# Patient Record
Sex: Male | Born: 1935
Health system: Southern US, Community
[De-identification: ages and names within clinical notes are randomized; demographics above are authoritative.]

## PROBLEM LIST (undated history)

## (undated) DIAGNOSIS — L570 Actinic keratosis: Secondary | ICD-10-CM

## (undated) DIAGNOSIS — I10 Essential (primary) hypertension: Secondary | ICD-10-CM

## (undated) DIAGNOSIS — R001 Bradycardia, unspecified: Secondary | ICD-10-CM

## (undated) DIAGNOSIS — E039 Hypothyroidism, unspecified: Secondary | ICD-10-CM

## (undated) DIAGNOSIS — D649 Anemia, unspecified: Secondary | ICD-10-CM

## (undated) DIAGNOSIS — N183 Chronic kidney disease, stage 3 unspecified: Secondary | ICD-10-CM

## (undated) DIAGNOSIS — E871 Hypo-osmolality and hyponatremia: Secondary | ICD-10-CM

## (undated) DIAGNOSIS — C642 Malignant neoplasm of left kidney, except renal pelvis: Secondary | ICD-10-CM

## (undated) DIAGNOSIS — F32A Depression, unspecified: Secondary | ICD-10-CM

## (undated) DIAGNOSIS — E785 Hyperlipidemia, unspecified: Secondary | ICD-10-CM

## (undated) DIAGNOSIS — I509 Heart failure, unspecified: Secondary | ICD-10-CM

## (undated) DIAGNOSIS — I714 Abdominal aortic aneurysm, without rupture, unspecified: Secondary | ICD-10-CM

## (undated) DIAGNOSIS — C61 Malignant neoplasm of prostate: Secondary | ICD-10-CM

## (undated) DIAGNOSIS — I4891 Unspecified atrial fibrillation: Secondary | ICD-10-CM

## (undated) DIAGNOSIS — I495 Sick sinus syndrome: Secondary | ICD-10-CM

## (undated) DIAGNOSIS — I700 Atherosclerosis of aorta: Secondary | ICD-10-CM

## (undated) DIAGNOSIS — E538 Deficiency of other specified B group vitamins: Secondary | ICD-10-CM

## (undated) DIAGNOSIS — M1990 Unspecified osteoarthritis, unspecified site: Secondary | ICD-10-CM

## (undated) HISTORY — DX: Anemia, unspecified: D64.9

## (undated) HISTORY — DX: Depression, unspecified: F32.A

## (undated) HISTORY — DX: Atherosclerosis of aorta: I70.0

## (undated) HISTORY — PX: EXCISIONAL HEMORRHOIDECTOMY: SHX1541

## (undated) HISTORY — DX: Abdominal aortic aneurysm, without rupture: I71.4

## (undated) HISTORY — DX: Chronic kidney disease, stage 3 unspecified: N18.30

## (undated) HISTORY — PX: COLONOSCOPY: SHX174

## (undated) HISTORY — PX: INSERTION PROSTATE RADIATION SEED: SUR718

## (undated) HISTORY — DX: Abdominal aortic aneurysm, without rupture, unspecified: I71.40

## (undated) HISTORY — DX: Sick sinus syndrome: I49.5

## (undated) HISTORY — DX: Unspecified atrial fibrillation: I48.91

## (undated) HISTORY — DX: Hypo-osmolality and hyponatremia: E87.1

## (undated) HISTORY — DX: Actinic keratosis: L57.0

## (undated) HISTORY — DX: Heart failure, unspecified: I50.9

## (undated) HISTORY — PX: CATARACT EXTRACTION, BILATERAL: SHX1313

## (undated) HISTORY — DX: Hypothyroidism, unspecified: E03.9

## (undated) HISTORY — DX: Chronic kidney disease, stage 3 (moderate): N18.3

---

## 1999-05-08 DIAGNOSIS — C61 Malignant neoplasm of prostate: Secondary | ICD-10-CM

## 1999-05-08 HISTORY — DX: Malignant neoplasm of prostate: C61

## 2004-06-27 ENCOUNTER — Ambulatory Visit: Payer: Self-pay

## 2008-06-15 ENCOUNTER — Ambulatory Visit: Payer: Self-pay | Admitting: Ophthalmology

## 2008-06-28 ENCOUNTER — Ambulatory Visit: Payer: Self-pay | Admitting: Ophthalmology

## 2010-07-10 ENCOUNTER — Ambulatory Visit: Payer: Self-pay | Admitting: Gastroenterology

## 2010-10-25 ENCOUNTER — Ambulatory Visit: Payer: Self-pay | Admitting: Ophthalmology

## 2010-11-06 ENCOUNTER — Ambulatory Visit: Payer: Self-pay | Admitting: Ophthalmology

## 2015-06-28 ENCOUNTER — Ambulatory Visit
Admission: RE | Admit: 2015-06-28 | Discharge: 2015-06-28 | Disposition: A | Discharge status: Home / Self Care | Payer: Medicare Other | Source: Ambulatory Visit | Attending: Infectious Diseases | Admitting: Infectious Diseases

## 2015-06-28 ENCOUNTER — Other Ambulatory Visit: Payer: Self-pay | Admitting: Infectious Diseases

## 2015-06-28 DIAGNOSIS — G252 Other specified forms of tremor: Secondary | ICD-10-CM

## 2015-06-28 DIAGNOSIS — R270 Ataxia, unspecified: Secondary | ICD-10-CM | POA: Insufficient documentation

## 2015-06-28 DIAGNOSIS — J329 Chronic sinusitis, unspecified: Secondary | ICD-10-CM | POA: Insufficient documentation

## 2015-06-29 ENCOUNTER — Encounter: Payer: Self-pay | Admitting: Internal Medicine

## 2015-06-29 ENCOUNTER — Inpatient Hospital Stay
Admission: AD | Admit: 2015-06-29 | Discharge: 2015-07-02 | DRG: 371 | Disposition: A | Discharge status: Skilled Nursing Facility | Payer: Medicare Other | Source: Ambulatory Visit | Attending: Specialist | Admitting: Specialist

## 2015-06-29 DIAGNOSIS — N170 Acute kidney failure with tubular necrosis: Secondary | ICD-10-CM | POA: Diagnosis present

## 2015-06-29 DIAGNOSIS — E785 Hyperlipidemia, unspecified: Secondary | ICD-10-CM | POA: Diagnosis present

## 2015-06-29 DIAGNOSIS — M1990 Unspecified osteoarthritis, unspecified site: Secondary | ICD-10-CM | POA: Diagnosis present

## 2015-06-29 DIAGNOSIS — Z8546 Personal history of malignant neoplasm of prostate: Secondary | ICD-10-CM | POA: Diagnosis not present

## 2015-06-29 DIAGNOSIS — E860 Dehydration: Secondary | ICD-10-CM | POA: Diagnosis present

## 2015-06-29 DIAGNOSIS — I10 Essential (primary) hypertension: Secondary | ICD-10-CM | POA: Diagnosis present

## 2015-06-29 DIAGNOSIS — R740 Nonspecific elevation of levels of transaminase and lactic acid dehydrogenase [LDH]: Secondary | ICD-10-CM | POA: Diagnosis present

## 2015-06-29 DIAGNOSIS — A0472 Enterocolitis due to Clostridium difficile, not specified as recurrent: Secondary | ICD-10-CM

## 2015-06-29 DIAGNOSIS — D72829 Elevated white blood cell count, unspecified: Secondary | ICD-10-CM

## 2015-06-29 DIAGNOSIS — N179 Acute kidney failure, unspecified: Secondary | ICD-10-CM

## 2015-06-29 DIAGNOSIS — A047 Enterocolitis due to Clostridium difficile: Secondary | ICD-10-CM | POA: Diagnosis present

## 2015-06-29 DIAGNOSIS — R7401 Elevation of levels of liver transaminase levels: Secondary | ICD-10-CM

## 2015-06-29 DIAGNOSIS — E876 Hypokalemia: Secondary | ICD-10-CM | POA: Diagnosis present

## 2015-06-29 DIAGNOSIS — E538 Deficiency of other specified B group vitamins: Secondary | ICD-10-CM | POA: Diagnosis present

## 2015-06-29 DIAGNOSIS — I4891 Unspecified atrial fibrillation: Secondary | ICD-10-CM | POA: Diagnosis not present

## 2015-06-29 DIAGNOSIS — E871 Hypo-osmolality and hyponatremia: Secondary | ICD-10-CM | POA: Diagnosis present

## 2015-06-29 DIAGNOSIS — J0100 Acute maxillary sinusitis, unspecified: Secondary | ICD-10-CM | POA: Diagnosis present

## 2015-06-29 DIAGNOSIS — E039 Hypothyroidism, unspecified: Secondary | ICD-10-CM | POA: Diagnosis present

## 2015-06-29 DIAGNOSIS — D649 Anemia, unspecified: Secondary | ICD-10-CM | POA: Diagnosis present

## 2015-06-29 DIAGNOSIS — R531 Weakness: Secondary | ICD-10-CM

## 2015-06-29 HISTORY — DX: Deficiency of other specified B group vitamins: E53.8

## 2015-06-29 HISTORY — DX: Unspecified osteoarthritis, unspecified site: M19.90

## 2015-06-29 HISTORY — DX: Essential (primary) hypertension: I10

## 2015-06-29 HISTORY — DX: Hyperlipidemia, unspecified: E78.5

## 2015-06-29 HISTORY — DX: Malignant neoplasm of prostate: C61

## 2015-06-29 LAB — GASTROINTESTINAL PANEL BY PCR, STOOL (REPLACES STOOL CULTURE)
Adenovirus F40/41: NOT DETECTED
Astrovirus: NOT DETECTED
CAMPYLOBACTER SPECIES: NOT DETECTED
CRYPTOSPORIDIUM: NOT DETECTED
Cyclospora cayetanensis: NOT DETECTED
E. coli O157: NOT DETECTED
ENTEROPATHOGENIC E COLI (EPEC): NOT DETECTED
Entamoeba histolytica: NOT DETECTED
Enteroaggregative E coli (EAEC): NOT DETECTED
Enterotoxigenic E coli (ETEC): NOT DETECTED
Giardia lamblia: NOT DETECTED
Norovirus GI/GII: NOT DETECTED
PLESIMONAS SHIGELLOIDES: NOT DETECTED
ROTAVIRUS A: NOT DETECTED
SALMONELLA SPECIES: NOT DETECTED
SHIGA LIKE TOXIN PRODUCING E COLI (STEC): NOT DETECTED
SHIGELLA/ENTEROINVASIVE E COLI (EIEC): NOT DETECTED
Sapovirus (I, II, IV, and V): NOT DETECTED
Vibrio cholerae: NOT DETECTED
Vibrio species: NOT DETECTED
YERSINIA ENTEROCOLITICA: NOT DETECTED

## 2015-06-29 LAB — BASIC METABOLIC PANEL
Anion gap: 11 (ref 5–15)
BUN: 35 mg/dL — ABNORMAL HIGH (ref 6–20)
CHLORIDE: 92 mmol/L — AB (ref 101–111)
CO2: 22 mmol/L (ref 22–32)
Calcium: 8.2 mg/dL — ABNORMAL LOW (ref 8.9–10.3)
Creatinine, Ser: 1.46 mg/dL — ABNORMAL HIGH (ref 0.61–1.24)
GFR calc non Af Amer: 41 mL/min — ABNORMAL LOW (ref >60)
GFR, EST AFRICAN AMERICAN: 48 mL/min — AB (ref >60)
Glucose, Bld: 110 mg/dL — ABNORMAL HIGH (ref 65–99)
POTASSIUM: 3.7 mmol/L (ref 3.5–5.1)
SODIUM: 125 mmol/L — AB (ref 135–145)

## 2015-06-29 LAB — CBC
HCT: 34.5 % — ABNORMAL LOW (ref 40.0–52.0)
HEMOGLOBIN: 12 g/dL — AB (ref 13.0–18.0)
MCH: 31.6 pg (ref 26.0–34.0)
MCHC: 34.8 g/dL (ref 32.0–36.0)
MCV: 90.8 fL (ref 80.0–100.0)
Platelets: 229 10*3/uL (ref 150–440)
RBC: 3.8 MIL/uL — AB (ref 4.40–5.90)
RDW: 12.7 % (ref 11.5–14.5)
WBC: 12.1 10*3/uL — ABNORMAL HIGH (ref 3.8–10.6)

## 2015-06-29 LAB — C DIFFICILE QUICK SCREEN W PCR REFLEX
C DIFFICILE (CDIFF) TOXIN: NEGATIVE
C Diff antigen: POSITIVE — AB

## 2015-06-29 LAB — CLOSTRIDIUM DIFFICILE BY PCR: Toxigenic C. Difficile by PCR: POSITIVE — AB

## 2015-06-29 MED ORDER — SODIUM CHLORIDE 0.9% FLUSH
3.0000 mL | Freq: Two times a day (BID) | INTRAVENOUS | Status: DC
  Administered 2015-06-29 – 2015-07-02 (×3): 3 mL via INTRAVENOUS

## 2015-06-29 MED ORDER — POLYETHYLENE GLYCOL 3350 17 G PO PACK: 17.0000 g | PACK | Freq: Every day | ORAL | Status: DC | PRN

## 2015-06-29 MED ORDER — ACETAMINOPHEN 650 MG RE SUPP: 650.0000 mg | Freq: Four times a day (QID) | RECTAL | Status: DC | PRN

## 2015-06-29 MED ORDER — ENOXAPARIN SODIUM 40 MG/0.4ML ~~LOC~~ SOLN
40.0000 mg | SUBCUTANEOUS | Status: DC
  Administered 2015-06-29 – 2015-07-01 (×3): 40 mg via SUBCUTANEOUS
  Filled 2015-06-29 (×3): qty 0.4

## 2015-06-29 MED ORDER — AZITHROMYCIN 250 MG PO TABS: 500.0000 mg | ORAL_TABLET | Freq: Every day | ORAL | Status: DC

## 2015-06-29 MED ORDER — SODIUM CHLORIDE 0.9 % IV SOLN
INTRAVENOUS | Status: DC
  Administered 2015-06-29 (×2): via INTRAVENOUS

## 2015-06-29 MED ORDER — ONDANSETRON HCL 4 MG/2ML IJ SOLN: 4.0000 mg | Freq: Four times a day (QID) | INTRAMUSCULAR | Status: DC | PRN

## 2015-06-29 MED ORDER — AZITHROMYCIN 250 MG PO TABS: 250.0000 mg | ORAL_TABLET | Freq: Every day | ORAL | Status: DC

## 2015-06-29 MED ORDER — ONDANSETRON HCL 4 MG PO TABS: 4.0000 mg | ORAL_TABLET | Freq: Four times a day (QID) | ORAL | Status: DC | PRN

## 2015-06-29 MED ORDER — ACETAMINOPHEN 325 MG PO TABS: 650.0000 mg | ORAL_TABLET | Freq: Four times a day (QID) | ORAL | Status: DC | PRN

## 2015-06-29 NOTE — H&P (Addendum)
[place] [facility] [place] NAME: Frank Bartlett    MR#:  [id number]  DATE OF BIRTH:  08/04/1925  DATE OF ADMISSION:  06/29/2015  PRIMARY CARE PHYSICIAN: Adrian Prows, MD   REQUESTING/REFERRING PHYSICIAN: Dr. Adrian Prows  CHIEF COMPLAINT:  No chief complaint on file.   HISTORY OF PRESENT ILLNESS:  Frank Bartlett  is a 82 y.o. male with a known history of HTN, Hyperlipidemia, arthritis sent in from PCP office secondary to significant weakness and dehydration. Sodium of 124 on labs yesterday. Symptoms started about last week, patient started feeling sick denies any fevers and chills. Nauseated and vomiting for couple of days and occasional loose stools. Generalized body aches. He has been visiting his wife who is in the Alzheimer's unit of [facility] [place]. No other sick contacts. Has been extremely weak, unable to even get up from the bed, dizzy and so went to see PCP yesterday. Labs ordered yesterday showing sodium of 124, elevated white count of 13.9 and also CT head with maxillary sinusitis. Patient was encouraged to drink more water by mouth, however he was unable to achieve that and was getting much worse. So he was asked to come in for IV fluids.  PAST MEDICAL HISTORY:   Past Medical History  Diagnosis Date  . Hypertension   . Hyperlipidemia   . Osteoarthritis   . Prostate cancer ([place])   . B12 deficiency     PAST SURGICAL HISTORY:   Past Surgical History  Procedure Laterality Date  . Cataract extraction, bilateral    . Excisional hemorrhoidectomy    . Insertion prostate radiation seed      SOCIAL HISTORY:   Social History  Substance Use Topics  . Smoking status: Never Smoker   . Smokeless tobacco: Not on file  . Alcohol Use: No    FAMILY HISTORY:   Family History  Problem Relation Age of Onset  . Hypertension Mother   . Breast cancer Mother     DRUG ALLERGIES:  No Known Allergies  REVIEW OF SYSTEMS:    Review of Systems  Constitutional: Positive for malaise/fatigue. Negative for fever, chills and weight loss.  HENT: Negative for ear discharge, ear pain, hearing loss and nosebleeds.   Eyes: Negative for blurred vision, double vision and photophobia.  Respiratory: Negative for cough, hemoptysis, shortness of breath and wheezing.   Cardiovascular: Negative for chest pain, palpitations, orthopnea and leg swelling.  Gastrointestinal: Negative for heartburn, nausea, vomiting, abdominal pain, diarrhea, constipation and melena.  Genitourinary: Negative for dysuria, urgency, frequency and hematuria.  Musculoskeletal: Positive for myalgias. Negative for back pain and neck pain.  Skin: Negative for rash.  Neurological: Positive for tremors. Negative for dizziness, tingling, sensory change, speech change, focal weakness and headaches.  Endo/Heme/Allergies: Does not bruise/bleed easily.  Psychiatric/Behavioral: Negative for depression.    MEDICATIONS AT HOME:   Prior to Admission medications   Not on File      VITAL SIGNS:  Blood pressure 90/61, pulse 85, temperature 97.6 F (36.4 C), temperature source Oral, resp. rate 20, SpO2 98 %.  PHYSICAL EXAMINATION:   Physical Exam  GENERAL:  82 y.o.-year-old patient lying in the bed with no acute distress.  EYES: Pupils equal, round, reactive to light and accommodation. No scleral icterus. Extraocular muscles intact.  HEENT: Head atraumatic, normocephalic. Oropharynx and nasopharynx clear.  NECK:  Supple, no jugular venous distention. No thyroid enlargement, no tenderness.  LUNGS: Normal breath sounds bilaterally, no wheezing, rales,rhonchi or crepitation.  No use of accessory muscles of respiration.  CARDIOVASCULAR: S1, S2 normal. No rubs, or gallops. 3/6 systolic murmur ABDOMEN: Soft, nontender, nondistended. Bowel sounds present. No organomegaly or mass.  EXTREMITIES: No pedal edema, cyanosis, or clubbing.  NEUROLOGIC: Cranial nerves II  through XII are intact. Muscle strength 5/5 in all extremities. Sensation intact. Gait not checked.  PSYCHIATRIC: The patient is alert and oriented x 3.  SKIN: No obvious rash, lesion, or ulcer.   LABORATORY PANEL:   CBC No results for input(s): WBC, HGB, HCT, PLT in the last 168 hours. ------------------------------------------------------------------------------------------------------------------  Chemistries  No results for input(s): NA, K, CL, CO2, GLUCOSE, BUN, CREATININE, CALCIUM, MG, AST, ALT, ALKPHOS, BILITOT in the last 168 hours.  Invalid input(s): GFRCGP ------------------------------------------------------------------------------------------------------------------  Cardiac Enzymes No results for input(s): TROPONINI in the last 168 hours. ------------------------------------------------------------------------------------------------------------------  RADIOLOGY:  Ct Head Wo Contrast  06/28/2015  CLINICAL DATA:  Weakness, shaking, dizziness.  Unsteady gait. EXAM: CT HEAD WITHOUT CONTRAST TECHNIQUE: Contiguous axial images were obtained from the base of the skull through the vertex without intravenous contrast. COMPARISON:  None. FINDINGS: There is atrophy and chronic small vessel disease changes. Short air-fluid level in the right maxillary sinus. Mucosal thickening in the ethmoid air cells. Mastoid air cells are clear. IMPRESSION: No acute intracranial abnormality. Atrophy, chronic microvascular disease. Mild ethmoid chronic sinusitis. Short air-fluid level in right maxillary sinus may reflect acute sinusitis. Electronically Signed   By: Rolm Baptise M.D.   On: 06/28/2015 11:09    EKG:  No orders found for this or any previous visit.  IMPRESSION AND PLAN:   Frank Bartlett  is a 82 y.o. male with a known history of HTN, Hyperlipidemia, arthritis sent in from PCP office secondary to significant weakness and dehydration. Sodium of 124 on labs yesterday.  #1 Hyponatremia-  IV fluids, hypovolemic hyponatremia Recheck Hold HCTZ  #2 ARF- pre renal, IV fluids, monitor  #2 Weakness- likely from hyponatremia, physical therapy consulted  #3 Acute right maxillary sinusitis- continue azithromycin until 07/02/15  #4 HTN- hold HCTZ as low sodium, continue losartan  #5 Hypothyroidism- on synthroid  #6 DVT prophylaxis- lovenox  Physical Therapy consulted.     All the records are reviewed and case discussed with ED provider. Management plans discussed with the patient, family and they are in agreement.  CODE STATUS: Full Code  TOTAL TIME TAKING CARE OF THIS PATIENT: 50 minutes.    Gladstone Lighter M.D on 06/29/2015 at 12:58 PM  Between 7am to 6pm - Pager - [phone]  After 6pm go to [email or web address] - password [email or web address] [facility]  [facility]  Office  [phone]  CC: Primary care physician; Adrian Prows, MD

## 2015-06-30 LAB — URINALYSIS COMPLETE WITH MICROSCOPIC (ARMC ONLY)
Bilirubin Urine: NEGATIVE
Glucose, UA: NEGATIVE mg/dL
Ketones, ur: NEGATIVE mg/dL
LEUKOCYTES UA: NEGATIVE
Nitrite: NEGATIVE
PROTEIN: 30 mg/dL — AB
SPECIFIC GRAVITY, URINE: 1.015 (ref 1.005–1.030)
pH: 6 (ref 5.0–8.0)

## 2015-06-30 LAB — COMPREHENSIVE METABOLIC PANEL
ALBUMIN: 2.6 g/dL — AB (ref 3.5–5.0)
ALT: 25 U/L (ref 17–63)
ANION GAP: 10 (ref 5–15)
AST: 72 U/L — ABNORMAL HIGH (ref 15–41)
Alkaline Phosphatase: 58 U/L (ref 38–126)
BUN: 35 mg/dL — ABNORMAL HIGH (ref 6–20)
CHLORIDE: 100 mmol/L — AB (ref 101–111)
CO2: 22 mmol/L (ref 22–32)
Calcium: 7.9 mg/dL — ABNORMAL LOW (ref 8.9–10.3)
Creatinine, Ser: 1.23 mg/dL (ref 0.61–1.24)
GFR calc non Af Amer: 51 mL/min — ABNORMAL LOW (ref >60)
GFR, EST AFRICAN AMERICAN: 59 mL/min — AB (ref >60)
GLUCOSE: 110 mg/dL — AB (ref 65–99)
Potassium: 3.3 mmol/L — ABNORMAL LOW (ref 3.5–5.1)
SODIUM: 132 mmol/L — AB (ref 135–145)
Total Bilirubin: 0.6 mg/dL (ref 0.3–1.2)
Total Protein: 6.3 g/dL — ABNORMAL LOW (ref 6.5–8.1)

## 2015-06-30 LAB — BASIC METABOLIC PANEL
Anion gap: 11 (ref 5–15)
BUN: 36 mg/dL — ABNORMAL HIGH (ref 6–20)
CHLORIDE: 97 mmol/L — AB (ref 101–111)
CO2: 21 mmol/L — ABNORMAL LOW (ref 22–32)
CREATININE: 1.28 mg/dL — AB (ref 0.61–1.24)
Calcium: 7.7 mg/dL — ABNORMAL LOW (ref 8.9–10.3)
GFR calc non Af Amer: 49 mL/min — ABNORMAL LOW (ref >60)
GFR, EST AFRICAN AMERICAN: 56 mL/min — AB (ref >60)
Glucose, Bld: 96 mg/dL (ref 65–99)
POTASSIUM: 3.1 mmol/L — AB (ref 3.5–5.1)
SODIUM: 129 mmol/L — AB (ref 135–145)

## 2015-06-30 LAB — CBC
HEMATOCRIT: 32.7 % — AB (ref 40.0–52.0)
Hemoglobin: 11.5 g/dL — ABNORMAL LOW (ref 13.0–18.0)
MCH: 32 pg (ref 26.0–34.0)
MCHC: 35.1 g/dL (ref 32.0–36.0)
MCV: 91 fL (ref 80.0–100.0)
PLATELETS: 235 10*3/uL (ref 150–440)
RBC: 3.6 MIL/uL — AB (ref 4.40–5.90)
RDW: 12.8 % (ref 11.5–14.5)
WBC: 9.5 10*3/uL (ref 3.8–10.6)

## 2015-06-30 MED ORDER — POTASSIUM CHLORIDE IN NACL 20-0.9 MEQ/L-% IV SOLN
INTRAVENOUS | Status: DC
  Administered 2015-06-30 – 2015-07-01 (×4): via INTRAVENOUS
  Filled 2015-06-30 (×5): qty 1000

## 2015-06-30 MED ORDER — LEVOTHYROXINE SODIUM 88 MCG PO TABS
88.0000 ug | ORAL_TABLET | Freq: Every day | ORAL | Status: DC
  Administered 2015-07-01 – 2015-07-02 (×2): 88 ug via ORAL
  Filled 2015-06-30 (×2): qty 1

## 2015-06-30 MED ORDER — VANCOMYCIN 50 MG/ML ORAL SOLUTION
125.0000 mg | Freq: Four times a day (QID) | ORAL | Status: DC
  Administered 2015-06-30 – 2015-07-02 (×8): 125 mg via ORAL
  Filled 2015-06-30 (×10): qty 2.5

## 2015-06-30 MED ORDER — METRONIDAZOLE 500 MG PO TABS
500.0000 mg | ORAL_TABLET | Freq: Three times a day (TID) | ORAL | Status: DC
  Administered 2015-06-30 (×2): 500 mg via ORAL
  Filled 2015-06-30 (×2): qty 1

## 2015-06-30 MED ORDER — ENSURE ENLIVE PO LIQD
237.0000 mL | Freq: Two times a day (BID) | ORAL | Status: DC
  Administered 2015-06-30 – 2015-07-02 (×4): 237 mL via ORAL

## 2015-06-30 NOTE — Progress Notes (Signed)
Initial Nutrition Assessment   INTERVENTION:   Meals and Snacks: Cater to patient preferences Medical Food Supplement Therapy: will recommend Ensure Enlive po BID, each supplement provides 350 kcal and 20 grams of protein   NUTRITION DIAGNOSIS:   Inadequate oral intake related to acute illness as evidenced by per patient/family report.  GOAL:   Patient will meet greater than or equal to 90% of their needs  MONITOR:    (Energy Intake, Electrolyte and Renal Profile, Anthropoemetrics, Digestive System)  REASON FOR ASSESSMENT:   Malnutrition Screening Tool    ASSESSMENT:   Pt admitted with hyponatremia, acute renal failure and c. diff positive.  Past Medical History  Diagnosis Date  . Hypertension   . Hyperlipidemia   . Osteoarthritis   . Prostate cancer ([place])   . B12 deficiency      Diet Order:  Diet regular Room service appropriate?: Yes; Fluid consistency:: Thin    Current Nutrition: Pt ate 40% of breakfast tray this am including all of yogurt and banana, bites of eggs, no toast, juice or milk. Pt reports not wanting to eat as it makes him have diarrhea.   Food/Nutrition-Related History: Pt reports appetite has been poor since the weekend. Pt reports previously appetite was doing ok. Pt reports drinking some of wife's left over Ensure occasionally but not consistently.    Scheduled Medications:  . enoxaparin (LOVENOX) injection  40 mg Subcutaneous Q24H  . feeding supplement (ENSURE ENLIVE)  237 mL Oral BID WC  . metroNIDAZOLE  500 mg Oral 3 times per day  . sodium chloride flush  3 mL Intravenous Q12H    Continuous Medications:  . 0.9 % NaCl with KCl 20 mEq / L 100 mL/hr at 06/30/15 0824     Electrolyte/Renal Profile and Glucose Profile:   Recent Labs Lab 06/29/15 1225 06/30/15 0502  NA 125* 129*  K 3.7 3.1*  CL 92* 97*  CO2 22 21*  BUN 35* 36*  CREATININE 1.46* 1.28*  CALCIUM 8.2* 7.7*  GLUCOSE 110* 96   Protein Profile: No results for  input(s): ALBUMIN in the last 168 hours.  Gastrointestinal Profile: Last BM: 06/30/2015  Loose diarrhea per documentation   Nutrition-Focused Physical Exam Findings:  Unable to complete Nutrition-Focused physical exam at this time.    Weight Change: Pt reports stable weight around 155-156lbs.   Skin:  Reviewed, no issues   Height:   Ht Readings from Last 1 Encounters:  06/29/15 5\' 9"  (1.753 m)    Weight:   Wt Readings from Last 1 Encounters:  06/29/15 154 lb (69.854 kg)    BMI:  Body mass index is 22.73 kg/(m^2).  Estimated Nutritional Needs:   Kcal:  BEE: 1360kcals, TEE: (IF 1.1-1.3)(AF 1.2) 1794-2120kcals  Protein:  70-84g protein (1.0-1.2g/kg)  Fluid:  1748-2033mL of fluid (25-72mL/kg)  EDUCATION NEEDS:   No education needs identified at this time   [place], RD, LDN Pager [phone] Weekend/On-Call Pager [phone]

## 2015-06-30 NOTE — Evaluation (Signed)
Physical Therapy Evaluation Patient Details Name: Frank Bartlett MRN: [id number] DOB: 05-Aug-1925 Today's Date: 06/30/2015   History of Present Illness  Pt is admitted for complaints of weakness and dehydration. Pt with history of HTN and arthritis. Pt now with positive Cdiff. Pt currently lives at [place] at [facility].   Clinical Impression  Pt is a pleasant 82 year old male who was admitted for weakness and dehydration. Pt performs bed mobility with min assist, transfers with mod assist, and ambulation with min assist and rw. Pt demonstrates poor balance with position changes and needs assist for upright posture. Pt demonstrates deficits with balance/endurance/strength. Would benefit from skilled PT to address above deficits and promote optimal return to PLOF; recommend transition to STR upon discharge from acute hospitalization.       Follow Up Recommendations SNF    Equipment Recommendations  Rolling walker with 5" wheels    Recommendations for Other Services       Precautions / Restrictions Precautions Precautions: Fall Restrictions Weight Bearing Restrictions: No      Mobility  Bed Mobility Overal bed mobility: Needs Assistance Bed Mobility: Supine to Sit     Supine to sit: Min assist     General bed mobility comments: assist for trunk mobility and scooting towards EOB. Once seated at EOB, pt able to sit with supervision  Transfers Overall transfer level: Needs assistance Equipment used: Rolling walker (2 wheeled) Transfers: Sit to/from Stand Sit to Stand: Mod assist         General transfer comment: assist for anterior translation as pt has difficulty from lower surface. Once standing, cues for upright posture.  Ambulation/Gait Ambulation/Gait assistance: Min assist Ambulation Distance (Feet): 20 Feet Assistive device: Rolling walker (2 wheeled) Gait Pattern/deviations: Step-to pattern     General Gait Details: ambulated with step to gait pattern and  slow speed. Pt able to navigate rw and obstacles, however needs assist for balance  Stairs            Wheelchair Mobility    Modified Rankin (Stroke Patients Only)       Balance                                             Pertinent Vitals/Pain Pain Assessment: No/denies pain    Home Living Family/patient expects to be discharged to:: Private residence Living Arrangements: Alone Available Help at Discharge:  (has son who is staying through the weekend) Type of Home: Independent living facility Home Access: Level entry     Home Layout: One level Home Equipment: None Additional Comments: WIfe lives at memory care unit, however is hopsitalized at this time as well    Prior Function Level of Independence: Independent               Hand Dominance        Extremity/Trunk Assessment   Upper Extremity Assessment: Generalized weakness (grossly 4/5)           Lower Extremity Assessment: Generalized weakness (grossly 4/5)         Communication   Communication: No difficulties  Cognition Arousal/Alertness: Awake/alert Behavior During Therapy: WFL for tasks assessed/performed Overall Cognitive Status: Within Functional Limits for tasks assessed                      General Comments      Exercises  Other Exercises Other Exercises: Supine ther-ex performed including B LE SLRs, hip abd/add, and seated alt. marching. All ther-ex performed x 10 reps with min assist for correct technique. Exercises written on board and reviewed with pt Other Exercises: Pt ambulated to [facility] with positive BM. Pt needs mod assist for hygiene secondary to poor balance.      Assessment/Plan    PT Assessment Patient needs continued PT services  PT Diagnosis Difficulty walking;Generalized weakness   PT Problem List Decreased strength;Decreased mobility;Decreased knowledge of use of DME;Decreased balance  PT Treatment Interventions Gait  training;Therapeutic exercise;DME instruction;Balance training   PT Goals (Current goals can be found in the Care Plan section) Acute Rehab PT Goals Patient Stated Goal: to go home PT Goal Formulation: With patient Time For Goal Achievement: 07/14/15 Potential to Achieve Goals: Good    Frequency Min 2X/week   Barriers to discharge Decreased caregiver support      Co-evaluation               End of Session Equipment Utilized During Treatment: Gait belt Activity Tolerance: Patient limited by fatigue Patient left: in chair;with chair alarm set Nurse Communication: Mobility status         Time: [id number] PT Time Calculation (min) (ACUTE ONLY): 36 min   Charges:   PT Evaluation $PT Eval Moderate Complexity: 1 Procedure PT Treatments $Therapeutic Exercise: 8-22 mins $Therapeutic Activity: 8-22 mins   PT G Codes:        Frank Bartlett 07/07/2015, 3:24 PM Greggory Stallion, PT, DPT [phone]

## 2015-06-30 NOTE — Plan of Care (Signed)
Problem: Bowel/Gastric: Goal: Will not experience complications related to bowel motility Outcome: Progressing Pt OOB to chair today, son visits, pt with no noted complaints of pain, no distress or discomfort noted

## 2015-06-30 NOTE — Clinical Social Work Note (Signed)
Clinical Social Work Assessment  Patient Details  Name: Frank Bartlett MRN: [id number] Date of Birth: 07-Dec-1925  Date of referral:  06/30/15               Reason for consult:  Facility Placement                Permission sought to share information with:  Family Supports Permission granted to share information::  Yes, Verbal Permission Granted  Name::     son, [name]   Housing/Transportation Living arrangements for the past 2 months:  [facility]) Source of Information:  Patient, Adult Children Patient Interpreter Needed:  None Criminal Activity/Legal Involvement Pertinent to Current Situation/Hospitalization:  No - Comment as needed Significant Relationships:  Adult Children, Spouse Lives with:  Self Do you feel safe going back to the place where you live?  Yes Need for family participation in patient care:  No (Coment)  Care giving concerns:  Pt lives alone and adult children live out of state.   Social Worker assessment / plan:  CSW met with pt to address consult. CSW introduced herself and explained role of social work. PT eval is pending. Pt lives at [facility] in the [place]. Pt has [facility] at [facility] for almost [facility] years. Pt's wife now lives at the [place] at [facility]. CSW explained that [facility] at [facility] if it is needed. CSW will continue to follow.   Employment status:  Retired Nurse, adult PT Recommendations:  Not assessed at this time Information / Referral to community resources:  Other (Comment Required) [facility])  Patient/Family's Response to care:  Pt was appreciative of CSW support.   Patient/Family's Understanding of and Emotional Response to Diagnosis, Current Treatment, and Prognosis:   Pt understands that he should go to [facility] should he need it.   Emotional Assessment Appearance:  Appears stated age Attitude/Demeanor/Rapport:  Other  (Appropriate) Affect (typically observed):  Accepting, Adaptable, Pleasant Orientation:  Oriented to Self, Oriented to Place, Oriented to  Time, Oriented to Situation Alcohol / Substance use:  Never Used Psych involvement (Current and /or in the community):  No (Comment)  Discharge Needs  Concerns to be addressed:  Adjustment to Illness Readmission within the last 30 days:  No Current discharge risk:  None Barriers to Discharge:  Continued Medical Work up   [employer], LCSW 06/30/2015, 3:48 PM

## 2015-06-30 NOTE — Progress Notes (Signed)
[place] at [place] NAME: Frank Bartlett    MR#:  [id number]  DATE OF BIRTH:  1925-06-18  SUBJECTIVE:  CHIEF COMPLAINT:  No chief complaint on file.  patient is a 82 year old male with known history of essential hypertension, hyperlipidemia, arthritis, who was sent from primary care physician's office due to weakness and dehydration, his sodium level was found to be low at 124. He admitted of intermittent diarrhea. Patient was initiated on IV fluids and his sodium level improved to 129, he feels a little bit more comfortable today, his kidney function also has improved. His stool was checked for C. difficile and it was positive, patient was initiated on Flagyl and enteric precautions.  Patient admits of lower abdominal pain now with defecation.  Review of Systems  Gastrointestinal: Positive for abdominal pain and diarrhea.    VITAL SIGNS: Blood pressure 117/69, pulse 67, temperature 98.6 F (37 C), temperature source Oral, resp. rate 18, height 5\' 9"  (1.753 m), weight 69.854 kg (154 lb), SpO2 96 %.  PHYSICAL EXAMINATION:   GENERAL:  82 y.o.-year-old patient lying in the bed with no acute distress.  EYES: Pupils equal, round, reactive to light and accommodation. No scleral icterus. Extraocular muscles intact.  HEENT: Head atraumatic, normocephalic. Oropharynx and nasopharynx clear.  NECK:  Supple, no jugular venous distention. No thyroid enlargement, no tenderness.  LUNGS: Normal breath sounds bilaterally, no wheezing, rales,rhonchi or crepitation. No use of accessory muscles of respiration.  CARDIOVASCULAR: S1, S2 normal. No murmurs, rubs, or gallops.  ABDOMEN: Soft, positive discomfort on palpation in lower abdomen but no rebound or guarding was noted, nondistended. Bowel sounds present. No organomegaly or mass.  EXTREMITIES: No pedal edema, cyanosis, or clubbing.  NEUROLOGIC: Cranial nerves II through XII are intact. Muscle strength  5/5 in all extremities. Sensation intact. Gait not checked.  PSYCHIATRIC: The patient is alert and oriented x 3.  SKIN: No obvious rash, lesion, or ulcer.   ORDERS/RESULTS REVIEWED:   CBC  Recent Labs Lab 06/29/15 1225 06/30/15 0502  WBC 12.1* 9.5  HGB 12.0* 11.5*  HCT 34.5* 32.7*  PLT 229 235  MCV 90.8 91.0  MCH 31.6 32.0  MCHC 34.8 35.1  RDW 12.7 12.8   ------------------------------------------------------------------------------------------------------------------  Chemistries   Recent Labs Lab 06/29/15 1225 06/30/15 0502  NA 125* 129*  K 3.7 3.1*  CL 92* 97*  CO2 22 21*  GLUCOSE 110* 96  BUN 35* 36*  CREATININE 1.46* 1.28*  CALCIUM 8.2* 7.7*   ------------------------------------------------------------------------------------------------------------------ estimated creatinine clearance is 40.2 mL/min (by C-G formula based on Cr of 1.28). ------------------------------------------------------------------------------------------------------------------ No results for input(s): TSH, T4TOTAL, T3FREE, THYROIDAB in the last 72 hours.  Invalid input(s): FREET3  Cardiac Enzymes No results for input(s): CKMB, TROPONINI, MYOGLOBIN in the last 168 hours.  Invalid input(s): CK ------------------------------------------------------------------------------------------------------------------ Invalid input(s): POCBNP ---------------------------------------------------------------------------------------------------------------  RADIOLOGY: No results found.  EKG:  Orders placed or performed during the hospital encounter of 06/29/15  . EKG 12-Lead  . EKG 12-Lead    ASSESSMENT AND PLAN:  Active Problems:   Hyponatremia  #1. Hyponatremia, likely due to dehydration, improved with IV fluid administration, continue to follow. #2. Renal insufficiency, acute, continue patient on IV fluids, follow kidney function closely, improving. Get urinalysis #3. C. difficile  enterocolitis, initiate patient on Flagyl, follow patient clinically.   #4 anemia, get Hemoccult, followed with rehydration, no bleeding noted. #5. Generalized weakness. Get physical therapist involved for further recommendations  Management plans discussed with the patient,  family and they are in agreement.   DRUG ALLERGIES: No Known Allergies  CODE STATUS:     Code Status Orders        Start     Ordered   06/29/15 1207  Full code   Continuous     06/29/15 1206    Code Status History    Date Active Date Inactive Code Status Order ID Comments User Context   This patient has a current code status but no historical code status.    Advance Directive Documentation        Most Recent Value   Type of Advance Directive  Healthcare Power of Attorney, Living will   Pre-existing out of facility DNR order (yellow form or pink MOST form)     "MOST" Form in Place?        TOTAL TIME TAKING CARE OF THIS PATIENT: 40 minutes.    Theodoro Grist M.D on 06/30/2015 at 12:59 PM  Between 7am to 6pm - Pager - [phone]  After 6pm go to [email or web address] - password [email or web address] [facility]  [facility]  Office  [phone]  CC: Primary care physician; Adrian Prows, MD

## 2015-06-30 NOTE — Clinical Social Work Note (Addendum)
Clinical Socail Worker consulted for new SNF. PT eval is pending. Pt is a resident of [facility]. CSW spoke with facility and a room will be available at discharge if it is needed. Full assessment to follow if appropriate. CSW will continue to follow.   [place] Dates, MSW, LCSW Clinical Social Worker  [phone]  ADDENDUM: CSW updated pt's son on disposition. Pt's son shared that he will be following up with [facility] regarding home care options that facility could provide while at the independent living.  [place] Dates, MSW, LCSW

## 2015-06-30 NOTE — Plan of Care (Signed)
Problem: Bowel/Gastric: Goal: Will not experience complications related to bowel motility Outcome: Not Progressing C. Dif +

## 2015-06-30 NOTE — Care Management (Signed)
Admitted to this facility with the diagnosis of hyponatremia. Seen Dr. Ola Spurr yesterday and was admitted to oncology unit. Lives alone at the [place] at [facility]. Wife is in [place] [facility] at [facility]. Son is [name] [phone]). Takes care of basic and instrumental activities of daily living himself.  Sodium level 129 today. Physical therapy evaluation pending. Shelbie Ammons RN MSN CCM Care Management [phone]

## 2015-07-01 DIAGNOSIS — R7401 Elevation of levels of liver transaminase levels: Secondary | ICD-10-CM

## 2015-07-01 DIAGNOSIS — R531 Weakness: Secondary | ICD-10-CM

## 2015-07-01 DIAGNOSIS — D72829 Elevated white blood cell count, unspecified: Secondary | ICD-10-CM

## 2015-07-01 DIAGNOSIS — R740 Nonspecific elevation of levels of transaminase and lactic acid dehydrogenase [LDH]: Secondary | ICD-10-CM

## 2015-07-01 DIAGNOSIS — A0472 Enterocolitis due to Clostridium difficile, not specified as recurrent: Secondary | ICD-10-CM

## 2015-07-01 DIAGNOSIS — N179 Acute kidney failure, unspecified: Secondary | ICD-10-CM

## 2015-07-01 DIAGNOSIS — D649 Anemia, unspecified: Secondary | ICD-10-CM

## 2015-07-01 DIAGNOSIS — E876 Hypokalemia: Secondary | ICD-10-CM

## 2015-07-01 LAB — SODIUM: Sodium: 129 mmol/L — ABNORMAL LOW (ref 135–145)

## 2015-07-01 LAB — HEMOGLOBIN: Hemoglobin: 11.5 g/dL — ABNORMAL LOW (ref 13.0–18.0)

## 2015-07-01 LAB — OSMOLALITY, URINE: Osmolality, Ur: 630 mOsm/kg (ref 300–900)

## 2015-07-01 LAB — MAGNESIUM: MAGNESIUM: 2 mg/dL (ref 1.7–2.4)

## 2015-07-01 MED ORDER — ONDANSETRON HCL 4 MG PO TABS: 4.0000 mg | ORAL_TABLET | Freq: Four times a day (QID) | ORAL | Status: DC | PRN

## 2015-07-01 MED ORDER — ENSURE ENLIVE PO LIQD: 237.0000 mL | Freq: Two times a day (BID) | ORAL | Status: DC

## 2015-07-01 MED ORDER — VANCOMYCIN 50 MG/ML ORAL SOLUTION: 125.0000 mg | Freq: Four times a day (QID) | ORAL | Status: DC

## 2015-07-01 MED ORDER — POTASSIUM CHLORIDE CRYS ER 20 MEQ PO TBCR
40.0000 meq | EXTENDED_RELEASE_TABLET | Freq: Once | ORAL | Status: AC
  Administered 2015-07-01: 40 meq via ORAL
  Filled 2015-07-01: qty 2

## 2015-07-01 NOTE — NC FL2 (Signed)
[place] LEVEL OF CARE SCREENING TOOL     IDENTIFICATION  Patient Name: Frank Bartlett Birthdate: 10-07-1925 Sex: male Admission Date (Current Location): 06/29/2015  [place] and [place] Number:  Engineering geologist and Address:  [facility], [address], [place], [place] [address]      Provider Number: [id number]  Attending Physician Name and Address:  Theodoro Grist, MD  Relative Name and Phone Number:       Current Level of Care: SNF Recommended Level of Care: [place] Prior Approval Number:    Date Approved/Denied:   PASRR Number: [id number]  Discharge Plan: SNF    Current Diagnoses: Patient Active Problem List   Diagnosis Date Noted  . Acute renal failure ([place]) 07/01/2015  . C. difficile colitis 07/01/2015  . Generalized weakness 07/01/2015  . Hypokalemia 07/01/2015  . Elevated transaminase level 07/01/2015  . Leukocytosis 07/01/2015  . Anemia 07/01/2015  . Hyponatremia 06/29/2015    Orientation RESPIRATION BLADDER Height & Weight     Self, Time, Situation, Place  Normal Continent Weight: 154 lb (69.854 kg) Height:  5\' 9"  (175.3 cm)  BEHAVIORAL SYMPTOMS/MOOD NEUROLOGICAL BOWEL NUTRITION STATUS      Continent Diet (Regular Diet, Thin Liquids)  AMBULATORY STATUS COMMUNICATION OF NEEDS Skin   Limited Assist Verbally Normal                       Personal Care Assistance Level of Assistance  Bathing, Feeding, Dressing Bathing Assistance: Limited assistance Feeding assistance: Independent Dressing Assistance: Limited assistance     Functional Limitations Info  Sight, Hearing, Speech Sight Info: Adequate Hearing Info: Adequate Speech Info: Adequate    SPECIAL CARE FACTORS FREQUENCY  PT (By licensed PT)     PT Frequency: 5              Contractures      Additional Factors Info  Code Status, Allergies Code Status Info: Full Code Allergies Info: Allergies            Current Medications (07/01/2015):  This is the current hospital active medication list Current Facility-Administered Medications  Medication Dose Route Frequency Provider Last Rate Last Dose  . acetaminophen (TYLENOL) tablet 650 mg  650 mg Oral Q6H PRN Gladstone Lighter, MD       Or  . acetaminophen (TYLENOL) suppository 650 mg  650 mg Rectal Q6H PRN Gladstone Lighter, MD      . enoxaparin (LOVENOX) injection 40 mg  40 mg Subcutaneous Q24H Gladstone Lighter, MD   40 mg at 06/30/15 2109  . feeding supplement (ENSURE ENLIVE) (ENSURE ENLIVE) liquid 237 mL  237 mL Oral BID WC Theodoro Grist, MD   237 mL at 07/01/15 0826  . levothyroxine (SYNTHROID, LEVOTHROID) tablet 88 mcg  88 mcg Oral QAC breakfast Theodoro Grist, MD   88 mcg at 07/01/15 0829  . ondansetron (ZOFRAN) tablet 4 mg  4 mg Oral Q6H PRN Gladstone Lighter, MD       Or  . ondansetron (ZOFRAN) injection 4 mg  4 mg Intravenous Q6H PRN Gladstone Lighter, MD      . sodium chloride flush (NS) 0.9 % injection 3 mL  3 mL Intravenous Q12H Gladstone Lighter, MD   3 mL at 06/29/15 2204  . vancomycin (VANCOCIN) 50 mg/mL oral solution 125 mg  125 mg Oral QID Theodoro Grist, MD   125 mg at 07/01/15 1358     Discharge Medications: Please see discharge summary  for a list of discharge medications.  Relevant Imaging Results:  Relevant Lab Results:   Additional Information SSN:  [id number]  Darden Dates, LCSW

## 2015-07-01 NOTE — Care Management Important Message (Signed)
Important Message  Patient Details  Name: Frank Bartlett MRN: [id number] Date of Birth: [birth year]   Medicare Important Message Given:  Yes    Shelbie Ammons, RN 07/01/2015, 11:32 AM

## 2015-07-01 NOTE — Progress Notes (Signed)
Dr Ether Griffins made aware of Na 129, new order to cancel discharge

## 2015-07-01 NOTE — Discharge Summary (Addendum)
[place] at [place] NAME: Frank Bartlett    MR#:  [id number]  DATE OF BIRTH:  [birth year]  DATE OF ADMISSION:  06/29/2015 ADMITTING PHYSICIAN: Gladstone Lighter, MD  DATE OF DISCHARGE: 07/02/2015  PRIMARY CARE PHYSICIAN: Adrian Prows, MD     ADMISSION DIAGNOSIS:  Hyponatremia ARF  DISCHARGE DIAGNOSIS:  Principal Problem:   Hyponatremia Active Problems:   Acute renal failure (HCC)   C. difficile colitis   Generalized weakness   Hypokalemia   Elevated transaminase level   Leukocytosis   Anemia   SECONDARY DIAGNOSIS:   Past Medical History  Diagnosis Date  . Hypertension   . Hyperlipidemia   . Osteoarthritis   . Prostate cancer ([place])   . B12 deficiency    HOSPITAL COURSE:   The patient is a 82 year old male with known history of essential hypertension, hyperlipidemia, arthritis, who was sent from primary care physician's office due to weakness and dehydration and noted to be pretty treatment can also noted to have C. difficile colitis.   #1. Hyponatremia-this was due to dehydration from C. Diff colitis and diarrhea. It has improved with IV fluid administration.   - sodium today is 131 and pt. Is clinically asymptomatic and will be discharged to SNF.   #2. Renal insufficiency, acute - ATN due to dehydration and C. Diff colitis.  - resolved w/ IV fluids.  #3. C. difficile enterocolitis - much improved and pt. Diarrhea is less frequent now.  - He is tolerating a regular diet well. He will continue vancomycin for 14 days orally to finish treatment  #4 anemia - Hemoccult is negative. Hemoglobin currently stable. Can be further followed as an outpatient.  #5. Generalized weakness-due to the deconditioning and underlying C. difficile colitis. Patient was seen by physical therapy and recommended short-term rehabilitation which is very he's being discharged presently.  #6. Leukocytosis, resolved with antibiotic  therapy.  #7. Hypokalemia-due to diarrhea and it was supplemented and resolved now.  #8 Hypothyroidism - pt. Will resume his synthroid.   DISCHARGE CONDITIONS:   Stable  CONSULTS OBTAINED:  Treatment Team:  Henreitta Leber, MD  DRUG ALLERGIES:  No Known Allergies  DISCHARGE MEDICATIONS:   Current Discharge Medication List    START taking these medications   Details  feeding supplement, ENSURE ENLIVE, (ENSURE ENLIVE) LIQD Take 237 mLs by mouth 2 (two) times daily with a meal. Qty: 237 mL, Refills: 12    ondansetron (ZOFRAN) 4 MG tablet Take 1 tablet (4 mg total) by mouth every 6 (six) hours as needed for nausea. Qty: 20 tablet, Refills: 0    vancomycin (VANCOCIN) 50 mg/mL oral solution Take 2.5 mLs (125 mg total) by mouth 4 (four) times daily. Qty: 140 mL, Refills: 0      CONTINUE these medications which have NOT CHANGED   Details  aspirin 81 MG tablet Take 81 mg by mouth daily.    levothyroxine (SYNTHROID, LEVOTHROID) 88 MCG tablet Take 88 mcg by mouth daily before breakfast.    vitamin B-12 (CYANOCOBALAMIN) 1000 MCG tablet Take 1,000 mcg by mouth daily.      STOP taking these medications     azithromycin (ZITHROMAX) 250 MG tablet      lisinopril-hydrochlorothiazide (PRINZIDE,ZESTORETIC) 20-12.5 MG tablet          DISCHARGE INSTRUCTIONS:    Patient is to follow-up with primary care physician, Dr. Ola Spurr within 1 week after discharge  If you experience worsening of your admission symptoms, develop  shortness of breath, life threatening emergency, suicidal or homicidal thoughts you must seek medical attention immediately by calling 911 or calling your MD immediately  if symptoms less severe.  You Must read complete instructions/literature along with all the possible adverse reactions/side effects for all the Medicines you take and that have been prescribed to you. Take any new Medicines after you have completely understood and accept all the possible  adverse reactions/side effects.   Please note  You were cared for by a hospitalist during your hospital stay. If you have any questions about your discharge medications or the care you received while you were in the hospital after you are discharged, you can call the unit and asked to speak with the hospitalist on call if the hospitalist that took care of you is not available. Once you are discharged, your primary care physician will handle any further medical issues. Please note that NO REFILLS for any discharge medications will be authorized once you are discharged, as it is imperative that you return to your primary care physician (or establish a relationship with a primary care physician if you do not have one) for your aftercare needs so that they can reassess your need for medications and monitor your lab values.    Today   Diarrhea has improved.  No N/V or fever. Tolerating regular diet well.   VITAL SIGNS:  Blood pressure 136/80, pulse 69, temperature 98.8 F (37.1 C), temperature source Oral, resp. rate 16, height 5\' 9"  (1.753 m), weight 69.854 kg (154 lb), SpO2 94 %.  I/O:    Intake/Output Summary (Last 24 hours) at 07/02/15 1231 Last data filed at 07/01/15 1817  Gross per 24 hour  Intake      0 ml  Output    400 ml  Net   -400 ml    PHYSICAL EXAMINATION:  GENERAL:  82 y.o.-year-old patient lying in the bed with no acute distress.  EYES: Pupils equal, round, reactive to light and accommodation. No scleral icterus. Extraocular muscles intact.  HEENT: Head atraumatic, normocephalic. Oropharynx and nasopharynx clear.  NECK:  Supple, no jugular venous distention. No thyroid enlargement, no tenderness.  LUNGS: Normal breath sounds bilaterally, no wheezing, rales,rhonchi or crepitation. No use of accessory muscles of respiration.  CARDIOVASCULAR: S1, S2 normal. No murmurs, rubs, or gallops.  ABDOMEN: Soft, non-tender, non-distended. Bowel sounds present. No organomegaly or mass.   EXTREMITIES: No pedal edema, cyanosis, or clubbing.  NEUROLOGIC: Cranial nerves II through XII are intact. No focal motor or sensory deficits appreciated bilaterally. Globally weak.  PSYCHIATRIC: The patient is alert and oriented x 3.  SKIN: No obvious rash, lesion, or ulcer.   DATA REVIEW:   CBC  Recent Labs Lab 06/30/15 0502 07/01/15 0508  WBC 9.5  --   HGB 11.5* 11.5*  HCT 32.7*  --   PLT 235  --     Chemistries   Recent Labs Lab 06/30/15 1809 07/01/15 0508  07/02/15 0443  NA 132*  --   < > 131*  K 3.3*  --   --  3.6  CL 100*  --   --  104  CO2 22  --   --  20*  GLUCOSE 110*  --   --  103*  BUN 35*  --   --  20  CREATININE 1.23  --   --  0.94  CALCIUM 7.9*  --   --  7.6*  MG  --  2.0  --   --  AST 72*  --   --   --   ALT 25  --   --   --   ALKPHOS 58  --   --   --   BILITOT 0.6  --   --   --   < > = values in this interval not displayed.  Cardiac Enzymes No results for input(s): TROPONINI in the last 168 hours.  Microbiology Results  Results for orders placed or performed during the hospital encounter of 06/29/15  Gastrointestinal Panel by PCR , Stool     Status: None   Collection Time: 06/29/15  3:47 PM  Result Value Ref Range Status   Campylobacter species NOT DETECTED NOT DETECTED Final   Plesimonas shigelloides NOT DETECTED NOT DETECTED Final   Salmonella species NOT DETECTED NOT DETECTED Final   Yersinia enterocolitica NOT DETECTED NOT DETECTED Final   Vibrio species NOT DETECTED NOT DETECTED Final   Vibrio cholerae NOT DETECTED NOT DETECTED Final   Enteroaggregative E coli (EAEC) NOT DETECTED NOT DETECTED Final   Enteropathogenic E coli (EPEC) NOT DETECTED NOT DETECTED Final   Enterotoxigenic E coli (ETEC) NOT DETECTED NOT DETECTED Final   Shiga like toxin producing E coli (STEC) NOT DETECTED NOT DETECTED Final   E. coli O157 NOT DETECTED NOT DETECTED Final   Shigella/Enteroinvasive E coli (EIEC) NOT DETECTED NOT DETECTED Final    Cryptosporidium NOT DETECTED NOT DETECTED Final   Cyclospora cayetanensis NOT DETECTED NOT DETECTED Final   Entamoeba histolytica NOT DETECTED NOT DETECTED Final   Giardia lamblia NOT DETECTED NOT DETECTED Final   Adenovirus F40/41 NOT DETECTED NOT DETECTED Final   Astrovirus NOT DETECTED NOT DETECTED Final   Norovirus GI/GII NOT DETECTED NOT DETECTED Final   Rotavirus A NOT DETECTED NOT DETECTED Final   Sapovirus (I, II, IV, and V) NOT DETECTED NOT DETECTED Final  C difficile quick scan w PCR reflex     Status: Abnormal   Collection Time: 06/29/15  3:47 PM  Result Value Ref Range Status   C Diff antigen POSITIVE (A) NEGATIVE Final   C Diff toxin NEGATIVE NEGATIVE Final   C Diff interpretation   Final    Positive for toxigenic C. difficile, active toxin production not detected. Patient has toxigenic C. difficile organisms present in the bowel, but toxin was not detected. The patient may be a carrier or the level of toxin in the sample was below the limit  of detection. This information should be used in conjunction with the patient's clinical history when deciding on possible therapy.   Clostridium Difficile by PCR     Status: Abnormal   Collection Time: 06/29/15  3:47 PM  Result Value Ref Range Status   Toxigenic C Difficile by pcr POSITIVE (A) NEGATIVE Final    Comment: CRITICAL RESULT CALLED TO, READ BACK BY AND VERIFIED WITH: CAROLINE SONBAY ON 06/29/15 AT 2142 BY TLB     RADIOLOGY:  No results found.  EKG:   Orders placed or performed during the hospital encounter of 06/29/15  . EKG 12-Lead  . EKG 12-Lead      Management plans discussed with the patient, family and they are in agreement.  CODE STATUS:     Code Status Orders        Start     Ordered   06/29/15 1207  Full code   Continuous     06/29/15 1206    Code Status History    Date Active Date Inactive Code Status Order  ID Comments User Context   This patient has a current code status but no historical  code status.    Advance Directive Documentation        Most Recent Value   Type of Advance Directive  Healthcare Power of Attorney, Living will   Pre-existing out of facility DNR order (yellow form or pink MOST form)     "MOST" Form in Place?        TOTAL TIME TAKING CARE OF THIS PATIENT: 40 minutes.    Henreitta Leber M.D on 07/02/2015 at 12:31 PM  Between 7am to 6pm - Pager - [phone]  After 6pm go to [email or web address] - password [email or web address] [email or web address]  [facility]  Office  [phone]  CC: Primary care physician; Adrian Prows, MD

## 2015-07-01 NOTE — Clinical Social Work Note (Signed)
Pt will discharge to [facility] when medically stable. CSW updated admissions coordinator. Pt may discharge over the weekend. Facility has discharge summary in anticipation for a weekend discharge. Pt and son are aware and agreeable to discharge plan. CSW will continue to follow.   Darden Dates, MSW, LCSW Clinical Social Worker  [phone]

## 2015-07-01 NOTE — Plan of Care (Signed)
Problem: Bowel/Gastric: Goal: Will not experience complications related to bowel motility Outcome: Progressing Pt Na 129 continue to monitor labs, possible discharge tomorrow pending labs, pt appetite improving, states less frequent diarrhea, states that he is feeling better

## 2015-07-02 LAB — BASIC METABOLIC PANEL
Anion gap: 7 (ref 5–15)
BUN: 20 mg/dL (ref 6–20)
CHLORIDE: 104 mmol/L (ref 101–111)
CO2: 20 mmol/L — AB (ref 22–32)
CREATININE: 0.94 mg/dL (ref 0.61–1.24)
Calcium: 7.6 mg/dL — ABNORMAL LOW (ref 8.9–10.3)
GFR calc Af Amer: >60 mL/min (ref >60)
GFR calc non Af Amer: >60 mL/min (ref >60)
GLUCOSE: 103 mg/dL — AB (ref 65–99)
POTASSIUM: 3.6 mmol/L (ref 3.5–5.1)
SODIUM: 131 mmol/L — AB (ref 135–145)

## 2015-07-02 NOTE — Progress Notes (Signed)
Patient has orders for d/c to SNF. Patient will be discharge to [facility] Dr Verdell Carmine completed and signed d/c summary and order. All d/c information faxed to facility.  CSW was given number for report and room number. Patient leaving by EMS. SW completed EMS packet and placed hard scripts in packet. RN Estill Bamberg was notified that packet was ready and placed next to patients chart. Nurse was provided Rm # [phone] and contact # for report [phone] . Patient is alert and oriented, CSW notified patient and son [name] at bedside of discharge. No further identified needs.   Anitra Lauth, BSW, MSW, [place] Work Dept [phone]

## 2015-07-02 NOTE — Progress Notes (Signed)
Pt being discharged [facility] at this time via EMS, no noted complaints at discharge, son at bedside

## 2015-07-02 NOTE — Clinical Social Work Placement (Signed)
   CLINICAL SOCIAL WORK PLACEMENT  NOTE  Date:  07/02/2015  Patient Details  Name: Frank Bartlett MRN: [id number] Date of Birth: [birth year]  Clinical Social Work is seeking post-discharge placement for this patient at the [place] level of care (*CSW will initial, date and re-position this form in  chart as items are completed):  Yes   Patient/family provided with [facility] Work Department's list of facilities offering this level of care within the geographic area requested by the patient (or if unable, by the patient's family).  Yes   Patient/family informed of their freedom to choose among providers that offer the needed level of care, that participate in Medicare, Medicaid or managed care program needed by the patient, have an available bed and are willing to accept the patient.  Yes   Patient/family informed of [facility]'s ownership interest in [facility] and [facility], as well as of the fact that they are under no obligation to receive care at these facilities.  PASRR submitted to EDS on 07/01/15     PASRR number received on 07/01/15     Existing PASRR number confirmed on       FL2 transmitted to all facilities in geographic area requested by pt/family on 07/01/15     FL2 transmitted to all facilities within larger geographic area on       Patient informed that his/her managed care company has contracts with or will negotiate with certain facilities, including the following:        Yes   Patient/family informed of bed offers received.  Patient chooses bed at [facility]     Physician recommends and patient chooses bed at  [facility])    Patient to be transferred to  [facility]) on 07/02/15.  Patient to be transferred to facility by  (EMS)     Patient family notified on 07/02/15 of transfer.  Name of family member notified:   [name] (Son) [phone])     PHYSICIAN       Additional Comment:     _______________________________________________ Micah Flesher, LCSW 07/02/2015, 2:13 PM

## 2015-07-02 NOTE — Progress Notes (Signed)
Pt being discharged to [facility], report called to [place], EMS called for transport, pt with no noted complaints

## 2015-07-05 DIAGNOSIS — I10 Essential (primary) hypertension: Secondary | ICD-10-CM

## 2015-07-05 DIAGNOSIS — E039 Hypothyroidism, unspecified: Secondary | ICD-10-CM

## 2015-07-05 DIAGNOSIS — E871 Hypo-osmolality and hyponatremia: Secondary | ICD-10-CM

## 2015-07-05 DIAGNOSIS — A047 Enterocolitis due to Clostridium difficile: Secondary | ICD-10-CM

## 2015-07-12 NOTE — Progress Notes (Signed)
[place] at [place] NAME: Frank Bartlett    MR#:  [id number]  DATE OF BIRTH:  05-Mar-1926  SUBJECTIVE:  CHIEF COMPLAINT:  No chief complaint on file.  patient is a 82 year old male with known history of essential hypertension, hyperlipidemia, arthritis, who was sent from primary care physician's office due to weakness and dehydration, his sodium level was found to be low at 124. He admitted of intermittent diarrhea. Patient was initiated on IV fluids and his sodium level improved to 132, but with continuation of IVF his Na decreased again to 129, signifying likely SIADH, the patient feels a better  today, diarrhea subsided on vancomycin , kidney function improved.   Patient still c/o of lower abdominal pain with defecation, especially with palpation.   Review of Systems  Constitutional: Negative for fever, chills and weight loss.  HENT: Negative for congestion.   Eyes: Negative for blurred vision and double vision.  Respiratory: Negative for cough, sputum production, shortness of breath and wheezing.   Cardiovascular: Negative for chest pain, palpitations, orthopnea, leg swelling and PND.  Gastrointestinal: Positive for abdominal pain and diarrhea. Negative for nausea, vomiting, constipation and blood in stool.  Genitourinary: Negative for dysuria, urgency, frequency and hematuria.  Musculoskeletal: Negative for falls.  Neurological: Negative for dizziness, tremors, focal weakness and headaches.  Endo/Heme/Allergies: Does not bruise/bleed easily.  Psychiatric/Behavioral: Negative for depression. The patient does not have insomnia.     VITAL SIGNS: Blood pressure 143/84, pulse 86, temperature 99 F (37.2 C), temperature source Oral, resp. rate 22, height 5\' 9"  (1.753 m), weight 69.854 kg (154 lb), SpO2 97 %.  PHYSICAL EXAMINATION:   GENERAL:  82 y.o.-year-old patient lying in the bed with no acute distress.  EYES: Pupils equal,  round, reactive to light and accommodation. No scleral icterus. Extraocular muscles intact.  HEENT: Head atraumatic, normocephalic. Oropharynx and nasopharynx clear.  NECK:  Supple, no jugular venous distention. No thyroid enlargement, no tenderness.  LUNGS: Normal breath sounds bilaterally, no wheezing, rales,rhonchi or crepitation. No use of accessory muscles of respiration.  CARDIOVASCULAR: S1, S2 normal. No murmurs, rubs, or gallops.  ABDOMEN: Soft, LLQ discomfort on palpation  but no rebound or guarding was noted, nondistended. Bowel sounds present. No organomegaly or mass.  EXTREMITIES: No pedal edema, cyanosis, or clubbing.  NEUROLOGIC: Cranial nerves II through XII are intact. Muscle strength 5/5 in all extremities. Sensation intact. Gait not checked.  PSYCHIATRIC: The patient is alert and oriented x 3.  SKIN: No obvious rash, lesion, or ulcer.   ORDERS/RESULTS REVIEWED:   CBC No results for input(s): WBC, HGB, HCT, PLT, MCV, MCH, MCHC, RDW, LYMPHSABS, MONOABS, EOSABS, BASOSABS, BANDABS in the last 168 hours.  Invalid input(s): NEUTRABS, BANDSABD ------------------------------------------------------------------------------------------------------------------  Chemistries  No results for input(s): NA, K, CL, CO2, GLUCOSE, BUN, CREATININE, CALCIUM, MG, AST, ALT, ALKPHOS, BILITOT in the last 168 hours.  Invalid input(s): GFRCGP ------------------------------------------------------------------------------------------------------------------ estimated creatinine clearance is 54.7 mL/min (by C-G formula based on Cr of 0.94). ------------------------------------------------------------------------------------------------------------------ No results for input(s): TSH, T4TOTAL, T3FREE, THYROIDAB in the last 72 hours.  Invalid input(s): FREET3  Cardiac Enzymes No results for input(s): CKMB, TROPONINI, MYOGLOBIN in the last 168 hours.  Invalid input(s):  CK ------------------------------------------------------------------------------------------------------------------ Invalid input(s): POCBNP ---------------------------------------------------------------------------------------------------------------  RADIOLOGY: No results found.  EKG:  Orders placed or performed during the hospital encounter of 06/29/15  . EKG 12-Lead  . EKG 12-Lead    ASSESSMENT AND PLAN:  Principal Problem:   Hyponatremia Active Problems:  C. difficile colitis   Acute renal failure (HCC)   Generalized weakness   Hypokalemia   Leukocytosis   Elevated transaminase level   Anemia  #1. Hyponatremia, likely due to dehydration and SIADH, initially  improved with IV fluid administration, then worsened, getting urine osmolality to confirm, stopping IVF, initiating fluid restrictions, continue to follow Na level. #2. Renal insufficiency, acute, improved on IV fluids, now will be on fluid restrictions, follow kidney function closely. Urinalysis was unremarkable, unlikely infection #3. C. difficile enterocolitis, continue Vancomycin, diarrhea subsided,  Improving  clinically.   #4 anemia, Hemoccult ordered, not received so far, stable with rehydration, no bleeding noted. #5. Generalized weakness. Physical therapist recommended SNF placement, getting SW involved in bed search, possibly dc tomorrow.   Management plans discussed with the patient, family and they are in agreement.   DRUG ALLERGIES: No Known Allergies  CODE STATUS:     Code Status Orders        Start     Ordered   06/29/15 1207  Full code   Continuous     06/29/15 1206    Code Status History    Date Active Date Inactive Code Status Order ID Comments User Context   This patient has a current code status but no historical code status.    Advance Directive Documentation        Most Recent Value   Type of Advance Directive  Healthcare Power of Attorney, Living will   Pre-existing out of  facility DNR order (yellow form or pink MOST form)     "MOST" Form in Place?        TOTAL TIME TAKING CARE OF THIS PATIENT: 35  minutes.    Theodoro Grist M.D on 07/12/2015 at 12:35 PM  Between 7am to 6pm - Pager - [phone]  After 6pm go to [email or web address] - password [email or web address] [facility]  [facility]  Office  [phone]  CC: Primary care physician; Adrian Prows, MD

## 2015-07-28 DIAGNOSIS — F329 Major depressive disorder, single episode, unspecified: Secondary | ICD-10-CM | POA: Insufficient documentation

## 2015-07-28 DIAGNOSIS — F32A Depression, unspecified: Secondary | ICD-10-CM | POA: Insufficient documentation

## 2016-02-22 ENCOUNTER — Emergency Department: Payer: Medicare Other

## 2016-02-22 ENCOUNTER — Emergency Department
Admission: EM | Admit: 2016-02-22 | Discharge: 2016-02-22 | Disposition: A | Discharge status: Home / Self Care | Payer: Medicare Other | Attending: Emergency Medicine | Admitting: Emergency Medicine

## 2016-02-22 ENCOUNTER — Encounter: Payer: Self-pay | Admitting: Emergency Medicine

## 2016-02-22 DIAGNOSIS — M25511 Pain in right shoulder: Secondary | ICD-10-CM | POA: Diagnosis not present

## 2016-02-22 DIAGNOSIS — R319 Hematuria, unspecified: Secondary | ICD-10-CM | POA: Insufficient documentation

## 2016-02-22 DIAGNOSIS — E039 Hypothyroidism, unspecified: Secondary | ICD-10-CM | POA: Diagnosis not present

## 2016-02-22 DIAGNOSIS — Z8546 Personal history of malignant neoplasm of prostate: Secondary | ICD-10-CM | POA: Insufficient documentation

## 2016-02-22 DIAGNOSIS — M542 Cervicalgia: Secondary | ICD-10-CM | POA: Insufficient documentation

## 2016-02-22 DIAGNOSIS — Z79899 Other long term (current) drug therapy: Secondary | ICD-10-CM | POA: Insufficient documentation

## 2016-02-22 DIAGNOSIS — I10 Essential (primary) hypertension: Secondary | ICD-10-CM | POA: Insufficient documentation

## 2016-02-22 DIAGNOSIS — W19XXXA Unspecified fall, initial encounter: Secondary | ICD-10-CM

## 2016-02-22 DIAGNOSIS — N2889 Other specified disorders of kidney and ureter: Secondary | ICD-10-CM

## 2016-02-22 DIAGNOSIS — Z791 Long term (current) use of non-steroidal anti-inflammatories (NSAID): Secondary | ICD-10-CM | POA: Insufficient documentation

## 2016-02-22 DIAGNOSIS — G9389 Other specified disorders of brain: Secondary | ICD-10-CM | POA: Insufficient documentation

## 2016-02-22 DIAGNOSIS — R8290 Unspecified abnormal findings in urine: Secondary | ICD-10-CM | POA: Diagnosis present

## 2016-02-22 HISTORY — DX: Bradycardia, unspecified: R00.1

## 2016-02-22 LAB — BASIC METABOLIC PANEL
Anion gap: 13 (ref 5–15)
BUN: 27 mg/dL — AB (ref 6–20)
CHLORIDE: 98 mmol/L — AB (ref 101–111)
CO2: 23 mmol/L (ref 22–32)
CREATININE: 1.4 mg/dL — AB (ref 0.61–1.24)
Calcium: 8.9 mg/dL (ref 8.9–10.3)
GFR calc non Af Amer: 44 mL/min — ABNORMAL LOW (ref >60)
GFR, EST AFRICAN AMERICAN: 51 mL/min — AB (ref >60)
Glucose, Bld: 108 mg/dL — ABNORMAL HIGH (ref 65–99)
Potassium: 3.8 mmol/L (ref 3.5–5.1)
SODIUM: 134 mmol/L — AB (ref 135–145)

## 2016-02-22 LAB — CBC WITH DIFFERENTIAL/PLATELET
Basophils Absolute: 0 10*3/uL (ref 0–0.1)
Basophils Relative: 0 %
Eosinophils Absolute: 0 10*3/uL (ref 0–0.7)
Eosinophils Relative: 0 %
HCT: 36 % — ABNORMAL LOW (ref 40.0–52.0)
Hemoglobin: 12.6 g/dL — ABNORMAL LOW (ref 13.0–18.0)
Lymphocytes Relative: 3 %
Lymphs Abs: 0.4 10*3/uL — ABNORMAL LOW (ref 1.0–3.6)
MCH: 32.1 pg (ref 26.0–34.0)
MCHC: 34.8 g/dL (ref 32.0–36.0)
MCV: 92.2 fL (ref 80.0–100.0)
Monocytes Absolute: 1.3 10*3/uL — ABNORMAL HIGH (ref 0.2–1.0)
Monocytes Relative: 10 %
Neutro Abs: 11.5 10*3/uL — ABNORMAL HIGH (ref 1.4–6.5)
Neutrophils Relative %: 87 %
Platelets: 190 10*3/uL (ref 150–440)
RBC: 3.91 MIL/uL — ABNORMAL LOW (ref 4.40–5.90)
RDW: 14 % (ref 11.5–14.5)
WBC: 13.3 10*3/uL — ABNORMAL HIGH (ref 3.8–10.6)

## 2016-02-22 LAB — URINALYSIS COMPLETE WITH MICROSCOPIC (ARMC ONLY)
BILIRUBIN URINE: NEGATIVE
Bacteria, UA: NONE SEEN
GLUCOSE, UA: NEGATIVE mg/dL
Leukocytes, UA: NEGATIVE
NITRITE: NEGATIVE
Protein, ur: 100 mg/dL — AB
SPECIFIC GRAVITY, URINE: 1.019 (ref 1.005–1.030)
Squamous Epithelial / LPF: NONE SEEN
pH: 5 (ref 5.0–8.0)

## 2016-02-22 NOTE — Discharge Instructions (Signed)
CT scan of abdomen shows: Mass arising from the left kidney consistent with renal cell carcinoma. This mass invades the left renal collecting system but does not appear to invade the left renal vein on this noncontrast enhanced study. This mass measures 11.4 x 8.8 x 8.6 cm

## 2016-02-22 NOTE — ED Triage Notes (Signed)
Patient to ER from [facility] for c/o fall last night at approx 2100. Patient does not remember details surrounding fall. Patient normally walks around independently with cane without difficulty. Patient states he has had increasing weakness today. Reports dark odorous urine x4-5 days.

## 2016-02-22 NOTE — ED Provider Notes (Signed)
[facility] Emergency Department Provider Note  ____________________________________________  Time seen: Approximately 4:10 PM  I have reviewed the triage vital signs and the nursing notes.   HISTORY  Chief Complaint Fall    HPI Frank Bartlett is a 82 y.o. male comes the ED for evaluation of a fall last night. He was at [employer] [place], does not think he passed out but thinks that he had a trip and fall. Complains of some pain in the right shoulder after falling down. He was able to crawl to the bathroom and pulled his call bell salicylate would help him. His only complaint is dark urine for the past for 5 days and some generalized weakness. No focal findings. No chest pain or shortness of breath. No fevers or chills. He is eating and drinking normally. Denies headache but does report some neck pain.   Past Medical History:  Diagnosis Date  . B12 deficiency   . Bradycardia   . Hyperlipidemia   . Hypertension   . Osteoarthritis   . Prostate cancer [facility])      Patient Active Problem List   Diagnosis Date Noted  . Acute renal failure ([place]) 07/01/2015  . C. difficile colitis 07/01/2015  . Generalized weakness 07/01/2015  . Hypokalemia 07/01/2015  . Elevated transaminase level 07/01/2015  . Leukocytosis 07/01/2015  . Anemia 07/01/2015  . Hyponatremia 06/29/2015     Past Surgical History:  Procedure Laterality Date  . CATARACT EXTRACTION, BILATERAL    . EXCISIONAL HEMORRHOIDECTOMY    . INSERTION PROSTATE RADIATION SEED       Prior to Admission medications   Medication Sig Start Date End Date Taking? Authorizing Provider  aspirin 81 MG tablet Take 81 mg by mouth daily.   Yes Historical Provider, MD  levothyroxine (SYNTHROID, LEVOTHROID) 112 MCG tablet Take 112 mcg by mouth daily before breakfast.   Yes Historical Provider, MD  lisinopril-hydrochlorothiazide (PRINZIDE,ZESTORETIC) 20-12.5 MG tablet take 1 tablet by mouth once daily 12/30/15  Yes  Historical Provider, MD  meloxicam (MOBIC) 15 MG tablet Take 7.5 mg by mouth.  02/15/16 02/14/17 Yes Historical Provider, MD  vitamin B-12 (CYANOCOBALAMIN) 1000 MCG tablet Take 1,000 mcg by mouth daily.   Yes Historical Provider, MD  feeding supplement, ENSURE ENLIVE, (ENSURE ENLIVE) LIQD Take 237 mLs by mouth 2 (two) times daily with a meal. 07/01/15   Theodoro Grist, MD     Allergies Review of patient's allergies indicates no known allergies.   Family History  Problem Relation Age of Onset  . Hypertension Mother   . Breast cancer Mother     Social History Social History  Substance Use Topics  . Smoking status: Never Smoker  . Smokeless tobacco: Never Used  . Alcohol use No    Review of Systems  Constitutional:   No fever or chills.  ENT:   No sore throat. No rhinorrhea. Cardiovascular:   No chest pain. Respiratory:   No dyspnea or cough. Gastrointestinal:   Negative for abdominal pain, vomiting and diarrhea.  Genitourinary:   Positive dark urine. Musculoskeletal:   Positive neck pain and right shoulder pain. Neurological:   Negative for headaches 10-point ROS otherwise negative.  ____________________________________________   PHYSICAL EXAM:  VITAL SIGNS: ED Triage Vitals  Enc Vitals Group     BP 02/22/16 1132 (!) 144/67     Pulse Rate 02/22/16 1132 (!) 51     Resp 02/22/16 1132 15     Temp 02/22/16 1132 98.3 F (36.8 C)  Temp src --      SpO2 02/22/16 1132 98 %     Weight 02/22/16 1133 160 lb (72.6 kg)     Height 02/22/16 1133 5\' 9"  (1.753 m)     Head Circumference --      Peak Flow --      Pain Score 02/22/16 1133 5     Pain Loc --      Pain Edu? --      Excl. in [place]? --     Vital signs reviewed, nursing assessments reviewed.   Constitutional:   Alert and oriented. Well appearing and in no distress. Eyes:   No scleral icterus. No conjunctival pallor. PERRL. EOMI.  No nystagmus. ENT   Head:   Normocephalic and atraumatic.   Nose:   No  congestion/rhinnorhea. No septal hematoma   Mouth/Throat:   MMM, no pharyngeal erythema. No peritonsillar mass.    Neck:   No stridor. No SubQ emphysema. No meningismus.No midline spinal tenderness Hematological/Lymphatic/Immunilogical:   No cervical lymphadenopathy. Cardiovascular:   RRR. Symmetric bilateral radial and DP pulses.  No murmurs.  Respiratory:   Normal respiratory effort without tachypnea nor retractions. Breath sounds are clear and equal bilaterally. No wheezes/rales/rhonchi. Gastrointestinal:   Soft and nontender. Non distended. There is no CVA tenderness.  No rebound, rigidity, or guarding. Genitourinary:   deferred Musculoskeletal:   Nontender with normal range of motion in all extremities. No joint effusions.  No lower extremity tenderness.  No edema. Specifically no tenderness about the right shoulder and normal range of motion. Neurologic:   Normal speech and language.  CN 2-10 normal. Motor grossly intact. No gross focal neurologic deficits are appreciated.  Skin:    Skin is warm, dry and intact. No rash noted.  No petechiae, purpura, or bullae.  ____________________________________________    LABS (pertinent positives/negatives) (all labs ordered are listed, but only abnormal results are displayed) Labs Reviewed  BASIC METABOLIC PANEL - Abnormal; Notable for the following:       Result Value   Sodium 134 (*)    Chloride 98 (*)    Glucose, Bld 108 (*)    BUN 27 (*)    Creatinine, Ser 1.40 (*)    GFR calc non Af Amer 44 (*)    GFR calc Af Amer 51 (*)    All other components within normal limits  CBC WITH DIFFERENTIAL/PLATELET - Abnormal; Notable for the following:    WBC 13.3 (*)    RBC 3.91 (*)    Hemoglobin 12.6 (*)    HCT 36.0 (*)    Neutro Abs 11.5 (*)    Lymphs Abs 0.4 (*)    Monocytes Absolute 1.3 (*)    All other components within normal limits  URINALYSIS COMPLETEWITH MICROSCOPIC ([facility] ONLY) - Abnormal; Notable for the following:    Color,  Urine AMBER (*)    APPearance HAZY (*)    Ketones, ur 1+ (*)    Hgb urine dipstick 3+ (*)    Protein, ur 100 (*)    All other components within normal limits  URINE CULTURE   ____________________________________________   EKG  Interpreted by me Sinus bradycardia rate of 50, normal axis intervals QRS ST segments and T waves.  ____________________________________________    RADIOLOGY  CT head unremarkable CT cervical spine unremarkable CT abdomen and pelvis showsMass arising from the left kidney consistent with renal cell carcinoma. This mass invades the left renal collecting system but does not appear to invade the left renal  vein on this noncontrast enhanced study. This mass measures 11.4 x 8.8 x 8.6 cm  ____________________________________________   PROCEDURES Procedures  ____________________________________________   INITIAL IMPRESSION / ASSESSMENT AND PLAN / ED COURSE  Pertinent labs & imaging results that were available during my care of the patient were reviewed by me and considered in my medical decision making (see chart for details).  Initial workup was significant for hematuria. No evidence of head or C-spine trauma. Other labs are unremarkable. Obtained and CT scan of the abdomen and pelvis to evaluate the patient for a kidney stone and found exactly has a left renal mass concerning for renal cell carcinoma. I discussed this with the patient and advise close follow-up with urology and primary care. He acknowledges and will call to arrange this. Medically stable and does not require any further workup or treatment in the hospital. [facility] discharge him home.   Clinical Course   ____________________________________________   FINAL CLINICAL IMPRESSION(S) / ED DIAGNOSES  Final diagnoses:  Fall, initial encounter  Hematuria, unspecified type  Left renal mass       Portions of this note were generated with dragon dictation software. Dictation errors may  occur despite best attempts at proofreading.    Carrie Mew, MD 02/22/16 [id number]

## 2016-02-23 LAB — URINE CULTURE

## 2016-02-24 ENCOUNTER — Ambulatory Visit (INDEPENDENT_AMBULATORY_CARE_PROVIDER_SITE_OTHER): Payer: Medicare Other | Admitting: Urology

## 2016-02-24 ENCOUNTER — Encounter: Payer: Self-pay | Admitting: Urology

## 2016-02-24 VITALS — BP 115/67 | HR 82 | Ht 69.0 in | Wt 163.9 lb

## 2016-02-24 DIAGNOSIS — N2889 Other specified disorders of kidney and ureter: Secondary | ICD-10-CM

## 2016-02-24 NOTE — Progress Notes (Signed)
02/24/2016 10:53 AM   Frank Bartlett 1925/12/18 [id number]  Referring provider: Leonel Ramsay, MD [place], [place] [address]  Chief Complaint  Patient presents with  . New Patient (Initial Visit)    Renal mass     HPI: The patient is an 82 year old gentleman with a past medical history of prostate cancer treated with brachii therapy who presents for evaluation of a left renal mass which was incidentally found after being seen in the ER for a fall.  The mass is 11.5 cm. It does not appear to invade the renal vein. The patient notes that he is unaware of this renal mass prior to this diagnosis in the emergency department. At baseline, he has no symptoms from it though he does think he may have some blood in his urine. He voids well without complaints at this time.  He does have a history of prostate cancer treated with brachytherapy in 2001. His PSA was undetectable in February 2016.   PMH: Past Medical History:  Diagnosis Date  . B12 deficiency   . Bradycardia   . Hyperlipidemia   . Hypertension   . Hypothyroidism   . Osteoarthritis   . Prostate cancer [facility])     Surgical History: Past Surgical History:  Procedure Laterality Date  . CATARACT EXTRACTION, BILATERAL    . EXCISIONAL HEMORRHOIDECTOMY    . INSERTION PROSTATE RADIATION SEED      Home Medications:    Medication List       Accurate as of 02/24/16 10:53 AM. Always use your most recent med list.          aspirin EC 81 MG tablet Take by mouth.   feeding supplement (ENSURE ENLIVE) Liqd Take 237 mLs by mouth 2 (two) times daily with a meal.   levothyroxine 112 MCG tablet Commonly known as:  SYNTHROID, LEVOTHROID Take 112 mcg by mouth daily before breakfast.   lisinopril-hydrochlorothiazide 20-12.5 MG tablet Commonly known as:  PRINZIDE,ZESTORETIC take 1 tablet by mouth once daily   meloxicam 15 MG tablet Commonly known as:   MOBIC Take 7.5 mg by mouth.   MULTI-VITAMINS Tabs Take by mouth.   RA VITAMIN B-12 TR 1000 MCG Tbcr Generic drug:  Cyanocobalamin Take by mouth.       Allergies: No Known Allergies  Family History: Family History  Problem Relation Age of Onset  . Hypertension Mother   . Breast cancer Mother   . Prostate cancer Neg Hx     Social History:  reports that he has never smoked. He has never used smokeless tobacco. He reports that he does not drink alcohol or use drugs.  ROS: UROLOGY Frequent Urination?: No Hard to postpone urination?: Yes Burning/pain with urination?: No Get up at night to urinate?: Yes Leakage of urine?: Yes Urine stream starts and stops?: No Trouble starting stream?: No Do you have to strain to urinate?: No Blood in urine?: Yes Urinary tract infection?: No Sexually transmitted disease?: No Injury to kidneys or bladder?: No Painful intercourse?: No Weak stream?: No Erection problems?: No Penile pain?: No                                      Physical Exam: BP 115/67   Pulse 82   Ht 5\' 9"  (1.753 m)   Wt 163 lb 14.4 oz (74.3 kg)   BMI 24.20  kg/m   Constitutional:  Alert and oriented, No acute distress. HEENT: [place] AT, moist mucus membranes.  Trachea midline, no masses. Cardiovascular: No clubbing, cyanosis, or edema. Respiratory: Normal respiratory effort, no increased work of breathing. GI: Abdomen is soft, nontender, nondistended, no abdominal masses GU: No CVA tenderness.  Skin: No rashes, bruises or suspicious lesions. Lymph: No cervical or inguinal adenopathy. Neurologic: Grossly intact, no focal deficits, moving all 4 extremities. Psychiatric: Normal mood and affect.  Laboratory Data: Lab Results  Component Value Date   WBC 13.3 (H) 02/22/2016   HGB 12.6 (L) 02/22/2016   HCT 36.0 (L) 02/22/2016   MCV 92.2 02/22/2016   PLT 190 02/22/2016    Lab Results  Component Value Date   CREATININE 1.40 (H) 02/22/2016     No results found for: PSA  No results found for: TESTOSTERONE  No results found for: HGBA1C  Urinalysis    Component Value Date/Time   COLORURINE AMBER (A) 02/22/2016 1147   APPEARANCEUR HAZY (A) 02/22/2016 1147   LABSPEC 1.019 02/22/2016 1147   PHURINE 5.0 02/22/2016 1147   GLUCOSEU NEGATIVE 02/22/2016 1147   HGBUR 3+ (A) 02/22/2016 1147   [place] 02/22/2016 1147   KETONESUR 1+ (A) 02/22/2016 1147   PROTEINUR 100 (A) 02/22/2016 1147   NITRITE NEGATIVE 02/22/2016 1147   LEUKOCYTESUR NEGATIVE 02/22/2016 1147   Imaging: CLINICAL DATA:  Pain following fall  EXAM: CT ABDOMEN AND PELVIS WITHOUT CONTRAST  TECHNIQUE: Multidetector CT imaging of the abdomen and pelvis was performed following the standard protocol without oral or intravenous contrast material administration.  COMPARISON:  None.  FINDINGS: Lower chest: There is mild bibasilar lung scarring. No edema or consolidation is noted in the lung bases. There are foci of coronary artery calcification.  Hepatobiliary: There are calcified granulomas in the spleen. There is no perihepatic fluid. There is no obvious liver laceration or rupture on this noncontrast enhanced study. Gallbladder appears mildly distended without wall thickening. There is no appreciable biliary duct dilatation.  Pancreas: No pancreatic mass or inflammatory focus. No peripancreatic fluid.  Spleen: There are splenic granulomas, calcified. No perisplenic fluid. Spleen appears intact on this noncontrast enhanced study.  Adrenals/Urinary Tract: Adrenals appear normal bilaterally. There is a complex solid mass arising from it occupying much of the left kidney measuring 11.4 x 8.8 x 8.6 cm. This mass invades the collecting system but does not clearly invade the left renal vein on this noncontrast enhanced study. There is thickening in the perinephric fascia, but there does not appear to be mass extending beyond the  perinephric fascia. There is also a cyst arising from the upper pole of the left kidney measuring 5.0 x 4.4 cm. There is no hydronephrosis on either side. There is no perinephric fluid on either side. There are small calcifications in the mass in the left kidney. In the right kidney, there are several 1-2 mm calculi. There is no ureteral calculus on either side. Urinary bladder is midline with wall thickness within normal limits.  Stomach/Bowel: There are multiple sigmoid diverticula without diverticulitis. There are scattered diverticular elsewhere in the colon, primarily in the right colon. There is no bowel wall or mesenteric thickening. No bowel obstruction. No free air or portal venous air.  Vascular/Lymphatic: Aorta is ectatic with atherosclerotic calcification throughout the aorta. There is dilatation of the distal aorta just proximal to the bifurcation with a maximum transverse diameter of 3.7 x 3.1 cm. Calcification is also noted in both common iliac, external iliac, and hypogastric  arteries. The major mesenteric vessels appear patent on this noncontrast enhanced study. No adenopathy is evident in the abdomen or pelvis.  Reproductive: There are seed implants throughout the prostate consistent with prostate carcinoma brachytherapy. There is no pelvic mass or pelvic fluid collection.  Other: No intraperitoneal or retroperitoneal fluid collections are identified. Appendix appears normal. No abscess or ascites evident in the abdomen or pelvis. There is a small right inguinal hernia which contains a loop of small bowel but no bowel compromise.  Musculoskeletal: There is extensive degenerative type change in the thoracic spine. There is no evident acute fracture. No blastic or lytic bone lesions are apparent. No intramuscular or abdominal wall lesions evident.  IMPRESSION: **An incidental finding of potential clinical significance has been found. Mass arising from the  left kidney consistent with renal cell carcinoma. This mass invades the left renal collecting system but does not appear to invade the left renal vein on this noncontrast enhanced study. This mass measures 11.4 x 8.8 x 8.6 cm.**  There is extensive colonic diverticulosis without diverticulitis. No bowel obstruction. No abscess.  **An incidental finding of potential clinical significance has been found. Distal abdominal aortic aneurysm with transverse diameter of 3.7 x 3.1 cm. Recommend followup by ultrasound in 2 years. This recommendation follows ACR consensus guidelines: White Paper of the ACR Incidental Findings Committee II on Vascular Findings. J Am Coll Radiol 2013; 10:789-794.**  There is a small right inguinal hernia which contains small bowel but no bowel obstruction.  Seed implants in prostate.  Evidence of prior granulomatous disease with granulomas in the liver and spleen.  Multiple foci of coronary artery calcification noted.  No traumatic appearing lesion is evident on this noncontrast enhanced study.  Assessment & Plan:    I had a long discussion with the patient regarding his new diagnosis of a left renal mass. Due to its imaging characteristics and size, it is a renal cell carcinoma until proven otherwise. He understands that this surgical disease that would require removal of his left kidney. He understands that we'll attempt a left hand-assisted laparoscopic nephrectomy. He does understand the size of its mass and individual characteristics may require [place] to make a larger incision to perform this as an open surgery. We discussed the risks, benefits, and indications of this procedure. He understands the risks include but are not limited to bleeding, infection, need for blood transfusion, iatrogenic injury to surrounding structures, incomplete tumor removal, DVT, prolonged hospitalization, pneumonia, anesthetic complications, and even death. He also understands  there is a risk for dialysis after removing this kidney though I do not feel it adds much to his renal function. His creatinine currently runs between 0.9 1.4 over the last year. He understands it'll be in the hospital for at least 1 day potentially longer. He will have a catheter postoperatively. He will not be able to perform strenuous activity for at least 6 weeks. He is instructed not to lift anything heavier than a gallon of milk.  Due to the size of this mass, he also understands he is at risk for occult lung metastasis. We will obtain a CT of his chest at this time. He understands that even if he does have metastasis to his chest that the recommendation would still be undergo a left nephrectomy with adjuvant therapy after surgery.  1. Left renal mass -CT chest w/ contrast -left hand assist partial nephrectomy  2. History of prostate cancer No evidence of disease   Return for CT scan prior  to surgery. No appt needed currently.  Nickie Retort, MD  [facility] [address] [place], [place] [address] [phone]

## 2016-02-27 ENCOUNTER — Telehealth: Payer: Self-pay | Admitting: Radiology

## 2016-02-27 NOTE — Telephone Encounter (Signed)
Notified pt's son, Richardson Landry, of surgery scheduled with Dr Pilar Jarvis on 03/08/16, pre-admit testing appt on 02/29/16 @10 :45 & to call day prior to surgery for arrival time to [facility]. Advised that pt should hold ASA 81mg  7 days prior to surgery. Richardson Landry voices understanding.

## 2016-02-28 ENCOUNTER — Other Ambulatory Visit: Payer: Self-pay | Admitting: Radiology

## 2016-02-28 DIAGNOSIS — N2889 Other specified disorders of kidney and ureter: Secondary | ICD-10-CM

## 2016-02-29 ENCOUNTER — Encounter
Admission: RE | Admit: 2016-02-29 | Discharge: 2016-02-29 | Disposition: A | Discharge status: Home / Self Care | Payer: Medicare Other | Source: Ambulatory Visit | Attending: Urology | Admitting: Urology

## 2016-02-29 DIAGNOSIS — C61 Malignant neoplasm of prostate: Secondary | ICD-10-CM | POA: Insufficient documentation

## 2016-02-29 DIAGNOSIS — E871 Hypo-osmolality and hyponatremia: Secondary | ICD-10-CM | POA: Insufficient documentation

## 2016-02-29 DIAGNOSIS — A0472 Enterocolitis due to Clostridium difficile, not specified as recurrent: Secondary | ICD-10-CM | POA: Insufficient documentation

## 2016-02-29 DIAGNOSIS — D72829 Elevated white blood cell count, unspecified: Secondary | ICD-10-CM | POA: Insufficient documentation

## 2016-02-29 DIAGNOSIS — Z01812 Encounter for preprocedural laboratory examination: Secondary | ICD-10-CM | POA: Insufficient documentation

## 2016-02-29 DIAGNOSIS — N179 Acute kidney failure, unspecified: Secondary | ICD-10-CM | POA: Insufficient documentation

## 2016-02-29 DIAGNOSIS — R531 Weakness: Secondary | ICD-10-CM | POA: Insufficient documentation

## 2016-02-29 DIAGNOSIS — E785 Hyperlipidemia, unspecified: Secondary | ICD-10-CM | POA: Diagnosis not present

## 2016-02-29 DIAGNOSIS — M1990 Unspecified osteoarthritis, unspecified site: Secondary | ICD-10-CM | POA: Insufficient documentation

## 2016-02-29 DIAGNOSIS — D649 Anemia, unspecified: Secondary | ICD-10-CM | POA: Diagnosis not present

## 2016-02-29 DIAGNOSIS — I10 Essential (primary) hypertension: Secondary | ICD-10-CM | POA: Diagnosis not present

## 2016-02-29 DIAGNOSIS — E538 Deficiency of other specified B group vitamins: Secondary | ICD-10-CM | POA: Insufficient documentation

## 2016-02-29 LAB — COMPREHENSIVE METABOLIC PANEL
ALBUMIN: 3.5 g/dL (ref 3.5–5.0)
ALT: 20 U/L (ref 17–63)
AST: 23 U/L (ref 15–41)
Alkaline Phosphatase: 61 U/L (ref 38–126)
Anion gap: 11 (ref 5–15)
BUN: 18 mg/dL (ref 6–20)
CHLORIDE: 96 mmol/L — AB (ref 101–111)
CO2: 27 mmol/L (ref 22–32)
CREATININE: 1 mg/dL (ref 0.61–1.24)
Calcium: 8.9 mg/dL (ref 8.9–10.3)
GFR calc Af Amer: >60 mL/min (ref >60)
GFR calc non Af Amer: >60 mL/min (ref >60)
GLUCOSE: 88 mg/dL (ref 65–99)
POTASSIUM: 3.7 mmol/L (ref 3.5–5.1)
Sodium: 134 mmol/L — ABNORMAL LOW (ref 135–145)
Total Bilirubin: 0.5 mg/dL (ref 0.3–1.2)
Total Protein: 7.8 g/dL (ref 6.5–8.1)

## 2016-02-29 LAB — CBC
HCT: 34 % — ABNORMAL LOW (ref 40.0–52.0)
Hemoglobin: 11.9 g/dL — ABNORMAL LOW (ref 13.0–18.0)
MCH: 32.5 pg (ref 26.0–34.0)
MCHC: 35 g/dL (ref 32.0–36.0)
MCV: 92.8 fL (ref 80.0–100.0)
PLATELETS: 437 10*3/uL (ref 150–440)
RBC: 3.66 MIL/uL — AB (ref 4.40–5.90)
RDW: 13.4 % (ref 11.5–14.5)
WBC: 6.7 10*3/uL (ref 3.8–10.6)

## 2016-02-29 LAB — PROTIME-INR
INR: 0.95
Prothrombin Time: 12.7 seconds (ref 11.4–15.2)

## 2016-02-29 LAB — APTT: APTT: 35 s (ref 24–36)

## 2016-02-29 NOTE — Patient Instructions (Signed)
Your procedure is scheduled on: Thursday 03/08/16 Report to Day Surgery. 2ND FLOOR MEDICAL MALL ENTRANCE To find out your arrival time please call [phone] between 1PM - 3PM on Wednesday 03/07/16.  Remember: Instructions that are not followed completely may result in serious medical risk, up to and including death, or upon the discretion of your surgeon and anesthesiologist your surgery may need to be rescheduled.    __X__ 1. Do not eat food or drink liquids after midnight. No gum chewing or hard candies.     __X__ 2. No Alcohol/NO SMOKING for 24 hours before or after surgery.   ____ 3. Bring all medications with you on the day of surgery if instructed.    __X__ 4. Notify your doctor if there is any change in your medical condition     (cold, fever, infections).     Do not wear jewelry, make-up, hairpins, clips or nail polish.  Do not wear lotions, powders, or perfumes.   Do not shave 48 hours prior to surgery. Men may shave face and neck.  Do not bring valuables to the hospital.    [facility] is not responsible for any belongings or valuables.               Contacts, dentures or bridgework may not be worn into surgery.  Leave your suitcase in the car. After surgery it may be brought to your room.  For patients admitted to the hospital, discharge time is determined by your                treatment team.   Patients discharged the day of surgery will not be allowed to drive home.   Please read over the following fact sheets that you were given:   Pain Booklet   __X__ Take these medicines the morning of surgery with A SIP OF WATER:    1. LEVOTHYROXINE  2.   3.   4.  5.  6.  ____ Fleet Enema (as directed)   __X__ Use CHG Soap as directed  ____ Use inhalers on the day of surgery  ____ Stop metformin 2 days prior to surgery    ____ Take 1/2 of usual insulin dose the night before surgery and none on the morning of surgery.   __X__ Stop Coumadin/Plavix/aspirin on ALREADY  STOPPED  __X__ Stop Anti-inflammatories on (MELOXICAM) TODAY   ____ Stop supplements until after surgery.    ____ Bring C-Pap to the hospital.

## 2016-03-01 LAB — URINE CULTURE: Culture: NO GROWTH

## 2016-03-02 ENCOUNTER — Ambulatory Visit
Admission: RE | Admit: 2016-03-02 | Discharge: 2016-03-02 | Disposition: A | Discharge status: Home / Self Care | Payer: Medicare Other | Source: Ambulatory Visit | Attending: Urology | Admitting: Urology

## 2016-03-02 DIAGNOSIS — I700 Atherosclerosis of aorta: Secondary | ICD-10-CM | POA: Diagnosis not present

## 2016-03-02 DIAGNOSIS — N2889 Other specified disorders of kidney and ureter: Secondary | ICD-10-CM | POA: Insufficient documentation

## 2016-03-02 DIAGNOSIS — K449 Diaphragmatic hernia without obstruction or gangrene: Secondary | ICD-10-CM | POA: Insufficient documentation

## 2016-03-02 MED ORDER — IOPAMIDOL (ISOVUE-300) INJECTION 61%
75.0000 mL | Freq: Once | INTRAVENOUS | Status: AC | PRN
  Administered 2016-03-02: 75 mL via INTRAVENOUS

## 2016-03-07 DIAGNOSIS — I4891 Unspecified atrial fibrillation: Secondary | ICD-10-CM

## 2016-03-07 DIAGNOSIS — C642 Malignant neoplasm of left kidney, except renal pelvis: Secondary | ICD-10-CM

## 2016-03-07 HISTORY — DX: Malignant neoplasm of left kidney, except renal pelvis: C64.2

## 2016-03-07 HISTORY — DX: Unspecified atrial fibrillation: I48.91

## 2016-03-07 MED ORDER — CEFAZOLIN SODIUM-DEXTROSE 2-4 GM/100ML-% IV SOLN
2.0000 g | INTRAVENOUS | Status: AC
  Administered 2016-03-08: 2 g via INTRAVENOUS

## 2016-03-08 ENCOUNTER — Inpatient Hospital Stay
Admission: RE | Admit: 2016-03-08 | Discharge: 2016-03-10 | DRG: 661 | Disposition: A | Discharge status: Skilled Nursing Facility | Payer: Medicare Other | Source: Ambulatory Visit | Attending: Urology | Admitting: Urology

## 2016-03-08 ENCOUNTER — Encounter: Payer: Self-pay | Admitting: *Deleted

## 2016-03-08 ENCOUNTER — Inpatient Hospital Stay: Payer: Medicare Other | Admitting: Anesthesiology

## 2016-03-08 ENCOUNTER — Encounter
Admission: RE | Disposition: A | Discharge status: Skilled Nursing Facility | Payer: Self-pay | Source: Ambulatory Visit | Attending: Urology

## 2016-03-08 DIAGNOSIS — I2510 Atherosclerotic heart disease of native coronary artery without angina pectoris: Secondary | ICD-10-CM | POA: Diagnosis present

## 2016-03-08 DIAGNOSIS — I10 Essential (primary) hypertension: Secondary | ICD-10-CM | POA: Diagnosis present

## 2016-03-08 DIAGNOSIS — M1990 Unspecified osteoarthritis, unspecified site: Secondary | ICD-10-CM | POA: Diagnosis present

## 2016-03-08 DIAGNOSIS — E785 Hyperlipidemia, unspecified: Secondary | ICD-10-CM | POA: Diagnosis present

## 2016-03-08 DIAGNOSIS — K5730 Diverticulosis of large intestine without perforation or abscess without bleeding: Secondary | ICD-10-CM | POA: Diagnosis present

## 2016-03-08 DIAGNOSIS — Z8249 Family history of ischemic heart disease and other diseases of the circulatory system: Secondary | ICD-10-CM

## 2016-03-08 DIAGNOSIS — I714 Abdominal aortic aneurysm, without rupture: Secondary | ICD-10-CM | POA: Diagnosis present

## 2016-03-08 DIAGNOSIS — E538 Deficiency of other specified B group vitamins: Secondary | ICD-10-CM | POA: Diagnosis present

## 2016-03-08 DIAGNOSIS — C642 Malignant neoplasm of left kidney, except renal pelvis: Secondary | ICD-10-CM | POA: Diagnosis not present

## 2016-03-08 DIAGNOSIS — E039 Hypothyroidism, unspecified: Secondary | ICD-10-CM | POA: Diagnosis present

## 2016-03-08 DIAGNOSIS — N2889 Other specified disorders of kidney and ureter: Secondary | ICD-10-CM | POA: Diagnosis present

## 2016-03-08 DIAGNOSIS — Z803 Family history of malignant neoplasm of breast: Secondary | ICD-10-CM | POA: Diagnosis not present

## 2016-03-08 DIAGNOSIS — Z8546 Personal history of malignant neoplasm of prostate: Secondary | ICD-10-CM

## 2016-03-08 DIAGNOSIS — M6281 Muscle weakness (generalized): Secondary | ICD-10-CM

## 2016-03-08 HISTORY — PX: LAPAROSCOPIC NEPHRECTOMY, HAND ASSISTED: SHX1929

## 2016-03-08 LAB — CBC
HEMATOCRIT: 36.4 % — AB (ref 40.0–52.0)
HEMOGLOBIN: 12.6 g/dL — AB (ref 13.0–18.0)
MCH: 32 pg (ref 26.0–34.0)
MCHC: 34.7 g/dL (ref 32.0–36.0)
MCV: 92.1 fL (ref 80.0–100.0)
Platelets: 498 10*3/uL — ABNORMAL HIGH (ref 150–440)
RBC: 3.95 MIL/uL — AB (ref 4.40–5.90)
RDW: 13.2 % (ref 11.5–14.5)
WBC: 17.7 10*3/uL — AB (ref 3.8–10.6)

## 2016-03-08 LAB — ABO/RH: ABO/RH(D): A POS

## 2016-03-08 SURGERY — NEPHRECTOMY, HAND-ASSISTED, LAPAROSCOPIC
Anesthesia: General | Site: Abdomen | Laterality: Left | Wound class: Clean Contaminated

## 2016-03-08 MED ORDER — DOCUSATE SODIUM 100 MG PO CAPS
100.0000 mg | ORAL_CAPSULE | Freq: Two times a day (BID) | ORAL | Status: DC
  Administered 2016-03-09 (×2): 100 mg via ORAL
  Filled 2016-03-08 (×2): qty 1

## 2016-03-08 MED ORDER — CEFAZOLIN SODIUM-DEXTROSE 2-4 GM/100ML-% IV SOLN
2.0000 g | Freq: Three times a day (TID) | INTRAVENOUS | Status: AC
  Administered 2016-03-08 – 2016-03-09 (×3): 2 g via INTRAVENOUS
  Filled 2016-03-08 (×4): qty 100

## 2016-03-08 MED ORDER — FAMOTIDINE 20 MG PO TABS
ORAL_TABLET | ORAL | Status: AC
  Filled 2016-03-08: qty 1

## 2016-03-08 MED ORDER — FENTANYL CITRATE (PF) 100 MCG/2ML IJ SOLN
INTRAMUSCULAR | Status: AC
  Filled 2016-03-08: qty 2

## 2016-03-08 MED ORDER — EPHEDRINE SULFATE 50 MG/ML IJ SOLN
INTRAMUSCULAR | Status: DC | PRN
  Administered 2016-03-08 (×2): 10 mg via INTRAVENOUS

## 2016-03-08 MED ORDER — LISINOPRIL 20 MG PO TABS
20.0000 mg | ORAL_TABLET | Freq: Every day | ORAL | Status: DC
  Administered 2016-03-09 – 2016-03-10 (×2): 20 mg via ORAL
  Filled 2016-03-08 (×2): qty 1

## 2016-03-08 MED ORDER — THROMBIN 5000 UNITS EX SOLR
CUTANEOUS | Status: DC | PRN
  Administered 2016-03-08: 5000 [IU] via TOPICAL

## 2016-03-08 MED ORDER — HYDROCODONE-ACETAMINOPHEN 5-325 MG PO TABS
1.0000 | ORAL_TABLET | ORAL | Status: DC | PRN
  Administered 2016-03-09 – 2016-03-10 (×3): 2 via ORAL
  Filled 2016-03-08 (×3): qty 2

## 2016-03-08 MED ORDER — THROMBIN 5000 UNITS EX SOLR
CUTANEOUS | Status: AC
  Filled 2016-03-08: qty 5000

## 2016-03-08 MED ORDER — LABETALOL HCL 5 MG/ML IV SOLN
10.0000 mg | Freq: Once | INTRAVENOUS | Status: AC
  Administered 2016-03-08: 10 mg via INTRAVENOUS

## 2016-03-08 MED ORDER — OXYCODONE HCL 5 MG/5ML PO SOLN: 5.0000 mg | Freq: Once | ORAL | Status: DC | PRN

## 2016-03-08 MED ORDER — LEVOTHYROXINE SODIUM 112 MCG PO TABS
112.0000 ug | ORAL_TABLET | Freq: Every day | ORAL | Status: DC
  Administered 2016-03-09 – 2016-03-10 (×2): 112 ug via ORAL
  Filled 2016-03-08 (×2): qty 1

## 2016-03-08 MED ORDER — MORPHINE SULFATE (PF) 2 MG/ML IV SOLN
2.0000 mg | INTRAVENOUS | Status: DC | PRN
  Administered 2016-03-08 – 2016-03-09 (×4): 2 mg via INTRAVENOUS
  Filled 2016-03-08 (×4): qty 1

## 2016-03-08 MED ORDER — BUPIVACAINE HCL 0.5 % IJ SOLN
INTRAMUSCULAR | Status: DC | PRN
  Administered 2016-03-08: 20 mL

## 2016-03-08 MED ORDER — HEPARIN SODIUM (PORCINE) 5000 UNIT/ML IJ SOLN
5000.0000 [IU] | Freq: Three times a day (TID) | INTRAMUSCULAR | Status: DC
  Administered 2016-03-08 – 2016-03-10 (×6): 5000 [IU] via SUBCUTANEOUS
  Filled 2016-03-08 (×6): qty 1

## 2016-03-08 MED ORDER — ARTIFICIAL TEARS OP OINT
TOPICAL_OINTMENT | OPHTHALMIC | Status: DC | PRN
  Administered 2016-03-08: 1 via OPHTHALMIC

## 2016-03-08 MED ORDER — HYDROCHLOROTHIAZIDE 12.5 MG PO CAPS
12.5000 mg | ORAL_CAPSULE | Freq: Every day | ORAL | Status: DC
  Administered 2016-03-09 – 2016-03-10 (×2): 12.5 mg via ORAL
  Filled 2016-03-08 (×2): qty 1

## 2016-03-08 MED ORDER — HYDRALAZINE HCL 20 MG/ML IJ SOLN
10.0000 mg | Freq: Four times a day (QID) | INTRAMUSCULAR | Status: DC | PRN
  Administered 2016-03-08: 10 mg via INTRAVENOUS
  Filled 2016-03-08: qty 1

## 2016-03-08 MED ORDER — PROMETHAZINE HCL 25 MG/ML IJ SOLN: 6.2500 mg | INTRAMUSCULAR | Status: DC | PRN

## 2016-03-08 MED ORDER — LABETALOL HCL 5 MG/ML IV SOLN
INTRAVENOUS | Status: DC | PRN
  Administered 2016-03-08: 5 mg via INTRAVENOUS

## 2016-03-08 MED ORDER — LACTATED RINGERS IV SOLN
INTRAVENOUS | Status: DC
Start: 2016-03-08 — End: 2016-03-08
  Administered 2016-03-08 (×2): via INTRAVENOUS

## 2016-03-08 MED ORDER — ONDANSETRON HCL 4 MG/2ML IJ SOLN
INTRAMUSCULAR | Status: DC | PRN
  Administered 2016-03-08: 4 mg via INTRAVENOUS

## 2016-03-08 MED ORDER — ONDANSETRON HCL 4 MG/2ML IJ SOLN
4.0000 mg | INTRAMUSCULAR | Status: DC | PRN
  Administered 2016-03-08: 4 mg via INTRAVENOUS
  Filled 2016-03-08: qty 2

## 2016-03-08 MED ORDER — OXYCODONE HCL 5 MG PO TABS: 5.0000 mg | ORAL_TABLET | Freq: Once | ORAL | Status: DC | PRN

## 2016-03-08 MED ORDER — BUPIVACAINE HCL (PF) 0.5 % IJ SOLN
INTRAMUSCULAR | Status: AC
  Filled 2016-03-08: qty 30

## 2016-03-08 MED ORDER — LABETALOL HCL 5 MG/ML IV SOLN
INTRAVENOUS | Status: AC
  Filled 2016-03-08: qty 4

## 2016-03-08 MED ORDER — ESMOLOL HCL 100 MG/10ML IV SOLN
INTRAVENOUS | Status: DC | PRN
  Administered 2016-03-08: 30 mg via INTRAVENOUS
  Administered 2016-03-08: 20 mg via INTRAVENOUS
  Administered 2016-03-08: 30 mg via INTRAVENOUS

## 2016-03-08 MED ORDER — BUPIVACAINE LIPOSOME 1.3 % IJ SUSP
INTRAMUSCULAR | Status: DC | PRN
  Administered 2016-03-08: 20 mL

## 2016-03-08 MED ORDER — PROPOFOL 10 MG/ML IV BOLUS
INTRAVENOUS | Status: DC | PRN
  Administered 2016-03-08: 120 mg via INTRAVENOUS

## 2016-03-08 MED ORDER — FENTANYL CITRATE (PF) 100 MCG/2ML IJ SOLN
25.0000 ug | INTRAMUSCULAR | Status: DC | PRN
  Administered 2016-03-08 (×3): 25 ug via INTRAVENOUS
  Administered 2016-03-08: 50 ug via INTRAVENOUS
  Administered 2016-03-08: 25 ug via INTRAVENOUS

## 2016-03-08 MED ORDER — FAMOTIDINE 20 MG PO TABS
20.0000 mg | ORAL_TABLET | Freq: Once | ORAL | Status: AC
  Administered 2016-03-08: 20 mg via ORAL

## 2016-03-08 MED ORDER — CEFAZOLIN SODIUM-DEXTROSE 2-4 GM/100ML-% IV SOLN
INTRAVENOUS | Status: AC
  Filled 2016-03-08: qty 100

## 2016-03-08 MED ORDER — SUGAMMADEX SODIUM 200 MG/2ML IV SOLN
INTRAVENOUS | Status: DC | PRN
  Administered 2016-03-08: 145.2 mg via INTRAVENOUS

## 2016-03-08 MED ORDER — BUPIVACAINE LIPOSOME 1.3 % IJ SUSP
INTRAMUSCULAR | Status: AC
  Filled 2016-03-08: qty 20

## 2016-03-08 MED ORDER — LISINOPRIL-HYDROCHLOROTHIAZIDE 20-12.5 MG PO TABS: 1.0000 | ORAL_TABLET | Freq: Every day | ORAL | Status: DC

## 2016-03-08 MED ORDER — MEPERIDINE HCL 25 MG/ML IJ SOLN: 6.2500 mg | INTRAMUSCULAR | Status: DC | PRN

## 2016-03-08 MED ORDER — PHENYLEPHRINE HCL 10 MG/ML IJ SOLN
INTRAMUSCULAR | Status: DC | PRN
  Administered 2016-03-08 (×2): 50 ug via INTRAVENOUS

## 2016-03-08 MED ORDER — LIDOCAINE HCL (CARDIAC) 20 MG/ML IV SOLN
INTRAVENOUS | Status: DC | PRN
  Administered 2016-03-08: 30 mg via INTRAVENOUS

## 2016-03-08 MED ORDER — SODIUM CHLORIDE 0.9 % IV SOLN
INTRAVENOUS | Status: DC
  Administered 2016-03-08 – 2016-03-10 (×4): via INTRAVENOUS

## 2016-03-08 MED ORDER — ROCURONIUM BROMIDE 100 MG/10ML IV SOLN
INTRAVENOUS | Status: DC | PRN
  Administered 2016-03-08: 50 mg via INTRAVENOUS
  Administered 2016-03-08 (×2): 20 mg via INTRAVENOUS

## 2016-03-08 MED ORDER — FENTANYL CITRATE (PF) 100 MCG/2ML IJ SOLN
INTRAMUSCULAR | Status: DC | PRN
  Administered 2016-03-08: 100 ug via INTRAVENOUS
  Administered 2016-03-08 (×3): 50 ug via INTRAVENOUS

## 2016-03-08 MED ORDER — DEXAMETHASONE SODIUM PHOSPHATE 10 MG/ML IJ SOLN
INTRAMUSCULAR | Status: DC | PRN
  Administered 2016-03-08: 10 mg via INTRAVENOUS

## 2016-03-08 SURGICAL SUPPLY — 83 items
ANCHOR TIS RET SYS 1550ML (BAG) ×3 IMPLANT
APPLICATOR SURGIFLO (MISCELLANEOUS) ×3 IMPLANT
APPLIER CLIP ROT 13.4 12 LRG (CLIP) ×3
BNDG COHESIVE 4X5 TAN STRL (GAUZE/BANDAGES/DRESSINGS) IMPLANT
CANISTER SUCT 1200ML W/VALVE (MISCELLANEOUS) ×3 IMPLANT
CATH TRAY 16F METER LATEX (MISCELLANEOUS) ×3 IMPLANT
CLEANER CAUTERY TIP 5X5 PAD (MISCELLANEOUS) ×1 IMPLANT
CLIP APPLIE ROT 13.4 12 LRG (CLIP) ×1 IMPLANT
CLIP LIGATING HEM O LOK PURPLE (MISCELLANEOUS) ×3 IMPLANT
CLOSURE WOUND 1/2 X4 (GAUZE/BANDAGES/DRESSINGS) ×1
CORD BIP STRL DISP 12FT (MISCELLANEOUS) ×3 IMPLANT
DEFOGGER SCOPE WARMER CLEARIFY (MISCELLANEOUS) ×3 IMPLANT
DERMABOND ADVANCED (GAUZE/BANDAGES/DRESSINGS) ×2
DERMABOND ADVANCED .7 DNX12 (GAUZE/BANDAGES/DRESSINGS) ×1 IMPLANT
DISSECTOR KITTNER STICK (MISCELLANEOUS) ×6 IMPLANT
DISSECTORS/KITTNER STICK (MISCELLANEOUS) ×18
DRAPE INCISE IOBAN 66X45 STRL (DRAPES) ×3 IMPLANT
DRAPE SURG 17X11 SM STRL (DRAPES) ×12 IMPLANT
DRSG TEGADERM 2-3/8X2-3/4 SM (GAUZE/BANDAGES/DRESSINGS) ×9 IMPLANT
DRSG TEGADERM 4X4.75 (GAUZE/BANDAGES/DRESSINGS) ×3 IMPLANT
DRSG TELFA 3X8 NADH (GAUZE/BANDAGES/DRESSINGS) ×3 IMPLANT
ELECT REM PT RETURN 9FT ADLT (ELECTROSURGICAL) ×3
ELECTRODE REM PT RTRN 9FT ADLT (ELECTROSURGICAL) ×1 IMPLANT
ENDOLOOP SUT PDS II  0 18 (SUTURE) ×2
ENDOLOOP SUT PDS II 0 18 (SUTURE) ×1 IMPLANT
GELPORT LAPAROSCOPIC (MISCELLANEOUS) ×3 IMPLANT
GLOVE BIO SURGEON STRL SZ 6.5 (GLOVE) ×2 IMPLANT
GLOVE BIO SURGEONS STRL SZ 6.5 (GLOVE) ×1
GLOVE INDICATOR 6.5 STRL GRN (GLOVE) ×3 IMPLANT
GOWN STRL REUS W/ TWL LRG LVL3 (GOWN DISPOSABLE) ×3 IMPLANT
GOWN STRL REUS W/TWL LRG LVL3 (GOWN DISPOSABLE) ×6
GRASPER SUT TROCAR 14GX15 (MISCELLANEOUS) ×3 IMPLANT
HANDLE YANKAUER SUCT BULB TIP (MISCELLANEOUS) ×3 IMPLANT
IRRIGATION STRYKERFLOW (MISCELLANEOUS) ×1 IMPLANT
IRRIGATOR STRYKERFLOW (MISCELLANEOUS) ×3
IV NS 1000ML (IV SOLUTION) ×2
IV NS 1000ML BAXH (IV SOLUTION) ×1 IMPLANT
KIT PINK PAD W/HEAD ARE REST (MISCELLANEOUS) ×3
KIT PINK PAD W/HEAD ARM REST (MISCELLANEOUS) ×1 IMPLANT
KIT RM TURNOVER STRD PROC AR (KITS) ×3 IMPLANT
L-HOOK LAP DISP 36CM (ELECTROSURGICAL) ×3
LABEL OR SOLS (LABEL) ×3 IMPLANT
LHOOK LAP DISP 36CM (ELECTROSURGICAL) ×1 IMPLANT
LIGASURE MARYLAND LAP STAND (ELECTROSURGICAL) ×3 IMPLANT
LIQUID BAND (GAUZE/BANDAGES/DRESSINGS) ×3 IMPLANT
LOOP RED MAXI  1X406MM (MISCELLANEOUS) ×2
LOOP VESSEL MAXI 1X406 RED (MISCELLANEOUS) ×1 IMPLANT
NEEDLE HYPO 25X1 1.5 SAFETY (NEEDLE) ×3 IMPLANT
NEEDLE INSUFFLATION 14GA 120MM (NEEDLE) ×3 IMPLANT
PACK LAP CHOLECYSTECTOMY (MISCELLANEOUS) ×3 IMPLANT
PAD CLEANER CAUTERY TIP 5X5 (MISCELLANEOUS) ×2
PENCIL ELECTRO HAND CTR (MISCELLANEOUS) ×3 IMPLANT
RELOAD STAPLE SKIN SM 35W (MISCELLANEOUS) ×3 IMPLANT
SCISSORS METZENBAUM CVD 33 (INSTRUMENTS) ×3 IMPLANT
SHEARS HARMONIC ACE PLUS 36CM (ENDOMECHANICALS) IMPLANT
SPOGE SURGIFLO 8M (HEMOSTASIS) ×2
SPONGE LAP 18X18 5 PK (GAUZE/BANDAGES/DRESSINGS) ×3 IMPLANT
SPONGE SURGIFLO 8M (HEMOSTASIS) ×1 IMPLANT
STAPLE RELOAD 2.5MM WHITE (STAPLE) ×27 IMPLANT
STAPLER VASCULAR ECHELON 35 (CUTTER) ×3 IMPLANT
STRIP CLOSURE SKIN 1/2X4 (GAUZE/BANDAGES/DRESSINGS) ×2 IMPLANT
SURGILUBE 2OZ TUBE FLIPTOP (MISCELLANEOUS) IMPLANT
SUT CHROMIC 0 CT 1 (SUTURE) ×3 IMPLANT
SUT MNCRL 4-0 (SUTURE) ×4
SUT MNCRL 4-0 27XMFL (SUTURE) ×2
SUT MNCRL AB 4-0 PS2 18 (SUTURE) ×6 IMPLANT
SUT PDS AB 0 CT1 27 (SUTURE) ×6 IMPLANT
SUT VIC AB 0 CT1 18XCR BRD 8 (SUTURE) ×1 IMPLANT
SUT VIC AB 0 CT1 36 (SUTURE) ×6 IMPLANT
SUT VIC AB 0 CT1 8-18 (SUTURE) ×2
SUT VIC AB 1 CT1 36 (SUTURE) ×9 IMPLANT
SUT VIC AB 2-0 SH 27 (SUTURE) ×4
SUT VIC AB 2-0 SH 27XBRD (SUTURE) ×2 IMPLANT
SUT VIC AB 2-0 UR6 27 (SUTURE) ×6 IMPLANT
SUT VIC AB 3-0 SH 27 (SUTURE) ×2
SUT VIC AB 3-0 SH 27X BRD (SUTURE) ×1 IMPLANT
SUT VIC AB 4-0 FS2 27 (SUTURE) ×6 IMPLANT
SUTURE MNCRL 4-0 27XMF (SUTURE) ×2 IMPLANT
TROCAR ENDOPATH XCEL 12X100 BL (ENDOMECHANICALS) ×9 IMPLANT
TROCAR XCEL 12X100 BLDLESS (ENDOMECHANICALS) ×3 IMPLANT
TROCAR XCEL NON-BLD 5MMX100MML (ENDOMECHANICALS) ×6 IMPLANT
TUBING INSUFFLATOR HI FLOW (MISCELLANEOUS) ×3 IMPLANT
WATER STERILE IRR 1000ML POUR (IV SOLUTION) ×3 IMPLANT

## 2016-03-08 NOTE — Anesthesia Preprocedure Evaluation (Signed)
Anesthesia Evaluation  Patient identified by MRN, date of birth, ID band Patient awake    Reviewed: Allergy & Precautions, NPO status , Patient's Chart, lab work & pertinent test results  History of Anesthesia Complications Negative for: history of anesthetic complications  Airway Mallampati: II  TM Distance: >3 FB Neck ROM: Full    Dental no notable dental hx.    Pulmonary neg pulmonary ROS, neg sleep apnea, neg COPD,    breath sounds clear to auscultation- rhonchi (-) wheezing      Cardiovascular Exercise Tolerance: Good hypertension, Pt. on medications (-) CAD and (-) Past MI  Rhythm:Regular Rate:Normal - Systolic murmurs and - Diastolic murmurs    Neuro/Psych negative neurological ROS  negative psych ROS   GI/Hepatic negative GI ROS, Neg liver ROS,   Endo/Other  neg diabetesHypothyroidism   Renal/GU Renal disease (renal mass)     Musculoskeletal  (+) Arthritis ,   Abdominal (+) - obese,   Peds  Hematology  (+) anemia ,   Anesthesia Other Findings Past Medical History: No date: B12 deficiency No date: Bradycardia No date: Hyperlipidemia No date: Hypertension No date: Hypothyroidism No date: Osteoarthritis 2001: Prostate cancer ([place])     Comment: Rad tx's + seed implants   Reproductive/Obstetrics                             Anesthesia Physical Anesthesia Plan  ASA: II  Anesthesia Plan: General   Post-op Pain Management:    Induction: Intravenous  Airway Management Planned: Oral ETT  Additional Equipment:   Intra-op Plan:   Post-operative Plan: Extubation in OR  Informed Consent: I have reviewed the patients History and Physical, chart, labs and discussed the procedure including the risks, benefits and alternatives for the proposed anesthesia with the patient or authorized representative who has indicated his/her understanding and acceptance.   Dental advisory  given  Plan Discussed with: Anesthesiologist and CRNA  Anesthesia Plan Comments:         Anesthesia Quick Evaluation

## 2016-03-08 NOTE — OR Nursing (Signed)
Lab tech in for abo/rh draw 

## 2016-03-08 NOTE — H&P (View-Only) (Signed)
02/24/2016 10:53 AM   Frank Bartlett 27-Jun-1925 [id number]  Referring provider: Leonel Ramsay, MD [place], [place] [address]  Chief Complaint  Patient presents with  . New Patient (Initial Visit)    Renal mass     HPI: The patient is an 82 year old gentleman with a past medical history of prostate cancer treated with brachii therapy who presents for evaluation of a left renal mass which was incidentally found after being seen in the ER for a fall.  The mass is 11.5 cm. It does not appear to invade the renal vein. The patient notes that he is unaware of this renal mass prior to this diagnosis in the emergency department. At baseline, he has no symptoms from it though he does think he may have some blood in his urine. He voids well without complaints at this time.  He does have a history of prostate cancer treated with brachytherapy in 2001. His PSA was undetectable in February 2016.   PMH: Past Medical History:  Diagnosis Date  . B12 deficiency   . Bradycardia   . Hyperlipidemia   . Hypertension   . Hypothyroidism   . Osteoarthritis   . Prostate cancer [facility])     Surgical History: Past Surgical History:  Procedure Laterality Date  . CATARACT EXTRACTION, BILATERAL    . EXCISIONAL HEMORRHOIDECTOMY    . INSERTION PROSTATE RADIATION SEED      Home Medications:    Medication List       Accurate as of 02/24/16 10:53 AM. Always use your most recent med list.          aspirin EC 81 MG tablet Take by mouth.   feeding supplement (ENSURE ENLIVE) Liqd Take 237 mLs by mouth 2 (two) times daily with a meal.   levothyroxine 112 MCG tablet Commonly known as:  SYNTHROID, LEVOTHROID Take 112 mcg by mouth daily before breakfast.   lisinopril-hydrochlorothiazide 20-12.5 MG tablet Commonly known as:  PRINZIDE,ZESTORETIC take 1 tablet by mouth once daily   meloxicam 15 MG tablet Commonly known as:   MOBIC Take 7.5 mg by mouth.   MULTI-VITAMINS Tabs Take by mouth.   RA VITAMIN B-12 TR 1000 MCG Tbcr Generic drug:  Cyanocobalamin Take by mouth.       Allergies: No Known Allergies  Family History: Family History  Problem Relation Age of Onset  . Hypertension Mother   . Breast cancer Mother   . Prostate cancer Neg Hx     Social History:  reports that he has never smoked. He has never used smokeless tobacco. He reports that he does not drink alcohol or use drugs.  ROS: UROLOGY Frequent Urination?: No Hard to postpone urination?: Yes Burning/pain with urination?: No Get up at night to urinate?: Yes Leakage of urine?: Yes Urine stream starts and stops?: No Trouble starting stream?: No Do you have to strain to urinate?: No Blood in urine?: Yes Urinary tract infection?: No Sexually transmitted disease?: No Injury to kidneys or bladder?: No Painful intercourse?: No Weak stream?: No Erection problems?: No Penile pain?: No                                      Physical Exam: BP 115/67   Pulse 82   Ht 5\' 9"  (1.753 m)   Wt 163 lb 14.4 oz (74.3 kg)   BMI 24.20  kg/m   Constitutional:  Alert and oriented, No acute distress. HEENT: [place] AT, moist mucus membranes.  Trachea midline, no masses. Cardiovascular: No clubbing, cyanosis, or edema. Respiratory: Normal respiratory effort, no increased work of breathing. GI: Abdomen is soft, nontender, nondistended, no abdominal masses GU: No CVA tenderness.  Skin: No rashes, bruises or suspicious lesions. Lymph: No cervical or inguinal adenopathy. Neurologic: Grossly intact, no focal deficits, moving all 4 extremities. Psychiatric: Normal mood and affect.  Laboratory Data: Lab Results  Component Value Date   WBC 13.3 (H) 02/22/2016   HGB 12.6 (L) 02/22/2016   HCT 36.0 (L) 02/22/2016   MCV 92.2 02/22/2016   PLT 190 02/22/2016    Lab Results  Component Value Date   CREATININE 1.40 (H) 02/22/2016     No results found for: PSA  No results found for: TESTOSTERONE  No results found for: HGBA1C  Urinalysis    Component Value Date/Time   COLORURINE AMBER (A) 02/22/2016 1147   APPEARANCEUR HAZY (A) 02/22/2016 1147   LABSPEC 1.019 02/22/2016 1147   PHURINE 5.0 02/22/2016 1147   GLUCOSEU NEGATIVE 02/22/2016 1147   HGBUR 3+ (A) 02/22/2016 1147   [place] 02/22/2016 1147   KETONESUR 1+ (A) 02/22/2016 1147   PROTEINUR 100 (A) 02/22/2016 1147   NITRITE NEGATIVE 02/22/2016 1147   LEUKOCYTESUR NEGATIVE 02/22/2016 1147   Imaging: CLINICAL DATA:  Pain following fall  EXAM: CT ABDOMEN AND PELVIS WITHOUT CONTRAST  TECHNIQUE: Multidetector CT imaging of the abdomen and pelvis was performed following the standard protocol without oral or intravenous contrast material administration.  COMPARISON:  None.  FINDINGS: Lower chest: There is mild bibasilar lung scarring. No edema or consolidation is noted in the lung bases. There are foci of coronary artery calcification.  Hepatobiliary: There are calcified granulomas in the spleen. There is no perihepatic fluid. There is no obvious liver laceration or rupture on this noncontrast enhanced study. Gallbladder appears mildly distended without wall thickening. There is no appreciable biliary duct dilatation.  Pancreas: No pancreatic mass or inflammatory focus. No peripancreatic fluid.  Spleen: There are splenic granulomas, calcified. No perisplenic fluid. Spleen appears intact on this noncontrast enhanced study.  Adrenals/Urinary Tract: Adrenals appear normal bilaterally. There is a complex solid mass arising from it occupying much of the left kidney measuring 11.4 x 8.8 x 8.6 cm. This mass invades the collecting system but does not clearly invade the left renal vein on this noncontrast enhanced study. There is thickening in the perinephric fascia, but there does not appear to be mass extending beyond the  perinephric fascia. There is also a cyst arising from the upper pole of the left kidney measuring 5.0 x 4.4 cm. There is no hydronephrosis on either side. There is no perinephric fluid on either side. There are small calcifications in the mass in the left kidney. In the right kidney, there are several 1-2 mm calculi. There is no ureteral calculus on either side. Urinary bladder is midline with wall thickness within normal limits.  Stomach/Bowel: There are multiple sigmoid diverticula without diverticulitis. There are scattered diverticular elsewhere in the colon, primarily in the right colon. There is no bowel wall or mesenteric thickening. No bowel obstruction. No free air or portal venous air.  Vascular/Lymphatic: Aorta is ectatic with atherosclerotic calcification throughout the aorta. There is dilatation of the distal aorta just proximal to the bifurcation with a maximum transverse diameter of 3.7 x 3.1 cm. Calcification is also noted in both common iliac, external iliac, and hypogastric  arteries. The major mesenteric vessels appear patent on this noncontrast enhanced study. No adenopathy is evident in the abdomen or pelvis.  Reproductive: There are seed implants throughout the prostate consistent with prostate carcinoma brachytherapy. There is no pelvic mass or pelvic fluid collection.  Other: No intraperitoneal or retroperitoneal fluid collections are identified. Appendix appears normal. No abscess or ascites evident in the abdomen or pelvis. There is a small right inguinal hernia which contains a loop of small bowel but no bowel compromise.  Musculoskeletal: There is extensive degenerative type change in the thoracic spine. There is no evident acute fracture. No blastic or lytic bone lesions are apparent. No intramuscular or abdominal wall lesions evident.  IMPRESSION: **An incidental finding of potential clinical significance has been found. Mass arising from the  left kidney consistent with renal cell carcinoma. This mass invades the left renal collecting system but does not appear to invade the left renal vein on this noncontrast enhanced study. This mass measures 11.4 x 8.8 x 8.6 cm.**  There is extensive colonic diverticulosis without diverticulitis. No bowel obstruction. No abscess.  **An incidental finding of potential clinical significance has been found. Distal abdominal aortic aneurysm with transverse diameter of 3.7 x 3.1 cm. Recommend followup by ultrasound in 2 years. This recommendation follows ACR consensus guidelines: White Paper of the ACR Incidental Findings Committee II on Vascular Findings. J Am Coll Radiol 2013; 10:789-794.**  There is a small right inguinal hernia which contains small bowel but no bowel obstruction.  Seed implants in prostate.  Evidence of prior granulomatous disease with granulomas in the liver and spleen.  Multiple foci of coronary artery calcification noted.  No traumatic appearing lesion is evident on this noncontrast enhanced study.  Assessment & Plan:    I had a long discussion with the patient regarding his new diagnosis of a left renal mass. Due to its imaging characteristics and size, it is a renal cell carcinoma until proven otherwise. He understands that this surgical disease that would require removal of his left kidney. He understands that we'll attempt a left hand-assisted laparoscopic nephrectomy. He does understand the size of its mass and individual characteristics may require [place] to make a larger incision to perform this as an open surgery. We discussed the risks, benefits, and indications of this procedure. He understands the risks include but are not limited to bleeding, infection, need for blood transfusion, iatrogenic injury to surrounding structures, incomplete tumor removal, DVT, prolonged hospitalization, pneumonia, anesthetic complications, and even death. He also understands  there is a risk for dialysis after removing this kidney though I do not feel it adds much to his renal function. His creatinine currently runs between 0.9 1.4 over the last year. He understands it'll be in the hospital for at least 1 day potentially longer. He will have a catheter postoperatively. He will not be able to perform strenuous activity for at least 6 weeks. He is instructed not to lift anything heavier than a gallon of milk.  Due to the size of this mass, he also understands he is at risk for occult lung metastasis. We will obtain a CT of his chest at this time. He understands that even if he does have metastasis to his chest that the recommendation would still be undergo a left nephrectomy with adjuvant therapy after surgery.  1. Left renal mass -CT chest w/ contrast -left hand assist partial nephrectomy  2. History of prostate cancer No evidence of disease   Return for CT scan prior  to surgery. No appt needed currently.  Nickie Retort, MD  [facility] [address] [place], [place] [address] [phone]

## 2016-03-08 NOTE — OR Nursing (Signed)
Will start abx in OR per Floyce Stakes, RN

## 2016-03-08 NOTE — Anesthesia Postprocedure Evaluation (Signed)
Anesthesia Post Note  Patient: Frank Bartlett  Procedure(s) Performed: Procedure(s) (LRB): HAND ASSISTED LAPAROSCOPIC NEPHRECTOMY (Left)  Patient location during evaluation: PACU Anesthesia Type: General Level of consciousness: awake and alert Pain management: pain level controlled Vital Signs Assessment: post-procedure vital signs reviewed and stable Respiratory status: spontaneous breathing, nonlabored ventilation and respiratory function stable Cardiovascular status: blood pressure returned to baseline and stable Postop Assessment: no signs of nausea or vomiting Anesthetic complications: no    Last Vitals:  Vitals:   03/08/16 1200 03/08/16 1230  BP: 123/87 (!) 169/69  Pulse: 78 85  Resp: (!) 23 17  Temp:      Last Pain:  Vitals:   03/08/16 1230  TempSrc:   PainSc: Asleep                 Leasia Swann

## 2016-03-08 NOTE — Anesthesia Procedure Notes (Signed)
Procedure Name: Intubation Date/Time: 03/08/2016 7:55 AM Performed by: Jonna Clark Pre-anesthesia Checklist: Patient identified, Patient being monitored, Timeout performed, Emergency Drugs available and Suction available Patient Re-evaluated:Patient Re-evaluated prior to inductionOxygen Delivery Method: Circle system utilized Preoxygenation: Pre-oxygenation with 100% oxygen Intubation Type: IV induction Ventilation: Mask ventilation without difficulty Laryngoscope Size: Mac and 3 Grade View: Grade I Tube type: Oral Tube size: 7.5 mm Number of attempts: 1 Placement Confirmation: ETT inserted through vocal cords under direct vision,  positive ETCO2 and breath sounds checked- equal and bilateral Secured at: 21 cm Tube secured with: Tape Dental Injury: Teeth and Oropharynx as per pre-operative assessment

## 2016-03-08 NOTE — Interval H&P Note (Signed)
History and Physical Interval Note:  03/08/2016 7:29 AM  Frank Bartlett  has presented today for surgery, with the diagnosis of LEFT RENAL MASS  The various methods of treatment have been discussed with the patient and family. After consideration of risks, benefits and other options for treatment, the patient has consented to  Procedure(s): HAND ASSISTED LAPAROSCOPIC NEPHRECTOMY (Left) as a surgical intervention .  The patient's history has been reviewed, patient examined, no change in status, stable for surgery.  I have reviewed the patient's chart and labs.  Questions were answered to the patient's satisfaction.    RRR Lungs clear  Nickie Retort

## 2016-03-08 NOTE — Therapy (Signed)
Attempted to instruct patient on IS, patient too sedated at this time to participate.

## 2016-03-08 NOTE — Op Note (Signed)
Date of procedure: 03/08/16  Preoperative diagnosis:  1. Left renal mass   Postoperative diagnosis:  1. Left renal mass   Procedure: 1. Left hand-assisted laparoscopic radical nephrectomy  Surgeon: Baruch Gouty, MD  Assistant: Hollice Espy, MD  Anesthesia: General  Complications: None  Intraoperative findings: The patient had a large 11.5 cm renal tumor. There is noted to be multiple parasitic vessels. The patient underwent a uneventful left radical nephrectomy.  EBL: 150 cc  Specimens: Left kidney  Drains: 16 French Foley catheter  Disposition: Stable to the postanesthesia care unit  Indication for procedure: The patient is a 82 y.o. male with incidental finding of a large 11.5 centimeter left renal mass concerning for RCC presents today for elective left nephrectomy.  After reviewing the management options for treatment, the patient elected to proceed with the above surgical procedure(s). We have discussed the potential benefits and risks of the procedure, side effects of the proposed treatment, the likelihood of the patient achieving the goals of the procedure, and any potential problems that might occur during the procedure or recuperation. Informed consent has been obtained.  Description of procedure: The patient was met in the preoperative area. All risks, benefits, and indications of the procedure were described in great detail. The patient consented to the procedure. Preoperative antibiotics were given. The patient was taken to the operative theater. General anesthesia was induced per the anesthesia service. A Foley catheter and OG tube were placed. The patient was then placed in the right lateral decubitus/left side up position. All pressure points were protected and padded appropriately. The patient was then prepped and draped in the usual standard fashion. A preoperative timeout was called.   A 7.5 mm incision was made midline superior to the umbilicus for the  hand-assisted port. Electrocautery was used to dissect down through fascia. Peritoneum was opened with Metzenbaum scissors. The hand port was placed. After placement, he was confirmed that no bowel was trapped between the hand port in the anterior abdominal wall. The abdomen was then insufflated. Under direct visualization through a port in the hand port to 12 mm Ports are placed in the left lower quadrant. A 5 mm port was also placed superiorly lateral to these ports for retraction of the kidney. Attention was then turned to starting the laparoscopic procedure. With hand assistance, adhesions between the descending colon and the anterior abdominal wall were incised with sharp scissors. Once the adhesions were lysed, the colon on the left was mobilized on the white line of [place] until it was out of the surgical field. The left gonadal vein and left ureter were then identified and carefully traced back towards the left renal hilum using electrocautery cautiously but as needed. Eventually after careful dissection, the left renal vein was encountered and mobilized. Posterior to this was found to be the left renal artery. This was isolated with careful dissection. Due to calcification seen on the CT scan, the decision was to ligate the left renal artery and veins separately. Using a white staple load, the left renal artery was ligated. The left renal vein was continually mobilized and once freed it was also ligated with a white staple load. At this point the kidney was noted to be dusky consistent with appropriate control of the hilum. The remainder of the kidney was dissected free with the LigaSure. There is noted to be multiple parasitic vessels to the kidney both posteriorly and laterally. Once the kidney was completely mobilized and freed, was placed into a large laparoscopic  bag and removed through the hand-assisted port. After removing the kidney, hemostasis was found to be excellent. There is no active bleeding  at this time. Surgicel was then placed in the hilum and in the resection bed. Laparoscopic ports removed under direct visualization and the abdomen was desufflated. However prior to desufflation, the 12 mm ports were closed with 0 Vicryl with the aid of a [employer]. The midline incision was then closed with fascial sutures of figure-of-eight #1 Vicryls. Exparel was then placed in the fascia and subcutaneous tissues of all the incisions to aid with postoperative anesthesia. Skin was reapproximated with 4-0 running subcuticular suture and a large midline incision and interrupted 4-0 Monocryl interrupted in the port sites. Dermabond was then placed on the incisions. The patient was awoke from anesthesia and transferred in stable condition to the post anesthesia care unit.  Dr. Erlene Quan provided necessary assistance to perform this procedure. She assisted with running the laparoscopic camera as well as providing extra laparoscopic assistance through additional ports. Completing this procedure would have been impossible without her assistance.  Plan: The patient will be admitted to the floor overnight for postoperative recovery.  Baruch Gouty, M.D.

## 2016-03-08 NOTE — Transfer of Care (Signed)
Immediate Anesthesia Transfer of Care Note  Patient: Frank Bartlett  Procedure(s) Performed: Procedure(s): HAND ASSISTED LAPAROSCOPIC NEPHRECTOMY (Left)  Patient Location: PACU  Anesthesia Type:General  Level of Consciousness: sedated and responds to stimulation  Airway & Oxygen Therapy: Patient Spontanous Breathing and Patient connected to face mask oxygen  Post-op Assessment: Report given to RN and Post -op Vital signs reviewed and stable  Post vital signs: Reviewed and stable  Last Vitals:  Vitals:   03/08/16 0558 03/08/16 1146  BP: (!) 178/97 92/63  Pulse: (!) 52 79  Resp: 16 (!) 21  Temp: 36.9 C (!) 35.9 C    Last Pain:  Vitals:   03/08/16 0558  TempSrc: Tympanic         Complications: No apparent anesthesia complications

## 2016-03-09 LAB — CBC
HEMATOCRIT: 33.7 % — AB (ref 40.0–52.0)
HEMOGLOBIN: 11.6 g/dL — AB (ref 13.0–18.0)
MCH: 31.6 pg (ref 26.0–34.0)
MCHC: 34.4 g/dL (ref 32.0–36.0)
MCV: 91.8 fL (ref 80.0–100.0)
Platelets: 474 10*3/uL — ABNORMAL HIGH (ref 150–440)
RBC: 3.67 MIL/uL — AB (ref 4.40–5.90)
RDW: 13.7 % (ref 11.5–14.5)
WBC: 10.4 10*3/uL (ref 3.8–10.6)

## 2016-03-09 LAB — TYPE AND SCREEN
ABO/RH(D): A POS
ANTIBODY SCREEN: NEGATIVE
UNIT DIVISION: 0
UNIT DIVISION: 0
Unit division: 0
Unit division: 0

## 2016-03-09 LAB — BASIC METABOLIC PANEL
ANION GAP: 7 (ref 5–15)
BUN: 19 mg/dL (ref 6–20)
CO2: 25 mmol/L (ref 22–32)
Calcium: 8.2 mg/dL — ABNORMAL LOW (ref 8.9–10.3)
Chloride: 104 mmol/L (ref 101–111)
Creatinine, Ser: 1.44 mg/dL — ABNORMAL HIGH (ref 0.61–1.24)
GFR calc Af Amer: 49 mL/min — ABNORMAL LOW (ref >60)
GFR calc non Af Amer: 42 mL/min — ABNORMAL LOW (ref >60)
GLUCOSE: 143 mg/dL — AB (ref 65–99)
POTASSIUM: 4.3 mmol/L (ref 3.5–5.1)
Sodium: 136 mmol/L (ref 135–145)

## 2016-03-09 MED ORDER — BOOST / RESOURCE BREEZE PO LIQD
1.0000 | Freq: Three times a day (TID) | ORAL | Status: DC
  Administered 2016-03-09 – 2016-03-10 (×5): 1 via ORAL

## 2016-03-09 MED ORDER — HYDROCODONE-ACETAMINOPHEN 5-325 MG PO TABS: 1.0000 | ORAL_TABLET | ORAL | 0 refills | Status: DC | PRN

## 2016-03-09 NOTE — Clinical Social Work Note (Signed)
Clinical Social Work Assessment  Patient Details  Name: Frank Bartlett MRN: [id number] Date of Birth: 1926-04-14  Date of referral:  03/09/16               Reason for consult:  Facility Placement                Permission sought to share information with:  Facility Sport and exercise psychologist, Family Supports Permission granted to share information::  Yes, Release of Information Signed  Name::        Agency::     Relationship::     Contact Information:     Housing/Transportation Living arrangements for the past 2 months:  [place] of Information:  Patient Patient Interpreter Needed:  None Criminal Activity/Legal Involvement Pertinent to Current Situation/Hospitalization:  No - Comment as needed Significant Relationships:  None Lives with:  Self Do you feel safe going back to the place where you live?  Yes Need for family participation in patient care:  No (Coment)  Care giving concerns:  Patient resides at [facility].   Social Worker assessment / plan:  CSW informed by Seth Bake at [facility] late yesterday that patient wanted to come over to healthcare building at discharge. CSW met with patient and his two sons this morning and patient confirmed that this is what he wanted. Patient was in a good mood and smiling during our conversation.   Employment status:  Retired Nurse, adult PT Recommendations:  Not assessed at this time Information / Referral to community resources:     Patient/Family's Response to care:  Patient/family expressed appreciation for CSW assistance.  Patient/Family's Understanding of and Emotional Response to Diagnosis, Current Treatment, and Prognosis:  Patient/family were very cordial and believe that patient going to healthcare is the best plan at this time.  Emotional Assessment Appearance:  Appears younger than stated age Attitude/Demeanor/Rapport:   (pleasant and  cooperative) Affect (typically observed):  Accepting, Adaptable, Calm, Happy Orientation:  Oriented to Self, Oriented to Place, Oriented to  Time, Oriented to Situation Alcohol / Substance use:  Not Applicable Psych involvement (Current and /or in the community):  No (Comment)  Discharge Needs  Concerns to be addressed:  Care Coordination Readmission within the last 30 days:  No Current discharge risk:  None Barriers to Discharge:  No Barriers Identified   Shela Leff, LCSW 03/09/2016, 10:26 AM

## 2016-03-09 NOTE — Care Management Important Message (Signed)
Important Message  Patient Details  Name: Frank Bartlett MRN: [id number] Date of Birth: May 19, 1925   Medicare Important Message Given:  Yes    Beverly Sessions, RN 03/09/2016, 1:32 PM

## 2016-03-09 NOTE — Evaluation (Signed)
Physical Therapy Evaluation Patient Details Name: OMRI CAPONI MRN: [id number] DOB: 1925-12-24 Today's Date: 03/09/2016   History of Present Illness  82 y/o male with L renal mass, on 11/2 had laproscopic nephrotomy.    Clinical Impression  Pt did very well with ambulation showing good confidence and safety.  He did have some minimal fatigue with 350 ft of walking with FWW, but generally did very well.  He did have some increased pain and hesitancy with getting to/from supine secondary to abdominal pain but ultimately he was safe and effective t/o the session.     Follow Up Recommendations Home health PT    Equipment Recommendations       Recommendations for Other Services       Precautions / Restrictions Precautions Precautions: Fall Restrictions Weight Bearing Restrictions: No      Mobility  Bed Mobility Overal bed mobility: Modified Independent             General bed mobility comments: Pt needing heavy UE use of rails secondary to abdominal/surgical pain  Transfers Overall transfer level: Independent Equipment used: Rolling walker (2 wheeled)             General transfer comment: Pt able to rise to standing without signficant cuing and shows good safety/awareness  Ambulation/Gait Ambulation/Gait assistance: Modified independent (Device/Increase time) Ambulation Distance (Feet): 350 Feet Assistive device: Rolling walker (2 wheeled)       General Gait Details: Pt reports he needed the walker secondary to pain, but he was not overly reliant on it and was able to maintain consistent and confident cadence with forward walker momentum, minimal fatigue and good overall safety.  Stairs            Wheelchair Mobility    Modified Rankin (Stroke Patients Only)       Balance Overall balance assessment: No apparent balance deficits (not formally assessed)                                           Pertinent Vitals/Pain Pain  Assessment: 0-10 (minimal pain at rest) Pain Score: 8  Pain Location: abdominal incision site    Home Living Family/patient expects to be discharged to:: Assisted living               Home Equipment: Walker - 4 wheels Additional Comments: Pt at [facility], wife is at [employer] care portion.    Prior Function Level of Independence: Independent         Comments: Until recently he had apparently been able to walk for >1 for exercise, ran errands, and was generally active     Hand Dominance        Extremity/Trunk Assessment   Upper Extremity Assessment: Overall WFL for tasks assessed           Lower Extremity Assessment: Overall WFL for tasks assessed         Communication   Communication: No difficulties  Cognition Arousal/Alertness: Awake/alert Behavior During Therapy: WFL for tasks assessed/performed Overall Cognitive Status: Within Functional Limits for tasks assessed                      General Comments      Exercises     Assessment/Plan    PT Assessment Patient needs continued PT services  PT Problem List Decreased strength;Pain;Decreased mobility;Decreased activity tolerance  PT Treatment Interventions DME instruction;Gait training;Therapeutic activities;Functional mobility training;Therapeutic exercise;Balance training    PT Goals (Current goals can be found in the Care Plan section)  Acute Rehab PT Goals Patient Stated Goal: "get back to my normal" PT Goal Formulation: With patient Time For Goal Achievement: 03/23/16 Potential to Achieve Goals: Good    Frequency Min 2X/week   Barriers to discharge        Co-evaluation               End of Session Equipment Utilized During Treatment: Gait belt Activity Tolerance: Patient tolerated treatment well Patient left: with bed alarm set;with call bell/phone within reach (pt wanting to take a nap post PT session) Nurse Communication: Mobility  status         Time: [id number] PT Time Calculation (min) (ACUTE ONLY): 25 min   Charges:   PT Evaluation $PT Eval Low Complexity: 1 Procedure     PT G CodesKreg Shropshire, DPT 03/09/2016, 3:45 PM

## 2016-03-09 NOTE — Progress Notes (Signed)
Patient slow to wake up after anesthesia. Doing well now. Minimal PO intake. Mild nausea. No vomiting Pain controlled No ambulation/IS No flatus/ BM  Vitals:   03/08/16 1615 03/08/16 1809 03/08/16 2045 03/09/16 0526  BP: (!) 161/85 (!) 171/89 (!) 158/80 (!) 153/91  Pulse:  89 88 69  Resp:   16 (!) 22  Temp:   98.1 F (36.7 C) 97.8 F (36.6 C)  TempSrc:   Oral Oral  SpO2:   100% 99%  Weight:      Height:       I/O last 3 completed shifts: In: 3602.8 [I.V.:3602.8] Out: 2300 [Urine:2150; Blood:150] Total I/O In: 273.7 [I.V.:273.7] Out: -   NAD Soft approp T mild D Inc c/d/i Foley clear  CBC    Component Value Date/Time   WBC 10.4 03/09/2016 0450   RBC 3.67 (L) 03/09/2016 0450   HGB 11.6 (L) 03/09/2016 0450   HCT 33.7 (L) 03/09/2016 0450   PLT 474 (H) 03/09/2016 0450   MCV 91.8 03/09/2016 0450   MCH 31.6 03/09/2016 0450   MCHC 34.4 03/09/2016 0450   RDW 13.7 03/09/2016 0450   LYMPHSABS 0.4 (L) 02/22/2016 1147   MONOABS 1.3 (H) 02/22/2016 1147   EOSABS 0.0 02/22/2016 1147   BASOSABS 0.0 02/22/2016 1147   BMP Latest Ref Rng & Units 03/09/2016 02/29/2016 02/22/2016  Glucose 65 - 99 mg/dL 143(H) 88 108(H)  BUN 6 - 20 mg/dL 19 18 27(H)  Creatinine 0.61 - 1.24 mg/dL 1.44(H) 1.00 1.40(H)  Sodium 135 - 145 mmol/L 136 134(L) 134(L)  Potassium 3.5 - 5.1 mmol/L 4.3 3.7 3.8  Chloride 101 - 111 mmol/L 104 96(L) 98(L)  CO2 22 - 32 mmol/L 25 27 23   Calcium 8.9 - 10.3 mg/dL 8.2(L) 8.9 8.9    POD 1 L hand assist lap nx. Progressing as expected -continue clears for now. Adv diet later today vs tomorrow -IVF -pain control -repeat AM labs -PT consult - patient with mobility issues at baseline -IS -likely discharge tomorrow

## 2016-03-09 NOTE — Clinical Social Work Note (Signed)
Physician has informed RN CM that patient may discharge tomorrow. Patient is to go to [facility] at discharge. Attending physician will need to dictate a discharge summary, sign fl2, and write prescriptions for all controlled substances. Shela Leff MSW,LCSW [phone]

## 2016-03-09 NOTE — NC FL2 (Signed)
[place] LEVEL OF CARE SCREENING TOOL     IDENTIFICATION  Patient Name: Frank Bartlett Birthdate: 1925-10-13 Sex: male Admission Date (Current Location): 03/08/2016  [place] and [place] Number:  Engineering geologist and Address:  [facility], [address], [place], [place] [address]      Provider Number: [id number]  Attending Physician Name and Address:  Nickie Retort, MD  Relative Name and Phone Number:       Current Level of Care: Hospital Recommended Level of Care: [place] Prior Approval Number:    Date Approved/Denied:   PASRR Number: [id number]  Discharge Plan: SNF    Current Diagnoses: Patient Active Problem List   Diagnosis Date Noted  . Left renal mass 03/08/2016  . Acute renal failure ([place]) 07/01/2015  . C. difficile colitis 07/01/2015  . Generalized weakness 07/01/2015  . Hypokalemia 07/01/2015  . Elevated transaminase level 07/01/2015  . Leukocytosis 07/01/2015  . Anemia 07/01/2015  . Hyponatremia 06/29/2015    Orientation RESPIRATION BLADDER Height & Weight     Self, Time, Situation, Place  Normal, O2 (2 liters) Incontinent Weight: 160 lb (72.6 kg) Height:  5\' 9"  (175.3 cm)  BEHAVIORAL SYMPTOMS/MOOD NEUROLOGICAL BOWEL NUTRITION STATUS   (none)  (none) Incontinent Diet (currently clear liquid to be advanced)  AMBULATORY STATUS COMMUNICATION OF NEEDS Skin   Limited Assist Verbally Surgical wounds                       Personal Care Assistance Level of Assistance  Bathing, Feeding, Dressing Bathing Assistance: Limited assistance Feeding assistance: Limited assistance Dressing Assistance: Limited assistance     Functional Limitations Info             SPECIAL CARE FACTORS FREQUENCY  PT (By licensed PT)                    Contractures Contractures Info: Not present    Additional Factors Info  Code Status, Allergies Code Status Info: full Allergies Info:  nka           Current Medications (03/09/2016):  This is the current hospital active medication list Current Facility-Administered Medications  Medication Dose Route Frequency Provider Last Rate Last Dose  . 0.9 %  sodium chloride infusion   Intravenous Continuous Nickie Retort, MD 100 mL/hr at 03/09/16 0156    . docusate sodium (COLACE) capsule 100 mg  100 mg Oral BID Nickie Retort, MD   100 mg at 03/09/16 [id number]  . feeding supplement (BOOST / RESOURCE BREEZE) liquid 1 Container  1 Container Oral TID BM Nickie Retort, MD   1 Container at 03/09/16 [phone]  . heparin injection 5,000 Units  5,000 Units Subcutaneous Q8H Nickie Retort, MD   5,000 Units at 03/09/16 0512  . hydrALAZINE (APRESOLINE) injection 10 mg  10 mg Intravenous Q6H PRN Nickie Retort, MD   10 mg at 03/08/16 1511  . lisinopril (PRINIVIL,ZESTRIL) tablet 20 mg  20 mg Oral Daily Nickie Retort, MD   20 mg at 03/09/16 [id number]   And  . hydrochlorothiazide (MICROZIDE) capsule 12.5 mg  12.5 mg Oral Daily Nickie Retort, MD   12.5 mg at 03/09/16 0752  . HYDROcodone-acetaminophen (NORCO/VICODIN) 5-325 MG per tablet 1-2 tablet  1-2 tablet Oral Q4H PRN Nickie Retort, MD   2 tablet at 03/09/16 1003  . levothyroxine (SYNTHROID, LEVOTHROID) tablet 112 mcg  112 mcg Oral QAC  breakfast Nickie Retort, MD   112 mcg at 03/09/16 0751  . morphine 2 MG/ML injection 2-4 mg  2-4 mg Intravenous Q2H PRN Nickie Retort, MD   2 mg at 03/09/16 0509  . ondansetron (ZOFRAN) injection 4 mg  4 mg Intravenous Q4H PRN Nickie Retort, MD   4 mg at 03/08/16 1701     Discharge Medications: Please see discharge summary for a list of discharge medications.  Relevant Imaging Results:  Relevant Lab Results:   Additional Information ss: [id number]  Shela Leff, LCSW

## 2016-03-10 LAB — BASIC METABOLIC PANEL
Anion gap: 4 — ABNORMAL LOW (ref 5–15)
BUN: 17 mg/dL (ref 6–20)
CHLORIDE: 102 mmol/L (ref 101–111)
CO2: 28 mmol/L (ref 22–32)
Calcium: 8.2 mg/dL — ABNORMAL LOW (ref 8.9–10.3)
Creatinine, Ser: 1.33 mg/dL — ABNORMAL HIGH (ref 0.61–1.24)
GFR calc Af Amer: 54 mL/min — ABNORMAL LOW (ref >60)
GFR calc non Af Amer: 46 mL/min — ABNORMAL LOW (ref >60)
GLUCOSE: 97 mg/dL (ref 65–99)
POTASSIUM: 4.1 mmol/L (ref 3.5–5.1)
Sodium: 134 mmol/L — ABNORMAL LOW (ref 135–145)

## 2016-03-10 LAB — CBC
HCT: 29.2 % — ABNORMAL LOW (ref 40.0–52.0)
HEMOGLOBIN: 10.4 g/dL — AB (ref 13.0–18.0)
MCH: 32.4 pg (ref 26.0–34.0)
MCHC: 35.5 g/dL (ref 32.0–36.0)
MCV: 91.3 fL (ref 80.0–100.0)
Platelets: 345 10*3/uL (ref 150–440)
RBC: 3.2 MIL/uL — AB (ref 4.40–5.90)
RDW: 13.4 % (ref 11.5–14.5)
WBC: 8.2 10*3/uL (ref 3.8–10.6)

## 2016-03-10 LAB — PREPARE RBC (CROSSMATCH)

## 2016-03-10 MED ORDER — SENNOSIDES-DOCUSATE SODIUM 8.6-50 MG PO TABS
1.0000 | ORAL_TABLET | Freq: Two times a day (BID) | ORAL | Status: DC
  Administered 2016-03-10: 1 via ORAL
  Filled 2016-03-10: qty 1

## 2016-03-10 MED ORDER — SENNOSIDES-DOCUSATE SODIUM 8.6-50 MG PO TABS: 1.0000 | ORAL_TABLET | Freq: Two times a day (BID) | ORAL | 0 refills | Status: DC

## 2016-03-10 NOTE — Clinical Social Work Note (Signed)
Patient to dc to [facility] for STR via transport by family. The family and facility are aware. CSW will con't to follow pending any additional dc needs.  [name], MSW, LCSW-A [phone]

## 2016-03-10 NOTE — Progress Notes (Signed)
Physical Therapy Treatment Patient Details Name: Frank Bartlett MRN: [id number] DOB: 1925/12/30 Today's Date: 03/10/2016    History of Present Illness 82 y/o male with L renal mass, on 11/2 had laproscopic nephrotomy.      PT Comments    Patient requiring increased physical assist (esp with bed mobility) this date due to increased post-op soreness.  Unable to complete sit/supine with less than mod assist from flat surface with rails removed (to simulate home environment); appears less steady/more reliant on RW with gait efforts and demonstrates overall decline in activity tolerance compared to previous date. Given changes noted above, recommend consideration of STR upon discharge from acute hospitalization, as patient unable to demonstrate ability to negotiate home environment independently at discharge.  Patient/son aware of change in recommendations and in agreement. RN/MD/RNCM/CSW informed and aware.  Of note, Patient noted with end-range resting nystagmus bilat (L > R); denies visual changes, diplopia or dizziness with movement.  Able to converge appropriately; no focal weakness/sensory deficit reported in extremities, no change in overall functional ability since initial evaluation.  Patient/son unaware if this finding is baseline for patient or not; RN/MD informed and aware.   Follow Up Recommendations  SNF     Equipment Recommendations       Recommendations for Other Services       Precautions / Restrictions Precautions Precautions: Fall Restrictions Weight Bearing Restrictions: No    Mobility  Bed Mobility Overal bed mobility: Needs Assistance Bed Mobility: Supine to Sit;Sit to Supine     Supine to sit: Mod assist Sit to supine: Mod assist   General bed mobility comments: from flat surface without use of bedrails (to simulate home environment), requiring mod assist; very painful and guarded, limited comprehension/integration of log rolling technique without  constant assit from therapist  Transfers Overall transfer level: Needs assistance Equipment used: Rolling walker (2 wheeled) Transfers: Sit to/from Stand Sit to Stand: Min guard         General transfer comment: very slow and guarded; unable to tolerate full postural extension due to abdominal pain  Ambulation/Gait Ambulation/Gait assistance: Min assist Ambulation Distance (Feet): 200 Feet Assistive device: Rolling walker (2 wheeled)       General Gait Details: increased reliance on RW this date with persistent forward trunk flexion throughout gait cycle (due to abdominal pain).  Steady without buckling or LOB, but not confident in efforts and unable to attempt without RW   Stairs            Wheelchair Mobility    Modified Rankin (Stroke Patients Only)       Balance Overall balance assessment: Needs assistance Sitting-balance support: No upper extremity supported;Feet supported Sitting balance-Leahy Scale: Good     Standing balance support: Bilateral upper extremity supported Standing balance-Leahy Scale: Fair                      Cognition Arousal/Alertness: Awake/alert Behavior During Therapy: WFL for tasks assessed/performed Overall Cognitive Status: Within Functional Limits for tasks assessed                      Exercises Other Exercises Other Exercises: Educated regarding log-rolling technique and abdominal bracing with bed mobility  (for pain control).  Requires mod assist despite cuing/education and repetition of task.  Patient very guarded and painful, unable to complete with less physical assist at this time.  Would benefit from continued training to reinforce technique and maximize indep/pain control. Other Exercises: Patient  noted with end-range resting nystagmus bilat (L > R); denies visual changes, diplopia or dizziness with movement.  Able to converge appropriately; no focal weakness/sensory deficit reported in extremities, no  change in overall functional ability since initial evaluation.  Patient/son unaware if this finding is baseline for patient or not; RN/MD informed and aware.    General Comments        Pertinent Vitals/Pain Pain Assessment: 0-10 Pain Score: 7  Pain Location: abdomen Pain Descriptors / Indicators: Aching;Grimacing;Guarding;Moaning    Home Living                      Prior Function            PT Goals (current goals can now be found in the care plan section) Acute Rehab PT Goals Patient Stated Goal: "get back to my normal" PT Goal Formulation: With patient Time For Goal Achievement: 03/23/16 Potential to Achieve Goals: Good Progress towards PT goals: Progressing toward goals    Frequency    Min 2X/week      PT Plan Discharge plan needs to be updated    Co-evaluation             End of Session Equipment Utilized During Treatment: Gait belt Activity Tolerance: Patient limited by pain Patient left: in bed;with call bell/phone within reach;with bed alarm set;with family/visitor present     Time: [id number] PT Time Calculation (min) (ACUTE ONLY): 32 min  Charges:  $Gait Training: 8-22 mins $Therapeutic Activity: 8-22 mins                    G Codes:      Isais Klipfel H. Owens Shark, PT, DPT, NCS 03/10/16, 2:59 PM [phone]

## 2016-03-10 NOTE — Care Management Note (Signed)
Case Management Note  Patient Details  Name: Frank Bartlett MRN: [id number] Date of Birth: August 23, 1925  Subjective/Objective:   A second [facility]-PT evaluation was done today and the PT Therapist recommends SNF. Frank Bartlett is discharged to [facility] SNF today.                  Action/Plan:   Expected Discharge Date:  03/18/16               Expected Discharge Plan:     In-House Referral:     Discharge planning Services     Post Acute Care Choice:    Choice offered to:     DME Arranged:    DME Agency:     HH Arranged:    HH Agency:     Status of Service:     If discussed at [employer], dates discussed:    Additional Comments:  Tamula Morrical A, RN 03/10/2016, 2:51 PM

## 2016-03-10 NOTE — Discharge Instructions (Signed)
1- Drain Sites - You may have some mild persistent drainage from old drain site for several days, this is normal. This can be covered with cotton gauze for convenience.  2 - Stiches - Your stitches are all dissolvable. You may notice a "loose thread" at your incisions, these are normal and require no intervention. You may cut them flush to the skin with fingernail clippers if needed for comfort.  3 - Diet - No restrictions  4 - Activity - No heavy lifting / straining (any activities that require valsalva or "bearing down") x 4 weeks. Otherwise, no restrictions.  5 - Bathing - You may shower immediately. Do not take a bath or get into swimming pool where incision sites are submersed in water x 4 weeks.   6 - When to Call the Doctor - Call MD for any fever >102, any acute wound problems, or any severe nausea / vomiting.

## 2016-03-10 NOTE — Discharge Summary (Signed)
Physician Discharge Summary  Patient ID: Frank Bartlett MRN: [id number] DOB/AGE: 05/20/1925 82 y.o.  Admit date: 03/08/2016 Discharge date: 03/10/2016  Admission Diagnoses: Large LEFT Renal Mass  Discharge Diagnoses:  Active Problems:   Left renal mass   Discharged Condition: fair  Hospital Course:   Large LEFT Renal Mass - patient underwent LEFT laparoscopic radical nephrectomy on 11/2, the day of admission, without acute complications. He was admitted to 2nd floor Urology service post-op where he began his vigorous recovery. Given his age he was evaluated by physical therapy and social work and all in agreement for skilled nursing at discharge for help with return to ADL's. By POD 2, they day of discharge, he is ambulatory, pain controlled on PO meds, maintaining PO nutrition, and felt to be adequate for discharge. Hgb  10.4, Cr 1.33 on cay of discharge. Final pathology pending at discharge.   Consults: social work, physical therapy  Significant Diagnostic Studies: labs: as per above  Treatments: surgery: LEFT laparoscopic radical nephrectomy on 03/08/2016  Discharge Exam: Blood pressure (!) 130/58, pulse (!) 50, temperature 98.2 F (36.8 C), temperature source Oral, resp. rate 17, height 5\' 9"  (1.753 m), weight 72.6 kg (160 lb), SpO2 96 %. General appearance: alert, cooperative, appears stated age and son at bedside Eyes: negative Nose: Nares normal. Septum midline. Mucosa normal. No drainage or sinus tenderness. Throat: lips, mucosa, and tongue normal; teeth and gums normal Neck: supple, symmetrical, trachea midline Back: symmetric, no curvature. ROM normal. No CVA tenderness. Resp: non-labored on room air Cardio: Nl rate GI: soft, non-tender; bowel sounds normal; no masses,  no organomegaly Male genitalia: normal Extremities: extremities normal, atraumatic, no cyanosis or edema Pulses: 2+ and symmetric Skin: Skin color, texture, turgor normal. No rashes or  lesions Lymph nodes: Cervical, supraclavicular, and axillary nodes normal. Neurologic: Grossly normal Incision/Wound: recent port and extraction sites c/d/i. No hernias or hematomas.   Disposition: 01-Home or Self Care     Medication List    TAKE these medications   aspirin EC 81 MG tablet Take 81 mg by mouth daily.   feeding supplement (ENSURE ENLIVE) Liqd Take 237 mLs by mouth 2 (two) times daily with a meal. What changed:  when to take this  reasons to take this   HYDROcodone-acetaminophen 5-325 MG tablet Commonly known as:  NORCO/VICODIN Take 1-2 tablets by mouth every 4 (four) hours as needed for moderate pain.   levothyroxine 112 MCG tablet Commonly known as:  SYNTHROID, LEVOTHROID Take 112 mcg by mouth daily before breakfast.   lisinopril-hydrochlorothiazide 20-12.5 MG tablet Commonly known as:  PRINZIDE,ZESTORETIC take 1 tablet by mouth once daily   meloxicam 15 MG tablet Commonly known as:  MOBIC Take 7.5 mg by mouth.   MULTI-VITAMINS Tabs Take by mouth.   RA VITAMIN B-12 TR 1000 MCG Tbcr Generic drug:  Cyanocobalamin Take 1,000 mcg by mouth daily.   senna-docusate 8.6-50 MG tablet Commonly known as:  Senokot-S Take 1 tablet by mouth 2 (two) times daily. While taking strong pain medication to prevent constipation      Follow-up Information    Nickie Retort, MD Follow up in 1 week(s).   Specialty:  Urology Why:  office will call to schedule Contact information: [address] [place] [address] [phone]           Signed: Alexis Frock 03/10/2016, 10:50 AM

## 2016-03-10 NOTE — Clinical Social Work Placement (Signed)
   CLINICAL SOCIAL WORK PLACEMENT  NOTE  Date:  03/10/2016  Patient Details  Name: WILBURT FLEITES MRN: [id number] Date of Birth: 1926-03-03  Clinical Social Work is seeking post-discharge placement for this patient at the [place] level of care (*CSW will initial, date and re-position this form in  chart as items are completed):  Yes   Patient/family provided with [place] Work Department's list of facilities offering this level of care within the geographic area requested by the patient (or if unable, by the patient's family).  Yes   Patient/family informed of their freedom to choose among providers that offer the needed level of care, that participate in Medicare, Medicaid or managed care program needed by the patient, have an available bed and are willing to accept the patient.  Yes   Patient/family informed of [facility]'s ownership interest in [facility] and [facility], as well as of the fact that they are under no obligation to receive care at these facilities.  PASRR submitted to EDS on       PASRR number received on       Existing PASRR number confirmed on 03/09/16     FL2 transmitted to all facilities in geographic area requested by pt/family on 03/09/16     FL2 transmitted to all facilities within larger geographic area on       Patient informed that his/her managed care company has contracts with or will negotiate with certain facilities, including the following:        Yes   Patient/family informed of bed offers received.  Patient chooses bed at  ([place] resident going to [facility])     Physician recommends and patient chooses bed at  (home or snf)    Patient to be transferred to  [facility]) on  .  Patient to be transferred to facility by       Patient family notified on   of transfer.  Name of family member notified:        PHYSICIAN Please sign FL2, Please prepare prescriptions,  Please prepare priority discharge summary, including medications     Additional Comment:    _______________________________________________ Zettie Pho, LCSW 03/10/2016, 3:19 PM

## 2016-03-10 NOTE — Progress Notes (Signed)
Pt A and O x 4. VSS. Pt tolerating diet well. No complaints of pain or nausea. IV removed intact, prescriptions given. Pt voiced understanding of discharge instructions with no further questions. Report called to [facility]. Pt discharged via wheelchair with nurse aide.

## 2016-03-10 NOTE — Care Management Note (Addendum)
Case Management Note  Patient Details  Name: Frank Bartlett MRN: [id number] Date of Birth: 1925-09-14  Subjective/Objective:   Updated Dr Tresa Moore in Urology that [facility]-PT is recommending home with home health PT. This means discharge back to [place] [facility]. Both son Frank Bartlett and Frank Bartlett expressed concern that they thought Frank Mcneff would be discharged to Rehab at [facility]. Dr Tresa Moore has ordered another PT evaluation today for Frank Steenstra.                   Action/Plan:   Expected Discharge Date:  03/18/16               Expected Discharge Plan:     In-House Referral:     Discharge planning Services     Post Acute Care Choice:    Choice offered to:     DME Arranged:    DME Agency:     HH Arranged:    HH Agency:     Status of Service:     If discussed at [employer], dates discussed:    Additional Comments:  Laverne Hursey A, RN 03/10/2016, 11:34 AM

## 2016-03-12 ENCOUNTER — Inpatient Hospital Stay
Admission: EM | Admit: 2016-03-12 | Discharge: 2016-03-17 | DRG: 907 | Disposition: A | Discharge status: Skilled Nursing Facility | Payer: Medicare Other | Attending: Urology | Admitting: Urology

## 2016-03-12 ENCOUNTER — Emergency Department: Payer: Medicare Other

## 2016-03-12 ENCOUNTER — Telehealth: Payer: Self-pay

## 2016-03-12 ENCOUNTER — Encounter: Payer: Self-pay | Admitting: Urology

## 2016-03-12 DIAGNOSIS — Z8249 Family history of ischemic heart disease and other diseases of the circulatory system: Secondary | ICD-10-CM

## 2016-03-12 DIAGNOSIS — R1084 Generalized abdominal pain: Secondary | ICD-10-CM

## 2016-03-12 DIAGNOSIS — T8189XA Other complications of procedures, not elsewhere classified, initial encounter: Principal | ICD-10-CM | POA: Diagnosis present

## 2016-03-12 DIAGNOSIS — E876 Hypokalemia: Secondary | ICD-10-CM | POA: Diagnosis present

## 2016-03-12 DIAGNOSIS — R109 Unspecified abdominal pain: Secondary | ICD-10-CM

## 2016-03-12 DIAGNOSIS — A0472 Enterocolitis due to Clostridium difficile, not specified as recurrent: Secondary | ICD-10-CM | POA: Diagnosis present

## 2016-03-12 DIAGNOSIS — E039 Hypothyroidism, unspecified: Secondary | ICD-10-CM | POA: Diagnosis present

## 2016-03-12 DIAGNOSIS — Z8546 Personal history of malignant neoplasm of prostate: Secondary | ICD-10-CM

## 2016-03-12 DIAGNOSIS — I10 Essential (primary) hypertension: Secondary | ICD-10-CM | POA: Diagnosis present

## 2016-03-12 DIAGNOSIS — Z905 Acquired absence of kidney: Secondary | ICD-10-CM

## 2016-03-12 DIAGNOSIS — Y838 Other surgical procedures as the cause of abnormal reaction of the patient, or of later complication, without mention of misadventure at the time of the procedure: Secondary | ICD-10-CM | POA: Diagnosis present

## 2016-03-12 DIAGNOSIS — K631 Perforation of intestine (nontraumatic): Secondary | ICD-10-CM | POA: Diagnosis not present

## 2016-03-12 DIAGNOSIS — I480 Paroxysmal atrial fibrillation: Secondary | ICD-10-CM | POA: Diagnosis present

## 2016-03-12 DIAGNOSIS — Z66 Do not resuscitate: Secondary | ICD-10-CM | POA: Diagnosis present

## 2016-03-12 DIAGNOSIS — I959 Hypotension, unspecified: Secondary | ICD-10-CM | POA: Diagnosis present

## 2016-03-12 DIAGNOSIS — K668 Other specified disorders of peritoneum: Secondary | ICD-10-CM | POA: Diagnosis present

## 2016-03-12 DIAGNOSIS — I4892 Unspecified atrial flutter: Secondary | ICD-10-CM | POA: Diagnosis present

## 2016-03-12 DIAGNOSIS — R059 Cough, unspecified: Secondary | ICD-10-CM

## 2016-03-12 DIAGNOSIS — Z923 Personal history of irradiation: Secondary | ICD-10-CM

## 2016-03-12 DIAGNOSIS — E785 Hyperlipidemia, unspecified: Secondary | ICD-10-CM | POA: Diagnosis present

## 2016-03-12 DIAGNOSIS — M6281 Muscle weakness (generalized): Secondary | ICD-10-CM

## 2016-03-12 DIAGNOSIS — Z09 Encounter for follow-up examination after completed treatment for conditions other than malignant neoplasm: Secondary | ICD-10-CM

## 2016-03-12 DIAGNOSIS — I4891 Unspecified atrial fibrillation: Secondary | ICD-10-CM

## 2016-03-12 DIAGNOSIS — R001 Bradycardia, unspecified: Secondary | ICD-10-CM | POA: Diagnosis not present

## 2016-03-12 DIAGNOSIS — R05 Cough: Secondary | ICD-10-CM

## 2016-03-12 DIAGNOSIS — N170 Acute kidney failure with tubular necrosis: Secondary | ICD-10-CM | POA: Diagnosis present

## 2016-03-12 LAB — CBC WITH DIFFERENTIAL/PLATELET
BASOS PCT: 1 %
Basophils Absolute: 0.1 10*3/uL (ref 0–0.1)
EOS ABS: 0.1 10*3/uL (ref 0–0.7)
EOS PCT: 1 %
HCT: 38.5 % — ABNORMAL LOW (ref 40.0–52.0)
HEMOGLOBIN: 13.5 g/dL (ref 13.0–18.0)
Lymphocytes Relative: 4 %
Lymphs Abs: 0.5 10*3/uL — ABNORMAL LOW (ref 1.0–3.6)
MCH: 32.2 pg (ref 26.0–34.0)
MCHC: 35.1 g/dL (ref 32.0–36.0)
MCV: 91.8 fL (ref 80.0–100.0)
MONOS PCT: 5 %
Monocytes Absolute: 0.6 10*3/uL (ref 0.2–1.0)
NEUTROS PCT: 89 %
Neutro Abs: 9.7 10*3/uL — ABNORMAL HIGH (ref 1.4–6.5)
PLATELETS: 445 10*3/uL — AB (ref 150–440)
RBC: 4.19 MIL/uL — ABNORMAL LOW (ref 4.40–5.90)
RDW: 13.4 % (ref 11.5–14.5)
WBC: 10.9 10*3/uL — ABNORMAL HIGH (ref 3.8–10.6)

## 2016-03-12 LAB — COMPREHENSIVE METABOLIC PANEL
ALBUMIN: 3.2 g/dL — AB (ref 3.5–5.0)
ALK PHOS: 242 U/L — AB (ref 38–126)
ALT: 13 U/L — ABNORMAL LOW (ref 17–63)
ANION GAP: 14 (ref 5–15)
AST: 40 U/L (ref 15–41)
BUN: 33 mg/dL — ABNORMAL HIGH (ref 6–20)
CHLORIDE: 95 mmol/L — AB (ref 101–111)
CO2: 23 mmol/L (ref 22–32)
Calcium: 9.3 mg/dL (ref 8.9–10.3)
Creatinine, Ser: 2.11 mg/dL — ABNORMAL HIGH (ref 0.61–1.24)
GFR calc Af Amer: 31 mL/min — ABNORMAL LOW (ref >60)
GFR calc non Af Amer: 27 mL/min — ABNORMAL LOW (ref >60)
GLUCOSE: 133 mg/dL — AB (ref 65–99)
POTASSIUM: 4 mmol/L (ref 3.5–5.1)
SODIUM: 132 mmol/L — AB (ref 135–145)
Total Bilirubin: 0.7 mg/dL (ref 0.3–1.2)
Total Protein: 7.5 g/dL (ref 6.5–8.1)

## 2016-03-12 LAB — SURGICAL PATHOLOGY

## 2016-03-12 LAB — LIPASE, BLOOD: Lipase: 39 U/L (ref 11–51)

## 2016-03-12 NOTE — Telephone Encounter (Signed)
Dr. Martie Round from radiologist department called w/ call report from Surgery on 03/08/16. 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

## 2016-03-12 NOTE — ED Triage Notes (Addendum)
Pt arrived by EMS from [facility] with c/o lower abdominal pain and hypotension. EMS reports pt had left kidney removed 03/08/16, has been on Norco, was given stool softener today, pt now c/o lower abdominal pain. Pts BP on arrival 97/54.

## 2016-03-12 NOTE — ED Notes (Signed)
Pt having runny BM at this time, post medication for constipation given by facility.

## 2016-03-13 ENCOUNTER — Encounter: Payer: Self-pay | Admitting: Anesthesiology

## 2016-03-13 ENCOUNTER — Emergency Department: Payer: Medicare Other | Admitting: Anesthesiology

## 2016-03-13 ENCOUNTER — Encounter
Admission: EM | Disposition: A | Discharge status: Skilled Nursing Facility | Payer: Self-pay | Source: Home / Self Care | Attending: Urology

## 2016-03-13 ENCOUNTER — Inpatient Hospital Stay: Payer: Medicare Other

## 2016-03-13 DIAGNOSIS — I480 Paroxysmal atrial fibrillation: Secondary | ICD-10-CM | POA: Diagnosis present

## 2016-03-13 DIAGNOSIS — I4891 Unspecified atrial fibrillation: Secondary | ICD-10-CM

## 2016-03-13 DIAGNOSIS — Z8249 Family history of ischemic heart disease and other diseases of the circulatory system: Secondary | ICD-10-CM | POA: Diagnosis not present

## 2016-03-13 DIAGNOSIS — Z905 Acquired absence of kidney: Secondary | ICD-10-CM | POA: Diagnosis not present

## 2016-03-13 DIAGNOSIS — R059 Cough, unspecified: Secondary | ICD-10-CM

## 2016-03-13 DIAGNOSIS — R05 Cough: Secondary | ICD-10-CM | POA: Diagnosis not present

## 2016-03-13 DIAGNOSIS — Z923 Personal history of irradiation: Secondary | ICD-10-CM | POA: Diagnosis not present

## 2016-03-13 DIAGNOSIS — K668 Other specified disorders of peritoneum: Secondary | ICD-10-CM | POA: Diagnosis present

## 2016-03-13 DIAGNOSIS — Z8546 Personal history of malignant neoplasm of prostate: Secondary | ICD-10-CM | POA: Diagnosis not present

## 2016-03-13 DIAGNOSIS — I10 Essential (primary) hypertension: Secondary | ICD-10-CM | POA: Diagnosis present

## 2016-03-13 DIAGNOSIS — A0472 Enterocolitis due to Clostridium difficile, not specified as recurrent: Secondary | ICD-10-CM | POA: Diagnosis present

## 2016-03-13 DIAGNOSIS — T8189XA Other complications of procedures, not elsewhere classified, initial encounter: Secondary | ICD-10-CM | POA: Diagnosis present

## 2016-03-13 DIAGNOSIS — E876 Hypokalemia: Secondary | ICD-10-CM | POA: Diagnosis present

## 2016-03-13 DIAGNOSIS — I959 Hypotension, unspecified: Secondary | ICD-10-CM | POA: Diagnosis present

## 2016-03-13 DIAGNOSIS — N170 Acute kidney failure with tubular necrosis: Secondary | ICD-10-CM | POA: Diagnosis present

## 2016-03-13 DIAGNOSIS — I4892 Unspecified atrial flutter: Secondary | ICD-10-CM | POA: Diagnosis present

## 2016-03-13 DIAGNOSIS — K631 Perforation of intestine (nontraumatic): Secondary | ICD-10-CM | POA: Diagnosis present

## 2016-03-13 DIAGNOSIS — R001 Bradycardia, unspecified: Secondary | ICD-10-CM | POA: Diagnosis not present

## 2016-03-13 DIAGNOSIS — R1084 Generalized abdominal pain: Secondary | ICD-10-CM | POA: Diagnosis not present

## 2016-03-13 DIAGNOSIS — Z66 Do not resuscitate: Secondary | ICD-10-CM | POA: Diagnosis present

## 2016-03-13 DIAGNOSIS — E039 Hypothyroidism, unspecified: Secondary | ICD-10-CM | POA: Diagnosis present

## 2016-03-13 DIAGNOSIS — Y838 Other surgical procedures as the cause of abnormal reaction of the patient, or of later complication, without mention of misadventure at the time of the procedure: Secondary | ICD-10-CM | POA: Diagnosis present

## 2016-03-13 DIAGNOSIS — E785 Hyperlipidemia, unspecified: Secondary | ICD-10-CM | POA: Diagnosis present

## 2016-03-13 HISTORY — PX: LAPAROTOMY: SHX154

## 2016-03-13 LAB — BASIC METABOLIC PANEL
ANION GAP: 8 (ref 5–15)
BUN: 34 mg/dL — AB (ref 6–20)
CHLORIDE: 103 mmol/L (ref 101–111)
CO2: 21 mmol/L — ABNORMAL LOW (ref 22–32)
Calcium: 7.6 mg/dL — ABNORMAL LOW (ref 8.9–10.3)
Creatinine, Ser: 2.09 mg/dL — ABNORMAL HIGH (ref 0.61–1.24)
GFR calc Af Amer: 31 mL/min — ABNORMAL LOW (ref >60)
GFR, EST NON AFRICAN AMERICAN: 27 mL/min — AB (ref >60)
GLUCOSE: 155 mg/dL — AB (ref 65–99)
POTASSIUM: 3.8 mmol/L (ref 3.5–5.1)
SODIUM: 132 mmol/L — AB (ref 135–145)

## 2016-03-13 LAB — CBC
HCT: 33.4 % — ABNORMAL LOW (ref 40.0–52.0)
HEMOGLOBIN: 11.5 g/dL — AB (ref 13.0–18.0)
MCH: 31.6 pg (ref 26.0–34.0)
MCHC: 34.5 g/dL (ref 32.0–36.0)
MCV: 91.6 fL (ref 80.0–100.0)
PLATELETS: 329 10*3/uL (ref 150–440)
RBC: 3.64 MIL/uL — AB (ref 4.40–5.90)
RDW: 13.2 % (ref 11.5–14.5)
WBC: 9.8 10*3/uL (ref 3.8–10.6)

## 2016-03-13 LAB — TROPONIN I
TROPONIN I: 0.04 ng/mL — AB (ref <0.03)
TROPONIN I: 0.03 ng/mL — AB (ref <0.03)
Troponin I: <0.03 ng/mL (ref <0.03)

## 2016-03-13 LAB — MRSA PCR SCREENING: MRSA BY PCR: NEGATIVE

## 2016-03-13 LAB — GLUCOSE, CAPILLARY: Glucose-Capillary: 137 mg/dL — ABNORMAL HIGH (ref 65–99)

## 2016-03-13 SURGERY — LAPAROTOMY, EXPLORATORY
Anesthesia: General

## 2016-03-13 MED ORDER — BUPIVACAINE LIPOSOME 1.3 % IJ SUSP
INTRAMUSCULAR | Status: DC | PRN
  Administered 2016-03-13: 20 mL

## 2016-03-13 MED ORDER — ENSURE ENLIVE PO LIQD: 237.0000 mL | Freq: Two times a day (BID) | ORAL | Status: DC | PRN

## 2016-03-13 MED ORDER — OXYCODONE HCL 5 MG/5ML PO SOLN: 5.0000 mg | Freq: Once | ORAL | Status: DC | PRN

## 2016-03-13 MED ORDER — ASPIRIN EC 81 MG PO TBEC
81.0000 mg | DELAYED_RELEASE_TABLET | Freq: Every day | ORAL | Status: DC
  Administered 2016-03-14 – 2016-03-17 (×4): 81 mg via ORAL
  Filled 2016-03-13 (×4): qty 1

## 2016-03-13 MED ORDER — SUGAMMADEX SODIUM 200 MG/2ML IV SOLN
INTRAVENOUS | Status: DC | PRN
  Administered 2016-03-13: 145.2 mg via INTRAVENOUS

## 2016-03-13 MED ORDER — ACETAMINOPHEN 325 MG PO TABS: 650.0000 mg | ORAL_TABLET | ORAL | Status: DC | PRN

## 2016-03-13 MED ORDER — SODIUM CHLORIDE 0.9 % IV SOLN
INTRAVENOUS | Status: DC
  Administered 2016-03-13 (×2): via INTRAVENOUS

## 2016-03-13 MED ORDER — SODIUM CHLORIDE 0.45 % IV SOLN
INTRAVENOUS | Status: DC
  Administered 2016-03-13 – 2016-03-14 (×2): via INTRAVENOUS

## 2016-03-13 MED ORDER — TRAMADOL HCL 50 MG PO TABS
50.0000 mg | ORAL_TABLET | Freq: Four times a day (QID) | ORAL | Status: DC | PRN
Start: 2016-03-13 — End: 2016-03-17
  Administered 2016-03-15: 50 mg via ORAL
  Filled 2016-03-13: qty 1

## 2016-03-13 MED ORDER — ETOMIDATE 2 MG/ML IV SOLN
INTRAVENOUS | Status: DC | PRN
  Administered 2016-03-13: 20 mg via INTRAVENOUS

## 2016-03-13 MED ORDER — ENOXAPARIN SODIUM 30 MG/0.3ML ~~LOC~~ SOLN
30.0000 mg | SUBCUTANEOUS | Status: DC
  Administered 2016-03-14 – 2016-03-17 (×4): 30 mg via SUBCUTANEOUS
  Filled 2016-03-13 (×4): qty 0.3

## 2016-03-13 MED ORDER — PIPERACILLIN-TAZOBACTAM 3.375 G IVPB
INTRAVENOUS | Status: AC
  Administered 2016-03-13: 3.375 g via INTRAVENOUS
  Filled 2016-03-13: qty 50

## 2016-03-13 MED ORDER — SODIUM CHLORIDE 0.9 % IV SOLN
INTRAVENOUS | Status: DC | PRN
  Administered 2016-03-13: 02:00:00 via INTRAVENOUS

## 2016-03-13 MED ORDER — PIPERACILLIN-TAZOBACTAM 3.375 G IVPB
3.3750 g | Freq: Three times a day (TID) | INTRAVENOUS | Status: DC
  Administered 2016-03-13: 3.375 g via INTRAVENOUS
  Filled 2016-03-13: qty 50

## 2016-03-13 MED ORDER — BISACODYL 10 MG RE SUPP: 10.0000 mg | Freq: Every day | RECTAL | Status: DC | PRN

## 2016-03-13 MED ORDER — PHENYLEPHRINE HCL 10 MG/ML IJ SOLN
INTRAMUSCULAR | Status: DC | PRN
  Administered 2016-03-13: 200 ug via INTRAVENOUS
  Administered 2016-03-13 (×2): 100 ug via INTRAVENOUS

## 2016-03-13 MED ORDER — ROCURONIUM BROMIDE 100 MG/10ML IV SOLN
INTRAVENOUS | Status: DC | PRN
  Administered 2016-03-13: 30 mg via INTRAVENOUS
  Administered 2016-03-13: 20 mg via INTRAVENOUS

## 2016-03-13 MED ORDER — SODIUM CHLORIDE 0.9 % IV SOLN
INTRAVENOUS | Status: DC | PRN
  Administered 2016-03-13: 03:00:00 via INTRAVENOUS

## 2016-03-13 MED ORDER — FENTANYL CITRATE (PF) 100 MCG/2ML IJ SOLN: 25.0000 ug | INTRAMUSCULAR | Status: DC | PRN

## 2016-03-13 MED ORDER — FENTANYL CITRATE (PF) 100 MCG/2ML IJ SOLN
INTRAMUSCULAR | Status: DC | PRN
  Administered 2016-03-13: 100 ug via INTRAVENOUS
  Administered 2016-03-13 (×3): 50 ug via INTRAVENOUS

## 2016-03-13 MED ORDER — MORPHINE SULFATE (PF) 2 MG/ML IV SOLN: 2.0000 mg | INTRAVENOUS | Status: DC | PRN

## 2016-03-13 MED ORDER — SUCCINYLCHOLINE CHLORIDE 20 MG/ML IJ SOLN
INTRAMUSCULAR | Status: DC | PRN
  Administered 2016-03-13: 100 mg via INTRAVENOUS

## 2016-03-13 MED ORDER — SODIUM CHLORIDE 0.9 % IV BOLUS (SEPSIS)
500.0000 mL | Freq: Once | INTRAVENOUS | Status: AC
  Administered 2016-03-13: 500 mL via INTRAVENOUS

## 2016-03-13 MED ORDER — OXYCODONE-ACETAMINOPHEN 5-325 MG PO TABS: 1.0000 | ORAL_TABLET | ORAL | Status: DC | PRN

## 2016-03-13 MED ORDER — LEVOTHYROXINE SODIUM 112 MCG PO TABS
112.0000 ug | ORAL_TABLET | Freq: Every day | ORAL | Status: DC
  Administered 2016-03-14 – 2016-03-17 (×4): 112 ug via ORAL
  Filled 2016-03-13 (×4): qty 1

## 2016-03-13 MED ORDER — ONDANSETRON HCL 4 MG/2ML IJ SOLN
4.0000 mg | Freq: Four times a day (QID) | INTRAMUSCULAR | Status: DC | PRN
Start: 2016-03-13 — End: 2016-03-17

## 2016-03-13 MED ORDER — OXYCODONE HCL 5 MG PO TABS: 5.0000 mg | ORAL_TABLET | Freq: Once | ORAL | Status: DC | PRN

## 2016-03-13 MED ORDER — PIPERACILLIN-TAZOBACTAM 3.375 G IVPB 30 MIN
3.3750 g | Freq: Once | INTRAVENOUS | Status: AC
  Administered 2016-03-13: 3.375 g via INTRAVENOUS

## 2016-03-13 MED ORDER — ACETAMINOPHEN 10 MG/ML IV SOLN
1000.0000 mg | Freq: Four times a day (QID) | INTRAVENOUS | Status: AC
  Administered 2016-03-13 – 2016-03-14 (×4): 1000 mg via INTRAVENOUS
  Filled 2016-03-13 (×5): qty 100

## 2016-03-13 MED ORDER — MORPHINE SULFATE (PF) 2 MG/ML IV SOLN
1.0000 mg | INTRAVENOUS | Status: DC | PRN
  Administered 2016-03-13 – 2016-03-14 (×2): 2 mg via INTRAVENOUS
  Filled 2016-03-13 (×2): qty 1

## 2016-03-13 MED ORDER — BUPIVACAINE LIPOSOME 1.3 % IJ SUSP
INTRAMUSCULAR | Status: AC
  Filled 2016-03-13: qty 20

## 2016-03-13 SURGICAL SUPPLY — 33 items
CANISTER SUCT 1200ML W/VALVE (MISCELLANEOUS) ×3 IMPLANT
CHLORAPREP W/TINT 10.5 ML (MISCELLANEOUS) ×3 IMPLANT
COVER LIGHT HANDLE STERIS (MISCELLANEOUS) ×3 IMPLANT
DRAPE LAPAROTOMY T 102X78X121 (DRAPES) ×3 IMPLANT
DRSG OPSITE POSTOP 4X12 (GAUZE/BANDAGES/DRESSINGS) IMPLANT
DRSG TEGADERM 4X10 (GAUZE/BANDAGES/DRESSINGS) IMPLANT
ELECT CAUTERY BLADE 6.4 (BLADE) ×3 IMPLANT
ELECT REM PT RETURN 9FT ADLT (ELECTROSURGICAL) ×3
ELECTRODE REM PT RTRN 9FT ADLT (ELECTROSURGICAL) ×1 IMPLANT
GAUZE SPONGE 4X4 12PLY STRL (GAUZE/BANDAGES/DRESSINGS) ×3 IMPLANT
GLOVE SURG SYN 7.0 (GLOVE) ×9 IMPLANT
GLOVE SURG SYN 7.5  E (GLOVE) ×6
GLOVE SURG SYN 7.5 E (GLOVE) ×3 IMPLANT
GOWN STRL REUS W/ TWL LRG LVL3 (GOWN DISPOSABLE) ×3 IMPLANT
GOWN STRL REUS W/TWL LRG LVL3 (GOWN DISPOSABLE) ×6
LABEL OR SOLS (LABEL) IMPLANT
LIGASURE IMPACT 36 18CM CVD LR (INSTRUMENTS) ×3 IMPLANT
NDL SAFETY 22GX1.5 (NEEDLE) ×3 IMPLANT
NS IRRIG 1000ML POUR BTL (IV SOLUTION) ×3 IMPLANT
PACK BASIN MAJOR ARMC (MISCELLANEOUS) ×3 IMPLANT
PACK COLON CLEAN CLOSURE (MISCELLANEOUS) IMPLANT
RELOAD STAPLE SKIN SM 35W (MISCELLANEOUS) ×3 IMPLANT
SEPRAFILM MEMBRANE 5X6 (MISCELLANEOUS) IMPLANT
SLEEVE SCD COMPRESS THIGH MED (MISCELLANEOUS) ×3 IMPLANT
SUT PDS AB 1 TP1 96 (SUTURE) ×6 IMPLANT
SUT PROLENE 0 CT 1 30 (SUTURE) IMPLANT
SUT SILK 2 0 (SUTURE) ×2
SUT SILK 2-0 18XBRD TIE 12 (SUTURE) ×1 IMPLANT
SUT SILK 3-0 (SUTURE) ×3 IMPLANT
SUT VIC AB 3-0 SH 27 (SUTURE) ×2
SUT VIC AB 3-0 SH 27X BRD (SUTURE) ×1 IMPLANT
SYRINGE 10CC LL (SYRINGE) ×3 IMPLANT
TRAY FOLEY W/METER SILVER 16FR (SET/KITS/TRAYS/PACK) ×3 IMPLANT

## 2016-03-13 NOTE — ED Provider Notes (Signed)
Time Seen: Approximately2142  I have reviewed the triage notes  Chief Complaint: Abdominal Pain   History of Present Illness: Frank Bartlett is a 82 y.o. male who had a recent resection of his left kidney secondary to carcinoma. Patient had a radical laparoscopic nephrectomy performed on 03/08/2016. Patient was discharged to [facility] for recovery. The patient was given a stool softener and had been on Norco for pain control. He did describe some crampy abdominal pain and was transported here by EMS from the nursing facility. Patient has not had a fever or any other concerns expressed by the nursing staff.   Past Medical History:  Diagnosis Date  . B12 deficiency   . Bradycardia   . Hyperlipidemia   . Hypertension   . Hypothyroidism   . Osteoarthritis   . Prostate cancer ([place]) 2001   Rad tx's + seed implants    Patient Active Problem List   Diagnosis Date Noted  . Left renal mass 03/08/2016  . Acute renal failure ([place]) 07/01/2015  . C. difficile colitis 07/01/2015  . Generalized weakness 07/01/2015  . Hypokalemia 07/01/2015  . Elevated transaminase level 07/01/2015  . Leukocytosis 07/01/2015  . Anemia 07/01/2015  . Hyponatremia 06/29/2015    Past Surgical History:  Procedure Laterality Date  . CATARACT EXTRACTION, BILATERAL    . COLONOSCOPY    . EXCISIONAL HEMORRHOIDECTOMY    . INSERTION PROSTATE RADIATION SEED    . LAPAROSCOPIC NEPHRECTOMY, HAND ASSISTED Left 03/08/2016   Procedure: HAND ASSISTED LAPAROSCOPIC NEPHRECTOMY;  Surgeon: Nickie Retort, MD;  Location: [facility] ORS;  Service: Urology;  Laterality: Left;    Past Surgical History:  Procedure Laterality Date  . CATARACT EXTRACTION, BILATERAL    . COLONOSCOPY    . EXCISIONAL HEMORRHOIDECTOMY    . INSERTION PROSTATE RADIATION SEED    . LAPAROSCOPIC NEPHRECTOMY, HAND ASSISTED Left 03/08/2016   Procedure: HAND ASSISTED LAPAROSCOPIC NEPHRECTOMY;  Surgeon: Nickie Retort, MD;  Location: [facility] ORS;   Service: Urology;  Laterality: Left;    Current Outpatient Rx  . Order #: [id number] Class: Historical Med  . Order #: [id number] Class: Historical Med  . Order #: [id number] Class: Normal  . Order #: [id number] Class: Print  . Order #: [id number] Class: Historical Med  . Order #: [id number] Class: Historical Med  . Order #: [id number] Class: Historical Med  . Order #: [id number] Class: Historical Med  . Order #: [id number] Class: Print    Allergies:  Patient has no known allergies.  Family History: Family History  Problem Relation Age of Onset  . Hypertension Mother   . Breast cancer Mother   . Prostate cancer Neg Hx     Social History: Social History  Substance Use Topics  . Smoking status: Never Smoker  . Smokeless tobacco: Never Used  . Alcohol use No     Comment: occasionally     Review of Systems:   10 point review of systems was performed and was otherwise negative: Review of systems mainly acquired from the medical record Constitutional: No fever Eyes: No visual disturbances ENT: No sore throat, ear pain Cardiac: No chest pain Respiratory: No shortness of breath, wheezing, or stridor Abdomen: Diffuse crampy abdominal pain with constipation Endocrine: No weight loss, No night sweats Extremities: No peripheral edema, cyanosis Skin: No rashes, easy bruising Neurologic: No focal weakness, trouble with speech or swollowing Urologic: No dysuria, Hematuria, or urinary frequency   Physical Exam:  ED Triage Vitals [03/12/16 2147]  Enc Vitals Group     BP  Pulse      Resp      Temp      Temp src      SpO2      Weight 160 lb (72.6 kg)     Height 5\' 9"  (1.753 m)     Head Circumference      Peak Flow      Pain Score      Pain Loc      Pain Edu?      Excl. in [place]?     General: Awake , Alert , and Oriented times 1, Glasgow Coma Scale 15 Head: Normal cephalic , atraumatic Eyes: Pupils equal , round, reactive to light Nose/Throat: No nasal drainage, patent upper  airway without erythema or exudate.  Neck: Supple, Full range of motion, No anterior adenopathy or palpable thyroid masses Lungs: Clear to ascultation without wheezes , rhonchi, or rales Heart: Regular rate, regular rhythm without murmurs , gallops , or rubs Abdomen: Mild diffuse tenderness without rebound, guarding , or rigidity; bowel sounds are quiet in all 4 quadrants symmetric in all 4 quadrants. No organomegaly .  Postoperative wounds do not appear infected      Extremities: 2 plus symmetric pulses. No edema, clubbing or cyanosis Neurologic: normal ambulation, Motor symmetric without deficits, sensory intact Skin: warm, dry, no rashes   Labs:   All laboratory work was reviewed including any pertinent negatives or positives listed below:  Labs Reviewed  CBC WITH DIFFERENTIAL/PLATELET - Abnormal; Notable for the following:       Result Value   WBC 10.9 (*)    RBC 4.19 (*)    HCT 38.5 (*)    Platelets 445 (*)    Neutro Abs 9.7 (*)    Lymphs Abs 0.5 (*)    All other components within normal limits  COMPREHENSIVE METABOLIC PANEL - Abnormal; Notable for the following:    Sodium 132 (*)    Chloride 95 (*)    Glucose, Bld 133 (*)    BUN 33 (*)    Creatinine, Ser 2.11 (*)    Albumin 3.2 (*)    ALT 13 (*)    Alkaline Phosphatase 242 (*)    GFR calc non Af Amer 27 (*)    GFR calc Af Amer 31 (*)    All other components within normal limits  LIPASE, BLOOD  URINALYSIS COMPLETEWITH MICROSCOPIC ([facility] ONLY)  Creatinine level was bumped slightly since his surgery   Radiology: *  "Ct Head Wo Contrast  Result Date: 02/22/2016 CLINICAL DATA:  Status post fall last night. EXAM: CT HEAD WITHOUT CONTRAST CT CERVICAL SPINE WITHOUT CONTRAST TECHNIQUE: Multidetector CT imaging of the head and cervical spine was performed following the standard protocol without intravenous contrast. Multiplanar CT image reconstructions of the cervical spine were also generated. COMPARISON:  06/28/2015  FINDINGS: CT HEAD FINDINGS Brain: No evidence of acute infarction, hemorrhage, hydrocephalus, extra-axial collection or mass lesion/mass effect. Periventricular white matter low attenuation likely secondary to microvascular disease. Mild generalized cerebral atrophy. Vascular: No hyperdense vessel or unexpected calcification. Skull: No osseous abnormality. Sinuses/Orbits: Visualized paranasal sinuses are clear. Visualized mastoid sinuses are clear. Visualized orbits demonstrate no focal abnormality. Other: None CT CERVICAL SPINE FINDINGS Alignment: Normal. Skull base and vertebrae: No acute fracture. No primary bone lesion or focal pathologic process. Soft tissues and spinal canal: No prevertebral fluid or swelling. No visible canal hematoma. Disc levels: Degenerative disc disease with disc height loss at C4-5, C5-6 and C6-7. Moderate left facet arthropathy at  C2-3. Moderate bilateral facet arthropathy at C3-4 with left foraminal stenosis moderate bilateral facet arthropathy at C4-5 with a broad-based disc osteophyte complex. Broad-based disc osteophyte complex at C5-6 with left facet arthropathy and left foraminal stenosis. Upper chest: Lung apices are clear. Other: No fluid collection or hematoma. IMPRESSION: 1. No acute intracranial pathology. 2.  No acute osseous injury of the cervical spine. 3. Cervical spine spondylosis. Electronically Signed   By: Kathreen Devoid   On: 02/22/2016 12:38   Ct Chest W Contrast  Result Date: 03/02/2016 CLINICAL DATA:  Left renal cancer EXAM: CT CHEST WITH CONTRAST TECHNIQUE: Multidetector CT imaging of the chest was performed during intravenous contrast administration. CONTRAST:  31mL ISOVUE-300 IOPAMIDOL (ISOVUE-300) INJECTION 61% COMPARISON:  CT abdomen and pelvis scan 02/22/2016 FINDINGS: Cardiovascular: Atherosclerotic calcifications of thoracic aorta and coronary arteries. No aortic aneurysm. Central pulmonary artery is unremarkable. Heart size within normal limits. No  pericardial effusion. Mediastinum/Nodes: No mediastinal hematoma or adenopathy. Small hiatal hernia. Lungs/Pleura: Images of the lung parenchyma shows no infiltrate or pulmonary edema. No focal consolidation. No pulmonary nodules. No evidence of metastatic disease. Upper Abdomen: The visualized upper abdomen shows multiple punctate calcifications within liver and spleen probable prior granulomatous disease. There is indeterminate low-density lesion in right hepatic lobe measures 7 mm. Again noted a cyst in left kidney measures 4 cm. Partially visualized solid mass in midpole of the left kidney. Musculoskeletal: No destructive bony lesions are noted. Sagittal images of the spine shows degenerative changes thoracic spine. Sagittal view of the sternum is unremarkable. IMPRESSION: 1. No evidence of metastatic disease within chest. No infiltrate or pulmonary edema. 2. No mediastinal hematoma or adenopathy. Atherosclerotic calcifications of thoracic aorta and coronary arteries. 3. Degenerative changes thoracic spine. 4. Indeterminate low-density lesion within right hepatic lobe measures 7 mm. Further evaluation with enhanced MRI is suggested. 5. Small hiatal hernia. Electronically Signed   By: Lahoma Crocker M.D.   On: 03/02/2016 14:47   Ct Cervical Spine Wo Contrast  Result Date: 02/22/2016 CLINICAL DATA:  Status post fall last night. EXAM: CT HEAD WITHOUT CONTRAST CT CERVICAL SPINE WITHOUT CONTRAST TECHNIQUE: Multidetector CT imaging of the head and cervical spine was performed following the standard protocol without intravenous contrast. Multiplanar CT image reconstructions of the cervical spine were also generated. COMPARISON:  06/28/2015 FINDINGS: CT HEAD FINDINGS Brain: No evidence of acute infarction, hemorrhage, hydrocephalus, extra-axial collection or mass lesion/mass effect. Periventricular white matter low attenuation likely secondary to microvascular disease. Mild generalized cerebral atrophy. Vascular: No  hyperdense vessel or unexpected calcification. Skull: No osseous abnormality. Sinuses/Orbits: Visualized paranasal sinuses are clear. Visualized mastoid sinuses are clear. Visualized orbits demonstrate no focal abnormality. Other: None CT CERVICAL SPINE FINDINGS Alignment: Normal. Skull base and vertebrae: No acute fracture. No primary bone lesion or focal pathologic process. Soft tissues and spinal canal: No prevertebral fluid or swelling. No visible canal hematoma. Disc levels: Degenerative disc disease with disc height loss at C4-5, C5-6 and C6-7. Moderate left facet arthropathy at C2-3. Moderate bilateral facet arthropathy at C3-4 with left foraminal stenosis moderate bilateral facet arthropathy at C4-5 with a broad-based disc osteophyte complex. Broad-based disc osteophyte complex at C5-6 with left facet arthropathy and left foraminal stenosis. Upper chest: Lung apices are clear. Other: No fluid collection or hematoma. IMPRESSION: 1. No acute intracranial pathology. 2.  No acute osseous injury of the cervical spine. 3. Cervical spine spondylosis. Electronically Signed   By: Kathreen Devoid   On: 02/22/2016 12:38   Dg Chest Portable  1 View  Result Date: 02/22/2016 CLINICAL DATA:  Pt fell last night AND crawled to get help. No complaints of chest pain just weakness, pain in right shoulder from injection x 1 week ago. Pt hx of hypertension. EXAM: PORTABLE CHEST 1 VIEW COMPARISON:  None. FINDINGS: Normal mediastinum and cardiac silhouette. Normal pulmonary vasculature. No evidence of effusion, infiltrate, or pneumothorax. No acute bony abnormality. IMPRESSION: No acute cardiopulmonary process. Electronically Signed   By: Suzy Bouchard M.D.   On: 02/22/2016 12:24   Ct Renal Stone Study  Result Date: 03/12/2016 CLINICAL DATA:  Abdominal pain and hypotension. History renal cell carcinoma. Status post left nephrectomy 03/08/2016 EXAM: CT ABDOMEN AND PELVIS WITHOUT CONTRAST TECHNIQUE: Multidetector CT imaging  of the abdomen and pelvis was performed following the standard protocol without IV contrast. COMPARISON:  CT abdomen pelvis 02/22/2016 FINDINGS: Lower chest: Small left pleural effusion and associated atelectasis. Trace right pleural effusion. Hepatobiliary: Scattered hepatic calcified granulomata. The gallbladder is distended. Pancreas: Normal pancreatic contours. No peripancreatic fluid collection or pancreatic ductal dilatation. Spleen: The spleen is shrunken with numerous calcified granulomata. Adrenals/Urinary Tract: The left adrenal is enlarged with a post hemorrhagic appearance. The patient is status post left nephrectomy. Small amount of fluid and inflammatory change in the left renal fossa. No right hydronephrosis. Small calcification within the midportion of the right kidney. Right lower pole renal cyst measures up to 3.6 cm. Stomach/Bowel: There is inflammatory stranding surrounding the transverse colon at the splenic flexure. No evidence of small-bowel obstruction. No discrete intra-abdominal fluid collection. There is rectosigmoid diverticulosis without clear evidence of acute diverticulitis. Normal appendix. Vascular/Lymphatic: There is atherosclerotic calcification of the abdominal aorta. Distal abdominal aortic aneurysm is unchanged in size measuring approximately 3.4 cm. No abdominal or pelvic adenopathy. Reproductive: Multiple brachytherapy seeds within the prostate. Seminal vesicles are normal. Musculoskeletal: Severe multilevel lumbar facet arthrosis and osteophytosis. No bony spinal canal stenosis. No focal lytic lesions. Multifocal subcutaneous emphysema. Other: There is large volume pneumoperitoneum within the anterior abdomen, much greater than expected, even in the context of recent surgery. IMPRESSION: 1. Large volume pneumoperitoneum within the anterior abdomen, much greater than expected following surgery. The appearance is concerning for bowel perforation. Fifth 2. Wall thickening and  inflammatory stranding surrounding the distal transverse colon and proximal descending colon near the splenic flexure. 3. Small left adrenal hematoma. 4. Aortic atherosclerosis with infrarenal abdominal aortic aneurysm measuring up to 3.4 cm. 5. Status post left nephrectomy. Multifocal retroperitoneal loculated gas may be postoperative. Critical Value/emergent results were called by telephone at the time of interpretation on 03/12/2016 at 11:51 pm to Dr. Meade Maw , who verbally acknowledged these results. Electronically Signed   By: Ulyses Jarred M.D.   On: 03/12/2016 23:51   Ct Renal Stone Study  Result Date: 02/22/2016 CLINICAL DATA:  Pain following fall EXAM: CT ABDOMEN AND PELVIS WITHOUT CONTRAST TECHNIQUE: Multidetector CT imaging of the abdomen and pelvis was performed following the standard protocol without oral or intravenous contrast material administration. COMPARISON:  None. FINDINGS: Lower chest: There is mild bibasilar lung scarring. No edema or consolidation is noted in the lung bases. There are foci of coronary artery calcification. Hepatobiliary: There are calcified granulomas in the spleen. There is no perihepatic fluid. There is no obvious liver laceration or rupture on this noncontrast enhanced study. Gallbladder appears mildly distended without wall thickening. There is no appreciable biliary duct dilatation. Pancreas: No pancreatic mass or inflammatory focus. No peripancreatic fluid. Spleen: There are splenic granulomas, calcified. No perisplenic  fluid. Spleen appears intact on this noncontrast enhanced study. Adrenals/Urinary Tract: Adrenals appear normal bilaterally. There is a complex solid mass arising from it occupying much of the left kidney measuring 11.4 x 8.8 x 8.6 cm. This mass invades the collecting system but does not clearly invade the left renal vein on this noncontrast enhanced study. There is thickening in the perinephric fascia, but there does not appear to be mass  extending beyond the perinephric fascia. There is also a cyst arising from the upper pole of the left kidney measuring 5.0 x 4.4 cm. There is no hydronephrosis on either side. There is no perinephric fluid on either side. There are small calcifications in the mass in the left kidney. In the right kidney, there are several 1-2 mm calculi. There is no ureteral calculus on either side. Urinary bladder is midline with wall thickness within normal limits. Stomach/Bowel: There are multiple sigmoid diverticula without diverticulitis. There are scattered diverticular elsewhere in the colon, primarily in the right colon. There is no bowel wall or mesenteric thickening. No bowel obstruction. No free air or portal venous air. Vascular/Lymphatic: Aorta is ectatic with atherosclerotic calcification throughout the aorta. There is dilatation of the distal aorta just proximal to the bifurcation with a maximum transverse diameter of 3.7 x 3.1 cm. Calcification is also noted in both common iliac, external iliac, and hypogastric arteries. The major mesenteric vessels appear patent on this noncontrast enhanced study. No adenopathy is evident in the abdomen or pelvis. Reproductive: There are seed implants throughout the prostate consistent with prostate carcinoma brachytherapy. There is no pelvic mass or pelvic fluid collection. Other: No intraperitoneal or retroperitoneal fluid collections are identified. Appendix appears normal. No abscess or ascites evident in the abdomen or pelvis. There is a small right inguinal hernia which contains a loop of small bowel but no bowel compromise. Musculoskeletal: There is extensive degenerative type change in the thoracic spine. There is no evident acute fracture. No blastic or lytic bone lesions are apparent. No intramuscular or abdominal wall lesions evident. IMPRESSION: **An incidental finding of potential clinical significance has been found. Mass arising from the left kidney consistent with  renal cell carcinoma. This mass invades the left renal collecting system but does not appear to invade the left renal vein on this noncontrast enhanced study. This mass measures 11.4 x 8.8 x 8.6 cm.** There is extensive colonic diverticulosis without diverticulitis. No bowel obstruction. No abscess. **An incidental finding of potential clinical significance has been found. Distal abdominal aortic aneurysm with transverse diameter of 3.7 x 3.1 cm. Recommend followup by ultrasound in 2 years. This recommendation follows ACR consensus guidelines: White Paper of the ACR Incidental Findings Committee II on Vascular Findings. J Am Coll Radiol 2013; 10:789-794.** There is a small right inguinal hernia which contains small bowel but no bowel obstruction. Seed implants in prostate. Evidence of prior granulomatous disease with granulomas in the liver and spleen. Multiple foci of coronary artery calcification noted. No traumatic appearing lesion is evident on this noncontrast enhanced study. Electronically Signed   By: Lowella Grip III M.D.   On: 02/22/2016 14:26  "  I personally reviewed the radiologic studies    ED Course:  Given the review of his most recent CAT scan of the abdomen appears that he has some free air that may be more extensive than just simply from laparoscopic surgery. Patient's case was reviewed with general surgery and briefly discussed with the urologist on call. Patient's otherwise hemodynamically stable   Clinical  Course      Assessment:  Possible bowel perforation   Final Clinical Impression:  Final diagnoses:  Abdominal pain  Bowel perforation [facility])     Plan:  Surgical consultation            Daymon Larsen, MD 03/13/16 [phone]

## 2016-03-13 NOTE — Consult Note (Signed)
PULMONARY / CRITICAL CARE MEDICINE   Name: Frank Bartlett MRN: [id number] DOB: 03-Jun-1925    ADMISSION DATE:  03/12/2016 CONSULTATION DATE:  03/13/2016  REFERRING MD:  Dr. Hampton Abbot  CHIEF COMPLAINT: Abdominal Pain  HISTORY OF PRESENT ILLNESS:   This is an 82 yo male with a PMH of Prostate Cancer (treated with Brachytherapy in 2001), Osteoarthritis, Hypothyroidism, HTN, Hyperlipidemia, Bradycardia, and B12 Deficiency.  He presented to [facility] ER via EMS on 11/7 with c/o abdominal pain, hypotension SBP 70's, and feeling cold and clammy.  He had a recent left laparoscopic nephrectomy 11/2 secondary to an incidental finding of a left renal mass on 10/18 he was discharged to [facility] on 11/4 for rehabilitation post op. Since discharge per Urology notes he has had bouts of constipation requiring laxatives and one small episode of emesis.  In the ER it was noted the pt had new onset atrial fibrillation/atrial flutter.  A CT Abdomen and Pelvis on 11/6 revealed a volume pneumoperitoneum with some inflammatory changes of the transverse and descending colon greater than expected following recent surgery, therefore surgery and urology were consulted.  He was subsequently transported to the OR 11/7 for an exploratory laparotomy.  Per surgery notes there was no evidence of injury or perforation of the transverse and descending colon, however there were areas of inflammation; there was no ischemia; the gallbladder was distended but not inflamed; and the small bowel had some proximal diverticula but no diverticulitis.  There was no source for the pts large pneumoperitoneum.  PCCM consulted for management of hypotension and new onset atrial fibrillation/flutter post surgery.   PAST MEDICAL HISTORY :  He  has a past medical history of B12 deficiency; Bradycardia; Hyperlipidemia; Hypertension; Hypothyroidism; Osteoarthritis; and Prostate cancer ([place]) (2001).  PAST SURGICAL HISTORY: He  has a past surgical history  that includes Cataract extraction, bilateral; Excisional hemorrhoidectomy; Insertion prostate radiation seed; Colonoscopy; and Laparoscopic nephrectomy, hand assisted (Left, 03/08/2016).  No Known Allergies  No current facility-administered medications on file prior to encounter.    Current Outpatient Prescriptions on File Prior to Encounter  Medication Sig  . aspirin EC 81 MG tablet Take 81 mg by mouth daily.   . Cyanocobalamin (RA VITAMIN B-12 TR) 1000 MCG TBCR Take 1,000 mcg by mouth daily.   . feeding supplement, ENSURE ENLIVE, (ENSURE ENLIVE) LIQD Take 237 mLs by mouth 2 (two) times daily with a meal. (Patient taking differently: Take 237 mLs by mouth 2 (two) times daily as needed. )  . HYDROcodone-acetaminophen (NORCO/VICODIN) 5-325 MG tablet Take 1-2 tablets by mouth every 4 (four) hours as needed for moderate pain.  Marland Kitchen levothyroxine (SYNTHROID, LEVOTHROID) 112 MCG tablet Take 112 mcg by mouth daily before breakfast.  . lisinopril-hydrochlorothiazide (PRINZIDE,ZESTORETIC) 20-12.5 MG tablet take 1 tablet by mouth once daily  . meloxicam (MOBIC) 15 MG tablet Take 7.5 mg by mouth.   . Multiple Vitamin (MULTI-VITAMINS) TABS Take by mouth.  . senna-docusate (SENOKOT-S) 8.6-50 MG tablet Take 1 tablet by mouth 2 (two) times daily. While taking strong pain medication to prevent constipation    FAMILY HISTORY:  His indicated that the status of his mother is unknown. He indicated that the status of his neg hx is unknown.    SOCIAL HISTORY: He  reports that he has never smoked. He has never used smokeless tobacco. He reports that he does not drink alcohol or use drugs.  REVIEW OF SYSTEMS:  Positives in BOLD Gen: Denies fever, chills, weight change, fatigue, night sweats HEENT:  Denies blurred vision, double vision, hearing loss, tinnitus, sinus congestion, rhinorrhea, sore throat, neck stiffness, dysphagia PULM: Denies shortness of breath, cough, sputum production, hemoptysis, wheezing CV:  Denies chest pain, edema, orthopnea, paroxysmal nocturnal dyspnea, palpitations GI: abdominal pain, nausea, vomiting, diarrhea, hematochezia, melena, constipation, change in bowel habits GU: Denies dysuria, hematuria, polyuria, oliguria, urethral discharge Endocrine: Denies hot or cold intolerance, polyuria, polyphagia or appetite change Derm: Denies rash, dry skin, scaling or peeling skin change Heme: Denies easy bruising, bleeding, bleeding gums Neuro: Denies headache, numbness, weakness, slurred speech, loss of memory or consciousness   SUBJECTIVE:  Pt c/o abdominal pain post op no other complaints at this time  VITAL SIGNS: BP 108/86   Pulse (!) 111   Temp 97.6 F (36.4 C)   Resp 16   Ht 5\' 9"  (1.753 m)   Wt 160 lb (72.6 kg)   SpO2 99%   BMI 23.63 kg/m   HEMODYNAMICS:    VENTILATOR SETTINGS:    INTAKE / OUTPUT: No intake/output data recorded.  PHYSICAL EXAMINATION: General:  Acutely ill Caucasian male, well developed Neuro:  Alert and oriented, follows commands, PERRLA HEENT:  Supple, no JVD Cardiovascular:  Irregular, irregular, no M/R/G Lungs:  Course bilateral bases, diminished all other lobes, even, non labored Abdomen:  No audible bowel sounds, tender, non distended, soft Musculoskeletal:  Normal bulk and tone Skin:  Midline abdominal incision dressing dry and intact  LABS:  BMET  Recent Labs Lab 03/09/16 0450 03/10/16 0522 03/12/16 2216  NA 136 134* 132*  K 4.3 4.1 4.0  CL 104 102 95*  CO2 25 28 23   BUN 19 17 33*  CREATININE 1.44* 1.33* 2.11*  GLUCOSE 143* 97 133*    Electrolytes  Recent Labs Lab 03/09/16 0450 03/10/16 0522 03/12/16 2216  CALCIUM 8.2* 8.2* 9.3    CBC  Recent Labs Lab 03/09/16 0450 03/10/16 0522 03/12/16 2216  WBC 10.4 8.2 10.9*  HGB 11.6* 10.4* 13.5  HCT 33.7* 29.2* 38.5*  PLT 474* 345 445*    Coag's No results for input(s): APTT, INR in the last 168 hours.  Sepsis Markers No results for input(s):  LATICACIDVEN, PROCALCITON, O2SATVEN in the last 168 hours.  ABG No results for input(s): PHART, PCO2ART, PO2ART in the last 168 hours.  Liver Enzymes  Recent Labs Lab 03/12/16 2216  AST 40  ALT 13*  ALKPHOS 242*  BILITOT 0.7  ALBUMIN 3.2*    Cardiac Enzymes No results for input(s): TROPONINI, PROBNP in the last 168 hours.  Glucose No results for input(s): GLUCAP in the last 168 hours.  Imaging Ct Renal Stone Study  Result Date: 03/12/2016 CLINICAL DATA:  Abdominal pain and hypotension. History renal cell carcinoma. Status post left nephrectomy 03/08/2016 EXAM: CT ABDOMEN AND PELVIS WITHOUT CONTRAST TECHNIQUE: Multidetector CT imaging of the abdomen and pelvis was performed following the standard protocol without IV contrast. COMPARISON:  CT abdomen pelvis 02/22/2016 FINDINGS: Lower chest: Small left pleural effusion and associated atelectasis. Trace right pleural effusion. Hepatobiliary: Scattered hepatic calcified granulomata. The gallbladder is distended. Pancreas: Normal pancreatic contours. No peripancreatic fluid collection or pancreatic ductal dilatation. Spleen: The spleen is shrunken with numerous calcified granulomata. Adrenals/Urinary Tract: The left adrenal is enlarged with a post hemorrhagic appearance. The patient is status post left nephrectomy. Small amount of fluid and inflammatory change in the left renal fossa. No right hydronephrosis. Small calcification within the midportion of the right kidney. Right lower pole renal cyst measures up to 3.6 cm. Stomach/Bowel: There is inflammatory  stranding surrounding the transverse colon at the splenic flexure. No evidence of small-bowel obstruction. No discrete intra-abdominal fluid collection. There is rectosigmoid diverticulosis without clear evidence of acute diverticulitis. Normal appendix. Vascular/Lymphatic: There is atherosclerotic calcification of the abdominal aorta. Distal abdominal aortic aneurysm is unchanged in size  measuring approximately 3.4 cm. No abdominal or pelvic adenopathy. Reproductive: Multiple brachytherapy seeds within the prostate. Seminal vesicles are normal. Musculoskeletal: Severe multilevel lumbar facet arthrosis and osteophytosis. No bony spinal canal stenosis. No focal lytic lesions. Multifocal subcutaneous emphysema. Other: There is large volume pneumoperitoneum within the anterior abdomen, much greater than expected, even in the context of recent surgery. IMPRESSION: 1. Large volume pneumoperitoneum within the anterior abdomen, much greater than expected following surgery. The appearance is concerning for bowel perforation. Fifth 2. Wall thickening and inflammatory stranding surrounding the distal transverse colon and proximal descending colon near the splenic flexure. 3. Small left adrenal hematoma. 4. Aortic atherosclerosis with infrarenal abdominal aortic aneurysm measuring up to 3.4 cm. 5. Status post left nephrectomy. Multifocal retroperitoneal loculated gas may be postoperative. Critical Value/emergent results were called by telephone at the time of interpretation on 03/12/2016 at 11:51 pm to Dr. Meade Maw , who verbally acknowledged these results. Electronically Signed   By: Ulyses Jarred M.D.   On: 03/12/2016 23:51   STUDIES:  CT Renal Stone 11/6>>Large volume pneumoperitoneum within the anterior abdomen, much greater than expected following surgery. The appearance is concerning for bowel perforation. Fifth Wall thickening and inflammatory stranding surrounding the distal transverse colon and proximal descending colon near the splenic flexure. Small left adrenal hematoma. Aortic atherosclerosis with infrarenal abdominal aortic aneurysm measuring up to 3.4 cm. Status post left nephrectomy. Multifocal retroperitoneal loculated gas may be postoperative.  CULTURES: None  ANTIBIOTICS: Zosyn 11/7>>x1 dose preop  SIGNIFICANT EVENTS: 11/7-Pt taken to OR due to CT Renal Stone indicating large  volume pneumoperitoneum within the anterior abdomen  11/7-Pt admitted to ICU post op due to hypotension and new onset atrial fibrillation/flutter PCCM consulted for management  LINES/TUBES: PIV's x2>> Right radial arterial line 11/7>>  ASSESSMENT / PLAN:  PULMONARY A: No acute issues P:   Supplemental O2 as needed to maintain O2 sats >92% Pulmonary hygiene Intermittent CXR Prn ABG's  CARDIOVASCULAR A:  New onset atrial fibrillation/flutter post op Hypotension likely secondary to sedating medication Hx: Bradycardia, HTN, and Hyperlipidemia P:  Hold outpatient Lisinopril-Hydrochlorothiazide  Maintain map >65 NS @ 100 ml/hr Cardiology consulted appreciate input Trend troponin's Continuous telemetry monitoring  RENAL A:   Acute renal failure likely secondary to hypotension Recent left nephrectomy secondary to left renal mass11/2 P:   Trend BMP's Strict intake and output Foley Catheter Replace electrolytes as indicated  GASTROINTESTINAL A:   Post op exploratory laparotomy 11/7 Abdominal pain Constipation P:   Surgery consulted appreciate input Keep NPO will defer to Surgery  Prn bowel regimen   HEMATOLOGIC A:   Anemia Hx: Prostate Cancer and B12 Deficiency P:  SCD's for VTE prophylaxis Trend CBC's Monitor for s/sx of bleeding Transfuse of hgb <7  INFECTIOUS A:   No acute issues  P:   Trend WBC's and monitor fever curve Will obtain cultures if pt becomes febrile  ENDOCRINE A:   Hx: Hypothyroidism  P:   Continue synthroid Monitor serum glucose Hypo/Hyperglycemic protocol  NEUROLOGIC A:   Acute pain  Hx: Osteoarthritis P:   Prn morphine for pain management Promote family presence at bedside   FAMILY  - Updates:  No family at bedside to update 11/7  -  Inter-disciplinary family meet or Palliative Care meeting due by:  03/20/2016  Marda Stalker, [place] Pager [phone] (please enter 7 digits) PCCM Consult Pager  [phone] (please enter 7 digits)  STAFF NOTE: I, Dr. Vilinda Boehringer have personally reviewed patient's available data, including medical history, events of note, physical examination and test results as part of my evaluation. I have discussed with resident/NP NP Blakeney and other care providers such as pharmacist, RN and RRT.  In addition,  I personally evaluated patient and elicited key findings of   HPI:  82 year old male past medical history of prostate cancer, osteoporosis, hypothyroidism, recent robotic laparoscopic nephrectomy and 11/2 secondary to incidental finding of left renal mass, now with abdominal pain status post exploratory laparotomy with no significant findings, extubated, in the ICU for postop medical management. Patient was in rehabilitation complaining of acute abdominal pain, CT abdomen and pelvis showed free air in the abdomen along with pneumoperitoneum, surgery and urology were consulted, he is taking to the daughter last night for expiratory laparotomy, per surgical note still is no evidence of injury or perforation, and no etiology of with severe came from. He was closed back up and transferred to the ICU extubated for postop medical management. There is initial evaluation in the ER, he is also noted to be with new onset atrial fibrillation/flutter.  O:  GEN-awake, alert, no significant respiratory distress, mild abdominal pain, mild soar throat HEENT-PERRLA, [place]/AT, no lesions CVS-s1, s2, regular LUNGS-coarse upper airway sounds, dec basilar bs, no ronchi ABD-tender, dec BS, wound clean with no drainage MSK-no lesions   Recent Labs CBC Latest Ref Rng & Units 03/13/2016 03/12/2016 03/10/2016  WBC 3.8 - 10.6 K/uL 9.8 10.9(H) 8.2  Hemoglobin 13.0 - 18.0 g/dL 11.5(L) 13.5 10.4(L)  Hematocrit 40.0 - 52.0 % 33.4(L) 38.5(L) 29.2(L)  Platelets 150 - 440 K/uL 329 445(H) 345      Recent Labs BMP Latest Ref Rng & Units 03/13/2016 03/12/2016 03/10/2016  Glucose 65 - 99 mg/dL  155(H) 133(H) 97  BUN 6 - 20 mg/dL 34(H) 33(H) 17  Creatinine 0.61 - 1.24 mg/dL 2.09(H) 2.11(H) 1.33(H)  Sodium 135 - 145 mmol/L 132(L) 132(L) 134(L)  Potassium 3.5 - 5.1 mmol/L 3.8 4.0 4.1  Chloride 101 - 111 mmol/L 103 95(L) 102  CO2 22 - 32 mmol/L 21(L) 23 28  Calcium 8.9 - 10.3 mg/dL 7.6(L) 9.3 8.2(L)       (The following images and results were reviewed by Dr. Stevenson Clinch on 03/13/2016). Ct Renal Stone Study  Result Date: 03/12/2016 CLINICAL DATA:  Abdominal pain and hypotension. History renal cell carcinoma. Status post left nephrectomy 03/08/2016 EXAM: CT ABDOMEN AND PELVIS WITHOUT CONTRAST TECHNIQUE: Multidetector CT imaging of the abdomen and pelvis was performed following the standard protocol without IV contrast. COMPARISON:  CT abdomen pelvis 02/22/2016 FINDINGS: Lower chest: Small left pleural effusion and associated atelectasis. Trace right pleural effusion. Hepatobiliary: Scattered hepatic calcified granulomata. The gallbladder is distended. Pancreas: Normal pancreatic contours. No peripancreatic fluid collection or pancreatic ductal dilatation. Spleen: The spleen is shrunken with numerous calcified granulomata. Adrenals/Urinary Tract: The left adrenal is enlarged with a post hemorrhagic appearance. The patient is status post left nephrectomy. Small amount of fluid and inflammatory change in the left renal fossa. No right hydronephrosis. Small calcification within the midportion of the right kidney. Right lower pole renal cyst measures up to 3.6 cm. Stomach/Bowel: There is inflammatory stranding surrounding the transverse colon at the splenic flexure. No evidence of small-bowel obstruction. No discrete intra-abdominal  fluid collection. There is rectosigmoid diverticulosis without clear evidence of acute diverticulitis. Normal appendix. Vascular/Lymphatic: There is atherosclerotic calcification of the abdominal aorta. Distal abdominal aortic aneurysm is unchanged in size measuring  approximately 3.4 cm. No abdominal or pelvic adenopathy. Reproductive: Multiple brachytherapy seeds within the prostate. Seminal vesicles are normal. Musculoskeletal: Severe multilevel lumbar facet arthrosis and osteophytosis. No bony spinal canal stenosis. No focal lytic lesions. Multifocal subcutaneous emphysema. Other: There is large volume pneumoperitoneum within the anterior abdomen, much greater than expected, even in the context of recent surgery. IMPRESSION: 1. Large volume pneumoperitoneum within the anterior abdomen, much greater than expected following surgery. The appearance is concerning for bowel perforation. Fifth 2. Wall thickening and inflammatory stranding surrounding the distal transverse colon and proximal descending colon near the splenic flexure. 3. Small left adrenal hematoma. 4. Aortic atherosclerosis with infrarenal abdominal aortic aneurysm measuring up to 3.4 cm. 5. Status post left nephrectomy. Multifocal retroperitoneal loculated gas may be postoperative. Critical Value/emergent results were called by telephone at the time of interpretation on 03/12/2016 at 11:51 pm to Dr. Meade Maw , who verbally acknowledged these results. Electronically Signed   By: Ulyses Jarred M.D.   On: 03/12/2016 23:51      A: 89 male with PMHx of Prostate Cancer (Brachytherapy 2001), HLD, OA, HTN, recent left partial nephrectomy on 11/2 for renal mass, was in rehab, had abd pain/distension, w/u concerning for perforated colon, exlap negative for perf bowel, developed new onset afib, in ICU for medical management   New onset atrial fibrillation/flutter post op Hypotension likely secondary to sedating medication Hx: Bradycardia, HTN, and Hyperlipidemia Acute renal failure likely secondary to hypotension Recent left nephrectomy secondary to left renal mass11/2 Post op exploratory laparotomy 11/7 Abdominal pain Constipation  P:   - Postoperatively patient is now medically stable, he does have  new onset atrial fibrillation for which cardiology is following. -Continue with cardiology recommendations -incentive spirometry -Pain management -Keep nothing by mouth, defer feeding to surgery -hold antihypertensive meds for now.  Patient medically stable, change status to stepdown, transfer the hospitalist on 11/8 AM. Spoke with Dr. Vianne Bulls.   .  Rest per NP/medical resident whose note is outlined above and that I agree with  The patient is critically ill with multiple organ systems failure and requires high complexity decision making for assessment and support, frequent evaluation and titration of therapies, application of advanced monitoring technologies and extensive interpretation of multiple databases.   Critical Care Time devoted to patient care services described in this note is  45 Minutes.   This time reflects time of care of this signee Dr Vilinda Boehringer.  This critical care time does not reflect procedure time, or teaching time or supervisory time of PA/NP/Med-student/Med Resident etc but could involve care discussion time.  Vilinda Boehringer, MD  Pulmonary and Critical Care Pager [phone] (please enter 7-digits) On Call Pager [phone] (please enter 7-digits)  Note: This note was prepared with Dragon dictation along with smaller phrase technology. Any transcriptional errors that result from this process are unintentional.

## 2016-03-13 NOTE — Progress Notes (Signed)
MEDICATION RELATED CONSULT NOTE - INITIAL   Pharmacy Consult to adjust antibiotics based on renal function in this 69 yoM admitted s/p laparoscopic nephrectomy secondary to renal mass. Pt now has free air in abdomen and is being followed by surgery.  1. Antibiotics: pt is receiving piperacillin/tazobactam 3.375 g q8h IE. This is appropriate based on CrCl > 20 (4mL/min). Pharmacy will continue to monitor and adjust per consult.  No Known Allergies  Patient Measurements: Height: 5\' 9"  (175.3 cm) Weight: 170 lb 13.7 oz (77.5 kg) IBW/kg (Calculated) : 70.7  Vital Signs: Temp: 98.2 F (36.8 C) (11/07 0813) Temp Source: Axillary (11/07 0813) BP: 110/54 (11/07 1000) Pulse Rate: 54 (11/07 1000) Intake/Output from previous day: 11/06 0701 - 11/07 0700 In: 1700 [I.V.:1700] Out: 155 [Urine:105; Blood:50] Intake/Output from this shift: Total I/O In: 363.3 [I.V.:313.3; IV Piggyback:50] Out: -   Labs:  Recent Labs  03/12/16 2216 03/13/16 0526  WBC 10.9* 9.8  HGB 13.5 11.5*  HCT 38.5* 33.4*  PLT 445* 329  CREATININE 2.11* 2.09*  ALBUMIN 3.2*  --   PROT 7.5  --   AST 40  --   ALT 13*  --   ALKPHOS 242*  --   BILITOT 0.7  --    Estimated Creatinine Clearance: 24.9 mL/min (by C-G formula based on SCr of 2.09 mg/dL (H)).   Microbiology: Recent Results (from the past 720 hour(s))  Urine culture     Status: Abnormal   Collection Time: 02/22/16 11:47 AM  Result Value Ref Range Status   Specimen Description URINE, RANDOM  Final   Special Requests NONE  Final   Culture (A)  Final    <10,000 COLONIES/mL INSIGNIFICANT GROWTH Performed at [facility]    Report Status 02/23/2016 FINAL  Final  Urine culture     Status: None   Collection Time: 02/29/16 11:57 AM  Result Value Ref Range Status   Specimen Description URINE, RANDOM  Final   Special Requests NONE  Final   Culture NO GROWTH Performed at [facility]   Final   Report Status 03/01/2016 FINAL  Final   MRSA PCR Screening     Status: None   Collection Time: 03/13/16  6:45 AM  Result Value Ref Range Status   MRSA by PCR NEGATIVE NEGATIVE Final    Comment:        The GeneXpert MRSA Assay (FDA approved for NASAL specimens only), is one component of a comprehensive MRSA colonization surveillance program. It is not intended to diagnose MRSA infection nor to guide or monitor treatment for MRSA infections.     Medical History: Past Medical History:  Diagnosis Date  . B12 deficiency   . Bradycardia   . Hyperlipidemia   . Hypertension   . Hypothyroidism   . Osteoarthritis   . Prostate cancer ([place]) 2001   Rad tx's + seed implants    Darrow Bussing, PharmD Pharmacy Resident 03/13/2016 11:48 AM

## 2016-03-13 NOTE — Anesthesia Procedure Notes (Signed)
Procedure Name: Intubation Date/Time: 03/13/2016 2:43 AM Performed by: Andria Frames Pre-anesthesia Checklist: Patient identified, Patient being monitored, Timeout performed, Emergency Drugs available and Suction available Patient Re-evaluated:Patient Re-evaluated prior to inductionOxygen Delivery Method: Circle system utilized Preoxygenation: Pre-oxygenation with 100% oxygen Intubation Type: IV induction Ventilation: Mask ventilation without difficulty Laryngoscope Size: Mac and 3 Grade View: Grade I Tube type: Oral Tube size: 7.5 mm Number of attempts: 1 Airway Equipment and Method: Stylet Placement Confirmation: ETT inserted through vocal cords under direct vision,  positive ETCO2 and breath sounds checked- equal and bilateral Secured at: 21 cm Tube secured with: Tape Dental Injury: Teeth and Oropharynx as per pre-operative assessment

## 2016-03-13 NOTE — Progress Notes (Signed)
Patient c/o pain 8/10, morphine prn given for pain, will continue to monitor.

## 2016-03-13 NOTE — Consult Note (Signed)
[facility] Cardiology  CARDIOLOGY CONSULT NOTE  Patient ID: Frank Bartlett MRN: [id number] DOB/AGE: 1925-08-30 82 y.o.  Admit date: 03/12/2016 Referring Physician Pilar Jarvis Primary Physician [facility] Primary Cardiologist Yarielis Funaro Reason for Consultation Atrial flutter  HPI: 82 year old gentleman referred for evaluation of atrial flutter. The patient is status post left nephrectomy for renal mass on 03/08/2016. The patient developed pneumoperitoneum, underwent exploratory laparotomy on 03/13/2016. There was no evidence for bowel perforation. Patient had an episode of atrial flutter, in the setting of hypotension, currently in sinus rhythm with PACs. Patient currently denies chest pain or shortness of breath.  Review of systems complete and found to be negative unless listed above     Past Medical History:  Diagnosis Date  . B12 deficiency   . Bradycardia   . Hyperlipidemia   . Hypertension   . Hypothyroidism   . Osteoarthritis   . Prostate cancer ([place]) 2001   Rad tx's + seed implants    Past Surgical History:  Procedure Laterality Date  . CATARACT EXTRACTION, BILATERAL    . COLONOSCOPY    . EXCISIONAL HEMORRHOIDECTOMY    . INSERTION PROSTATE RADIATION SEED    . LAPAROSCOPIC NEPHRECTOMY, HAND ASSISTED Left 03/08/2016   Procedure: HAND ASSISTED LAPAROSCOPIC NEPHRECTOMY;  Surgeon: Nickie Retort, MD;  Location: [facility] ORS;  Service: Urology;  Laterality: Left;    Prescriptions Prior to Admission  Medication Sig Dispense Refill Last Dose  . aspirin EC 81 MG tablet Take 81 mg by mouth daily.    03/12/2016 at Unknown time  . Cyanocobalamin (RA VITAMIN B-12 TR) 1000 MCG TBCR Take 1,000 mcg by mouth daily.    03/12/2016 at Unknown time  . feeding supplement, ENSURE ENLIVE, (ENSURE ENLIVE) LIQD Take 237 mLs by mouth 2 (two) times daily with a meal. (Patient taking differently: Take 237 mLs by mouth 2 (two) times daily as needed. ) 237 mL 12 03/12/2016 at Unknown time  .  HYDROcodone-acetaminophen (NORCO/VICODIN) 5-325 MG tablet Take 1-2 tablets by mouth every 4 (four) hours as needed for moderate pain. 30 tablet 0 03/12/2016 at Unknown time  . levothyroxine (SYNTHROID, LEVOTHROID) 112 MCG tablet Take 112 mcg by mouth daily before breakfast.   03/12/2016 at Unknown time  . lisinopril-hydrochlorothiazide (PRINZIDE,ZESTORETIC) 20-12.5 MG tablet take 1 tablet by mouth once daily   03/12/2016 at Unknown time  . meloxicam (MOBIC) 15 MG tablet Take 7.5 mg by mouth.    03/12/2016 at Unknown time  . Multiple Vitamin (MULTI-VITAMINS) TABS Take by mouth.   03/12/2016 at Unknown time  . senna-docusate (SENOKOT-S) 8.6-50 MG tablet Take 1 tablet by mouth 2 (two) times daily. While taking strong pain medication to prevent constipation 30 tablet 0 03/12/2016 at Unknown time   Social History   Social History  . Marital status: Married    Spouse name: N/A  . Number of children: N/A  . Years of education: N/A   Occupational History  . Not on file.   Social History Main Topics  . Smoking status: Never Smoker  . Smokeless tobacco: Never Used  . Alcohol use No     Comment: occasionally  . Drug use: No  . Sexual activity: Not on file   Other Topics Concern  . Not on file   Social History Narrative   Lives at [facility] independent living.    Family History  Problem Relation Age of Onset  . Hypertension Mother   . Breast cancer Mother   . Prostate cancer Neg Hx  Review of systems complete and found to be negative unless listed above      PHYSICAL EXAM  General: Well developed, well nourished, in no acute distress HEENT:  Normocephalic and atramatic Neck:  No JVD.  Lungs: Clear bilaterally to auscultation and percussion. Heart: HRRR . Normal S1 and S2 without gallops or murmurs.  Abdomen: Bowel sounds are positive, abdomen soft and non-tender  Msk:  Back normal, normal gait. Normal strength and tone for age. Extremities: No clubbing, cyanosis or edema.    Neuro: Alert and oriented X 3. Psych:  Good affect, responds appropriately  Labs:   Lab Results  Component Value Date   WBC 9.8 03/13/2016   HGB 11.5 (L) 03/13/2016   HCT 33.4 (L) 03/13/2016   MCV 91.6 03/13/2016   PLT 329 03/13/2016    Recent Labs Lab 03/12/16 2216 03/13/16 0526  NA 132* 132*  K 4.0 3.8  CL 95* 103  CO2 23 21*  BUN 33* 34*  CREATININE 2.11* 2.09*  CALCIUM 9.3 7.6*  PROT 7.5  --   BILITOT 0.7  --   ALKPHOS 242*  --   ALT 13*  --   AST 40  --   GLUCOSE 133* 155*   No results found for: CKTOTAL, CKMB, CKMBINDEX, TROPONINI No results found for: CHOL No results found for: HDL No results found for: LDLCALC No results found for: TRIG No results found for: CHOLHDL No results found for: LDLDIRECT    Radiology: Ct Head Wo Contrast  Result Date: 02/22/2016 CLINICAL DATA:  Status post fall last night. EXAM: CT HEAD WITHOUT CONTRAST CT CERVICAL SPINE WITHOUT CONTRAST TECHNIQUE: Multidetector CT imaging of the head and cervical spine was performed following the standard protocol without intravenous contrast. Multiplanar CT image reconstructions of the cervical spine were also generated. COMPARISON:  06/28/2015 FINDINGS: CT HEAD FINDINGS Brain: No evidence of acute infarction, hemorrhage, hydrocephalus, extra-axial collection or mass lesion/mass effect. Periventricular white matter low attenuation likely secondary to microvascular disease. Mild generalized cerebral atrophy. Vascular: No hyperdense vessel or unexpected calcification. Skull: No osseous abnormality. Sinuses/Orbits: Visualized paranasal sinuses are clear. Visualized mastoid sinuses are clear. Visualized orbits demonstrate no focal abnormality. Other: None CT CERVICAL SPINE FINDINGS Alignment: Normal. Skull base and vertebrae: No acute fracture. No primary bone lesion or focal pathologic process. Soft tissues and spinal canal: No prevertebral fluid or swelling. No visible canal hematoma. Disc levels:  Degenerative disc disease with disc height loss at C4-5, C5-6 and C6-7. Moderate left facet arthropathy at C2-3. Moderate bilateral facet arthropathy at C3-4 with left foraminal stenosis moderate bilateral facet arthropathy at C4-5 with a broad-based disc osteophyte complex. Broad-based disc osteophyte complex at C5-6 with left facet arthropathy and left foraminal stenosis. Upper chest: Lung apices are clear. Other: No fluid collection or hematoma. IMPRESSION: 1. No acute intracranial pathology. 2.  No acute osseous injury of the cervical spine. 3. Cervical spine spondylosis. Electronically Signed   By: Kathreen Devoid   On: 02/22/2016 12:38   Ct Chest W Contrast  Result Date: 03/02/2016 CLINICAL DATA:  Left renal cancer EXAM: CT CHEST WITH CONTRAST TECHNIQUE: Multidetector CT imaging of the chest was performed during intravenous contrast administration. CONTRAST:  9mL ISOVUE-300 IOPAMIDOL (ISOVUE-300) INJECTION 61% COMPARISON:  CT abdomen and pelvis scan 02/22/2016 FINDINGS: Cardiovascular: Atherosclerotic calcifications of thoracic aorta and coronary arteries. No aortic aneurysm. Central pulmonary artery is unremarkable. Heart size within normal limits. No pericardial effusion. Mediastinum/Nodes: No mediastinal hematoma or adenopathy. Small hiatal hernia. Lungs/Pleura: Images of  the lung parenchyma shows no infiltrate or pulmonary edema. No focal consolidation. No pulmonary nodules. No evidence of metastatic disease. Upper Abdomen: The visualized upper abdomen shows multiple punctate calcifications within liver and spleen probable prior granulomatous disease. There is indeterminate low-density lesion in right hepatic lobe measures 7 mm. Again noted a cyst in left kidney measures 4 cm. Partially visualized solid mass in midpole of the left kidney. Musculoskeletal: No destructive bony lesions are noted. Sagittal images of the spine shows degenerative changes thoracic spine. Sagittal view of the sternum is  unremarkable. IMPRESSION: 1. No evidence of metastatic disease within chest. No infiltrate or pulmonary edema. 2. No mediastinal hematoma or adenopathy. Atherosclerotic calcifications of thoracic aorta and coronary arteries. 3. Degenerative changes thoracic spine. 4. Indeterminate low-density lesion within right hepatic lobe measures 7 mm. Further evaluation with enhanced MRI is suggested. 5. Small hiatal hernia. Electronically Signed   By: Lahoma Crocker M.D.   On: 03/02/2016 14:47   Ct Cervical Spine Wo Contrast  Result Date: 02/22/2016 CLINICAL DATA:  Status post fall last night. EXAM: CT HEAD WITHOUT CONTRAST CT CERVICAL SPINE WITHOUT CONTRAST TECHNIQUE: Multidetector CT imaging of the head and cervical spine was performed following the standard protocol without intravenous contrast. Multiplanar CT image reconstructions of the cervical spine were also generated. COMPARISON:  06/28/2015 FINDINGS: CT HEAD FINDINGS Brain: No evidence of acute infarction, hemorrhage, hydrocephalus, extra-axial collection or mass lesion/mass effect. Periventricular white matter low attenuation likely secondary to microvascular disease. Mild generalized cerebral atrophy. Vascular: No hyperdense vessel or unexpected calcification. Skull: No osseous abnormality. Sinuses/Orbits: Visualized paranasal sinuses are clear. Visualized mastoid sinuses are clear. Visualized orbits demonstrate no focal abnormality. Other: None CT CERVICAL SPINE FINDINGS Alignment: Normal. Skull base and vertebrae: No acute fracture. No primary bone lesion or focal pathologic process. Soft tissues and spinal canal: No prevertebral fluid or swelling. No visible canal hematoma. Disc levels: Degenerative disc disease with disc height loss at C4-5, C5-6 and C6-7. Moderate left facet arthropathy at C2-3. Moderate bilateral facet arthropathy at C3-4 with left foraminal stenosis moderate bilateral facet arthropathy at C4-5 with a broad-based disc osteophyte complex.  Broad-based disc osteophyte complex at C5-6 with left facet arthropathy and left foraminal stenosis. Upper chest: Lung apices are clear. Other: No fluid collection or hematoma. IMPRESSION: 1. No acute intracranial pathology. 2.  No acute osseous injury of the cervical spine. 3. Cervical spine spondylosis. Electronically Signed   By: Kathreen Devoid   On: 02/22/2016 12:38   Dg Chest Portable 1 View  Result Date: 02/22/2016 CLINICAL DATA:  Pt fell last night AND crawled to get help. No complaints of chest pain just weakness, pain in right shoulder from injection x 1 week ago. Pt hx of hypertension. EXAM: PORTABLE CHEST 1 VIEW COMPARISON:  None. FINDINGS: Normal mediastinum and cardiac silhouette. Normal pulmonary vasculature. No evidence of effusion, infiltrate, or pneumothorax. No acute bony abnormality. IMPRESSION: No acute cardiopulmonary process. Electronically Signed   By: Suzy Bouchard M.D.   On: 02/22/2016 12:24   Ct Renal Stone Study  Result Date: 03/12/2016 CLINICAL DATA:  Abdominal pain and hypotension. History renal cell carcinoma. Status post left nephrectomy 03/08/2016 EXAM: CT ABDOMEN AND PELVIS WITHOUT CONTRAST TECHNIQUE: Multidetector CT imaging of the abdomen and pelvis was performed following the standard protocol without IV contrast. COMPARISON:  CT abdomen pelvis 02/22/2016 FINDINGS: Lower chest: Small left pleural effusion and associated atelectasis. Trace right pleural effusion. Hepatobiliary: Scattered hepatic calcified granulomata. The gallbladder is distended. Pancreas: Normal pancreatic contours.  No peripancreatic fluid collection or pancreatic ductal dilatation. Spleen: The spleen is shrunken with numerous calcified granulomata. Adrenals/Urinary Tract: The left adrenal is enlarged with a post hemorrhagic appearance. The patient is status post left nephrectomy. Small amount of fluid and inflammatory change in the left renal fossa. No right hydronephrosis. Small calcification within  the midportion of the right kidney. Right lower pole renal cyst measures up to 3.6 cm. Stomach/Bowel: There is inflammatory stranding surrounding the transverse colon at the splenic flexure. No evidence of small-bowel obstruction. No discrete intra-abdominal fluid collection. There is rectosigmoid diverticulosis without clear evidence of acute diverticulitis. Normal appendix. Vascular/Lymphatic: There is atherosclerotic calcification of the abdominal aorta. Distal abdominal aortic aneurysm is unchanged in size measuring approximately 3.4 cm. No abdominal or pelvic adenopathy. Reproductive: Multiple brachytherapy seeds within the prostate. Seminal vesicles are normal. Musculoskeletal: Severe multilevel lumbar facet arthrosis and osteophytosis. No bony spinal canal stenosis. No focal lytic lesions. Multifocal subcutaneous emphysema. Other: There is large volume pneumoperitoneum within the anterior abdomen, much greater than expected, even in the context of recent surgery. IMPRESSION: 1. Large volume pneumoperitoneum within the anterior abdomen, much greater than expected following surgery. The appearance is concerning for bowel perforation. Fifth 2. Wall thickening and inflammatory stranding surrounding the distal transverse colon and proximal descending colon near the splenic flexure. 3. Small left adrenal hematoma. 4. Aortic atherosclerosis with infrarenal abdominal aortic aneurysm measuring up to 3.4 cm. 5. Status post left nephrectomy. Multifocal retroperitoneal loculated gas may be postoperative. Critical Value/emergent results were called by telephone at the time of interpretation on 03/12/2016 at 11:51 pm to Dr. Meade Maw , who verbally acknowledged these results. Electronically Signed   By: Ulyses Jarred M.D.   On: 03/12/2016 23:51   Ct Renal Stone Study  Result Date: 02/22/2016 CLINICAL DATA:  Pain following fall EXAM: CT ABDOMEN AND PELVIS WITHOUT CONTRAST TECHNIQUE: Multidetector CT imaging of the  abdomen and pelvis was performed following the standard protocol without oral or intravenous contrast material administration. COMPARISON:  None. FINDINGS: Lower chest: There is mild bibasilar lung scarring. No edema or consolidation is noted in the lung bases. There are foci of coronary artery calcification. Hepatobiliary: There are calcified granulomas in the spleen. There is no perihepatic fluid. There is no obvious liver laceration or rupture on this noncontrast enhanced study. Gallbladder appears mildly distended without wall thickening. There is no appreciable biliary duct dilatation. Pancreas: No pancreatic mass or inflammatory focus. No peripancreatic fluid. Spleen: There are splenic granulomas, calcified. No perisplenic fluid. Spleen appears intact on this noncontrast enhanced study. Adrenals/Urinary Tract: Adrenals appear normal bilaterally. There is a complex solid mass arising from it occupying much of the left kidney measuring 11.4 x 8.8 x 8.6 cm. This mass invades the collecting system but does not clearly invade the left renal vein on this noncontrast enhanced study. There is thickening in the perinephric fascia, but there does not appear to be mass extending beyond the perinephric fascia. There is also a cyst arising from the upper pole of the left kidney measuring 5.0 x 4.4 cm. There is no hydronephrosis on either side. There is no perinephric fluid on either side. There are small calcifications in the mass in the left kidney. In the right kidney, there are several 1-2 mm calculi. There is no ureteral calculus on either side. Urinary bladder is midline with wall thickness within normal limits. Stomach/Bowel: There are multiple sigmoid diverticula without diverticulitis. There are scattered diverticular elsewhere in the colon, primarily in the right  colon. There is no bowel wall or mesenteric thickening. No bowel obstruction. No free air or portal venous air. Vascular/Lymphatic: Aorta is ectatic with  atherosclerotic calcification throughout the aorta. There is dilatation of the distal aorta just proximal to the bifurcation with a maximum transverse diameter of 3.7 x 3.1 cm. Calcification is also noted in both common iliac, external iliac, and hypogastric arteries. The major mesenteric vessels appear patent on this noncontrast enhanced study. No adenopathy is evident in the abdomen or pelvis. Reproductive: There are seed implants throughout the prostate consistent with prostate carcinoma brachytherapy. There is no pelvic mass or pelvic fluid collection. Other: No intraperitoneal or retroperitoneal fluid collections are identified. Appendix appears normal. No abscess or ascites evident in the abdomen or pelvis. There is a small right inguinal hernia which contains a loop of small bowel but no bowel compromise. Musculoskeletal: There is extensive degenerative type change in the thoracic spine. There is no evident acute fracture. No blastic or lytic bone lesions are apparent. No intramuscular or abdominal wall lesions evident. IMPRESSION: **An incidental finding of potential clinical significance has been found. Mass arising from the left kidney consistent with renal cell carcinoma. This mass invades the left renal collecting system but does not appear to invade the left renal vein on this noncontrast enhanced study. This mass measures 11.4 x 8.8 x 8.6 cm.** There is extensive colonic diverticulosis without diverticulitis. No bowel obstruction. No abscess. **An incidental finding of potential clinical significance has been found. Distal abdominal aortic aneurysm with transverse diameter of 3.7 x 3.1 cm. Recommend followup by ultrasound in 2 years. This recommendation follows ACR consensus guidelines: White Paper of the ACR Incidental Findings Committee II on Vascular Findings. J Am Coll Radiol 2013; 10:789-794.** There is a small right inguinal hernia which contains small bowel but no bowel obstruction. Seed  implants in prostate. Evidence of prior granulomatous disease with granulomas in the liver and spleen. Multiple foci of coronary artery calcification noted. No traumatic appearing lesion is evident on this noncontrast enhanced study. Electronically Signed   By: Lowella Grip III M.D.   On: 02/22/2016 14:26    EKG: Sinus bradycardia with PACs  ASSESSMENT AND PLAN:   1. Paroxysmal atrial flutter, in the setting of pneumoperitoneum, and, currently in sinus rhythm with PACs, hemodynamically stable 2. Pneumoperitoneum status post nephrectomy, stabilized after exploratory laparotomy  Recommendations  1. Continue current therapy 2. Defer adding AV blocking agents since patient sinus bradycardia at baseline in this patient develops problematic atrial fibrillation/atrial flutter   Signed: Isaias Cowman MD,PhD, [facility] 03/13/2016, 8:11 AM

## 2016-03-13 NOTE — Progress Notes (Signed)
This patient took over the care of patient around 1500.  Patient to be transfer to 2A with room assignment to 243, report given to [facility], [place] without further questions.  Patient and son updated and belongings packed and chart transferred with patient.

## 2016-03-13 NOTE — Progress Notes (Signed)
Signed out by DR.Mungal for medical consult follow up.Hospitalist will follow as consulting physician from tomorrow

## 2016-03-13 NOTE — Progress Notes (Signed)
Chaplain rounded the unit to provide a compassionate presence and support to the patient. Patient appeared to be sleeping.  Silent prayer was said on behalf of the patient. Frank Bartlett [phone]

## 2016-03-13 NOTE — ED Notes (Signed)
SON (DAVID Dace)  [phone]

## 2016-03-13 NOTE — Anesthesia Preprocedure Evaluation (Addendum)
Anesthesia Evaluation  Patient identified by MRN, date of birth, ID band Patient awake    Reviewed: Allergy & Precautions, H&P , NPO status , Patient's Chart, lab work & pertinent test results  History of Anesthesia Complications Negative for: history of anesthetic complications  Airway Mallampati: II  TM Distance: >3 FB Neck ROM: Full    Dental  (+) Poor Dentition, Chipped   Pulmonary neg pulmonary ROS, neg sleep apnea, neg COPD,    breath sounds clear to auscultation- rhonchi (-) wheezing      Cardiovascular Exercise Tolerance: Good hypertension, Pt. on medications (-) angina(-) Past MI  Rhythm:Regular Rate:Normal - Systolic murmurs and - Diastolic murmurs    Neuro/Psych negative neurological ROS  negative psych ROS   GI/Hepatic negative GI ROS, Neg liver ROS,   Endo/Other  neg diabetesHypothyroidism   Renal/GU Renal disease (renal mass)     Musculoskeletal  (+) Arthritis ,   Abdominal (+) - obese,   Peds  Hematology  (+) anemia ,   Anesthesia Other Findings Past Medical History: No date: B12 deficiency No date: Bradycardia No date: Hyperlipidemia No date: Hypertension No date: Hypothyroidism No date: Osteoarthritis 2001: Prostate cancer ([place])     Comment: Rad tx's + seed implants   Reproductive/Obstetrics                             Anesthesia Physical  Anesthesia Plan  ASA: IV  Anesthesia Plan: General ETT   Post-op Pain Management:    Induction: Intravenous  Airway Management Planned: Oral ETT  Additional Equipment: Arterial line  Intra-op Plan:   Post-operative Plan: Possible Post-op intubation/ventilation  Informed Consent: I have reviewed the patients History and Physical, chart, labs and discussed the procedure including the risks, benefits and alternatives for the proposed anesthesia with the patient or authorized representative who has indicated his/her  understanding and acceptance.   Dental advisory given  Plan Discussed with: Anesthesiologist and CRNA  Anesthesia Plan Comments: (DNR suspended for the procedure.  Patient and family informed that patient is higher risk for complications from anesthesia during this procedure due to their medical history and age including but not limited to post operative cognitive dysfunction.  They voiced understanding. )       Anesthesia Quick Evaluation

## 2016-03-13 NOTE — Progress Notes (Signed)
Patient received on 2A from ICU, no distress noted. Will continue to monitor. Foley intact and draining well.

## 2016-03-13 NOTE — Transfer of Care (Signed)
Immediate Anesthesia Transfer of Care Note  Patient: Frank Bartlett  Procedure(s) Performed: Procedure(s): EXPLORATORY LAPAROTOMY (N/A)  Patient Location: PACU  Anesthesia Type:General  Level of Consciousness: awake, alert  and patient cooperative  Airway & Oxygen Therapy: Patient Spontanous Breathing  Post-op Assessment: Report given to RN and Post -op Vital signs reviewed and stable  Post vital signs: Reviewed and stable  Last Vitals:  Vitals:   03/12/16 2230 03/13/16 0030  BP: 91/72 102/68  Pulse:  (!) 55  Resp: (!) 22 (!) 21    Last Pain: There were no vitals filed for this visit.       Complications: No apparent anesthesia complications

## 2016-03-13 NOTE — Op Note (Signed)
  Procedure Date:  03/13/2016  Pre-operative Diagnosis:  Pneumoperitoneum  Post-operative Diagnosis:  Pneumoperitoneum, with negative laparotomy  Procedure:  Exploratory Laparotomy  Surgeon:  Melvyn Neth, MD  Assistant:  Festus Aloe, MD  Anesthesia:  General endotracheal  Estimated Blood Loss:  50 ml  Specimens:  None  Complications:  None  Indications for Procedure:  This is a 82 y.o. male s/p laparoscopic hand-assisted left nephrectomy on 11/2, who presents with abdominal pain, hypotension, atrial fibrillation, peritonitis, and workup revealing large  pneumoperitoneum.  The risks of bleeding, abscess or infection, injury to surrounding structures, and need for further procedures were all discussed with the patient and was willing to proceed.  Description of Procedure: The patient was correctly identified in the preoperative area and brought into the operating room.  The patient was placed supine with VTE prophylaxis in place.  Appropriate time-outs were performed.  Anesthesia was induced and the patient was intubated.  Foley catheter was placed.  Appropriate antibiotics were infused.  The abdomen was prepped and draped in a sterile fashion.  The previous midline incision was entered and extended superiorly and inferiorly below the umbilicus. Electrocautery was used to dissect down the subcutaneous tissue to the fascia.  The fascia was incised and extended superiorly and inferiorly.  The Bookwalter retractor was placed and the abdomen was explored.  The entire bowel from stomach to rectum was inspected and no perforation or injury was found.  There were areas of inflammation over the transverse and descending colon but no evidence of injury or perforation.  There was no ischemia. The small bowel had some proximal diverticula but no diverticulitis.  The gallbladder was distended but not inflamed.  The left adrenal gland with hematoma noted on CT scan was identified.  Overall, no  source for the patient's large pneumoperitoneum was found.  After running the entire bowel, the abdomen was thoroughly irrigated.  20 ml of Exparel solution was infused into the abdominal wall.  The fascia was closed using loop PDS sutures.  The midline wound was irrigated and closed using staples.  The wound was dressed with gauze and tape.   The patient was emerged from anesthesia and extubated and brought to the recovery room for further management.  The patient tolerated the procedure well and all counts were correct at the end of the case.   Melvyn Neth, MD

## 2016-03-13 NOTE — ED Notes (Signed)
PT TRANSPORTED TO OR

## 2016-03-13 NOTE — H&P (Signed)
H&P  Chief Complaint: free air in abdomen   History of Present Illness: 82 yo POD#4 Left hand-assisted laparoscopic radical nephrectomy c/o constipation and abd pain earlier today. His last BM was day before surgery, but pt did have a large BM here. However, he's been hypotensive and in new onset a fib. CT scan revealed significant free air in abdomen (more than expected from post-op) and wall thickening and inflammatory stranding surrounding the distal transverse colon and proximal descending colon near the splenic flexure.    Past Medical History:  Diagnosis Date  . B12 deficiency   . Bradycardia   . Hyperlipidemia   . Hypertension   . Hypothyroidism   . Osteoarthritis   . Prostate cancer ([place]) 2001   Rad tx's + seed implants   Past Surgical History:  Procedure Laterality Date  . CATARACT EXTRACTION, BILATERAL    . COLONOSCOPY    . EXCISIONAL HEMORRHOIDECTOMY    . INSERTION PROSTATE RADIATION SEED    . LAPAROSCOPIC NEPHRECTOMY, HAND ASSISTED Left 03/08/2016   Procedure: HAND ASSISTED LAPAROSCOPIC NEPHRECTOMY;  Surgeon: Nickie Retort, MD;  Location: [facility] ORS;  Service: Urology;  Laterality: Left;    Home Medications:   (Not in a hospital admission) Allergies: No Known Allergies  Family History  Problem Relation Age of Onset  . Hypertension Mother   . Breast cancer Mother   . Prostate cancer Neg Hx    Social History:  reports that he has never smoked. He has never used smokeless tobacco. He reports that he does not drink alcohol or use drugs.  ROS: A complete review of systems was performed.  All systems are negative except for pertinent findings as noted. ROS   Physical Exam:  Vital signs in last 24 hours: Pulse Rate:  [55] 55 (11/07 0030) Resp:  [21-22] 21 (11/07 0030) BP: (91-102)/(68-72) 102/68 (11/07 0030) Weight:  [72.6 kg (160 lb)] 72.6 kg (160 lb) (11/06 2147) General:  No acute distress, alert and oriented  HEENT: Normocephalic, atraumatic Neck: No  JVD or lymphadenopathy Cardiovascular: Regular rate and rhythm Lungs: Regular rate and effort Abdomen: Soft, nontender, nondistended, no abdominal masses; incision C/D/I  Extremities: No edema Neurologic: Grossly intact  Laboratory Data:  Results for orders placed or performed during the hospital encounter of 03/12/16 (from the past 24 hour(s))  CBC with Differential/Platelet     Status: Abnormal   Collection Time: 03/12/16 10:16 PM  Result Value Ref Range   WBC 10.9 (H) 3.8 - 10.6 K/uL   RBC 4.19 (L) 4.40 - 5.90 MIL/uL   Hemoglobin 13.5 13.0 - 18.0 g/dL   HCT 38.5 (L) 40.0 - 52.0 %   MCV 91.8 80.0 - 100.0 fL   MCH 32.2 26.0 - 34.0 pg   MCHC 35.1 32.0 - 36.0 g/dL   RDW 13.4 11.5 - 14.5 %   Platelets 445 (H) 150 - 440 K/uL   Neutrophils Relative % 89 %   Neutro Abs 9.7 (H) 1.4 - 6.5 K/uL   Lymphocytes Relative 4 %   Lymphs Abs 0.5 (L) 1.0 - 3.6 K/uL   Monocytes Relative 5 %   Monocytes Absolute 0.6 0.2 - 1.0 K/uL   Eosinophils Relative 1 %   Eosinophils Absolute 0.1 0 - 0.7 K/uL   Basophils Relative 1 %   Basophils Absolute 0.1 0 - 0.1 K/uL  Comprehensive metabolic panel     Status: Abnormal   Collection Time: 03/12/16 10:16 PM  Result Value Ref Range   Sodium 132 (L)  135 - 145 mmol/L   Potassium 4.0 3.5 - 5.1 mmol/L   Chloride 95 (L) 101 - 111 mmol/L   CO2 23 22 - 32 mmol/L   Glucose, Bld 133 (H) 65 - 99 mg/dL   BUN 33 (H) 6 - 20 mg/dL   Creatinine, Ser 2.11 (H) 0.61 - 1.24 mg/dL   Calcium 9.3 8.9 - 10.3 mg/dL   Total Protein 7.5 6.5 - 8.1 g/dL   Albumin 3.2 (L) 3.5 - 5.0 g/dL   AST 40 15 - 41 U/L   ALT 13 (L) 17 - 63 U/L   Alkaline Phosphatase 242 (H) 38 - 126 U/L   Total Bilirubin 0.7 0.3 - 1.2 mg/dL   GFR calc non Af Amer 27 (L) >60 mL/min   GFR calc Af Amer 31 (L) >60 mL/min   Anion gap 14 5 - 15  Lipase, blood     Status: None   Collection Time: 03/12/16 10:16 PM  Result Value Ref Range   Lipase 39 11 - 51 U/L   No results found for this or any previous  visit (from the past 240 hour(s)). Creatinine:  Recent Labs  03/09/16 0450 03/10/16 0522 03/12/16 2216  CREATININE 1.44* 1.33* 2.11*    Impression/Assessment/plan: POD#4 Left hand assist lap nephrectomy with free air in abd and stranding around colon. The patient has been seen and examined by Dr. Hampton Abbot and he recommends ex lap. He discussed with pt's son and consented pt for surgery. GU will assist and admit patient post-op. Will consult hospitalist for medical issues, a fib, etc. I discussed with patient and patient's son and he and his dad elect to proceed. Path was CHROMOPHOBE RENAL CELL CARCINOMA, 9.7 CM, WITH INVASION OF BRANCHES OF THE RENAL VEIN AND THE PELVICALYCEAL SYSTEM ([id number]), margins negative.   Loletha Bertini 03/13/2016, 1:38 AM

## 2016-03-13 NOTE — ED Notes (Signed)
Piscoya, MD at bedside for surgical procedure education.

## 2016-03-13 NOTE — Consult Note (Signed)
Date of Consultation:  03/13/2016  Requesting Physician:  Dr. Meade Maw  Reason for Consultation:  pneumoperitoneum  History of Present Illness: Frank Bartlett is a 82 y.o. male recently s/p laparoscopic hand-assisted left nephrectomy on 11/2 with Dr. Pilar Jarvis.  He was discharged on 11/4 to [facility].  Per his son, [name], the patient had been doing well after discharge and was tolerating a diet and had not had any issues until today.  He had been constipated and was getting many laxatives at the nursing home.  Today, he was called reporting that the patient was hypotensive with SBP in 70s, and was feeling colder and clammy.  He was brought into the Emergency Room.  Denies having any fevers, chest pain, shortness of breath.  Reports having one small episode of emesis at the nursing home after all the laxatives.  He does report crampy pain after the laxatives and has been having pain in the emergency room as well.  Had a very large bowel movement in the emergency room.    He has received IV fluid bolus and currently his SBP is low 100s, with new onset of irregular rhythm with low grade tachycardia.  Workup with CT scan revealed large amount of pneumoperitoneum with some inflammatory changes of the transverse and descending colon.  Surgery and Urology have been consulted for further evaluation.    Past Medical History: Past Medical History:  Diagnosis Date  . B12 deficiency   . Bradycardia   . Hyperlipidemia   . Hypertension   . Hypothyroidism   . Osteoarthritis   . Prostate cancer ([place]) 2001   Rad tx's + seed implants     Past Surgical History: Past Surgical History:  Procedure Laterality Date  . CATARACT EXTRACTION, BILATERAL    . COLONOSCOPY    . EXCISIONAL HEMORRHOIDECTOMY    . INSERTION PROSTATE RADIATION SEED    . LAPAROSCOPIC NEPHRECTOMY, HAND ASSISTED Left 03/08/2016   Procedure: HAND ASSISTED LAPAROSCOPIC NEPHRECTOMY;  Surgeon: Nickie Retort, MD;  Location: [facility]  ORS;  Service: Urology;  Laterality: Left;    Home Medications: Prior to Admission medications   Medication Sig Start Date End Date Taking? Authorizing Provider  aspirin EC 81 MG tablet Take 81 mg by mouth daily.    Yes Historical Provider, MD  Cyanocobalamin (RA VITAMIN B-12 TR) 1000 MCG TBCR Take 1,000 mcg by mouth daily.    Yes Historical Provider, MD  feeding supplement, ENSURE ENLIVE, (ENSURE ENLIVE) LIQD Take 237 mLs by mouth 2 (two) times daily with a meal. Patient taking differently: Take 237 mLs by mouth 2 (two) times daily as needed.  07/01/15  Yes Theodoro Grist, MD  HYDROcodone-acetaminophen (NORCO/VICODIN) 5-325 MG tablet Take 1-2 tablets by mouth every 4 (four) hours as needed for moderate pain. 03/09/16  Yes Nickie Retort, MD  levothyroxine (SYNTHROID, LEVOTHROID) 112 MCG tablet Take 112 mcg by mouth daily before breakfast.   Yes Historical Provider, MD  lisinopril-hydrochlorothiazide (PRINZIDE,ZESTORETIC) 20-12.5 MG tablet take 1 tablet by mouth once daily 12/30/15  Yes Historical Provider, MD  meloxicam (MOBIC) 15 MG tablet Take 7.5 mg by mouth.  02/15/16 02/14/17 Yes Historical Provider, MD  Multiple Vitamin (MULTI-VITAMINS) TABS Take by mouth.   Yes Historical Provider, MD  senna-docusate (SENOKOT-S) 8.6-50 MG tablet Take 1 tablet by mouth 2 (two) times daily. While taking strong pain medication to prevent constipation 03/10/16  Yes Alexis Frock, MD    Allergies: No Known Allergies  Social History:  reports that he has  never smoked. He has never used smokeless tobacco. He reports that he does not drink alcohol or use drugs.   Family History: Family History  Problem Relation Age of Onset  . Hypertension Mother   . Breast cancer Mother   . Prostate cancer Neg Hx     Review of Systems: ROS  Physical Exam BP 102/68   Pulse (!) 55   Resp (!) 21   Ht 5\' 9"  (1.753 m)   Wt 72.6 kg (160 lb)   BMI 23.63 kg/m  CONSTITUTIONAL: Weak, alert, no acute distress HEENT:   Normocephalic, atraumatic, extraocular motion intact. NECK: Trachea is midline, and there is no jugular venous distension. LYMPH NODES:  Lymph nodes in the neck are not enlarged. RESPIRATORY:  Lungs are clear, and breath sounds are equal bilaterally. Normal respiratory effort without pathologic use of accessory muscles. CARDIOVASCULAR: Irregular rhythm and low grade tachycardia.  Atrial fibrillation per monitor. GI: The abdomen is soft, mildly distended, tympanitic to percussion, with tenderness to palpation diffusely.  Incisions are clean, dry, and intact, with mild ecchymosis over the midline incision. MUSCULOSKELETAL:  Normal muscle strength and tone in all four extremities.  No peripheral edema or cyanosis. SKIN: Skin turgor is normal. There are no pathologic skin lesions.  NEUROLOGIC:  Motor and sensation is grossly normal.  Cranial nerves are grossly intact. PSYCH:  Alert and oriented to person, place and time. Affect is normal.  Laboratory Analysis: Results for orders placed or performed during the hospital encounter of 03/12/16 (from the past 24 hour(s))  CBC with Differential/Platelet     Status: Abnormal   Collection Time: 03/12/16 10:16 PM  Result Value Ref Range   WBC 10.9 (H) 3.8 - 10.6 K/uL   RBC 4.19 (L) 4.40 - 5.90 MIL/uL   Hemoglobin 13.5 13.0 - 18.0 g/dL   HCT 38.5 (L) 40.0 - 52.0 %   MCV 91.8 80.0 - 100.0 fL   MCH 32.2 26.0 - 34.0 pg   MCHC 35.1 32.0 - 36.0 g/dL   RDW 13.4 11.5 - 14.5 %   Platelets 445 (H) 150 - 440 K/uL   Neutrophils Relative % 89 %   Neutro Abs 9.7 (H) 1.4 - 6.5 K/uL   Lymphocytes Relative 4 %   Lymphs Abs 0.5 (L) 1.0 - 3.6 K/uL   Monocytes Relative 5 %   Monocytes Absolute 0.6 0.2 - 1.0 K/uL   Eosinophils Relative 1 %   Eosinophils Absolute 0.1 0 - 0.7 K/uL   Basophils Relative 1 %   Basophils Absolute 0.1 0 - 0.1 K/uL  Comprehensive metabolic panel     Status: Abnormal   Collection Time: 03/12/16 10:16 PM  Result Value Ref Range   Sodium  132 (L) 135 - 145 mmol/L   Potassium 4.0 3.5 - 5.1 mmol/L   Chloride 95 (L) 101 - 111 mmol/L   CO2 23 22 - 32 mmol/L   Glucose, Bld 133 (H) 65 - 99 mg/dL   BUN 33 (H) 6 - 20 mg/dL   Creatinine, Ser 2.11 (H) 0.61 - 1.24 mg/dL   Calcium 9.3 8.9 - 10.3 mg/dL   Total Protein 7.5 6.5 - 8.1 g/dL   Albumin 3.2 (L) 3.5 - 5.0 g/dL   AST 40 15 - 41 U/L   ALT 13 (L) 17 - 63 U/L   Alkaline Phosphatase 242 (H) 38 - 126 U/L   Total Bilirubin 0.7 0.3 - 1.2 mg/dL   GFR calc non Af Amer 27 (L) >60 mL/min  GFR calc Af Amer 31 (L) >60 mL/min   Anion gap 14 5 - 15  Lipase, blood     Status: None   Collection Time: 03/12/16 10:16 PM  Result Value Ref Range   Lipase 39 11 - 51 U/L    Imaging: Ct Renal Stone Study  Result Date: 03/12/2016 CLINICAL DATA:  Abdominal pain and hypotension. History renal cell carcinoma. Status post left nephrectomy 03/08/2016 EXAM: CT ABDOMEN AND PELVIS WITHOUT CONTRAST TECHNIQUE: Multidetector CT imaging of the abdomen and pelvis was performed following the standard protocol without IV contrast. COMPARISON:  CT abdomen pelvis 02/22/2016 FINDINGS: Lower chest: Small left pleural effusion and associated atelectasis. Trace right pleural effusion. Hepatobiliary: Scattered hepatic calcified granulomata. The gallbladder is distended. Pancreas: Normal pancreatic contours. No peripancreatic fluid collection or pancreatic ductal dilatation. Spleen: The spleen is shrunken with numerous calcified granulomata. Adrenals/Urinary Tract: The left adrenal is enlarged with a post hemorrhagic appearance. The patient is status post left nephrectomy. Small amount of fluid and inflammatory change in the left renal fossa. No right hydronephrosis. Small calcification within the midportion of the right kidney. Right lower pole renal cyst measures up to 3.6 cm. Stomach/Bowel: There is inflammatory stranding surrounding the transverse colon at the splenic flexure. No evidence of small-bowel obstruction. No  discrete intra-abdominal fluid collection. There is rectosigmoid diverticulosis without clear evidence of acute diverticulitis. Normal appendix. Vascular/Lymphatic: There is atherosclerotic calcification of the abdominal aorta. Distal abdominal aortic aneurysm is unchanged in size measuring approximately 3.4 cm. No abdominal or pelvic adenopathy. Reproductive: Multiple brachytherapy seeds within the prostate. Seminal vesicles are normal. Musculoskeletal: Severe multilevel lumbar facet arthrosis and osteophytosis. No bony spinal canal stenosis. No focal lytic lesions. Multifocal subcutaneous emphysema. Other: There is large volume pneumoperitoneum within the anterior abdomen, much greater than expected, even in the context of recent surgery. IMPRESSION: 1. Large volume pneumoperitoneum within the anterior abdomen, much greater than expected following surgery. The appearance is concerning for bowel perforation. Fifth 2. Wall thickening and inflammatory stranding surrounding the distal transverse colon and proximal descending colon near the splenic flexure. 3. Small left adrenal hematoma. 4. Aortic atherosclerosis with infrarenal abdominal aortic aneurysm measuring up to 3.4 cm. 5. Status post left nephrectomy. Multifocal retroperitoneal loculated gas may be postoperative. Critical Value/emergent results were called by telephone at the time of interpretation on 03/12/2016 at 11:51 pm to Dr. Meade Maw , who verbally acknowledged these results. Electronically Signed   By: Ulyses Jarred M.D.   On: 03/12/2016 23:51    Assessment and Plan: This is a 82 y.o. male who presents with large pneumoperitoneum of unclear etiology.  I have reviewed his laboratory and imaging studies and have discussed them with the patient and the Urology team.  Given his recent surgery with manipulation and mobilization of the left and transverse colon, that could be the source of pneumoperitoneum.  He also does have diverticular disease  which could be a culprit.  Nevertheless, given his presenting symptoms and CT scan findings, this warrants taking the patient to the Operating Room for an exploratory laparotomy.   Patient will be admitted to the Urology team and General Surgery will assist in the Operating room. Risks and benefits of the procedure were explained to the patient and his son.  They understand that he may require ICU care post-operatively as well as Hospitalist consult for his new onset atrial fibrillation.  His code status is Do Not Resuscitate.   Melvyn Neth, [place]

## 2016-03-13 NOTE — Progress Notes (Signed)
Urology Inpatient Progress Report  Bowel perforation ([place]) [K63.1] Abdominal pain [R10.9]  Procedure(s): EXPLORATORY LAPAROTOMY  Day of Surgery   Intv/Subj: Readmitted for hypotension and afib - negative exlap for pneumoperitoneum Back in NSR - cardiology following General Surgery signed off Aline out, NG tube removed. Awake with minimal complaints of pain Alert and oriented   Active Problems:   Pneumoperitoneum   Generalized abdominal pain   Atrial fibrillation (HCC)   Cough  Current Facility-Administered Medications  Medication Dose Route Frequency Provider Last Rate Last Dose  . 0.9 %  sodium chloride infusion   Intravenous Continuous Festus Aloe, MD 100 mL/hr at 03/13/16 1300    . acetaminophen (TYLENOL) tablet 650 mg  650 mg Oral Q4H PRN Festus Aloe, MD      . aspirin EC tablet 81 mg  81 mg Oral Daily Festus Aloe, MD      . bisacodyl (DULCOLAX) suppository 10 mg  10 mg Rectal Daily PRN Awilda Bill, NP      . Derrill Memo ON 03/14/2016] enoxaparin (LOVENOX) injection 30 mg  30 mg Subcutaneous Q24H Festus Aloe, MD      . levothyroxine (SYNTHROID, LEVOTHROID) tablet 112 mcg  112 mcg Oral QAC breakfast Festus Aloe, MD      . morphine 2 MG/ML injection 1-2 mg  1-2 mg Intravenous Q4H PRN Awilda Bill, NP      . morphine 2 MG/ML injection 2-4 mg  2-4 mg Intravenous Q2H PRN Festus Aloe, MD      . ondansetron [facility]) injection 4 mg  4 mg Intravenous Q6H PRN Awilda Bill, NP      . oxyCODONE-acetaminophen (PERCOCET/ROXICET) 5-325 MG per tablet 1-2 tablet  1-2 tablet Oral Q4H PRN Festus Aloe, MD      . piperacillin-tazobactam (ZOSYN) IVPB 3.375 g  3.375 g Intravenous Camelia Phenes Festus Aloe, MD   3.375 g at 03/13/16 1015     Objective: Vital: Vitals:   03/13/16 1100 03/13/16 1200 03/13/16 1230 03/13/16 1300  BP: 103/68 100/62 (!) 81/64 116/61  Pulse: (!) 56 (!) 53 (!) 59 (!) 56  Resp: (!) 21 (!) 24 19 (!) 24  Temp:   98.1 F (36.7 C)    TempSrc:   Oral   SpO2: 100% 100% 97% 100%  Weight:      Height:       I/Os: I/O last 3 completed shifts: In: 1700 [I.V.:1700] Out: 155 [Urine:105; Blood:50]  Physical Exam:  General: Patient is in no apparent distress Lungs: Normal respiratory effort, chest expands symmetrically. GI: Incisions are c/d/i. The abdomen is soft and appropriately tender. Foley: draining clear yellow urine  Ext: lower extremities symmetric  Lab Results:  Recent Labs  03/12/16 2216 03/13/16 0526  WBC 10.9* 9.8  HGB 13.5 11.5*  HCT 38.5* 33.4*    Recent Labs  03/12/16 2216 03/13/16 0526  NA 132* 132*  K 4.0 3.8  CL 95* 103  CO2 23 21*  GLUCOSE 133* 155*  BUN 33* 34*  CREATININE 2.11* 2.09*  CALCIUM 9.3 7.6*   No results for input(s): LABPT, INR in the last 72 hours. No results for input(s): LABURIN in the last 72 hours. Results for orders placed or performed during the hospital encounter of 03/12/16  MRSA PCR Screening     Status: None   Collection Time: 03/13/16  6:45 AM  Result Value Ref Range Status   MRSA by PCR NEGATIVE NEGATIVE Final    Comment:        The  GeneXpert MRSA Assay (FDA approved for NASAL specimens only), is one component of a comprehensive MRSA colonization surveillance program. It is not intended to diagnose MRSA infection nor to guide or monitor treatment for MRSA infections.     Studies/Results: Dg Chest Port 1 View  Result Date: 03/13/2016 CLINICAL DATA:  Status post laparoscopic nephrectomy on November 2nd with onset of abdominal pain resulting in ER visit on November 7th. EXAM: PORTABLE CHEST 1 VIEW COMPARISON:  Portable chest x-ray of February 22, 2016 FINDINGS: The lungs are well-expanded. There is minimal linear density at the left lung base. There is no pleural effusion or pneumothorax. The cardiac silhouette is top-normal in size. The pulmonary vascularity is not engorged. There is calcification in the wall of the aortic arch. The esophagogastric  tube tip in proximal port lie below the GE junction. IMPRESSION: Minimal left basilar atelectasis.  No pneumonia nor CHF. Aortic atherosclerosis. Electronically Signed   By: David  Martinique M.D.   On: 03/13/2016 09:28   Ct Renal Stone Study  Result Date: 03/12/2016 CLINICAL DATA:  Abdominal pain and hypotension. History renal cell carcinoma. Status post left nephrectomy 03/08/2016 EXAM: CT ABDOMEN AND PELVIS WITHOUT CONTRAST TECHNIQUE: Multidetector CT imaging of the abdomen and pelvis was performed following the standard protocol without IV contrast. COMPARISON:  CT abdomen pelvis 02/22/2016 FINDINGS: Lower chest: Small left pleural effusion and associated atelectasis. Trace right pleural effusion. Hepatobiliary: Scattered hepatic calcified granulomata. The gallbladder is distended. Pancreas: Normal pancreatic contours. No peripancreatic fluid collection or pancreatic ductal dilatation. Spleen: The spleen is shrunken with numerous calcified granulomata. Adrenals/Urinary Tract: The left adrenal is enlarged with a post hemorrhagic appearance. The patient is status post left nephrectomy. Small amount of fluid and inflammatory change in the left renal fossa. No right hydronephrosis. Small calcification within the midportion of the right kidney. Right lower pole renal cyst measures up to 3.6 cm. Stomach/Bowel: There is inflammatory stranding surrounding the transverse colon at the splenic flexure. No evidence of small-bowel obstruction. No discrete intra-abdominal fluid collection. There is rectosigmoid diverticulosis without clear evidence of acute diverticulitis. Normal appendix. Vascular/Lymphatic: There is atherosclerotic calcification of the abdominal aorta. Distal abdominal aortic aneurysm is unchanged in size measuring approximately 3.4 cm. No abdominal or pelvic adenopathy. Reproductive: Multiple brachytherapy seeds within the prostate. Seminal vesicles are normal. Musculoskeletal: Severe multilevel lumbar  facet arthrosis and osteophytosis. No bony spinal canal stenosis. No focal lytic lesions. Multifocal subcutaneous emphysema. Other: There is large volume pneumoperitoneum within the anterior abdomen, much greater than expected, even in the context of recent surgery. IMPRESSION: 1. Large volume pneumoperitoneum within the anterior abdomen, much greater than expected following surgery. The appearance is concerning for bowel perforation. Fifth 2. Wall thickening and inflammatory stranding surrounding the distal transverse colon and proximal descending colon near the splenic flexure. 3. Small left adrenal hematoma. 4. Aortic atherosclerosis with infrarenal abdominal aortic aneurysm measuring up to 3.4 cm. 5. Status post left nephrectomy. Multifocal retroperitoneal loculated gas may be postoperative. Critical Value/emergent results were called by telephone at the time of interpretation on 03/12/2016 at 11:51 pm to Dr. Meade Maw , who verbally acknowledged these results. Electronically Signed   By: Ulyses Jarred M.D.   On: 03/12/2016 23:51    Assessment: Procedure(s): EXPLORATORY LAPAROTOMY, Day of Surgery  doing well. POD#5 s/p left radical nephrectomy Paroxysmal Afib - back in NSR Hypotension resolved with fluid/ rate control Pneumoperitoneum residual from prior laparoscopic surgery  Plan: IV APAP scheduled x 24 hours, Tramadol 50-100mg  q6PRN for pain  MIVF at 75cc/hr, HLIVF once taking adequate PO Cardiology following - no medication at this point given he is back in NSR with some bradycardia and normotensive.  Appreciate their input. D/c Foley in AM Clear liquid diet, ADAT PT/OT eval and treat, activity ad lib Encourage IS/ OOB D/c Zosyn Transfer to telemetry med-surg bed Expect d/c to SNF at the end of the week.   Louis Meckel, MD Urology 03/13/2016, 1:36 PM

## 2016-03-14 ENCOUNTER — Other Ambulatory Visit: Payer: Self-pay | Admitting: Pathology

## 2016-03-14 LAB — BASIC METABOLIC PANEL
Anion gap: 10 (ref 5–15)
BUN: 34 mg/dL — AB (ref 6–20)
CHLORIDE: 103 mmol/L (ref 101–111)
CO2: 21 mmol/L — AB (ref 22–32)
CREATININE: 1.97 mg/dL — AB (ref 0.61–1.24)
Calcium: 7.5 mg/dL — ABNORMAL LOW (ref 8.9–10.3)
GFR calc Af Amer: 33 mL/min — ABNORMAL LOW (ref >60)
GFR calc non Af Amer: 29 mL/min — ABNORMAL LOW (ref >60)
GLUCOSE: 84 mg/dL (ref 65–99)
Potassium: 3.4 mmol/L — ABNORMAL LOW (ref 3.5–5.1)
Sodium: 134 mmol/L — ABNORMAL LOW (ref 135–145)

## 2016-03-14 LAB — CBC
HCT: 30.4 % — ABNORMAL LOW (ref 40.0–52.0)
HEMOGLOBIN: 10.6 g/dL — AB (ref 13.0–18.0)
MCH: 31.9 pg (ref 26.0–34.0)
MCHC: 34.8 g/dL (ref 32.0–36.0)
MCV: 91.7 fL (ref 80.0–100.0)
Platelets: 293 10*3/uL (ref 150–440)
RBC: 3.32 MIL/uL — ABNORMAL LOW (ref 4.40–5.90)
RDW: 13.9 % (ref 11.5–14.5)
WBC: 9.2 10*3/uL (ref 3.8–10.6)

## 2016-03-14 LAB — GLUCOSE, CAPILLARY: GLUCOSE-CAPILLARY: 72 mg/dL (ref 65–99)

## 2016-03-14 MED ORDER — SODIUM CHLORIDE 0.9 % IV BOLUS (SEPSIS)
500.0000 mL | Freq: Once | INTRAVENOUS | Status: AC
  Administered 2016-03-14: 500 mL via INTRAVENOUS

## 2016-03-14 MED ORDER — SODIUM CHLORIDE 0.9 % IV SOLN
INTRAVENOUS | Status: DC
  Administered 2016-03-14 (×2): via INTRAVENOUS

## 2016-03-14 MED ORDER — BISACODYL 10 MG RE SUPP
10.0000 mg | Freq: Every day | RECTAL | Status: DC
  Filled 2016-03-14: qty 1

## 2016-03-14 NOTE — Clinical Social Work Note (Deleted)
Social work assessment completed on 03/09/16, no changes to assessment.  MSW to continue to follow patient's progress and facilitate discharge planning back to [facility] once he is medically ready for discharge and orders have been received.  Jones Broom. Norval Morton, MSW [phone]  Mon-Fri 8a-4:30p 03/14/2016 4:59 PM

## 2016-03-14 NOTE — Progress Notes (Signed)
Patient up to chair with walker and +2 assist this AM. Tolerated well with minimal pain. Positive for BM this morning as well.

## 2016-03-14 NOTE — Progress Notes (Signed)
No events overnight Low uop (<500 cc since admission) Pain controlled Tolerating sips No n/v/f/c/bm/flatus Now in sinus rhythm   Vitals:   03/13/16 1930 03/13/16 2030 03/14/16 0414 03/14/16 0827  BP: 123/65  124/60 139/68  Pulse: 80  80 (!) 48  Resp: 14  15 18   Temp: 98.4 F (36.9 C)  97.8 F (36.6 C)   TempSrc: Oral     SpO2: 100% 97% 97% 99%  Weight:      Height:       I/O last 3 completed shifts: In: 3629.6 [I.V.:3379.6; IV Piggyback:250] Out: 405 [Urine:355; Blood:50] No intake/output data recorded.   NAD Soft approp T mild D Inc c/d/i Foley clear  CBC    Component Value Date/Time   WBC 9.8 03/13/2016 0526   RBC 3.64 (L) 03/13/2016 0526   HGB 11.5 (L) 03/13/2016 0526   HCT 33.4 (L) 03/13/2016 0526   PLT 329 03/13/2016 0526   MCV 91.6 03/13/2016 0526   MCH 31.6 03/13/2016 0526   MCHC 34.5 03/13/2016 0526   RDW 13.2 03/13/2016 0526   LYMPHSABS 0.5 (L) 03/12/2016 2216   MONOABS 0.6 03/12/2016 2216   EOSABS 0.1 03/12/2016 2216   BASOSABS 0.1 03/12/2016 2216   BMP Latest Ref Rng & Units 03/13/2016 03/12/2016 03/10/2016  Glucose 65 - 99 mg/dL 155(H) 133(H) 97  BUN 6 - 20 mg/dL 34(H) 33(H) 17  Creatinine 0.61 - 1.24 mg/dL 2.09(H) 2.11(H) 1.33(H)  Sodium 135 - 145 mmol/L 132(L) 132(L) 134(L)  Potassium 3.5 - 5.1 mmol/L 3.8 4.0 4.1  Chloride 101 - 111 mmol/L 103 95(L) 102  CO2 22 - 32 mmol/L 21(L) 23 28  Calcium 8.9 - 10.3 mg/dL 7.6(L) 9.3 8.2(L)     POD 6/1 lap hand assist L nx/negative ex lap. Progressing as expected -low uop -> bolus now. Resume NS IVF -adv to clears -f/u pending labs -monitor uop -dulcolax supp -PT eval pending -continue foley for now to monitor low uop -appreciate cardiology recs - no treatment at this point as sinus rhythm has returned

## 2016-03-14 NOTE — Evaluation (Signed)
Physical Therapy Evaluation Patient Details Name: Frank Bartlett MRN: [id number] DOB: [birth year] Today's Date: 03/14/2016   History of Present Illness  Pt is an 82 y.o. male presenting to hospital with c/o constipation and abdominal pain.  Pt found to have paroxysmal aflutter and hypotension.  Imaging showing large pneumoperitoneum.  Pt s/p exploratory lap 03/13/16.  Of note, pt recently discharged from hospital after surgery for laparoscopic radical nephrectomy secondary to renal mass 03/08/16.  PMH includes bradycardia, htn, prostate CA, c-diff; per PT note 03/10/16 pt with L>R end range resting nystagmus B.  Clinical Impression  Prior to recent hospital admissions, pt was independent with functional mobility.  Pt lives alone in [place] at [facility] but recently discharged from the hospital to the [place] at [facility].  Currently pt is min to mod assist to stand with RW and min assist to ambulate short distances (to bathroom and back) with RW.  Pt requesting to use toilet beginning of session and required pacing and rest breaks d/t fatigue/decreased activity tolerance.  Pt would benefit from skilled PT to address noted impairments and functional limitations.  Recommend pt discharge to STR when medically appropriate.    Follow Up Recommendations SNF    Equipment Recommendations  Rolling walker with 5" wheels    Recommendations for Other Services       Precautions / Restrictions Precautions Precautions: Fall Precaution Comments: long abdominal incision Restrictions Weight Bearing Restrictions: No      Mobility  Bed Mobility               General bed mobility comments: Deferred d/t pt already sitting up in chair beginning of session and requesting to be in chair end of session.  Transfers Overall transfer level: Needs assistance Equipment used: Rolling walker (2 wheeled) Transfers: Sit to/from Stand Sit to Stand: Min assist;Mod assist         General transfer  comment: mod assist to stand from recliner; min assist for partial stand from toilet to get cleaned up; min assist to come to stand from toilet (after sitting rest break);  vc's required for hand placement and upright posture once standing  Ambulation/Gait Ambulation/Gait assistance: Min assist Ambulation Distance (Feet):  (20 feet x2 (recliner to/from bathroom)) Assistive device: Rolling walker (2 wheeled)   Gait velocity: decreased   General Gait Details: decreased B step length/foot clearance/heelstrike; vc's for upright posture required (flexed trunk d/t abdominal pain); increased UE support on RW  Stairs            Wheelchair Mobility    Modified Rankin (Stroke Patients Only)       Balance Overall balance assessment: Needs assistance Sitting-balance support: Bilateral upper extremity supported;Feet supported Sitting balance-Leahy Scale: Fair     Standing balance support: Bilateral upper extremity supported (on RW) Standing balance-Leahy Scale: Fair                               Pertinent Vitals/Pain Pain Assessment: 0-10 Pain Score: 4  Pain Location: abdomen Pain Descriptors / Indicators: Aching;Sore;Tender Pain Intervention(s): Limited activity within patient's tolerance;Monitored during session;Premedicated before session;Repositioned    Home Living Family/patient expects to be discharged to:: Assisted living               Home Equipment: Walker - 4 wheels Additional Comments: Pt at [place] living, wife is at [employer] care portion.    Prior Function Level of Independence: Independent  Comments: Until recently he had apparently been able to walk for >1 mile for exercise, ran errands, and was generally active     Hand Dominance        Extremity/Trunk Assessment   Upper Extremity Assessment: Generalized weakness           Lower Extremity Assessment: Generalized weakness         Communication    Communication: No difficulties  Cognition Arousal/Alertness: Awake/alert Behavior During Therapy: WFL for tasks assessed/performed Overall Cognitive Status: Within Functional Limits for tasks assessed                      General Comments General comments (skin integrity, edema, etc.): Pt sitting in recliner upon PT arrival.  Nursing cleared pt for participation in physical therapy.  Pt agreeable to PT session.    Exercises     Assessment/Plan    PT Assessment Patient needs continued PT services  PT Problem List Decreased strength;Decreased activity tolerance;Decreased balance;Decreased mobility;Pain          PT Treatment Interventions DME instruction;Gait training;Therapeutic activities;Functional mobility training;Therapeutic exercise;Balance training;Patient/family education    PT Goals (Current goals can be found in the Care Plan section)  Acute Rehab PT Goals Patient Stated Goal: to get stronger PT Goal Formulation: With patient Time For Goal Achievement: 03/28/16 Potential to Achieve Goals: Good    Frequency Min 2X/week   Barriers to discharge Decreased caregiver support      Co-evaluation               End of Session Equipment Utilized During Treatment: Gait belt Activity Tolerance: Patient limited by fatigue Patient left: in chair;with call bell/phone within reach;with chair alarm set Nurse Communication: Mobility status;Precautions         Time: 1345-1435 PT Time Calculation (min) (ACUTE ONLY): 50 min   Charges:   PT Evaluation $PT Eval Low Complexity: 1 Procedure PT Treatments $Therapeutic Activity: 23-37 mins   PT G CodesLeitha Bleak March 24, 2016, 3:04 PM Leitha Bleak, [place]

## 2016-03-14 NOTE — Progress Notes (Signed)
Per Dr. Erlene Quan, since pt had BM this AM, hold suppository at this time.

## 2016-03-14 NOTE — Progress Notes (Addendum)
03/13/2016  Subjective: Patient is s/p exploratory laparotomy earlier today due to increasing pneumoperitoneum on CT scan with new onset of atrial fibrillation and hypotension in the emergency room.  Laparotomy was negative for any possible source of free air.    Over the course of the day, the patient went back into sinus rhythm and has been stable and transferred out of the ICU to floor.  NG has been removed.  Patient reports that his pain is doing ok but hurts when he gets moved or turned.    Vital signs: Temp:  [97.4 F (36.3 C)-99.3 F (37.4 C)] 98.4 F (36.9 C) (11/07 1930) Pulse Rate:  [50-116] 80 (11/07 1930) Resp:  [14-24] 14 (11/07 1930) BP: (81-131)/(54-88) 123/65 (11/07 1930) SpO2:  [95 %-100 %] 97 % (11/07 2030) Arterial Line BP: (56-130)/(50-93) 56/51 (11/07 1230) Weight:  [77.5 kg (170 lb 13.7 oz)] 77.5 kg (170 lb 13.7 oz) (11/07 0615)   Intake/Output: 11/07 0701 - 11/08 0700 In: 1063.3 [I.V.:1013.3; IV Piggyback:50] Out: 250 [Urine:250] Last BM Date: 03/12/16  Physical Exam: Constitutional: No acute distress Cardiac:  Regular rhythm and rate Pulm: no respiratory distress or use of accessory muscles Abdomen:  Soft, non-distended, appropriately tender to palpation.  Incision covered with dressing clean/dry/intact.  Labs:   Recent Labs  03/12/16 2216 03/13/16 0526  WBC 10.9* 9.8  HGB 13.5 11.5*  HCT 38.5* 33.4*  PLT 445* 329    Recent Labs  03/12/16 2216 03/13/16 0526  NA 132* 132*  K 4.0 3.8  CL 95* 103  CO2 23 21*  GLUCOSE 133* 155*  BUN 33* 34*  CREATININE 2.11* 2.09*  CALCIUM 9.3 7.6*   No results for input(s): LABPROT, INR in the last 72 hours.  Imaging: Dg Chest Port 1 View  Result Date: 03/13/2016 CLINICAL DATA:  Status post laparoscopic nephrectomy on November 2nd with onset of abdominal pain resulting in ER visit on November 7th. EXAM: PORTABLE CHEST 1 VIEW COMPARISON:  Portable chest x-ray of February 22, 2016 FINDINGS: The lungs are  well-expanded. There is minimal linear density at the left lung base. There is no pleural effusion or pneumothorax. The cardiac silhouette is top-normal in size. The pulmonary vascularity is not engorged. There is calcification in the wall of the aortic arch. The esophagogastric tube tip in proximal port lie below the GE junction. IMPRESSION: Minimal left basilar atelectasis.  No pneumonia nor CHF. Aortic atherosclerosis. Electronically Signed   By: David  Martinique M.D.   On: 03/13/2016 09:28    Assessment/Plan: 82 yo male s/p exploratory laparotomy, negative for any source of free air.  -Can start sips/ice chips today -Continue IV fluid hydration. -Follow up cardiology recs -- new consult has been placed to evaluate new onset of atrial fibrillation.  Given negative exlap findings, unclear that this could be a result of free air vs new onset which resulted in hypotension and hypoperfusion. -OOB, ambulate, [place], [place]

## 2016-03-14 NOTE — Anesthesia Postprocedure Evaluation (Signed)
Anesthesia Post Note  Patient: Carolann Littler [place]  Procedure(s) Performed: Procedure(s) (LRB): EXPLORATORY LAPAROTOMY (N/A)  Patient location during evaluation: PACU Anesthesia Type: General Level of consciousness: awake and alert Pain management: pain level controlled Vital Signs Assessment: post-procedure vital signs reviewed and stable Respiratory status: spontaneous breathing, nonlabored ventilation, respiratory function stable and patient connected to nasal cannula oxygen Cardiovascular status: blood pressure returned to baseline and stable Postop Assessment: no signs of nausea or vomiting Anesthetic complications: no    Last Vitals:  Vitals:   03/14/16 0827 03/14/16 1145  BP: 139/68 102/66  Pulse: (!) 48 81  Resp: 18 14  Temp:  36.4 C    Last Pain:  Vitals:   03/14/16 1145  TempSrc: Oral  PainSc:                  Precious Haws [place]

## 2016-03-14 NOTE — Progress Notes (Signed)
03/14/2016  Subjective: Patient is 1 Day Post-Op s/p exploratory laparotomy.  Patient started on clear liquids today.  Denies any nausea or worsening pain with the liquids.  Reports flatus and had BM today.  Pain is controlled.  Vital signs: Temp:  [97.6 F (36.4 C)-97.8 F (36.6 C)] 97.7 F (36.5 C) (11/08 1958) Pulse Rate:  [48-81] 56 (11/08 1958) Resp:  [14-18] 18 (11/08 1958) BP: (102-139)/(60-68) 132/61 (11/08 1958) SpO2:  [97 %-100 %] 99 % (11/08 1958)   Intake/Output: 11/07 0701 - 11/08 0700 In: 1929.6 [I.V.:1679.6; IV Piggyback:250] Out: 250 [Urine:250] Last BM Date: 03/14/16  Physical Exam: Constitutional:  No acute distress Cardiac:  Regular rhythm, with low grade bradycardia Pulm:  No respiratory distress Abdomen: soft, mildly distended, appropriately tender to palpation.  Incision with staples, clean/dry/intact.  Labs:   Recent Labs  03/13/16 0526 03/14/16 0831  WBC 9.8 9.2  HGB 11.5* 10.6*  HCT 33.4* 30.4*  PLT 329 293    Recent Labs  03/13/16 0526 03/14/16 0831  NA 132* 134*  K 3.8 3.4*  CL 103 103  CO2 21* 21*  GLUCOSE 155* 84  BUN 34* 34*  CREATININE 2.09* 1.97*  CALCIUM 7.6* 7.5*   No results for input(s): LABPROT, INR in the last 72 hours.  Imaging: No results found.  Assessment/Plan: 82 yo male s/p exploratory laparotomy  -Continue clear liquids.  Possible advance tomorrow based on the patient's bowel function and distention. -OOB, ambulate, PT consult -follow up cardiology recs.   Melvyn Neth, [place]

## 2016-03-14 NOTE — Care Management (Signed)
Patient developed  pneumoperitoneum four days s/p nephrectomy.  Exploratory lap 11/7 which was negative. He developed hypotension and atrial flutter.  Presented from [facility] on 11/4. Prior to that he was resident in the independent living level of care at the retirement community. PT consult is pending and at present would anticipate return to the skilled nursing level of care at [facility]

## 2016-03-14 NOTE — Progress Notes (Signed)
Per urology MD, maintain foley catheter at this time.

## 2016-03-14 NOTE — Progress Notes (Signed)
[place] at [place] NAME: Kendra Krzywicki    MR#:  [id number]  DATE OF BIRTH:  03/01/1926  SUBJECTIVE: medical follow-up. Patient the says that he is doing better. Tolerating the liquid diet.   CHIEF COMPLAINT:   Chief Complaint  Patient presents with  . Abdominal Pain    REVIEW OF SYSTEMS:    Review of Systems  Constitutional: Negative for chills and fever.  HENT: Negative for hearing loss.   Eyes: Negative for blurred vision, double vision and photophobia.  Respiratory: Negative for cough, hemoptysis and shortness of breath.   Cardiovascular: Negative for palpitations, orthopnea and leg swelling.  Gastrointestinal: Negative for abdominal pain, diarrhea and vomiting.  Genitourinary: Negative for dysuria and urgency.  Musculoskeletal: Negative for myalgias and neck pain.  Skin: Negative for rash.  Neurological: Negative for dizziness, focal weakness, seizures, weakness and headaches.  Psychiatric/Behavioral: Negative for memory loss. The patient does not have insomnia.     Nutrition: Tolerating Diet: Tolerating PT:      DRUG ALLERGIES:  No Known Allergies  VITALS:  Blood pressure 102/66, pulse 81, temperature 97.6 F (36.4 C), temperature source Oral, resp. rate 14, height 5\' 9"  (1.753 m), weight 77.5 kg (170 lb 13.7 oz), SpO2 100 %.  PHYSICAL EXAMINATION:   Physical Exam  GENERAL:  82 y.o.-year-old patient lying in the bed with no acute distress.  EYES: Pupils equal, round, reactive to light and accommodation. No scleral icterus. Extraocular muscles intact.  HEENT: Head atraumatic, normocephalic. Oropharynx and nasopharynx clear.  NECK:  Supple, no jugular venous distention. No thyroid enlargement, no tenderness.  LUNGS: Normal breath sounds bilaterally, no wheezing, rales,rhonchi or crepitation. No use of accessory muscles of respiration.  CARDIOVASCULAR: S1, S2 normal. No murmurs, rubs, or gallops.  ABDOMEN:  Soft, nontender, nondistended. Bowel sounds present. No organomegaly or mass.  EXTREMITIES: No pedal edema, cyanosis, or clubbing.  NEUROLOGIC: Cranial nerves II through XII are intact. Muscle strength 5/5 in all extremities. Sensation intact. Gait not checked.  PSYCHIATRIC: The patient is alert and oriented x 3.  SKIN: No obvious rash, lesion, or ulcer.    LABORATORY PANEL:   CBC  Recent Labs Lab 03/14/16 0831  WBC 9.2  HGB 10.6*  HCT 30.4*  PLT 293   ------------------------------------------------------------------------------------------------------------------  Chemistries   Recent Labs Lab 03/12/16 2216  03/14/16 0831  NA 132*  < > 134*  K 4.0  < > 3.4*  CL 95*  < > 103  CO2 23  < > 21*  GLUCOSE 133*  < > 84  BUN 33*  < > 34*  CREATININE 2.11*  < > 1.97*  CALCIUM 9.3  < > 7.5*  AST 40  --   --   ALT 13*  --   --   ALKPHOS 242*  --   --   BILITOT 0.7  --   --   < > = values in this interval not displayed. ------------------------------------------------------------------------------------------------------------------  Cardiac Enzymes  Recent Labs Lab 03/13/16 1902  TROPONINI 0.03*   ------------------------------------------------------------------------------------------------------------------  RADIOLOGY:  Dg Chest Port 1 View  Result Date: 03/13/2016 CLINICAL DATA:  Status post laparoscopic nephrectomy on November 2nd with onset of abdominal pain resulting in ER visit on November 7th. EXAM: PORTABLE CHEST 1 VIEW COMPARISON:  Portable chest x-ray of February 22, 2016 FINDINGS: The lungs are well-expanded. There is minimal linear density at the left lung base. There is no pleural effusion or pneumothorax. The cardiac silhouette is  top-normal in size. The pulmonary vascularity is not engorged. There is calcification in the wall of the aortic arch. The esophagogastric tube tip in proximal port lie below the GE junction. IMPRESSION: Minimal left basilar  atelectasis.  No pneumonia nor CHF. Aortic atherosclerosis. Electronically Signed   By: David  Martinique M.D.   On: 03/13/2016 09:28   Ct Renal Stone Study  Result Date: 03/12/2016 CLINICAL DATA:  Abdominal pain and hypotension. History renal cell carcinoma. Status post left nephrectomy 03/08/2016 EXAM: CT ABDOMEN AND PELVIS WITHOUT CONTRAST TECHNIQUE: Multidetector CT imaging of the abdomen and pelvis was performed following the standard protocol without IV contrast. COMPARISON:  CT abdomen pelvis 02/22/2016 FINDINGS: Lower chest: Small left pleural effusion and associated atelectasis. Trace right pleural effusion. Hepatobiliary: Scattered hepatic calcified granulomata. The gallbladder is distended. Pancreas: Normal pancreatic contours. No peripancreatic fluid collection or pancreatic ductal dilatation. Spleen: The spleen is shrunken with numerous calcified granulomata. Adrenals/Urinary Tract: The left adrenal is enlarged with a post hemorrhagic appearance. The patient is status post left nephrectomy. Small amount of fluid and inflammatory change in the left renal fossa. No right hydronephrosis. Small calcification within the midportion of the right kidney. Right lower pole renal cyst measures up to 3.6 cm. Stomach/Bowel: There is inflammatory stranding surrounding the transverse colon at the splenic flexure. No evidence of small-bowel obstruction. No discrete intra-abdominal fluid collection. There is rectosigmoid diverticulosis without clear evidence of acute diverticulitis. Normal appendix. Vascular/Lymphatic: There is atherosclerotic calcification of the abdominal aorta. Distal abdominal aortic aneurysm is unchanged in size measuring approximately 3.4 cm. No abdominal or pelvic adenopathy. Reproductive: Multiple brachytherapy seeds within the prostate. Seminal vesicles are normal. Musculoskeletal: Severe multilevel lumbar facet arthrosis and osteophytosis. No bony spinal canal stenosis. No focal lytic lesions.  Multifocal subcutaneous emphysema. Other: There is large volume pneumoperitoneum within the anterior abdomen, much greater than expected, even in the context of recent surgery. IMPRESSION: 1. Large volume pneumoperitoneum within the anterior abdomen, much greater than expected following surgery. The appearance is concerning for bowel perforation. Fifth 2. Wall thickening and inflammatory stranding surrounding the distal transverse colon and proximal descending colon near the splenic flexure. 3. Small left adrenal hematoma. 4. Aortic atherosclerosis with infrarenal abdominal aortic aneurysm measuring up to 3.4 cm. 5. Status post left nephrectomy. Multifocal retroperitoneal loculated gas may be postoperative. Critical Value/emergent results were called by telephone at the time of interpretation on 03/12/2016 at 11:51 pm to Dr. Meade Maw , who verbally acknowledged these results. Electronically Signed   By: Ulyses Jarred M.D.   On: 03/12/2016 23:51     ASSESSMENT AND PLAN:   Active Problems:   Pneumoperitoneum   Generalized abdominal pain   Atrial fibrillation (HCC)   Cough   #69.82 year old male patient with the prostate cancer, hypertension, hyperlipidemia, hypothyroidism admitted because of abdominal pain, hypotension, patient recently had laparoscopic nephrectomy on November 2. Patient did have exploratory laparoscopy on seventh because of pneumoperitoneum. Patient takes lactulose unremarkable. No perforation.  #2 hypotension, atrial fibrillation: Improved after hydration, cardiology is following, continue IV fluids, 3 .hypothyroidism #4. Renal failure with the hypotension causing ATN: P improving. Left nephrectomy because of left renal mass. Urology is following. #5 slightly elevated troponins: Cardiology is following the patient. Patient has proximal atrial fibrillation with setting of pneumoperitoneum, currently in sinus rhythm. Defer  AV nodal blocking agents, monitor closely.   All the  records are reviewed and case discussed with Care Management/Social Workerr. Management plans discussed with the patient, family and they are in  agreement.  CODE STATUS: full  TOTAL TIME TAKING CARE OF THIS PATIENT: 35 minutes.   POSSIBLE D/C IN 1-2 DAYS, DEPENDING ON CLINICAL CONDITION.   Epifanio Lesches M.D on 03/14/2016 at 12:33 PM  Between 7am to 6pm - Pager - [phone]  After 6pm go to [email or web address] - password [email or web address]  [place] [facility]  Office  [phone]  CC: Primary care physician; Leonel Ramsay, MD

## 2016-03-14 NOTE — NC FL2 (Signed)
[place] LEVEL OF CARE SCREENING TOOL     IDENTIFICATION  Patient Name: Frank Bartlett Birthdate: August 23, 1925 Sex: male Admission Date (Current Location): 03/12/2016  [place] and [place] Number:  Engineering geologist and Address:  [facility], [address], [place], [place] [address]      Provider Number: [id number]  Attending Physician Name and Address:  Nickie Retort, MD  Relative Name and Phone Number:       Current Level of Care: Hospital Recommended Level of Care: [place] Prior Approval Number:    Date Approved/Denied:   PASRR Number: [id number]  Discharge Plan: SNF    Current Diagnoses: Patient Active Problem List   Diagnosis Date Noted  . Pneumoperitoneum 03/13/2016  . Bowel perforation ([place])   . Generalized abdominal pain   . Atrial fibrillation ([place])   . Cough   . Left renal mass 03/08/2016  . Acute renal failure ([place]) 07/01/2015  . C. difficile colitis 07/01/2015  . Generalized weakness 07/01/2015  . Hypokalemia 07/01/2015  . Elevated transaminase level 07/01/2015  . Leukocytosis 07/01/2015  . Anemia 07/01/2015  . Hyponatremia 06/29/2015    Orientation RESPIRATION BLADDER Height & Weight     Self, Time, Situation, Place  Normal Incontinent Weight: 170 lb 13.7 oz (77.5 kg) Height:  5\' 9"  (175.3 cm)  BEHAVIORAL SYMPTOMS/MOOD NEUROLOGICAL BOWEL NUTRITION STATUS   (none)  (none) Incontinent Diet (clear liquid diet; to advance as tolerated)  AMBULATORY STATUS COMMUNICATION OF NEEDS Skin   Limited Assist Verbally Surgical wounds                       Personal Care Assistance Level of Assistance  Bathing, Dressing Bathing Assistance: Limited assistance Feeding assistance: Independent Dressing Assistance: Limited assistance     Functional Limitations Info             SPECIAL CARE FACTORS FREQUENCY  PT (By licensed PT)                    Contractures  Contractures Info: Not present    Additional Factors Info  Code Status, Allergies Code Status Info: DNR Allergies Info: NKA           Current Medications (03/14/2016):  This is the current hospital active medication list Current Facility-Administered Medications  Medication Dose Route Frequency Provider Last Rate Last Dose  . 0.9 %  sodium chloride infusion   Intravenous Continuous Nickie Retort, MD 100 mL/hr at 03/14/16 [phone]    . acetaminophen (OFIRMEV) IV 1,000 mg  1,000 mg Intravenous Q6H Ardis Hughs, MD   1,000 mg at 03/14/16 0836  . aspirin EC tablet 81 mg  81 mg Oral Daily Festus Aloe, MD   81 mg at 03/14/16 1001  . bisacodyl (DULCOLAX) suppository 10 mg  10 mg Rectal Daily PRN Awilda Bill, NP      . bisacodyl (DULCOLAX) suppository 10 mg  10 mg Rectal Daily Nickie Retort, MD      . enoxaparin (LOVENOX) injection 30 mg  30 mg Subcutaneous Q24H Festus Aloe, MD   30 mg at 03/14/16 0836  . feeding supplement (ENSURE ENLIVE) (ENSURE ENLIVE) liquid 237 mL  237 mL Oral BID PRN Ardis Hughs, MD      . levothyroxine (SYNTHROID, LEVOTHROID) tablet 112 mcg  112 mcg Oral QAC breakfast Festus Aloe, MD   112 mcg at 03/14/16 0836  . morphine 2 MG/ML injection  1-2 mg  1-2 mg Intravenous Q4H PRN Awilda Bill, NP   2 mg at 03/14/16 0831  . ondansetron (ZOFRAN) injection 4 mg  4 mg Intravenous Q6H PRN Awilda Bill, NP      . traMADol Veatrice Bourbon) tablet 50-100 mg  50-100 mg Oral Q6H PRN Ardis Hughs, MD         Discharge Medications: Please see discharge summary for a list of discharge medications.  Relevant Imaging Results:  Relevant Lab Results:   Additional Information    Shela Leff, LCSW

## 2016-03-15 LAB — BASIC METABOLIC PANEL
Anion gap: 6 (ref 5–15)
BUN: 30 mg/dL — AB (ref 6–20)
CALCIUM: 7.4 mg/dL — AB (ref 8.9–10.3)
CO2: 20 mmol/L — AB (ref 22–32)
CREATININE: 1.79 mg/dL — AB (ref 0.61–1.24)
Chloride: 106 mmol/L (ref 101–111)
GFR calc non Af Amer: 32 mL/min — ABNORMAL LOW (ref >60)
GFR, EST AFRICAN AMERICAN: 37 mL/min — AB (ref >60)
Glucose, Bld: 89 mg/dL (ref 65–99)
Potassium: 3.1 mmol/L — ABNORMAL LOW (ref 3.5–5.1)
SODIUM: 132 mmol/L — AB (ref 135–145)

## 2016-03-15 LAB — CBC
HCT: 26.9 % — ABNORMAL LOW (ref 40.0–52.0)
Hemoglobin: 9.4 g/dL — ABNORMAL LOW (ref 13.0–18.0)
MCH: 31.8 pg (ref 26.0–34.0)
MCHC: 34.9 g/dL (ref 32.0–36.0)
MCV: 91 fL (ref 80.0–100.0)
Platelets: 296 10*3/uL (ref 150–440)
RBC: 2.95 MIL/uL — ABNORMAL LOW (ref 4.40–5.90)
RDW: 13.8 % (ref 11.5–14.5)
WBC: 8.2 10*3/uL (ref 3.8–10.6)

## 2016-03-15 LAB — C DIFFICILE QUICK SCREEN W PCR REFLEX
C DIFFICILE (CDIFF) TOXIN: NEGATIVE
C DIFFICLE (CDIFF) ANTIGEN: POSITIVE — AB

## 2016-03-15 LAB — CLOSTRIDIUM DIFFICILE BY PCR: Toxigenic C. Difficile by PCR: POSITIVE — AB

## 2016-03-15 MED ORDER — POTASSIUM CHLORIDE 20 MEQ PO PACK
40.0000 meq | PACK | Freq: Once | ORAL | Status: AC
  Administered 2016-03-15: 40 meq via ORAL
  Filled 2016-03-15: qty 2

## 2016-03-15 MED ORDER — HYDROCODONE-ACETAMINOPHEN 5-325 MG PO TABS: 1.0000 | ORAL_TABLET | ORAL | 0 refills | Status: DC | PRN

## 2016-03-15 MED ORDER — VANCOMYCIN 50 MG/ML ORAL SOLUTION
500.0000 mg | Freq: Four times a day (QID) | ORAL | Status: DC
  Filled 2016-03-15: qty 10

## 2016-03-15 MED ORDER — VANCOMYCIN 50 MG/ML ORAL SOLUTION
125.0000 mg | Freq: Four times a day (QID) | ORAL | Status: DC
  Administered 2016-03-15 – 2016-03-17 (×7): 125 mg via ORAL
  Filled 2016-03-15 (×10): qty 2.5

## 2016-03-15 MED ORDER — VANCOMYCIN 50 MG/ML ORAL SOLUTION: 500.0000 mg | Freq: Four times a day (QID) | ORAL | 0 refills | Status: DC

## 2016-03-15 MED ORDER — KCL IN DEXTROSE-NACL 20-5-0.45 MEQ/L-%-% IV SOLN
INTRAVENOUS | Status: DC
Start: 2016-03-15 — End: 2016-03-16
  Administered 2016-03-15: 11:00:00 via INTRAVENOUS
  Filled 2016-03-15 (×4): qty 1000

## 2016-03-15 NOTE — Progress Notes (Signed)
C. Diff positive. Will start PO vancomycin for 14 days per protocol

## 2016-03-15 NOTE — Progress Notes (Signed)
[place] at [place] NAME: Frank Bartlett    MR#:  [id number]  DATE OF BIRTH:  [birth year]  bp good.cdiff positive.started on po vanco,  CHIEF COMPLAINT:   Chief Complaint  Patient presents with  . Abdominal Pain    REVIEW OF SYSTEMS:    Review of Systems  Constitutional: Negative for chills and fever.  HENT: Negative for hearing loss.   Eyes: Negative for blurred vision, double vision and photophobia.  Respiratory: Negative for cough, hemoptysis and shortness of breath.   Cardiovascular: Negative for palpitations, orthopnea and leg swelling.  Gastrointestinal: Negative for abdominal pain, diarrhea and vomiting.  Genitourinary: Negative for dysuria and urgency.  Musculoskeletal: Negative for myalgias and neck pain.  Skin: Negative for rash.  Neurological: Negative for dizziness, focal weakness, seizures, weakness and headaches.  Psychiatric/Behavioral: Negative for memory loss. The patient does not have insomnia.     Nutrition: Tolerating Diet: Tolerating PT:      DRUG ALLERGIES:  No Known Allergies  VITALS:  Blood pressure (!) 152/78, pulse (!) 51, temperature 98 F (36.7 C), temperature source Oral, resp. rate 18, height 5\' 9"  (1.753 m), weight 77.5 kg (170 lb 13.7 oz), SpO2 98 %.  PHYSICAL EXAMINATION:   Physical Exam  GENERAL:  82 y.o.-year-old patient lying in the bed with no acute distress.  EYES: Pupils equal, round, reactive to light and accommodation. No scleral icterus. Extraocular muscles intact.  HEENT: Head atraumatic, normocephalic. Oropharynx and nasopharynx clear.  NECK:  Supple, no jugular venous distention. No thyroid enlargement, no tenderness.  LUNGS: Normal breath sounds bilaterally, no wheezing, rales,rhonchi or crepitation. No use of accessory muscles of respiration.  CARDIOVASCULAR: S1, S2 normal. No murmurs, rubs, or gallops.  ABDOMEN: Soft, nontender, nondistended. Bowel sounds present.  No organomegaly or mass.  EXTREMITIES: No pedal edema, cyanosis, or clubbing.  NEUROLOGIC: Cranial nerves II through XII are intact. Muscle strength 5/5 in all extremities. Sensation intact. Gait not checked.  PSYCHIATRIC: The patient is alert and oriented x 3.  SKIN: No obvious rash, lesion, or ulcer.    LABORATORY PANEL:   CBC  Recent Labs Lab 03/15/16 0527  WBC 8.2  HGB 9.4*  HCT 26.9*  PLT 296   ------------------------------------------------------------------------------------------------------------------  Chemistries   Recent Labs Lab 03/12/16 2216  03/15/16 0527  NA 132*  < > 132*  K 4.0  < > 3.1*  CL 95*  < > 106  CO2 23  < > 20*  GLUCOSE 133*  < > 89  BUN 33*  < > 30*  CREATININE 2.11*  < > 1.79*  CALCIUM 9.3  < > 7.4*  AST 40  --   --   ALT 13*  --   --   ALKPHOS 242*  --   --   BILITOT 0.7  --   --   < > = values in this interval not displayed. ------------------------------------------------------------------------------------------------------------------  Cardiac Enzymes  Recent Labs Lab 03/13/16 1902  TROPONINI 0.03*   ------------------------------------------------------------------------------------------------------------------  RADIOLOGY:  No results found.   ASSESSMENT AND PLAN:   Active Problems:   Pneumoperitoneum   Generalized abdominal pain   Atrial fibrillation (HCC)   Cough   #55.82 year old male patient with the prostate cancer, hypertension, hyperlipidemia, hypothyroidism admitted because of abdominal pain, hypotension, patient recently had laparoscopic nephrectomy on November 2. Patient did have exploratory laparoscopy on seventh because of pneumoperitoneum.laparosocpy did not show perforation, #2 hypotension, atrial fibrillation: Improved after hydration, cardiology is following, continue  IV fluids, 3 .hypothyroidism #4. Renal failure with the hypotension causing ATN: P improving. Left nephrectomy because of left renal  mass. Urology is following. #5 slightly elevated troponins: Cardiology is following the patient. Patient has proximal atrial fibrillation with setting of pneumoperitoneum, currently in sinus rhythm. Defer  AV nodal blocking agents, monitor closely. 6.hypokalemia;replace k 7.cdif colitis;on po vancomycin;recommmned 14 days All the records are reviewed and case discussed with Care Management/Social Workerr. Management plans discussed with the patient, family and they are in agreement.  CODE STATUS: full  TOTAL TIME TAKING CARE OF THIS PATIENT: 35 minutes.   POSSIBLE D/C IN 1-2 DAYS, DEPENDING ON CLINICAL CONDITION.   Epifanio Lesches M.D on 03/15/2016 at 10:10 PM  Between 7am to 6pm - Pager - [phone]  After 6pm go to [email or web address] - password [email or web address] [facility]  [place] [facility]  Office  [phone]  CC: Primary care physician; Leonel Ramsay, MD

## 2016-03-15 NOTE — Progress Notes (Signed)
03/15/2016  Subjective: 82 yr old POD#2 exploratory laparotomy for pneumoperiteum after urologic procedure but negative on exam.  Patient states some soreness in abdomen and weakness but doing well.  He tolerated liquids without difficulty and worked with PT.   Vital signs: Temp:  [97.7 F (36.5 C)-98.1 F (36.7 C)] 98.1 F (36.7 C) (11/09 0614) Pulse Rate:  [49-56] 50 (11/09 1134) Resp:  [18] 18 (11/09 0614) BP: (129-142)/(61-70) 142/70 (11/09 1134) SpO2:  [96 %-99 %] 96 % (11/09 1134)   Intake/Output: 11/08 0701 - 11/09 0700 In: 2043.3 [I.V.:1943.3; IV Piggyback:100] Out: 500 [Urine:500] Last BM Date: 03/15/16  Physical Exam: Constitutional:  No acute distress Cardiac:  Regular rhythm, with low grade bradycardia Pulm:  No respiratory distress Abdomen: soft, mildly distended, appropriately tender to palpation.  Incision with staples, clean/dry/intact.  Labs:   Recent Labs  03/14/16 0831 03/15/16 0527  WBC 9.2 8.2  HGB 10.6* 9.4*  HCT 30.4* 26.9*  PLT 293 296    Recent Labs  03/14/16 0831 03/15/16 0527  NA 134* 132*  K 3.4* 3.1*  CL 103 106  CO2 21* 20*  GLUCOSE 84 89  BUN 34* 30*  CREATININE 1.97* 1.79*  CALCIUM 7.5* 7.4*   No results for input(s): LABPROT, INR in the last 72 hours.  Imaging: No results found.  Assessment/Plan: 82 yo male POD#2 from neg exploratory laparotomy   -advance diet as tolerates, now with C. Diff on appropriate meds, general surgery will sign off but please call [phone] with any questions or concerns

## 2016-03-15 NOTE — Progress Notes (Signed)
No events overnight Patient complains of continuous diarrhea. Concerned about c. diff which he had earlier this year No n/v Tolerating clears UOP slighly improved, but Cr trending down well +flatus Worked with PT yesterday  Vitals:   03/14/16 0827 03/14/16 1145 03/14/16 1958 03/15/16 0614  BP: 139/68 102/66 132/61 129/67  Pulse: (!) 48 81 (!) 56 (!) 49  Resp: 18 14 18 18   Temp:  97.6 F (36.4 C) 97.7 F (36.5 C) 98.1 F (36.7 C)  TempSrc:  Oral Oral Oral  SpO2: 99% 100% 99% 97%  Weight:      Height:       I/O last 3 completed shifts: In: 2909.6 [I.V.:2609.6; IV Piggyback:300] Out: 500 [Urine:500] No intake/output data recorded.  NAD Soft approp T mild D Inc c/d/i Foley clear  CBC    Component Value Date/Time   WBC 8.2 03/15/2016 0527   RBC 2.95 (L) 03/15/2016 0527   HGB 9.4 (L) 03/15/2016 0527   HCT 26.9 (L) 03/15/2016 0527   PLT 296 03/15/2016 0527   MCV 91.0 03/15/2016 0527   MCH 31.8 03/15/2016 0527   MCHC 34.9 03/15/2016 0527   RDW 13.8 03/15/2016 0527   LYMPHSABS 0.5 (L) 03/12/2016 2216   MONOABS 0.6 03/12/2016 2216   EOSABS 0.1 03/12/2016 2216   BASOSABS 0.1 03/12/2016 2216   BMP Latest Ref Rng & Units 03/15/2016 03/14/2016 03/13/2016  Glucose 65 - 99 mg/dL 89 84 155(H)  BUN 6 - 20 mg/dL 30(H) 34(H) 34(H)  Creatinine 0.61 - 1.24 mg/dL 1.79(H) 1.97(H) 2.09(H)  Sodium 135 - 145 mmol/L 132(L) 134(L) 132(L)  Potassium 3.5 - 5.1 mmol/L 3.1(L) 3.4(L) 3.8  Chloride 101 - 111 mmol/L 106 103 103  CO2 22 - 32 mmol/L 20(L) 21(L) 21(L)  Calcium 8.9 - 10.3 mg/dL 7.4(L) 7.5(L) 7.6(L)     POD 7/2 lap hand assist L nx/negative ex lap. Progressing as expected -will check c. diff due to continuous diarrhea  -adv to fulls -daily labs -continue PT -d/c foley

## 2016-03-15 NOTE — Progress Notes (Signed)
Dr. Pilar Jarvis notified that patient's c.diff PCR came back positive. MD to place orders.

## 2016-03-15 NOTE — Progress Notes (Signed)
[facility]  SUBJECTIVE: I don't have chest pain   Vitals:   03/14/16 0827 03/14/16 1145 03/14/16 1958 03/15/16 0614  BP: 139/68 102/66 132/61 129/67  Pulse: (!) 48 81 (!) 56 (!) 49  Resp: 18 14 18 18   Temp:  97.6 F (36.4 C) 97.7 F (36.5 C) 98.1 F (36.7 C)  TempSrc:  Oral Oral Oral  SpO2: 99% 100% 99% 97%  Weight:      Height:         Intake/Output Summary (Last 24 hours) at 03/15/16 [id number] Last data filed at 03/15/16 0400  Gross per 24 hour  Intake          2043.33 ml  Output              500 ml  Net          1543.33 ml      PHYSICAL EXAM  General: Well developed, well nourished, in no acute distress HEENT:  Normocephalic and atramatic Neck:  No JVD.  Lungs: Clear bilaterally to auscultation and percussion. Heart: HRRR . Normal S1 and S2 without gallops or murmurs.  Abdomen: Bowel sounds are positive, abdomen soft and non-tender  Msk:  Back normal, normal gait. Normal strength and tone for age. Extremities: No clubbing, cyanosis or edema.   Neuro: Alert and oriented X 3. Psych:  Good affect, responds appropriately   LABS: Basic Metabolic Panel:  Recent Labs  03/14/16 0831 03/15/16 0527  NA 134* 132*  K 3.4* 3.1*  CL 103 106  CO2 21* 20*  GLUCOSE 84 89  BUN 34* 30*  CREATININE 1.97* 1.79*  CALCIUM 7.5* 7.4*   Liver Function Tests:  Recent Labs  03/12/16 2216  AST 40  ALT 13*  ALKPHOS 242*  BILITOT 0.7  PROT 7.5  ALBUMIN 3.2*    Recent Labs  03/12/16 2216  LIPASE 39   CBC:  Recent Labs  03/12/16 2216  03/14/16 0831 03/15/16 0527  WBC 10.9*  < > 9.2 8.2  NEUTROABS 9.7*  --   --   --   HGB 13.5  < > 10.6* 9.4*  HCT 38.5*  < > 30.4* 26.9*  MCV 91.8  < > 91.7 91.0  PLT 445*  < > 293 296  < > = values in this interval not displayed. Cardiac Enzymes:  Recent Labs  03/13/16 0526 03/13/16 1233 03/13/16 1902  TROPONINI <0.03 0.04* 0.03*   BNP: Invalid input(s): POCBNP D-Dimer: No results for input(s): DDIMER in the last  72 hours. Hemoglobin A1C: No results for input(s): HGBA1C in the last 72 hours. Fasting Lipid Panel: No results for input(s): CHOL, HDL, LDLCALC, TRIG, CHOLHDL, LDLDIRECT in the last 72 hours. Thyroid Function Tests: No results for input(s): TSH, T4TOTAL, T3FREE, THYROIDAB in the last 72 hours.  Invalid input(s): FREET3 Anemia Panel: No results for input(s): VITAMINB12, FOLATE, FERRITIN, TIBC, IRON, RETICCTPCT in the last 72 hours.  No results found.   Echo   TELEMETRY: Paroxysmal atrial fibrillation:  ASSESSMENT AND PLAN:  Active Problems:   Pneumoperitoneum   Generalized abdominal pain   Atrial fibrillation (HCC)   Cough    1. Paroxysmal atrial fibrillation, alternating with sinus bradycardia with frequent PACs, hemodynamically stable 2. Pneumoperitoneum, status post nephrectomy, stabilized after exploratory laparotomy  Recommendations  1. Continue current therapy 2. Defer AV blocking agents since patient has a slime sinus bradycardia without problematic atrial fibrillation/atrial flutter    Isaias Cowman, MD, PhD, [facility] 03/15/2016 8:38 AM

## 2016-03-16 LAB — BASIC METABOLIC PANEL
ANION GAP: 7 (ref 5–15)
BUN: 23 mg/dL — ABNORMAL HIGH (ref 6–20)
CALCIUM: 7.5 mg/dL — AB (ref 8.9–10.3)
CO2: 19 mmol/L — AB (ref 22–32)
CREATININE: 1.39 mg/dL — AB (ref 0.61–1.24)
Chloride: 105 mmol/L (ref 101–111)
GFR, EST AFRICAN AMERICAN: 51 mL/min — AB (ref >60)
GFR, EST NON AFRICAN AMERICAN: 44 mL/min — AB (ref >60)
GLUCOSE: 104 mg/dL — AB (ref 65–99)
Potassium: 3.8 mmol/L (ref 3.5–5.1)
Sodium: 131 mmol/L — ABNORMAL LOW (ref 135–145)

## 2016-03-16 LAB — CBC
HEMATOCRIT: 27.3 % — AB (ref 40.0–52.0)
Hemoglobin: 9.3 g/dL — ABNORMAL LOW (ref 13.0–18.0)
MCH: 31.6 pg (ref 26.0–34.0)
MCHC: 34.2 g/dL (ref 32.0–36.0)
MCV: 92.4 fL (ref 80.0–100.0)
PLATELETS: 295 10*3/uL (ref 150–440)
RBC: 2.96 MIL/uL — ABNORMAL LOW (ref 4.40–5.90)
RDW: 13.3 % (ref 11.5–14.5)
WBC: 6.7 10*3/uL (ref 3.8–10.6)

## 2016-03-16 MED ORDER — SODIUM CHLORIDE 0.9 % IV SOLN
INTRAVENOUS | Status: AC
  Administered 2016-03-16 – 2016-03-17 (×2): via INTRAVENOUS

## 2016-03-16 NOTE — Clinical Social Work Note (Signed)
Patient is from [facility] receiving therapy.  Patient would like to return back to SNF so he can continue his therapy and get back to [facility] independent living.  MSW to continue to follow patient's progress throughout discharge planning.  Jones Broom. Norval Morton, MSW [phone]  Mon-Fri 8a-4:30p 03/16/2016 3:57 PM

## 2016-03-16 NOTE — Progress Notes (Signed)
[name] [place] NAME: Frank Bartlett    MR#:  [id number]  DATE OF BIRTH:  [birth year]   Patient has diarrhea. No abdominal pain. Poor by mouth intake.   CHIEF COMPLAINT:   Chief Complaint  Patient presents with  . Abdominal Pain    REVIEW OF SYSTEMS:    Review of Systems  Constitutional: Negative for chills and fever.  HENT: Negative for hearing loss.   Eyes: Negative for blurred vision, double vision and photophobia.  Respiratory: Negative for cough, hemoptysis and shortness of breath.   Cardiovascular: Negative for palpitations, orthopnea and leg swelling.  Gastrointestinal: Positive for diarrhea. Negative for abdominal pain and vomiting.  Genitourinary: Negative for dysuria and urgency.  Musculoskeletal: Negative for myalgias and neck pain.  Skin: Negative for rash.  Neurological: Negative for dizziness, focal weakness, seizures, weakness and headaches.  Psychiatric/Behavioral: Negative for memory loss. The patient does not have insomnia.     Nutrition: Tolerating Diet: Tolerating PT:      DRUG ALLERGIES:  No Known Allergies  VITALS:  Blood pressure (!) 154/78, pulse (!) 51, temperature 98.6 F (37 C), temperature source Oral, resp. rate 18, height 5\' 9"  (1.753 m), weight 77.5 kg (170 lb 13.7 oz), SpO2 97 %.  PHYSICAL EXAMINATION:   Physical Exam  GENERAL:  82 y.o.-year-old patient lying in the bed with no acute distress.  EYES: Pupils equal, round, reactive to light and accommodation. No scleral icterus. Extraocular muscles intact.  HEENT: Head atraumatic, normocephalic. Oropharynx and nasopharynx clear.  NECK:  Supple, no jugular venous distention. No thyroid enlargement, no tenderness.  LUNGS: Normal breath sounds bilaterally, no wheezing, rales,rhonchi or crepitation. No use of accessory muscles of respiration.  CARDIOVASCULAR: S1, S2 normal. No murmurs, rubs, or gallops.  ABDOMEN: Soft, nontender,  nondistended. Bowel sounds present. No organomegaly or mass.  EXTREMITIES: No pedal edema, cyanosis, or clubbing.  NEUROLOGIC: Cranial nerves II through XII are intact. Muscle strength 5/5 in all extremities. Sensation intact. Gait not checked.  PSYCHIATRIC: The patient is alert and oriented x 3.  SKIN: No obvious rash, lesion, or ulcer.    LABORATORY PANEL:   CBC  Recent Labs Lab 03/16/16 0553  WBC 6.7  HGB 9.3*  HCT 27.3*  PLT 295   ------------------------------------------------------------------------------------------------------------------  Chemistries   Recent Labs Lab 03/12/16 2216  03/16/16 0553  NA 132*  < > 131*  K 4.0  < > 3.8  CL 95*  < > 105  CO2 23  < > 19*  GLUCOSE 133*  < > 104*  BUN 33*  < > 23*  CREATININE 2.11*  < > 1.39*  CALCIUM 9.3  < > 7.5*  AST 40  --   --   ALT 13*  --   --   ALKPHOS 242*  --   --   BILITOT 0.7  --   --   < > = values in this interval not displayed. ------------------------------------------------------------------------------------------------------------------  Cardiac Enzymes  Recent Labs Lab 03/13/16 1902  TROPONINI 0.03*   ------------------------------------------------------------------------------------------------------------------  RADIOLOGY:  No results found.   ASSESSMENT AND PLAN:   Active Problems:   Pneumoperitoneum   Generalized abdominal pain   Atrial fibrillation (HCC)   Cough   #57.82 year old male patient with the prostate cancer, hypertension, hyperlipidemia, hypothyroidism admitted because of abdominal pain, hypotension, patient recently had laparoscopic nephrectomy on November 2. Patient did have exploratory laparoscopy on seventh because of pneumoperitoneum.laparosocpy did not show perforation, #2 hypotension, atrial  fibrillation: Improved after hydration, cardiology is following, continue IV fluids, 3 .hypothyroidism #4. Acute Renal failure with the hypotension causing ATN: P  improving. Left nephrectomy because of left renal mass. Urology is following. #5 slightly elevated troponins: Cardiology is following the patient. Patient has proximal atrial fibrillation with setting of pneumoperitoneum, currently in sinus rhythm. Defer  AV nodal blocking agents, monitor closely. 6.hypokalemia;replaced. Improved,. 7.cdif colitis;on po vancomycin;recommmned 14 days.add probiotics.  All the records are reviewed and case discussed with Care Management/Social Workerr. Management plans discussed with the patient, family and they are in agreement.  CODE STATUS: full  TOTAL TIME TAKING CARE OF THIS PATIENT: 35 minutes.   POSSIBLE D/C IN 1-2 DAYS, DEPENDING ON CLINICAL CONDITION.   Epifanio Lesches M.D on 03/16/2016 at 9:36 AM  Between 7am to 6pm - Pager - [phone]  After 6pm go to [email or web address] - password [email or web address] [facility]  [facility]  Office  [phone]  CC: Primary care physician; Leonel Ramsay, MD

## 2016-03-16 NOTE — Progress Notes (Signed)
03/16/16  Subjective: Continues to have multiple loose stools. Being treated for C. difficile, on by mouth vancomycin. Some but minimal ambulation. Poor appetite.  No nausea or vomiting.  Voiding.    Vitals:   03/15/16 1937 03/16/16 0525 03/16/16 1200 03/16/16 1230  BP: (!) 152/78 (!) 154/78 (!) 156/65   Pulse: (!) 51 (!) 51 (!) 52 67  Resp: 18 18 12    Temp: 98 F (36.7 C) 98.6 F (37 C) 98.3 F (36.8 C)   TempSrc: Oral Oral Oral   SpO2: 98% 97% 97%   Weight:      Height:       I/O last 3 completed shifts: In: 1876.3 [I.V.:1876.3] Out: 1325 [Urine:1325] Total I/O In: -  Out: 500 [Urine:500]  NAD Soft approp T mild D Inc c/d/i  CBC    Component Value Date/Time   WBC 6.7 03/16/2016 0553   RBC 2.96 (L) 03/16/2016 0553   HGB 9.3 (L) 03/16/2016 0553   HCT 27.3 (L) 03/16/2016 0553   PLT 295 03/16/2016 0553   MCV 92.4 03/16/2016 0553   MCH 31.6 03/16/2016 0553   MCHC 34.2 03/16/2016 0553   RDW 13.3 03/16/2016 0553   LYMPHSABS 0.5 (L) 03/12/2016 2216   MONOABS 0.6 03/12/2016 2216   EOSABS 0.1 03/12/2016 2216   BASOSABS 0.1 03/12/2016 2216   BMP Latest Ref Rng & Units 03/16/2016 03/15/2016 03/14/2016  Glucose 65 - 99 mg/dL 104(H) 89 84  BUN 6 - 20 mg/dL 23(H) 30(H) 34(H)  Creatinine 0.61 - 1.24 mg/dL 1.39(H) 1.79(H) 1.97(H)  Sodium 135 - 145 mmol/L 131(L) 132(L) 134(L)  Potassium 3.5 - 5.1 mmol/L 3.8 3.1(L) 3.4(L)  Chloride 101 - 111 mmol/L 105 106 103  CO2 22 - 32 mmol/L 19(L) 20(L) 21(L)  Calcium 8.9 - 10.3 mg/dL 7.5(L) 7.4(L) 7.5(L)    A/P: POD 8/23 lap hand assist L nx/negative ex lap. Creatinine improving. New diagnosis C. Difficile. -advance to regular diet -ambulate with PT -med/ cardiology following -likely discharge in 1-2 days   Hollice Espy, MD

## 2016-03-16 NOTE — Progress Notes (Signed)
[facility] Cardiology  SUBJECTIVE: I have some shortness of breath   Vitals:   03/15/16 0614 03/15/16 1134 03/15/16 1937 03/16/16 0525  BP: 129/67 (!) 142/70 (!) 152/78 (!) 154/78  Pulse: (!) 49 (!) 50 (!) 51 (!) 51  Resp: 18  18 18   Temp: 98.1 F (36.7 C)  98 F (36.7 C) 98.6 F (37 C)  TempSrc: Oral  Oral Oral  SpO2: 97% 96% 98% 97%  Weight:      Height:         Intake/Output Summary (Last 24 hours) at 03/16/16 0744 Last data filed at 03/16/16 [id number]  Gross per 24 hour  Intake           576.25 ml  Output              825 ml  Net          -248.75 ml      PHYSICAL EXAM  General: Well developed, well nourished, in no acute distress HEENT:  Normocephalic and atramatic Neck:  No JVD.  Lungs: Clear bilaterally to auscultation and percussion. Heart: HRRR . Normal S1 and S2 without gallops or murmurs.  Abdomen: Bowel sounds are positive, abdomen soft and non-tender  Msk:  Back normal, normal gait. Normal strength and tone for age. Extremities: No clubbing, cyanosis or edema.   Neuro: Alert and oriented X 3. Psych:  Good affect, responds appropriately   LABS: Basic Metabolic Panel:  Recent Labs  03/15/16 0527 03/16/16 0553  NA 132* 131*  K 3.1* 3.8  CL 106 105  CO2 20* 19*  GLUCOSE 89 104*  BUN 30* 23*  CREATININE 1.79* 1.39*  CALCIUM 7.4* 7.5*   Liver Function Tests: No results for input(s): AST, ALT, ALKPHOS, BILITOT, PROT, ALBUMIN in the last 72 hours. No results for input(s): LIPASE, AMYLASE in the last 72 hours. CBC:  Recent Labs  03/15/16 0527 03/16/16 0553  WBC 8.2 6.7  HGB 9.4* 9.3*  HCT 26.9* 27.3*  MCV 91.0 92.4  PLT 296 295   Cardiac Enzymes:  Recent Labs  03/13/16 1233 03/13/16 1902  TROPONINI 0.04* 0.03*   BNP: Invalid input(s): POCBNP D-Dimer: No results for input(s): DDIMER in the last 72 hours. Hemoglobin A1C: No results for input(s): HGBA1C in the last 72 hours. Fasting Lipid Panel: No results for input(s): CHOL, HDL, LDLCALC,  TRIG, CHOLHDL, LDLDIRECT in the last 72 hours. Thyroid Function Tests: No results for input(s): TSH, T4TOTAL, T3FREE, THYROIDAB in the last 72 hours.  Invalid input(s): FREET3 Anemia Panel: No results for input(s): VITAMINB12, FOLATE, FERRITIN, TIBC, IRON, RETICCTPCT in the last 72 hours.  No results found.   Echo   TELEMETRY: Paroxysmal atrial fibrillation:  ASSESSMENT AND PLAN:  Active Problems:   Pneumoperitoneum   Generalized abdominal pain   Atrial fibrillation (HCC)   Cough    1. Paroxysmal atrial fibrillation, alternating with sinus bradycardia, asymptomatic 2. Pneumoperitoneum, status post nephrectomy, status post exploratory laparotomy  Recommendations  1. Continue current medications 2. Defer adding AV blocking agents since patient has baseline sinus bradycardia without problematic atrial fibrillation/atrial flutter  Sign off for now, please call if any questions   Isaias Cowman, MD, PhD, [facility] 03/16/2016 7:44 AM

## 2016-03-16 NOTE — Progress Notes (Signed)
Physical Therapy Treatment Patient Details Name: Frank Bartlett MRN: [id number] DOB: 1926-04-29 Today's Date: 03/16/2016    History of Present Illness Pt is an 82 y.o. male presenting to hospital with c/o constipation and abdominal pain.  Pt found to have paroxysmal aflutter and hypotension.  Imaging showing large pneumoperitoneum.  Pt s/p exploratory lap 03/13/16.  Pt found to be (+) c-diff during hospital stay.  Of note, pt recently discharged from hospital after surgery for laparoscopic radical nephrectomy secondary to renal mass 03/08/16.  PMH includes bradycardia, htn, prostate CA, c-diff; per PT note 03/10/16 pt with L>R end range resting nystagmus B.    PT Comments    Pt appearing very fatigued laying in bed upon PT arrival (compared to last PT session).  Pt able to sit on edge of bed to perform ex's with SBA (pt c/o minimal R anterior lateral thigh pain with rest beginning and end of session and up to 5/10 pain with muscle activity: nursing notified).  Pt requiring pacing and rest breaks d/t fatigue.  Pt declined standing d/t concerns of R thigh pain increasing with use.  Will continue to progress pt with functional mobility and strengthening per pt tolerance.   Follow Up Recommendations  SNF     Equipment Recommendations  Rolling walker with 5" wheels    Recommendations for Other Services       Precautions / Restrictions Precautions Precautions: Fall Precaution Comments: long abdominal incision Restrictions Weight Bearing Restrictions: No    Mobility  Bed Mobility Overal bed mobility: Needs Assistance Bed Mobility: Supine to Sit;Sit to Supine     Supine to sit: Mod assist;HOB elevated Sit to supine: Mod assist;HOB elevated   General bed mobility comments: assist for trunk and LE's; vc's for logrolling technique required  Transfers Overall transfer level: Needs assistance   Transfers: Lateral/Scoot Transfers Sit to Stand:  (deferred d/t pt c/o  weakness/fatigue)        Lateral/Scoot Transfers: Min guard (pt able to scoot to R on bed with CGA and increased effort)    Ambulation/Gait             General Gait Details: deferred d/t pt c/o weakness/fatigue   Stairs            Wheelchair Mobility    Modified Rankin (Stroke Patients Only)       Balance Overall balance assessment: Needs assistance Sitting-balance support: Bilateral upper extremity supported;Feet supported Sitting balance-Leahy Scale: Fair                              Cognition Arousal/Alertness: Awake/alert Behavior During Therapy: WFL for tasks assessed/performed Overall Cognitive Status: Within Functional Limits for tasks assessed                      Exercises General Exercises - Lower Extremity Long Arc Quad: AROM;Strengthening;Both;Seated Hip Flexion/Marching: AROM;Strengthening;Both;Seated Toe Raises: AROM;Strengthening;Both;Seated Heel Raises: AROM;Strengthening;Both;Seated Shoulder Exercises Shoulder Flexion: AROM;Strengthening;Both;Seated (to 90 degrees shoulder flexion) Elbow Flexion: AROM;Strengthening;Right;Seated (deferred L d/t IV site)  B shoulder elevation/depression AROM sitting. All ex's 10 reps x2 AROM with pacing/rest breaks.    General Comments General comments (skin integrity, edema, etc.): Pt laying in bed upon PT arrival (NT reporting just cleaning pt up).  Pt agreeable to PT session.      Pertinent Vitals/Pain Pain Assessment: 0-10 Pain Score: 4  Pain Location: abdomen; R anterior/lateral thigh with movement Pain Descriptors / Indicators: Sore Pain  Intervention(s): Limited activity within patient's tolerance;Monitored during session;Repositioned    Home Living                      Prior Function            PT Goals (current goals can now be found in the care plan section) Acute Rehab PT Goals Patient Stated Goal: to get stronger PT Goal Formulation: With patient Time  For Goal Achievement: 03/28/16 Potential to Achieve Goals: Fair Progress towards PT goals: Progressing toward goals (with LE strengthening)    Frequency    Min 2X/week      PT Plan Current plan remains appropriate    Co-evaluation             End of Session   Activity Tolerance: Patient limited by fatigue Patient left: in bed;with call bell/phone within reach;with bed alarm set (B heels elevated via pillow)     Time: [id number] PT Time Calculation (min) (ACUTE ONLY): 25 min  Charges:  $Therapeutic Exercise: 8-22 mins $Therapeutic Activity: 8-22 mins                    G CodesLeitha Bleak 04-01-16, 1:33 PM Leitha Bleak, [place]

## 2016-03-16 NOTE — Care Management (Signed)
Informed during progression that there is a diet order present for advancing diet but "patient is refusing to receiving anything other than full liquids.  Discussed the need to advance diet as ordered as this will prolong patient's hospitalization

## 2016-03-16 NOTE — Clinical Social Work Note (Signed)
Clinical Social Work Assessment  Patient Details  Name: Frank Bartlett MRN: [id number] Date of Birth: 1925/11/11  Date of referral:  03/14/16               Reason for consult:  Facility Placement                Permission sought to share information with:  Family Supports Permission granted to share information::  Yes, Verbal Permission Granted  Name::     Brando, [place] [phone]  [phone] or Jackob, [place] [phone]  [phone] or Orlin Hilding [id number]  [phone]   Agency::  SNF admissions  Relationship::     Contact Information:     Housing/Transportation Living arrangements for the past 2 months:  [place] of Information:  Patient Patient Interpreter Needed:  None Criminal Activity/Legal Involvement Pertinent to Current Situation/Hospitalization:  No - Comment as needed Significant Relationships:  Adult Children Lives with:  Facility Resident Do you feel safe going back to the place where you live?  Yes Need for family participation in patient care:  No (Coment)  Care giving concerns:  Patient expressed he does not have any issues with returning back to SNF.   Social Worker assessment / plan:  Patient is an 82 year old male who is alert and oriented x4.  Patient is able to express his feelings and states he has been at [facility] for short term rehab, but is from [facility].  Patient states he was just admitted to [facility] for rehab, and then had to be brought back to the hospital.  MSW explained role and process for updating [facility] for rehab.  Patient states he is familiar with the process and is looking forward to returning back to SNF so he can continue with his therapy.  Patient did not have any other questions or concerns.  Employment status:  Retired Nurse, adult PT Recommendations:  [place] / Referral to community  resources:  [place]  Patient/Family's Response to care:  Patient in agreement to returning back to SNF.  Patient/Family's Understanding of and Emotional Response to Diagnosis, Current Treatment, and Prognosis:  Patient expressed frustration with having to return back to hospital when he just arrived at [facility] for rehab.  Patient is positive and motivated to get his therapy so he can return back home.  Emotional Assessment Appearance:  Appears stated age Attitude/Demeanor/Rapport:    Affect (typically observed):  Appropriate, Calm Orientation:  Oriented to Self, Oriented to Place, Oriented to  Time, Oriented to Situation Alcohol / Substance use:  Not Applicable Psych involvement (Current and /or in the community):  No (Comment)  Discharge Needs  Concerns to be addressed:  No discharge needs identified Readmission within the last 30 days:  No Current discharge risk:  None Barriers to Discharge:  No Barriers Identified   Ross Ludwig 03/16/2016, 3:50 PM

## 2016-03-17 MED ORDER — VANCOMYCIN 50 MG/ML ORAL SOLUTION
250.0000 mg | Freq: Four times a day (QID) | ORAL | Status: DC
  Administered 2016-03-17: 250 mg via ORAL
  Filled 2016-03-17 (×2): qty 5

## 2016-03-17 NOTE — Progress Notes (Signed)
Pt to be discharged to [facility] /rehab this afternoon. Iv and tele removed. Report called to facility. Pt to transport via e.m.s.

## 2016-03-17 NOTE — Progress Notes (Signed)
[place] at [place] NAME: Svanik Geiser    MR#:  [id number]  DATE OF BIRTH:  Jul 06, 1925  SUBJECTIVE:   still with loose stools however improving  REVIEW OF SYSTEMS:    Review of Systems  Constitutional: Negative.  Negative for chills, fever and malaise/fatigue.  HENT: Negative.  Negative for ear discharge, ear pain, hearing loss, nosebleeds and sore throat.   Eyes: Negative.  Negative for blurred vision and pain.  Respiratory: Negative.  Negative for cough, hemoptysis, shortness of breath and wheezing.   Cardiovascular: Negative.  Negative for chest pain, palpitations and leg swelling.  Gastrointestinal: Positive for abdominal pain (from incisions) and diarrhea. Negative for blood in stool, nausea and vomiting.  Genitourinary: Negative.  Negative for dysuria.  Musculoskeletal: Negative.  Negative for back pain.  Skin: Negative.   Neurological: Negative for dizziness, tremors, speech change, focal weakness, seizures and headaches.  Endo/Heme/Allergies: Negative.  Does not bruise/bleed easily.  Psychiatric/Behavioral: Negative.  Negative for depression, hallucinations and suicidal ideas.    Tolerating Diet: yes      DRUG ALLERGIES:  No Known Allergies  VITALS:  Blood pressure (!) 142/71, pulse (!) 45, temperature 98.5 F (36.9 C), temperature source Oral, resp. rate 16, height 5\' 9"  (1.753 m), weight 77.5 kg (170 lb 13.7 oz), SpO2 93 %.  PHYSICAL EXAMINATION:   Physical Exam    LABORATORY PANEL:   CBC  Recent Labs Lab 03/16/16 0553  WBC 6.7  HGB 9.3*  HCT 27.3*  PLT 295   ------------------------------------------------------------------------------------------------------------------  Chemistries   Recent Labs Lab 03/12/16 2216  03/16/16 0553  NA 132*  < > 131*  K 4.0  < > 3.8  CL 95*  < > 105  CO2 23  < > 19*  GLUCOSE 133*  < > 104*  BUN 33*  < > 23*  CREATININE 2.11*  < > 1.39*  CALCIUM 9.3  < >  7.5*  AST 40  --   --   ALT 13*  --   --   ALKPHOS 242*  --   --   BILITOT 0.7  --   --   < > = values in this interval not displayed. ------------------------------------------------------------------------------------------------------------------  Cardiac Enzymes  Recent Labs Lab 03/13/16 0526 03/13/16 1233 03/13/16 1902  TROPONINI <0.03 0.04* 0.03*   ------------------------------------------------------------------------------------------------------------------  RADIOLOGY:  No results found.   ASSESSMENT AND PLAN:   82 yr old with hx of prostate cancer, HTN POD#2 exploratory laparotomy for pneumoperiteum after urologic procedure   1. C diff diarrhea: Continue VANCOMYCIN orally for a total of 2 weeks. AVOID antidiarrhea medications.   2. AKI with ATN from hypotension: Improved  3. PAF withbrady: Continue as per cardiology RECS. Not on medications due to low HR  4.Left nephrectomy: management per UROLOGY   PT RECS SNF at discharge   Will sign off, please call with questions Management plans discussed with the patient and he is in agreement.  CODE STATUS: dnr  TOTAL TIME TAKING CARE OF THIS PATIENT: 28 minutes.     POSSIBLE D/C 1-2 days, DEPENDING ON CLINICAL CONDITION.   Katiya Fike M.D on 03/17/2016 at 9:54 AM  Between 7am to 6pm - Pager - [phone] After 6pm go to [email or web address] - password [email or web address] [place] [facility]  Office  [phone]  CC: Primary care physician; FITZGERALD, DAVID Mamie Nick, MD  Note: This dictation was prepared with Dragon dictation along with smaller phrase technology. Any transcriptional errors that result  from this process are unintentional.

## 2016-03-17 NOTE — Discharge Summary (Signed)
Date of admission: 03/12/2016  Date of discharge: 03/17/2016  Admission diagnosis: Left renal cell carcinoma, abdominal pain  Discharge diagnosis: Left renal cell carcinoma, C. Diff colitis  Secondary diagnoses:  Patient Active Problem List   Diagnosis Date Noted  . Pneumoperitoneum 03/13/2016  . Bowel perforation ([place])   . Generalized abdominal pain   . Atrial fibrillation ([place])   . Cough   . Left renal mass 03/08/2016  . Acute renal failure ([place]) 07/01/2015  . C. difficile colitis 07/01/2015  . Generalized weakness 07/01/2015  . Hypokalemia 07/01/2015  . Elevated transaminase level 07/01/2015  . Leukocytosis 07/01/2015  . Anemia 07/01/2015  . Hyponatremia 06/29/2015    History and Physical: For full details, please see admission history and physical. Briefly, Frank Bartlett is a 82 y.o. year old patient s/p left hand assist nephrectomy who was readmitted with abdominal pain and hypotension. CT showed pneumoperitoneum.  He was taken for an ex lap on the advice of general surgery which was negative for bowel injury. He was admitted for post operative care.   Hospital Course: Patient tolerated the procedure well.  He was then transferred to the floor after an uneventful PACU stay.  He was diagnosed with c. Diff colitis and started on PO vancomycin.  On POD#9/4 he had met discharge criteria: was eating a regular diet, was up and ambulating independently,  pain was well controlled, was voiding without a catheter, and was ready to for discharge. He will finish his course of PO vancomycin for a total of 10 days.   Laboratory values:   Recent Labs  03/15/16 0527 03/16/16 0553  WBC 8.2 6.7  HGB 9.4* 9.3*  HCT 26.9* 27.3*    Recent Labs  03/15/16 0527 03/16/16 0553  NA 132* 131*  K 3.1* 3.8  CL 106 105  CO2 20* 19*  GLUCOSE 89 104*  BUN 30* 23*  CREATININE 1.79* 1.39*  CALCIUM 7.4* 7.5*   No results for input(s): LABPT, INR in the last 72 hours. No results for  input(s): LABURIN in the last 72 hours. Results for orders placed or performed during the hospital encounter of 03/12/16  MRSA PCR Screening     Status: None   Collection Time: 03/13/16  6:45 AM  Result Value Ref Range Status   MRSA by PCR NEGATIVE NEGATIVE Final    Comment:        The GeneXpert MRSA Assay (FDA approved for NASAL specimens only), is one component of a comprehensive MRSA colonization surveillance program. It is not intended to diagnose MRSA infection nor to guide or monitor treatment for MRSA infections.   C difficile quick scan w PCR reflex     Status: Abnormal   Collection Time: 03/15/16 11:09 AM  Result Value Ref Range Status   C Diff antigen POSITIVE (A) NEGATIVE Final   C Diff toxin NEGATIVE NEGATIVE Final   C Diff interpretation Results are indeterminate. See PCR results.  Final  Clostridium Difficile by PCR     Status: Abnormal   Collection Time: 03/15/16 11:09 AM  Result Value Ref Range Status   Toxigenic C Difficile by pcr POSITIVE (A) NEGATIVE Final    Comment: Positive for toxigenic C. difficile with little to no toxin production. Only treat if clinical presentation suggests symptomatic illness.    Disposition: Home  Discharge instruction: The patient was instructed to be ambulatory but told to refrain from heavy lifting, strenuous activity, or driving.   Discharge medications:   Medication List  TAKE these medications   aspirin EC 81 MG tablet Take 81 mg by mouth daily.   feeding supplement (ENSURE ENLIVE) Liqd Take 237 mLs by mouth 2 (two) times daily with a meal. What changed:  when to take this  reasons to take this   HYDROcodone-acetaminophen 5-325 MG tablet Commonly known as:  NORCO/VICODIN Take 1-2 tablets by mouth every 4 (four) hours as needed for moderate pain.   levothyroxine 112 MCG tablet Commonly known as:  SYNTHROID, LEVOTHROID Take 112 mcg by mouth daily before breakfast.   lisinopril-hydrochlorothiazide 20-12.5 MG  tablet Commonly known as:  PRINZIDE,ZESTORETIC take 1 tablet by mouth once daily   meloxicam 15 MG tablet Commonly known as:  MOBIC Take 7.5 mg by mouth.   MULTI-VITAMINS Tabs Take by mouth.   RA VITAMIN B-12 TR 1000 MCG Tbcr Generic drug:  Cyanocobalamin Take 1,000 mcg by mouth daily.   senna-docusate 8.6-50 MG tablet Commonly known as:  Senokot-S Take 1 tablet by mouth 2 (two) times daily. While taking strong pain medication to prevent constipation   vancomycin 50 mg/mL oral solution Commonly known as:  VANCOCIN Take 10 mLs (500 mg total) by mouth every 6 (six) hours.       Followup:  Follow-up [place], MD Follow up in 2 week(s).   Specialty:  Urology Contact information: [address] [place] [address] [phone]

## 2016-03-22 DIAGNOSIS — I495 Sick sinus syndrome: Secondary | ICD-10-CM | POA: Diagnosis not present

## 2016-03-22 DIAGNOSIS — I10 Essential (primary) hypertension: Secondary | ICD-10-CM

## 2016-03-22 DIAGNOSIS — C649 Malignant neoplasm of unspecified kidney, except renal pelvis: Secondary | ICD-10-CM | POA: Diagnosis not present

## 2016-03-22 DIAGNOSIS — A0471 Enterocolitis due to Clostridium difficile, recurrent: Secondary | ICD-10-CM | POA: Diagnosis not present

## 2016-03-22 DIAGNOSIS — K668 Other specified disorders of peritoneum: Secondary | ICD-10-CM | POA: Diagnosis not present

## 2016-03-23 ENCOUNTER — Ambulatory Visit: Payer: Medicare Other

## 2016-03-26 ENCOUNTER — Ambulatory Visit (INDEPENDENT_AMBULATORY_CARE_PROVIDER_SITE_OTHER): Payer: Medicare Other | Admitting: Urology

## 2016-03-26 VITALS — BP 148/81 | HR 58 | Ht 69.0 in | Wt 165.0 lb

## 2016-03-26 DIAGNOSIS — Z85528 Personal history of other malignant neoplasm of kidney: Secondary | ICD-10-CM | POA: Insufficient documentation

## 2016-03-26 DIAGNOSIS — C642 Malignant neoplasm of left kidney, except renal pelvis: Secondary | ICD-10-CM | POA: Insufficient documentation

## 2016-03-26 NOTE — Progress Notes (Signed)
03/26/2016 1:59 PM   Frank Bartlett 09/14/1925 [id number]  Referring provider: Leonel Ramsay, MD [place],  [id number]  Chief Complaint  Patient presents with  . Follow-up    HPI: Frank Bartlett is a 82yo here for followup after left radical nephrectomy on 03/08/2016. His post op course was complicated by C diff colitis and he is currently on PO Vanc. He presented to the ER over 1 week ago with worsening abdominal pain and was thought to have a bowel perforation. He is status post negative exploratory laparotomy. He notes mild pain at the incision. His diarrhea has improved. He is currently eating probiotic yogurt. 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 B12 deficiency   . Bradycardia   . Hyperlipidemia   . Hypertension   . Hypothyroidism   . Osteoarthritis   . Prostate cancer ([place]) 2001   Rad tx's + seed implants    Surgical History: Past Surgical History:  Procedure Laterality Date  . CATARACT EXTRACTION, BILATERAL    . COLONOSCOPY    . EXCISIONAL HEMORRHOIDECTOMY    . INSERTION PROSTATE RADIATION SEED    . LAPAROSCOPIC NEPHRECTOMY, HAND ASSISTED Left 03/08/2016   Procedure: HAND ASSISTED LAPAROSCOPIC NEPHRECTOMY;  Surgeon: Nickie Retort, MD;  Location: [facility] ORS;  Service: Urology;  Laterality: Left;  . LAPAROTOMY N/A 03/13/2016   Procedure: EXPLORATORY LAPAROTOMY;  Surgeon: Festus Aloe, MD;  Location: [facility] ORS;  Service: Urology;  Laterality: N/A;    Home Medications:    Medication List       Accurate as of 03/26/16  1:59 PM. Always use your most recent med list.          aspirin EC 81 MG tablet Take 81 mg by mouth daily.   feeding supplement (ENSURE ENLIVE) Liqd Take 237 mLs by mouth 2 (two) times daily with a meal.   HYDROcodone-acetaminophen 5-325 MG tablet Commonly  known as:  NORCO/VICODIN Take 1-2 tablets by mouth every 4 (four) hours as needed for moderate pain.   levothyroxine 112 MCG tablet Commonly known as:  SYNTHROID, LEVOTHROID Take 112 mcg by mouth daily before breakfast.   lisinopril-hydrochlorothiazide 20-12.5 MG tablet Commonly known as:  PRINZIDE,ZESTORETIC take 1 tablet by mouth once daily   meloxicam 15 MG tablet Commonly known as:  MOBIC Take 7.5 mg by mouth.   MULTI-VITAMINS Tabs Take by mouth.   RA VITAMIN B-12 TR 1000 MCG Tbcr Generic drug:  Cyanocobalamin Take 1,000 mcg by mouth daily.   senna-docusate 8.6-50 MG tablet Commonly known as:  Senokot-S Take 1 tablet by mouth 2 (two) times daily. While taking strong pain medication to prevent constipation   vancomycin 50 mg/mL oral solution Commonly known as:  VANCOCIN Take 10 mLs (500 mg total) by mouth every 6 (six) hours.       Allergies: No Known Allergies  Family History: Family History  Problem Relation Age of Onset  . Hypertension Mother   . Breast cancer Mother   . Prostate cancer Neg Hx     Social History:  reports that he has never smoked. He has never used smokeless tobacco. He reports that he does not drink alcohol or use drugs.  ROS: UROLOGY Frequent Urination?: No Hard to postpone urination?: No Burning/pain with urination?: No Get up at night to urinate?: No Leakage of urine?: No Urine stream  starts and stops?: No Trouble starting stream?: No Do you have to strain to urinate?: No Blood in urine?: No Urinary tract infection?: No Sexually transmitted disease?: No Injury to kidneys or bladder?: No Painful intercourse?: No Weak stream?: No Erection problems?: No Penile pain?: No  Gastrointestinal Nausea?: No Vomiting?: No Indigestion/heartburn?: No Diarrhea?: No Constipation?: No  Constitutional Fever: No Night sweats?: No Weight loss?: No Fatigue?: No  Skin Skin rash/lesions?: No Itching?: No  Eyes Blurred vision?:  No Double vision?: No  Ears/Nose/Throat Sore throat?: No Sinus problems?: No  Hematologic/Lymphatic Swollen glands?: No Easy bruising?: No  Cardiovascular Leg swelling?: No Chest pain?: No  Respiratory Cough?: No Shortness of breath?: No  Endocrine Excessive thirst?: No  Musculoskeletal Back pain?: No Joint pain?: No  Neurological Headaches?: No Dizziness?: No  Psychologic Depression?: No Anxiety?: No  Physical Exam: BP (!) 148/81   Pulse (!) 58   Ht 5\' 9"  (1.753 m)   Wt 74.8 kg (165 lb)   BMI 24.37 kg/m   Constitutional:  Alert and oriented, No acute distress. HEENT: [place] AT, moist mucus membranes.  Trachea midline, no masses. Cardiovascular: No clubbing, cyanosis, or edema. Respiratory: Normal respiratory effort, no increased work of breathing. GI: Abdomen is soft, nontender, nondistended, no abdominal masses GU: No CVA tenderness.  Skin: No rashes, bruises or suspicious lesions. Lymph: No cervical or inguinal adenopathy. Neurologic: Grossly intact, no focal deficits, moving all 4 extremities. Psychiatric: Normal mood and affect.  Laboratory Data: Lab Results  Component Value Date   WBC 6.7 03/16/2016   HGB 9.3 (L) 03/16/2016   HCT 27.3 (L) 03/16/2016   MCV 92.4 03/16/2016   PLT 295 03/16/2016    Lab Results  Component Value Date   CREATININE 1.39 (H) 03/16/2016    No results found for: PSA  No results found for: TESTOSTERONE  No results found for: HGBA1C  Urinalysis    Component Value Date/Time   COLORURINE AMBER (A) 02/22/2016 1147   APPEARANCEUR HAZY (A) 02/22/2016 1147   LABSPEC 1.019 02/22/2016 1147   PHURINE 5.0 02/22/2016 1147   GLUCOSEU NEGATIVE 02/22/2016 1147   HGBUR 3+ (A) 02/22/2016 1147   BILIRUBINUR NEGATIVE 02/22/2016 1147   KETONESUR 1+ (A) 02/22/2016 1147   PROTEINUR 100 (A) 02/22/2016 1147   NITRITE NEGATIVE 02/22/2016 1147   LEUKOCYTESUR NEGATIVE 02/22/2016 1147    Pertinent Imaging: none  Assessment &  Plan:    1. Left renal RCC T3a -RTC 2-3 weeks with BMP  There are no diagnoses linked to this encounter.  No Follow-up on file.  Nicolette Bang, MD  [facility] [address] [place], [place] [address] [phone]

## 2016-03-26 NOTE — Progress Notes (Signed)
Staple Removal  Per Dr. Alyson Ingles patient's staples were removed with out difficulty. 19 staples were removed and benzoine and steri-strips were applied.

## 2016-04-10 ENCOUNTER — Encounter: Payer: Self-pay | Admitting: Urology

## 2016-04-10 ENCOUNTER — Ambulatory Visit: Payer: Medicare Other | Admitting: Urology

## 2016-04-10 VITALS — BP 118/75 | HR 57 | Ht 69.0 in | Wt 161.5 lb

## 2016-04-10 DIAGNOSIS — C642 Malignant neoplasm of left kidney, except renal pelvis: Secondary | ICD-10-CM

## 2016-04-10 NOTE — Progress Notes (Signed)
04/10/2016 2:38 PM   Frank Bartlett Jun 25, 1925 [id number]  Referring provider: Leonel Ramsay, MD [place], [place] [address]  Chief Complaint  Patient presents with  . Follow-up    Renal cancer     HPI: Frank Bartlett is a 82yo here for followup for T3a RCC and c Diff. His post op course was complicated by c diff and had a negative exploratory laparotomy. Since his last saw me 2 weeks ago he continues to feel fatigued. Diarrhea resolved. He is not taking a probiotic. He denies any abdominal pain. He notes his mobility is better.      PMH: Past Medical History:  Diagnosis Date  . B12 deficiency   . Bradycardia   . Hyperlipidemia   . Hypertension   . Hypothyroidism   . Osteoarthritis   . Prostate cancer ([place]) 2001   Rad tx's + seed implants    Surgical History: Past Surgical History:  Procedure Laterality Date  . CATARACT EXTRACTION, BILATERAL    . COLONOSCOPY    . EXCISIONAL HEMORRHOIDECTOMY    . INSERTION PROSTATE RADIATION SEED    . LAPAROSCOPIC NEPHRECTOMY, HAND ASSISTED Left 03/08/2016   Procedure: HAND ASSISTED LAPAROSCOPIC NEPHRECTOMY;  Surgeon: Nickie Retort, MD;  Location: [facility] ORS;  Service: Urology;  Laterality: Left;  . LAPAROTOMY N/A 03/13/2016   Procedure: EXPLORATORY LAPAROTOMY;  Surgeon: Festus Aloe, MD;  Location: [facility] ORS;  Service: Urology;  Laterality: N/A;    Home Medications:    Medication List       Accurate as of 04/10/16  2:38 PM. Always use your most recent med list.          aspirin EC 81 MG tablet Take 81 mg by mouth daily.   feeding supplement (ENSURE ENLIVE) Liqd Take 237 mLs by mouth 2 (two) times daily with a meal.   levothyroxine 112 MCG tablet Commonly known as:  SYNTHROID, LEVOTHROID Take 112 mcg by mouth daily before breakfast.   lisinopril-hydrochlorothiazide 20-12.5 MG tablet Commonly known as:  PRINZIDE,ZESTORETIC take 1 tablet by mouth  once daily   meloxicam 15 MG tablet Commonly known as:  MOBIC Take 7.5 mg by mouth.   MULTI-VITAMINS Tabs Take by mouth.   RA VITAMIN B-12 TR 1000 MCG Tbcr Generic drug:  Cyanocobalamin Take 1,000 mcg by mouth daily.   senna-docusate 8.6-50 MG tablet Commonly known as:  Senokot-S Take 1 tablet by mouth 2 (two) times daily. While taking strong pain medication to prevent constipation       Allergies: No Known Allergies  Family History: Family History  Problem Relation Age of Onset  . Hypertension Mother   . Breast cancer Mother   . Prostate cancer Neg Hx     Social History:  reports that he has never smoked. He has never used smokeless tobacco. He reports that he does not drink alcohol or use drugs.  ROS: UROLOGY Frequent Urination?: No Hard to postpone urination?: No Burning/pain with urination?: No Get up at night to urinate?: No Leakage of urine?: No Urine stream starts and stops?: No Trouble starting stream?: No Do you have to strain to urinate?: No Blood in urine?: No Urinary tract infection?: No Sexually transmitted disease?: No Injury to kidneys or bladder?: No Painful intercourse?: No Weak stream?: No Erection problems?: No Penile pain?: No  Gastrointestinal Nausea?: No Vomiting?: No Indigestion/heartburn?: No Diarrhea?: No Constipation?: No  Constitutional Fever: No Night sweats?: No Weight loss?: No Fatigue?: No  Skin Skin rash/lesions?: No Itching?: No  Eyes Blurred vision?: No Double vision?: No  Ears/Nose/Throat Sore throat?: No Sinus problems?: No  Hematologic/Lymphatic Swollen glands?: No Easy bruising?: No  Cardiovascular Leg swelling?: No Chest pain?: No  Respiratory Cough?: No Shortness of breath?: No  Endocrine Excessive thirst?: No  Musculoskeletal Back pain?: No Joint pain?: No  Neurological Headaches?: No Dizziness?: No  Psychologic Depression?: No Anxiety?: No  Physical Exam: BP 118/75    Pulse (!) 57   Ht 5\' 9"  (1.753 m)   Wt 73.3 kg (161 lb 8 oz)   BMI 23.85 kg/m   Constitutional:  Alert and oriented, No acute distress. HEENT: [place] AT, moist mucus membranes.  Trachea midline, no masses. Cardiovascular: No clubbing, cyanosis, or edema. Respiratory: Normal respiratory effort, no increased work of breathing. GI: Abdomen is soft, nontender, nondistended, no abdominal masses GU: No CVA tenderness.  Skin: No rashes, bruises or suspicious lesions. Lymph: No cervical or inguinal adenopathy. Neurologic: Grossly intact, no focal deficits, moving all 4 extremities. Psychiatric: Normal mood and affect.  Laboratory Data: Lab Results  Component Value Date   WBC 6.7 03/16/2016   HGB 9.3 (L) 03/16/2016   HCT 27.3 (L) 03/16/2016   MCV 92.4 03/16/2016   PLT 295 03/16/2016    Lab Results  Component Value Date   CREATININE 1.39 (H) 03/16/2016    No results found for: PSA  No results found for: TESTOSTERONE  No results found for: HGBA1C  Urinalysis    Component Value Date/Time   COLORURINE AMBER (A) 02/22/2016 1147   APPEARANCEUR HAZY (A) 02/22/2016 1147   LABSPEC 1.019 02/22/2016 1147   PHURINE 5.0 02/22/2016 1147   GLUCOSEU NEGATIVE 02/22/2016 1147   HGBUR 3+ (A) 02/22/2016 1147   BILIRUBINUR NEGATIVE 02/22/2016 1147   KETONESUR 1+ (A) 02/22/2016 1147   PROTEINUR 100 (A) 02/22/2016 1147   NITRITE NEGATIVE 02/22/2016 1147   LEUKOCYTESUR NEGATIVE 02/22/2016 1147    Pertinent Imaging: none  Assessment & Plan:    1. Renal cell cancer, left (HCC) -BMP today, will call with results -RTC 3 months with CXR and CMP - Basic metabolic panel   No Follow-up on file.  Nicolette Bang, MD  [facility] [address], [place], [place] [address] [phone]

## 2016-04-11 LAB — BASIC METABOLIC PANEL
BUN / CREAT RATIO: 20 (ref 10–24)
BUN: 36 mg/dL — ABNORMAL HIGH (ref 8–27)
CO2: 23 mmol/L (ref 18–29)
CREATININE: 1.76 mg/dL — AB (ref 0.76–1.27)
Calcium: 9.3 mg/dL (ref 8.6–10.2)
Chloride: 97 mmol/L (ref 96–106)
GFR calc Af Amer: 39 mL/min/{1.73_m2} — ABNORMAL LOW (ref >59)
GFR, EST NON AFRICAN AMERICAN: 34 mL/min/{1.73_m2} — AB (ref >59)
Glucose: 118 mg/dL — ABNORMAL HIGH (ref 65–99)
Potassium: 5.9 mmol/L — ABNORMAL HIGH (ref 3.5–5.2)
SODIUM: 136 mmol/L (ref 134–144)

## 2016-05-16 DIAGNOSIS — I4892 Unspecified atrial flutter: Secondary | ICD-10-CM | POA: Insufficient documentation

## 2016-05-16 DIAGNOSIS — I480 Paroxysmal atrial fibrillation: Secondary | ICD-10-CM | POA: Insufficient documentation

## 2016-06-01 DIAGNOSIS — Z905 Acquired absence of kidney: Secondary | ICD-10-CM | POA: Insufficient documentation

## 2016-06-01 DIAGNOSIS — N183 Chronic kidney disease, stage 3 (moderate): Secondary | ICD-10-CM

## 2016-06-01 DIAGNOSIS — Z8619 Personal history of other infectious and parasitic diseases: Secondary | ICD-10-CM | POA: Insufficient documentation

## 2016-06-01 DIAGNOSIS — N1831 Chronic kidney disease, stage 3a: Secondary | ICD-10-CM | POA: Insufficient documentation

## 2016-07-09 ENCOUNTER — Ambulatory Visit
Admission: RE | Admit: 2016-07-09 | Discharge: 2016-07-09 | Disposition: A | Discharge status: Home / Self Care | Payer: Medicare Other | Source: Ambulatory Visit | Attending: Urology | Admitting: Urology

## 2016-07-09 ENCOUNTER — Other Ambulatory Visit: Payer: Medicare Other

## 2016-07-09 DIAGNOSIS — C642 Malignant neoplasm of left kidney, except renal pelvis: Secondary | ICD-10-CM | POA: Diagnosis not present

## 2016-07-09 DIAGNOSIS — I700 Atherosclerosis of aorta: Secondary | ICD-10-CM | POA: Insufficient documentation

## 2016-07-10 LAB — COMPREHENSIVE METABOLIC PANEL
ALT: 10 IU/L (ref 0–44)
AST: 19 IU/L (ref 0–40)
Albumin/Globulin Ratio: 1.7 (ref 1.2–2.2)
Albumin: 4.3 g/dL (ref 3.5–4.7)
Alkaline Phosphatase: 78 IU/L (ref 39–117)
BUN/Creatinine Ratio: 15 (ref 10–24)
BUN: 21 mg/dL (ref 8–27)
CHLORIDE: 96 mmol/L (ref 96–106)
CO2: 25 mmol/L (ref 18–29)
Calcium: 9.4 mg/dL (ref 8.6–10.2)
Creatinine, Ser: 1.42 mg/dL — ABNORMAL HIGH (ref 0.76–1.27)
GFR calc Af Amer: 51 mL/min/{1.73_m2} — ABNORMAL LOW (ref >59)
GFR calc non Af Amer: 44 mL/min/{1.73_m2} — ABNORMAL LOW (ref >59)
GLUCOSE: 107 mg/dL — AB (ref 65–99)
Globulin, Total: 2.5 g/dL (ref 1.5–4.5)
POTASSIUM: 5 mmol/L (ref 3.5–5.2)
Sodium: 136 mmol/L (ref 134–144)
Total Protein: 6.8 g/dL (ref 6.0–8.5)

## 2016-07-17 ENCOUNTER — Encounter: Payer: Self-pay | Admitting: Urology

## 2016-07-17 ENCOUNTER — Ambulatory Visit: Payer: Medicare Other | Admitting: Urology

## 2016-07-17 VITALS — BP 170/76 | HR 50 | Ht 69.0 in | Wt 166.7 lb

## 2016-07-17 DIAGNOSIS — C642 Malignant neoplasm of left kidney, except renal pelvis: Secondary | ICD-10-CM

## 2016-07-17 NOTE — Progress Notes (Signed)
07/17/2016 11:43 AM   Frank Bartlett 1925/05/10 [id number]  Referring provider: Leonel Ramsay, MD Frank Bart[place], Frank Bart[place] [id number]  Chief Complaint  Patient presents with  . Follow-up    Renal cancer     HPI: 83 year old male who was seen today for follow-up of his renal cell carcinoma. He underwent a laparoscopic hand-assisted left radical nephrectomy. His postoperative course was complicated by C. difficile. In addition, the patient also underwent exploratory laparotomy a week postop after he presented with hypotension and A. fib. This was a negative exploration.  11/17:  Dr. Pilar Bartlett - T3a-renal cell carcinoma, chromophobe type. Tumor was invading into the renal vein as well as the collecting system. Margins were negative. 3/18: Chest x-ray negative for metastatic disease.  Interval: The patient presents today for routine follow-up 3 months status post left radical nephrectomy. The patient has recovered quite well. He is moving his bowels normal, appetite is good, weight is stable. He has had no incisional trouble.   PMH: Past Medical History:  Diagnosis Date  . B12 deficiency   . Bradycardia   . Hyperlipidemia   . Hypertension   . Hypothyroidism   . Osteoarthritis   . Prostate cancer ([place]) 2001   Rad tx's + seed implants    Surgical History: Past Surgical History:  Procedure Laterality Date  . CATARACT EXTRACTION, BILATERAL    . COLONOSCOPY    . EXCISIONAL HEMORRHOIDECTOMY    . INSERTION PROSTATE RADIATION SEED    . LAPAROSCOPIC NEPHRECTOMY, HAND ASSISTED Left 03/08/2016   Procedure: HAND ASSISTED LAPAROSCOPIC NEPHRECTOMY;  Surgeon: Nickie Retort, MD;  Location: [facility] ORS;  Service: Urology;  Laterality: Left;  . LAPAROTOMY N/A 03/13/2016   Procedure: EXPLORATORY LAPAROTOMY;  Surgeon: Festus Aloe, MD;  Location: [facility] ORS;  Service: Urology;  Laterality: N/A;    Home Medications:  Allergies as of 07/17/2016   No Known Allergies       Medication List       Accurate as of 07/17/16 11:43 AM. Always use your most recent med list.          aspirin EC 81 MG tablet Take 81 mg by mouth daily.   feeding supplement (ENSURE ENLIVE) Liqd Take 237 mLs by mouth 2 (two) times daily with a meal.   levothyroxine 112 MCG tablet Commonly known as:  SYNTHROID, LEVOTHROID Take 112 mcg by mouth daily before breakfast.   lisinopril-hydrochlorothiazide 20-12.5 MG tablet Commonly known as:  PRINZIDE,ZESTORETIC take 1 tablet by mouth once daily   MULTI-VITAMINS Tabs Take by mouth.   RA VITAMIN B-12 TR 1000 MCG Tbcr Generic drug:  Cyanocobalamin Take 1,000 mcg by mouth daily.       Allergies: No Known Allergies  Family History: Family History  Problem Relation Age of Onset  . Hypertension Mother   . Breast cancer Mother   . Prostate cancer Neg Hx     Social History:  reports that he has never smoked. He has never used smokeless tobacco. He reports that he does not drink alcohol or use drugs.  ROS: UROLOGY Frequent Urination?: No Hard to postpone urination?: No Burning/pain with urination?: No Get up at night to urinate?: No Leakage of urine?: No Urine stream starts and stops?: No Trouble starting stream?: No Do you have to strain to urinate?: No Blood in urine?: No Urinary tract infection?: No Sexually transmitted disease?: No Injury to kidneys or bladder?: No Painful intercourse?: No Weak stream?: No Erection problems?: No Penile pain?: No  Gastrointestinal Nausea?: No Vomiting?: No Indigestion/heartburn?: No Diarrhea?: No Constipation?: No  Constitutional Fever: No Night sweats?: No Weight loss?: No Fatigue?: No  Skin Skin rash/lesions?: No Itching?: No  Eyes Blurred vision?: No Double vision?: No  Ears/Nose/Throat Sore throat?: No Sinus problems?: No  Hematologic/Lymphatic Swollen glands?: No Easy bruising?: No  Cardiovascular Leg swelling?: No Chest pain?:  No  Respiratory Cough?: No Shortness of breath?: No  Endocrine Excessive thirst?: No  Musculoskeletal Back pain?: No Joint pain?: No  Neurological Headaches?: No Dizziness?: No  Psychologic Depression?: No Anxiety?: No  Physical Exam: BP (!) 170/76   Pulse (!) 50   Ht 5\' 9"  (1.753 m)   Wt 75.6 kg (166 lb 11.2 oz)   BMI 24.62 kg/m   Constitutional:  Alert and oriented, No acute distress. Abdomen is soft, nontender. He has a small incisional hernia in the midline at the very superior portion of his incision which is approximately 1 cm in size. It is reducible.  Laboratory Data: Lab Results  Component Value Date   WBC 6.7 03/16/2016   HGB 9.3 (L) 03/16/2016   HCT 27.3 (L) 03/16/2016   MCV 92.4 03/16/2016   PLT 295 03/16/2016    Lab Results  Component Value Date   CREATININE 1.42 (H) 07/09/2016    No results found for: PSA  No results found for: TESTOSTERONE  No results found for: HGBA1C  Urinalysis    Component Value Date/Time   COLORURINE AMBER (A) 02/22/2016 1147   APPEARANCEUR HAZY (A) 02/22/2016 1147   LABSPEC 1.019 02/22/2016 1147   PHURINE 5.0 02/22/2016 1147   [place] 02/22/2016 1147   HGBUR 3+ (A) 02/22/2016 1147   BILIRUBINUR NEGATIVE 02/22/2016 1147   KETONESUR 1+ (A) 02/22/2016 1147   PROTEINUR 100 (A) 02/22/2016 1147   NITRITE NEGATIVE 02/22/2016 [place] 02/22/2016 1147    Pertinent Imaging: The patient underwent a chest x-ray prior to his appointment, I independently reviewed these images, they demonstrate no evidence of pulmonary metastasis or metastatic disease.  Assessment & Plan:  T3a renal cell carcinoma-chromophobe type, margins negative. The patient is doing very well. He does have a small ventral hernia but is asymptomatic. His labs have been reviewed with the patient. He is not on a potassium supplement, but I did caution him about eating too much potassium given his current level. His renal  function is where we would expect it to be, slightly elevated. The remaining aspects of his labs are all reassuring. We will plan for the patient to return again in 3 months with labs prior as well as a CT abdomen with and without contrast. At that point, the patient can then be on a 6 month interval follow-up with labs and imaging prior. I did request the patient to be scheduled with Dr. Pilar Bartlett, his primary urologist.  There are no diagnoses linked to this encounter.  No Follow-up on file.  Ardis Hughs, [facility] [address] [place], [place] [address] [phone]

## 2016-10-10 ENCOUNTER — Other Ambulatory Visit: Payer: Medicare Other

## 2016-10-10 DIAGNOSIS — C649 Malignant neoplasm of unspecified kidney, except renal pelvis: Secondary | ICD-10-CM

## 2016-10-11 LAB — COMPREHENSIVE METABOLIC PANEL
ALBUMIN: 4.5 g/dL (ref 3.5–4.7)
ALT: 12 IU/L (ref 0–44)
AST: 19 IU/L (ref 0–40)
Albumin/Globulin Ratio: 1.7 (ref 1.2–2.2)
Alkaline Phosphatase: 89 IU/L (ref 39–117)
BUN / CREAT RATIO: 14 (ref 10–24)
BUN: 20 mg/dL (ref 8–27)
Bilirubin Total: 0.3 mg/dL (ref 0.0–1.2)
CALCIUM: 9.8 mg/dL (ref 8.6–10.2)
CHLORIDE: 95 mmol/L — AB (ref 96–106)
CO2: 25 mmol/L (ref 18–29)
CREATININE: 1.43 mg/dL — AB (ref 0.76–1.27)
GFR, EST AFRICAN AMERICAN: 50 mL/min/{1.73_m2} — AB (ref >59)
GFR, EST NON AFRICAN AMERICAN: 43 mL/min/{1.73_m2} — AB (ref >59)
GLUCOSE: 96 mg/dL (ref 65–99)
Globulin, Total: 2.7 g/dL (ref 1.5–4.5)
Potassium: 5.3 mmol/L — ABNORMAL HIGH (ref 3.5–5.2)
Sodium: 134 mmol/L (ref 134–144)
TOTAL PROTEIN: 7.2 g/dL (ref 6.0–8.5)

## 2016-10-15 ENCOUNTER — Ambulatory Visit
Admission: RE | Admit: 2016-10-15 | Discharge: 2016-10-15 | Disposition: A | Discharge status: Home / Self Care | Payer: Medicare Other | Source: Ambulatory Visit | Attending: Urology | Admitting: Urology

## 2016-10-15 DIAGNOSIS — Z905 Acquired absence of kidney: Secondary | ICD-10-CM | POA: Insufficient documentation

## 2016-10-15 DIAGNOSIS — N200 Calculus of kidney: Secondary | ICD-10-CM | POA: Diagnosis not present

## 2016-10-15 DIAGNOSIS — C642 Malignant neoplasm of left kidney, except renal pelvis: Secondary | ICD-10-CM | POA: Diagnosis not present

## 2016-10-15 DIAGNOSIS — N281 Cyst of kidney, acquired: Secondary | ICD-10-CM | POA: Diagnosis not present

## 2016-10-15 HISTORY — DX: Malignant neoplasm of left kidney, except renal pelvis: C64.2

## 2016-10-15 MED ORDER — IOPAMIDOL (ISOVUE-370) INJECTION 76%
75.0000 mL | Freq: Once | INTRAVENOUS | Status: AC | PRN
Start: 2016-10-15 — End: 2016-10-15
  Administered 2016-10-15: 75 mL via INTRAVENOUS

## 2016-10-17 ENCOUNTER — Ambulatory Visit (INDEPENDENT_AMBULATORY_CARE_PROVIDER_SITE_OTHER): Payer: Medicare Other | Admitting: Urology

## 2016-10-17 ENCOUNTER — Encounter: Payer: Self-pay | Admitting: Urology

## 2016-10-17 VITALS — BP 180/90 | HR 47 | Ht 69.0 in | Wt 166.1 lb

## 2016-10-17 DIAGNOSIS — C642 Malignant neoplasm of left kidney, except renal pelvis: Secondary | ICD-10-CM | POA: Diagnosis not present

## 2016-10-17 NOTE — Progress Notes (Signed)
10/17/2016 10:21 AM   Frank Bartlett 25-Dec-1925 [id number]  Referring provider: Leonel Ramsay, MD [place], [place] [address]  Chief Complaint  Patient presents with  . Results    CT  . Follow-up    Renal CA    HPI: 83 year old male who was seen today for follow-up of his renal cell carcinoma. He underwent a laparoscopic hand-assisted left radical nephrectomy. His postoperative course was complicated by C. difficile. In addition, the patient also underwent exploratory laparotomy a week postop after he presented with hypotension and A. fib. This was a negative exploration.  11/17:  - T3a-renal cell carcinoma, chromophobe type. Tumor was invading into the renal vein as well as the collecting system. Margins were negative. 3/18: Chest x-ray negative for metastatic disease. 6/18: Negative CT abd/pelvis for malignancy. Punctate right renal stone. 3.5 cm AAA with recommendation for f/u u/s in 2 years. Cr stable at 1.43.  Interval: The patient presents today for routine follow-up 27months status post left radical nephrectomy. The patient has recovered quite well. He is moving his bowels normal, appetite is good, weight is stable. He has had no incisional trouble.     PMH: Past Medical History:  Diagnosis Date  . B12 deficiency   . Bradycardia   . Hyperlipidemia   . Hypertension   . Hypothyroidism   . Osteoarthritis   . Prostate cancer ([place]) 2001   Rad tx's + seed implants  . Renal cancer, left ([place]) 03/2016   Left Renal Nephrectomy    Surgical History: Past Surgical History:  Procedure Laterality Date  . CATARACT EXTRACTION, BILATERAL    . COLONOSCOPY    . EXCISIONAL HEMORRHOIDECTOMY    . INSERTION PROSTATE RADIATION SEED    . LAPAROSCOPIC NEPHRECTOMY, HAND ASSISTED Left 03/08/2016   Procedure: HAND ASSISTED LAPAROSCOPIC NEPHRECTOMY;  Surgeon: Nickie Retort, MD;  Location: [facility] ORS;  Service: Urology;  Laterality: Left;  . LAPAROTOMY N/A  03/13/2016   Procedure: EXPLORATORY LAPAROTOMY;  Surgeon: Festus Aloe, MD;  Location: [facility] ORS;  Service: Urology;  Laterality: N/A;    Home Medications:  Allergies as of 10/17/2016   No Known Allergies     Medication List       Accurate as of 10/17/16 10:21 AM. Always use your most recent med list.          aspirin EC 81 MG tablet Take 81 mg by mouth daily.   ciprofloxacin 250 MG tablet Commonly known as:  CIPRO Take by mouth.   feeding supplement (ENSURE ENLIVE) Liqd Take 237 mLs by mouth 2 (two) times daily with a meal.   levothyroxine 112 MCG tablet Commonly known as:  SYNTHROID, LEVOTHROID Take 112 mcg by mouth daily before breakfast.   lisinopril 20 MG tablet Commonly known as:  PRINIVIL,ZESTRIL Take by mouth.   lisinopril-hydrochlorothiazide 20-12.5 MG tablet Commonly known as:  PRINZIDE,ZESTORETIC take 1 tablet by mouth once daily   MULTI-VITAMINS Tabs Take by mouth.   RA VITAMIN B-12 TR 1000 MCG Tbcr Generic drug:  Cyanocobalamin Take 1,000 mcg by mouth daily.       Allergies: No Known Allergies  Family History: Family History  Problem Relation Age of Onset  . Hypertension Mother   . Breast cancer Mother   . Prostate cancer Neg Hx   . Bladder Cancer Neg Hx   . Kidney cancer Neg Hx     Social History:  reports that he has never smoked. He has never used smokeless tobacco. He reports  that he does not drink alcohol or use drugs.  ROS: UROLOGY Frequent Urination?: No Hard to postpone urination?: No Burning/pain with urination?: No Get up at night to urinate?: No Leakage of urine?: No Urine stream starts and stops?: No Trouble starting stream?: No Do you have to strain to urinate?: No Blood in urine?: No Urinary tract infection?: No Sexually transmitted disease?: No Injury to kidneys or bladder?: No Painful intercourse?: No Weak stream?: No Erection problems?: No Penile pain?: No  Gastrointestinal Nausea?: No Vomiting?:  No Indigestion/heartburn?: No Diarrhea?: No Constipation?: No  Constitutional Fever: No Night sweats?: No Weight loss?: No Fatigue?: No  Skin Skin rash/lesions?: No Itching?: No  Eyes Blurred vision?: No Double vision?: No  Ears/Nose/Throat Sore throat?: No Sinus problems?: No  Hematologic/Lymphatic Swollen glands?: No Easy bruising?: No  Cardiovascular Leg swelling?: No Chest pain?: No  Respiratory Cough?: No Shortness of breath?: No  Endocrine Excessive thirst?: No  Musculoskeletal Back pain?: No Joint pain?: No  Neurological Headaches?: No Dizziness?: No  Psychologic Depression?: No Anxiety?: No  Physical Exam: BP (!) 180/90   Pulse (!) 47   Ht 5\' 9"  (1.753 m)   Wt 166 lb 1.6 oz (75.3 kg)   BMI 24.53 kg/m   Constitutional:  Alert and oriented, No acute distress. HEENT: [place] AT, moist mucus membranes.  Trachea midline, no masses. Cardiovascular: No clubbing, cyanosis, or edema. Respiratory: Normal respiratory effort, no increased work of breathing. GI: Abdomen is soft, nontender, nondistended, no abdominal masses. Small incisional hernia without incarceration GU: No CVA tenderness.  Skin: No rashes, bruises or suspicious lesions. Lymph: No cervical or inguinal adenopathy. Neurologic: Grossly intact, no focal deficits, moving all 4 extremities. Psychiatric: Normal mood and affect.  Laboratory Data: Lab Results  Component Value Date   WBC 6.7 03/16/2016   HGB 9.3 (L) 03/16/2016   HCT 27.3 (L) 03/16/2016   MCV 92.4 03/16/2016   PLT 295 03/16/2016    Lab Results  Component Value Date   CREATININE 1.43 (H) 10/10/2016    No results found for: PSA  No results found for: TESTOSTERONE  No results found for: HGBA1C  Urinalysis    Component Value Date/Time   COLORURINE AMBER (A) 02/22/2016 1147   APPEARANCEUR HAZY (A) 02/22/2016 1147   LABSPEC 1.019 02/22/2016 1147   PHURINE 5.0 02/22/2016 1147   [place] 02/22/2016 1147    HGBUR 3+ (A) 02/22/2016 1147   BILIRUBINUR NEGATIVE 02/22/2016 1147   KETONESUR 1+ (A) 02/22/2016 1147   PROTEINUR 100 (A) 02/22/2016 1147   NITRITE NEGATIVE 02/22/2016 [place] 02/22/2016 1147    Pertinent Imaging: CT reviewed a above  Assessment & Plan:    1. T3a renal cell carcinoma-chromophobe type, margins negative.  -doing well. No evidence of disease. -follow up in 6 months with labs and CT/CXR w/w/o  2. 3.5 cm AAA Shared copy of report recommending follow-up ultrasound 2 years for his infrarenal abdominal aortic aneurysm. He was instructed to follow-up with this report with his primary care provider. He expressed an understanding.  Return in about 6 months (around 04/18/2017) for labs/CT/CXR prior.  Nickie Retort, MD  [facility] [address] [name], [place] [address] [phone]

## 2016-10-30 DIAGNOSIS — C4491 Basal cell carcinoma of skin, unspecified: Secondary | ICD-10-CM

## 2016-10-30 HISTORY — DX: Basal cell carcinoma of skin, unspecified: C44.91

## 2017-02-04 DIAGNOSIS — R001 Bradycardia, unspecified: Secondary | ICD-10-CM

## 2017-02-04 HISTORY — DX: Bradycardia, unspecified: R00.1

## 2017-02-19 ENCOUNTER — Inpatient Hospital Stay
Admission: EM | Admit: 2017-02-19 | Discharge: 2017-02-21 | DRG: 244 | Disposition: A | Discharge status: Home / Self Care | Payer: Medicare Other | Attending: Internal Medicine | Admitting: Internal Medicine

## 2017-02-19 ENCOUNTER — Emergency Department: Payer: Medicare Other

## 2017-02-19 ENCOUNTER — Encounter: Payer: Self-pay | Admitting: Emergency Medicine

## 2017-02-19 DIAGNOSIS — Z9842 Cataract extraction status, left eye: Secondary | ICD-10-CM

## 2017-02-19 DIAGNOSIS — Z85528 Personal history of other malignant neoplasm of kidney: Secondary | ICD-10-CM

## 2017-02-19 DIAGNOSIS — Z8546 Personal history of malignant neoplasm of prostate: Secondary | ICD-10-CM | POA: Diagnosis not present

## 2017-02-19 DIAGNOSIS — Z803 Family history of malignant neoplasm of breast: Secondary | ICD-10-CM

## 2017-02-19 DIAGNOSIS — R001 Bradycardia, unspecified: Secondary | ICD-10-CM | POA: Diagnosis present

## 2017-02-19 DIAGNOSIS — Z7982 Long term (current) use of aspirin: Secondary | ICD-10-CM | POA: Diagnosis not present

## 2017-02-19 DIAGNOSIS — I10 Essential (primary) hypertension: Secondary | ICD-10-CM | POA: Diagnosis present

## 2017-02-19 DIAGNOSIS — Z950 Presence of cardiac pacemaker: Secondary | ICD-10-CM

## 2017-02-19 DIAGNOSIS — Z905 Acquired absence of kidney: Secondary | ICD-10-CM | POA: Diagnosis not present

## 2017-02-19 DIAGNOSIS — M1990 Unspecified osteoarthritis, unspecified site: Secondary | ICD-10-CM | POA: Diagnosis present

## 2017-02-19 DIAGNOSIS — Z8249 Family history of ischemic heart disease and other diseases of the circulatory system: Secondary | ICD-10-CM | POA: Diagnosis not present

## 2017-02-19 DIAGNOSIS — E039 Hypothyroidism, unspecified: Secondary | ICD-10-CM | POA: Diagnosis present

## 2017-02-19 DIAGNOSIS — I480 Paroxysmal atrial fibrillation: Secondary | ICD-10-CM | POA: Diagnosis present

## 2017-02-19 DIAGNOSIS — R42 Dizziness and giddiness: Secondary | ICD-10-CM | POA: Diagnosis present

## 2017-02-19 DIAGNOSIS — Z9841 Cataract extraction status, right eye: Secondary | ICD-10-CM

## 2017-02-19 DIAGNOSIS — E785 Hyperlipidemia, unspecified: Secondary | ICD-10-CM | POA: Diagnosis present

## 2017-02-19 LAB — BASIC METABOLIC PANEL
ANION GAP: 10 (ref 5–15)
BUN: 22 mg/dL — ABNORMAL HIGH (ref 6–20)
CO2: 25 mmol/L (ref 22–32)
Calcium: 9.5 mg/dL (ref 8.9–10.3)
Chloride: 98 mmol/L — ABNORMAL LOW (ref 101–111)
Creatinine, Ser: 1.33 mg/dL — ABNORMAL HIGH (ref 0.61–1.24)
GFR calc Af Amer: 53 mL/min — ABNORMAL LOW (ref >60)
GFR, EST NON AFRICAN AMERICAN: 46 mL/min — AB (ref >60)
GLUCOSE: 97 mg/dL (ref 65–99)
POTASSIUM: 4.3 mmol/L (ref 3.5–5.1)
Sodium: 133 mmol/L — ABNORMAL LOW (ref 135–145)

## 2017-02-19 LAB — CBC
HEMATOCRIT: 36.7 % — AB (ref 40.0–52.0)
HEMOGLOBIN: 12.9 g/dL — AB (ref 13.0–18.0)
MCH: 32.5 pg (ref 26.0–34.0)
MCHC: 35.2 g/dL (ref 32.0–36.0)
MCV: 92.2 fL (ref 80.0–100.0)
Platelets: 254 10*3/uL (ref 150–440)
RBC: 3.97 MIL/uL — ABNORMAL LOW (ref 4.40–5.90)
RDW: 13.2 % (ref 11.5–14.5)
WBC: 3.9 10*3/uL (ref 3.8–10.6)

## 2017-02-19 LAB — URINALYSIS, COMPLETE (UACMP) WITH MICROSCOPIC
BACTERIA UA: NONE SEEN
BILIRUBIN URINE: NEGATIVE
Glucose, UA: NEGATIVE mg/dL
KETONES UR: NEGATIVE mg/dL
LEUKOCYTES UA: NEGATIVE
NITRITE: NEGATIVE
PROTEIN: NEGATIVE mg/dL
SPECIFIC GRAVITY, URINE: 1.006 (ref 1.005–1.030)
Squamous Epithelial / LPF: NONE SEEN
WBC UA: NONE SEEN WBC/hpf (ref 0–5)
pH: 7 (ref 5.0–8.0)

## 2017-02-19 LAB — TROPONIN I

## 2017-02-19 LAB — MRSA PCR SCREENING: MRSA by PCR: NEGATIVE

## 2017-02-19 MED ORDER — ACETAMINOPHEN 325 MG PO TABS
650.0000 mg | ORAL_TABLET | Freq: Four times a day (QID) | ORAL | Status: DC | PRN
  Administered 2017-02-20: 650 mg via ORAL
  Filled 2017-02-19: qty 2

## 2017-02-19 MED ORDER — AMLODIPINE BESYLATE 5 MG PO TABS
5.0000 mg | ORAL_TABLET | Freq: Every day | ORAL | Status: DC
  Administered 2017-02-19 – 2017-02-21 (×3): 5 mg via ORAL
  Filled 2017-02-19 (×3): qty 1

## 2017-02-19 MED ORDER — ENSURE ENLIVE PO LIQD
237.0000 mL | Freq: Two times a day (BID) | ORAL | Status: DC
  Administered 2017-02-19 – 2017-02-21 (×2): 237 mL via ORAL

## 2017-02-19 MED ORDER — LEVOTHYROXINE SODIUM 112 MCG PO TABS
112.0000 ug | ORAL_TABLET | Freq: Every day | ORAL | Status: DC
  Administered 2017-02-20 – 2017-02-21 (×2): 112 ug via ORAL
  Filled 2017-02-19 (×2): qty 1

## 2017-02-19 MED ORDER — VITAMIN B-12 1000 MCG PO TABS
1000.0000 ug | ORAL_TABLET | Freq: Every day | ORAL | Status: DC
  Administered 2017-02-19 – 2017-02-21 (×3): 1000 ug via ORAL
  Filled 2017-02-19 (×3): qty 1

## 2017-02-19 MED ORDER — ONDANSETRON HCL 4 MG/2ML IJ SOLN
4.0000 mg | Freq: Four times a day (QID) | INTRAMUSCULAR | Status: DC | PRN
  Filled 2017-02-19: qty 2

## 2017-02-19 MED ORDER — SODIUM CHLORIDE 0.9 % IV SOLN
INTRAVENOUS | Status: AC
  Administered 2017-02-19 – 2017-02-20 (×2): via INTRAVENOUS

## 2017-02-19 MED ORDER — ACETAMINOPHEN 650 MG RE SUPP: 650.0000 mg | Freq: Four times a day (QID) | RECTAL | Status: DC | PRN

## 2017-02-19 MED ORDER — ONDANSETRON HCL 4 MG PO TABS
4.0000 mg | ORAL_TABLET | Freq: Four times a day (QID) | ORAL | Status: DC | PRN
  Filled 2017-02-19: qty 1

## 2017-02-19 MED ORDER — ENOXAPARIN SODIUM 40 MG/0.4ML ~~LOC~~ SOLN
40.0000 mg | SUBCUTANEOUS | Status: DC
  Administered 2017-02-19 – 2017-02-20 (×2): 40 mg via SUBCUTANEOUS
  Filled 2017-02-19 (×2): qty 0.4

## 2017-02-19 MED ORDER — LISINOPRIL 20 MG PO TABS
20.0000 mg | ORAL_TABLET | Freq: Every day | ORAL | Status: DC
  Administered 2017-02-19 – 2017-02-21 (×3): 20 mg via ORAL
  Filled 2017-02-19 (×3): qty 1

## 2017-02-19 MED ORDER — ADULT MULTIVITAMIN W/MINERALS CH
1.0000 | ORAL_TABLET | Freq: Every day | ORAL | Status: DC
  Administered 2017-02-19 – 2017-02-21 (×3): 1 via ORAL
  Filled 2017-02-19 (×3): qty 1

## 2017-02-19 MED ORDER — CEFAZOLIN SODIUM-DEXTROSE 2-4 GM/100ML-% IV SOLN
2.0000 g | INTRAVENOUS | Status: AC
  Administered 2017-02-20: 2 g via INTRAVENOUS
  Filled 2017-02-19: qty 100

## 2017-02-19 NOTE — ED Triage Notes (Signed)
Patient to ED from [facility] living. Patient reports feeling increased weakness and dizziness for several days. States this morning when he got out of bad, he was very off balance and dizzy with walking. EMS reports HR in low 30's upon their arrival. Patient with history of bradycardia, with normal HR in 40's-50's. Patient alert and oriented upon arrival.

## 2017-02-19 NOTE — Consult Note (Signed)
[facility]  CARDIOLOGY CONSULT NOTE  Patient ID: Frank Bartlett MRN: [id number] DOB/AGE: 09/20/1925 83 y.o.  Admit date: 02/19/2017 Referring Physician Dr. Tressia Miners Primary Physician Dr. Ola Spurr Primary Cardiologist Dr. Saralyn Pilar Reason for Consultation bradycardia  HPI: Pt is a 83 yo male with history of paroxysmal afib, history of sinus bradycardia who has had bradycardia for some time, not felt to require ppm due to lack of symptoms. His heart rates have been in the low to mid 50's as outpatient.  He presented to the er from assisted living with complaints of dizziness and palpitations. He has not been taking any av nodal blocking agents. He was noted to have heart rates in the 30's and ems was called. In the er heart rates were in the 30's to 40's . Head ct was unremarkable for bleed. While in the er, heart rates were in the 40's to 50's. Electrolytes were normal with pts renal function appears to be at his baseline with creatinine of 1.33. Heart rate is currently sinus brady at 47. Pt is hypertensive. Dizziness has improved. No pauses. He has ruled out for an mi.   Review of Systems  HENT: Negative.   Eyes: Negative.   Respiratory: Negative.   Cardiovascular: Negative.   Gastrointestinal: Negative.   Genitourinary: Negative.   Musculoskeletal: Negative.   Skin: Negative.   Neurological: Positive for dizziness and weakness.  Endo/Heme/Allergies: Negative.   Psychiatric/Behavioral: Negative.     Past Medical History:  Diagnosis Date  . B12 deficiency   . Bradycardia   . Hyperlipidemia   . Hypertension   . Hypothyroidism   . Osteoarthritis   . Prostate cancer ([place]) 2001   Rad tx's + seed implants  . Renal cancer, left ([place]) 03/2016   Left Renal Nephrectomy    Family History  Problem Relation Age of Onset  . Hypertension Mother   . Breast cancer Mother   . Prostate cancer Neg Hx   . Bladder Cancer Neg Hx   .  Kidney cancer Neg Hx     Social History   Social History  . Marital status: Widowed    Spouse name: N/A  . Number of children: N/A  . Years of education: N/A   Occupational History  . Not on file.   Social History Main Topics  . Smoking status: Never Smoker  . Smokeless tobacco: Never Used  . Alcohol use No     Comment: occasionally  . Drug use: No  . Sexual activity: Not on file   Other Topics Concern  . Not on file   Social History Narrative   Lives at [facility] independent living.   Ambulates with a walker    Past Surgical History:  Procedure Laterality Date  . CATARACT EXTRACTION, BILATERAL    . COLONOSCOPY    . EXCISIONAL HEMORRHOIDECTOMY    . INSERTION PROSTATE RADIATION SEED    . LAPAROSCOPIC NEPHRECTOMY, HAND ASSISTED Left 03/08/2016   Procedure: HAND ASSISTED LAPAROSCOPIC NEPHRECTOMY;  Surgeon: Nickie Retort, MD;  Location: [facility] ORS;  Service: Urology;  Laterality: Left;  . LAPAROTOMY N/A 03/13/2016   Procedure: EXPLORATORY LAPAROTOMY;  Surgeon: Festus Aloe, MD;  Location: [facility] ORS;  Service: Urology;  Laterality: N/A;     Prescriptions Prior to Admission  Medication Sig Dispense Refill Last Dose  . aspirin EC 81 MG tablet Take 81 mg by mouth daily.    02/18/2017 at 0800  . Cyanocobalamin (RA VITAMIN B-12  TR) 1000 MCG TBCR Take 1,000 mcg by mouth daily.    02/18/2017 at 0800  . levothyroxine (SYNTHROID, LEVOTHROID) 112 MCG tablet Take 112 mcg by mouth daily before breakfast.   02/18/2017 at 0800  . lisinopril (PRINIVIL,ZESTRIL) 20 MG tablet Take by mouth.   02/18/2017 at 0800  . Multiple Vitamin (MULTI-VITAMINS) TABS Take by mouth.   02/18/2017 at 0800  . feeding supplement, ENSURE ENLIVE, (ENSURE ENLIVE) LIQD Take 237 mLs by mouth 2 (two) times daily with a meal. 237 mL 12 Taking    Physical Exam: Blood pressure (!) 180/78, pulse (!) 45, temperature 97.7 F (36.5 C), temperature source Oral, resp. rate 18, height 5\' 9"  (1.753 m), weight 70.2 kg  (154 lb 12.8 oz), SpO2 100 %.   Wt Readings from Last 1 Encounters:  02/19/17 70.2 kg (154 lb 12.8 oz)     General appearance: alert and cooperative Resp: clear to auscultation bilaterally Cardio: bradycardia GI: soft, non-tender; bowel sounds normal; no masses,  no organomegaly Extremities: extremities normal, atraumatic, no cyanosis or edema Neurologic: Grossly normal  Labs:   Lab Results  Component Value Date   WBC 3.9 02/19/2017   HGB 12.9 (L) 02/19/2017   HCT 36.7 (L) 02/19/2017   MCV 92.2 02/19/2017   PLT 254 02/19/2017    Recent Labs Lab 02/19/17 1026  NA 133*  K 4.3  CL 98*  CO2 25  BUN 22*  CREATININE 1.33*  CALCIUM 9.5  GLUCOSE 97   Lab Results  Component Value Date   TROPONINI <0.03 02/19/2017       EKG: sinus bradycardia  ASSESSMENT AND PLAN:  83 yo male with history of sinus bradycardia with paroxysmal afib occurring after surgeries in the past who was been followed for bradycardia which was not felt to be symptomatic. This am was dizzy and was noted to have heart rates in the low 40's and mid 30's. He denies syncope and is not on any av nodal meds. Heart rates are currently in the low to med 40's with occasional dips into the upper 30's . Pt is fairly mobile at home. Will need to discuss ppm. WIll treat blood pressure with lisinopril and amlodipine and discuss ppm with Dr. Saralyn Pilar to be done during this admission.  Signed: Teodoro Spray MD, [facility] 02/19/2017, 1:46 PM

## 2017-02-19 NOTE — ED Provider Notes (Signed)
[facility] Emergency Department Provider Note ____________________________________________   I have reviewed the triage vital signs and the triage nursing note.  HISTORY  Chief Complaint Dizziness   Historian Patient patient advocate from [facility] at the bedside. Additional history per report from the nurse at [facility].  HPI Frank Bartlett is a 83 y.o. male living at assisted living at [facility], follows with cardiologist Dr. Saralyn Pilar for sinus bradycardia and an episode post operatively of paroxysmal afib, presents for complaint of dizziness this morning upon waking.  He states that he felt like he was leaning to the right, but denies room spinning, any focal weakness or numbness. He denies palpitations or chest pain or shortness of breath.  He states that he was told at his last cardiology appointment that he did not need a pacemaker "yet. "  He is not on any AV nodal blocking agents. He is just on aspirin.  Not reporting any nausea vomiting or diarrhea, or shortness of breath symptoms or fever symptoms.  reportedly EMS was called because the patient was complaining of dizziness and found to have a heart rate in the 30s.    Past Medical History:  Diagnosis Date  . B12 deficiency   . Bradycardia   . Hyperlipidemia   . Hypertension   . Hypothyroidism   . Osteoarthritis   . Prostate cancer ([place]) 2001   Rad tx's + seed implants  . Renal cancer, left ([place]) 03/2016   Left Renal Nephrectomy    Patient Active Problem List   Diagnosis Date Noted  . Renal cell cancer, left ([place]) 03/26/2016  . Pneumoperitoneum 03/13/2016  . Bowel perforation ([place])   . Generalized abdominal pain   . Atrial fibrillation ([place])   . Cough   . Left renal mass 03/08/2016  . Acute renal failure ([place]) 07/01/2015  . C. difficile colitis 07/01/2015  . Generalized weakness 07/01/2015  . Hypokalemia 07/01/2015  . Elevated transaminase level 07/01/2015  .  Leukocytosis 07/01/2015  . Anemia 07/01/2015  . Hyponatremia 06/29/2015    Past Surgical History:  Procedure Laterality Date  . CATARACT EXTRACTION, BILATERAL    . COLONOSCOPY    . EXCISIONAL HEMORRHOIDECTOMY    . INSERTION PROSTATE RADIATION SEED    . LAPAROSCOPIC NEPHRECTOMY, HAND ASSISTED Left 03/08/2016   Procedure: HAND ASSISTED LAPAROSCOPIC NEPHRECTOMY;  Surgeon: Nickie Retort, MD;  Location: [facility] ORS;  Service: Urology;  Laterality: Left;  . LAPAROTOMY N/A 03/13/2016   Procedure: EXPLORATORY LAPAROTOMY;  Surgeon: Festus Aloe, MD;  Location: [facility] ORS;  Service: Urology;  Laterality: N/A;    Prior to Admission medications   Medication Sig Start Date End Date Taking? Authorizing Provider  aspirin EC 81 MG tablet Take 81 mg by mouth daily.    Yes [provider]  Cyanocobalamin (RA VITAMIN B-12 TR) 1000 MCG TBCR Take 1,000 mcg by mouth daily.    Yes [provider]  levothyroxine (SYNTHROID, LEVOTHROID) 112 MCG tablet Take 112 mcg by mouth daily before breakfast.   Yes [provider]  lisinopril (PRINIVIL,ZESTRIL) 20 MG tablet Take by mouth. 10/02/16 10/02/17 Yes [provider]  Multiple Vitamin (MULTI-VITAMINS) TABS Take by mouth.   Yes [provider]  feeding supplement, ENSURE ENLIVE, (ENSURE ENLIVE) LIQD Take 237 mLs by mouth 2 (two) times daily with a meal. 07/01/15   Theodoro Grist, MD    No Known Allergies  Family History  Problem Relation Age of Onset  . Hypertension Mother   .  Breast cancer Mother   . Prostate cancer Neg Hx   . Bladder Cancer Neg Hx   . Kidney cancer Neg Hx     Social History Social History  Substance Use Topics  . Smoking status: Never Smoker  . Smokeless tobacco: Never Used  . Alcohol use No     Comment: occasionally    Review of Systems  Constitutional: Negative for fever. Eyes: Negative for visual changes. ENT: Negative for sore throat. Cardiovascular: Negative for chest  pain. Respiratory: Negative for shortness of breath. Gastrointestinal: Negative for abdominal pain, vomiting and diarrhea. Genitourinary: Negative for dysuria. Musculoskeletal: Negative for back pain. Skin: Negative for rash. Neurological: Negative for headache.  ____________________________________________   PHYSICAL EXAM:  VITAL SIGNS: ED Triage Vitals  Enc Vitals Group     BP 02/19/17 1026 (!) 192/105     Pulse Rate 02/19/17 1026 (!) 44     Resp 02/19/17 1026 20     Temp 02/19/17 1026 97.7 F (36.5 C)     Temp Source 02/19/17 1026 Oral     SpO2 02/19/17 1026 100 %     Weight 02/19/17 1012 155 lb (70.3 kg)     Height 02/19/17 1012 5\' 9"  (1.753 m)     Head Circumference --      Peak Flow --      Pain Score --      Pain Loc --      Pain Edu? --      Excl. in [place]? --      Constitutional: Alert and oriented. Well appearing and in no distress. HEENT   Head: Normocephalic and atraumatic.      Eyes: Conjunctivae are normal. Pupils equal and round.       Ears:         Nose: No congestion/rhinnorhea.   Mouth/Throat: Mucous membranes are moist.   Neck: No stridor. Cardiovascular/Chest:bradycardic and irregular.  No murmurs, rubs, or gallops. Respiratory: Normal respiratory effort without tachypnea nor retractions. Breath sounds are clear and equal bilaterally. No wheezes/rales/rhonchi. Gastrointestinal: Soft. No distention, no guarding, no rebound. Nontender.    Genitourinary/rectal:Deferred Musculoskeletal: Nontender with normal range of motion in all extremities. No joint effusions.  No lower extremity tenderness.  No edema. Neurologic:  no facial droop.Normal speech and language. No gross or focal neurologic deficits are appreciated.  coordination intact. Skin:  Skin is warm, dry and intact. No rash noted. Psychiatric: Mood and affect are normal. Speech and behavior are normal. Patient exhibits appropriate insight and  judgment.   ____________________________________________  LABS (pertinent positives/negatives) I, Lisa Roca, MD the attending physician have reviewed the labs noted below.  Labs Reviewed  BASIC METABOLIC PANEL - Abnormal; Notable for the following:       Result Value   Sodium 133 (*)    Chloride 98 (*)    BUN 22 (*)    Creatinine, Ser 1.33 (*)    GFR calc non Af Amer 46 (*)    GFR calc Af Amer 53 (*)    All other components within normal limits  CBC - Abnormal; Notable for the following:    RBC 3.97 (*)    Hemoglobin 12.9 (*)    HCT 36.7 (*)    All other components within normal limits  URINALYSIS, COMPLETE (UACMP) WITH MICROSCOPIC - Abnormal; Notable for the following:    Color, Urine STRAW (*)    APPearance CLEAR (*)    Hgb urine dipstick SMALL (*)    All other components within  normal limits  TROPONIN I    ____________________________________________    EKG I, Lisa Roca, MD, the attending physician have personally viewed and interpreted all ECGs.  sinus bradycardia. 49 bpm. Narrow QRS. Normal axis. Appears to be first degree AV block. ____________________________________________  RADIOLOGY All Xrays were viewed by me.  Imaging interpreted by Radiologist, and I, Lisa Roca, MD the attending physician have reviewed the radiologist interpretation noted below.  CT head without contrast:  IMPRESSION: 1. No acute intracranial abnormality. 2. Stable age related involutional changes. __________________________________________  PROCEDURES  Procedure(s) performed: None  Critical Care performed: None  ____________________________________________  No current facility-administered medications on file prior to encounter.    Current Outpatient Prescriptions on File Prior to Encounter  Medication Sig Dispense Refill  . aspirin EC 81 MG tablet Take 81 mg by mouth daily.     . Cyanocobalamin (RA VITAMIN B-12 TR) 1000 MCG TBCR Take 1,000 mcg by mouth daily.      [place] levothyroxine (SYNTHROID, LEVOTHROID) 112 MCG tablet Take 112 mcg by mouth daily before breakfast.    . lisinopril (PRINIVIL,ZESTRIL) 20 MG tablet Take by mouth.    . Multiple Vitamin (MULTI-VITAMINS) TABS Take by mouth.    . feeding supplement, ENSURE ENLIVE, (ENSURE ENLIVE) LIQD Take 237 mLs by mouth 2 (two) times daily with a meal. 237 mL 12    ____________________________________________  ED COURSE / ASSESSMENT AND PLAN  Pertinent labs & imaging results that were available during my care of the patient were reviewed by me and considered in my medical decision making (see chart for details).   patient presents with dizziness, and was found to have significant bradycardia and arthritis and in. He does have a history of sinus bradycardia, although most recent note from Dr. Saralyn Pilar indicates patient is typically in the 40s. He is not in medications that she make this worse. Today his heart rate is mostly in the 40s anywhere from 41-54.  Does not seem to have any other source for his complaint of dizziness, and I do suspect that the symptom for which he came in is likely from symptomatic bradycardia.  No evidence for stroke or MI, or significant electrolyte disturbance, or UTI, etc.  it was not noted on the previous note regarding reasons why patient did not yet need a pacemaker, but sounds like this is a change and worsening from previous with symptomatic bradycardia down into the 30s.  I suspect this may need to be reviewed with the cardiologist with hospital admission.    DIFFERENTIAL DIAGNOSIS: including but not limited to urinary tract infection, stroke, multiple types of arrhythmias including complete heart block, sinus bradycardia, etc., MI, anemia, electrolyte disturbance, vertigo, etc.  CONSULTATIONS:   Dr. Ubaldo Glassing, cardiology - will see in the hospital.  Hospitalist for admission.   Patient / Family / Caregiver informed of clinical course, medical decision-making process, and  agree with plan.   ___________________________________________   FINAL CLINICAL IMPRESSION(S) / ED DIAGNOSES   Final diagnoses:  Symptomatic bradycardia  Dizziness              Note: This dictation was prepared with Dragon dictation. Any transcriptional errors that result from this process are unintentional    Lisa Roca, MD 02/19/17 1221

## 2017-02-19 NOTE — H&P (Signed)
[facility] [place] NAME: Frank Bartlett    MR#:  [id number]  DATE OF BIRTH:  12/16/1925  DATE OF ADMISSION:  02/19/2017  PRIMARY CARE PHYSICIAN: Leonel Ramsay, MD   REQUESTING/REFERRING PHYSICIAN: Dr. Lisa Roca  CHIEF COMPLAINT:   Chief Complaint  Patient presents with  . Dizziness    HISTORY OF PRESENT ILLNESS:  Frank Bartlett  is a 83 y.o. male with a known history of hypothyroidism, left renal mass status post left nephrectomy, hypertension, hyperlipidemia and known history of sinus bradycardia presents to hospital secondary to worsening dizziness today. Patient has been following with cardiology for sinus bradycardia, but since he was asymptomatic no permanent pacemaker was considered at that time. Patient states he was fine last night, however woke up in the middle of the night to use the bathroom. He tried to get up and use his walker however felt to be very dizzy and unsteady. He came back and went back to sleep. This morning when he woke up he continued to feel extremely dizzy and was afraid that he was "a fall down. The nurses from [facility] examined him and he was noted to have heart rate in the 30s. When he is laying flat, his heart rate is in the 40s and he denies any dizziness. No dizziness on turning his head in the bed But when he gets up he feels that he gets extremely dizzy. His blood work is otherwise normal, urine analysis did not show any infection. He is being admitted for symptomatic bradycardia.  PAST MEDICAL HISTORY:   Past Medical History:  Diagnosis Date  . B12 deficiency   . Bradycardia   . Hyperlipidemia   . Hypertension   . Hypothyroidism   . Osteoarthritis   . Prostate cancer ([place]) 2001   Rad tx's + seed implants  . Renal cancer, left ([place]) 03/2016   Left Renal Nephrectomy    PAST SURGICAL HISTORY:   Past Surgical History:  Procedure Laterality Date  . CATARACT EXTRACTION, BILATERAL      . COLONOSCOPY    . EXCISIONAL HEMORRHOIDECTOMY    . INSERTION PROSTATE RADIATION SEED    . LAPAROSCOPIC NEPHRECTOMY, HAND ASSISTED Left 03/08/2016   Procedure: HAND ASSISTED LAPAROSCOPIC NEPHRECTOMY;  Surgeon: Nickie Retort, MD;  Location: [facility] ORS;  Service: Urology;  Laterality: Left;  . LAPAROTOMY N/A 03/13/2016   Procedure: EXPLORATORY LAPAROTOMY;  Surgeon: Festus Aloe, MD;  Location: [facility] ORS;  Service: Urology;  Laterality: N/A;    SOCIAL HISTORY:   Social History  Substance Use Topics  . Smoking status: Never Smoker  . Smokeless tobacco: Never Used  . Alcohol use No     Comment: occasionally    FAMILY HISTORY:   Family History  Problem Relation Age of Onset  . Hypertension Mother   . Breast cancer Mother   . Prostate cancer Neg Hx   . Bladder Cancer Neg Hx   . Kidney cancer Neg Hx     DRUG ALLERGIES:  No Known Allergies  REVIEW OF SYSTEMS:   Review of Systems  Constitutional: Positive for malaise/fatigue. Negative for chills, fever and weight loss.  HENT: Negative for ear discharge, ear pain, hearing loss, nosebleeds and tinnitus.   Eyes: Negative for blurred vision, double vision and photophobia.  Respiratory: Negative for cough, hemoptysis, shortness of breath and wheezing.   Cardiovascular: Negative for chest pain, palpitations, orthopnea and leg swelling.  Gastrointestinal: Negative for abdominal pain, constipation, diarrhea,  heartburn, melena, nausea and vomiting.  Genitourinary: Negative for dysuria, frequency, hematuria and urgency.  Musculoskeletal: Negative for back pain, myalgias and neck pain.  Skin: Negative for rash.  Neurological: Positive for dizziness. Negative for tingling, tremors, sensory change, speech change, focal weakness and headaches.  Endo/Heme/Allergies: Does not bruise/bleed easily.  Psychiatric/Behavioral: Negative for depression.    MEDICATIONS AT HOME:   Prior to Admission medications   Medication Sig Start Date End  Date Taking? Authorizing Provider  aspirin EC 81 MG tablet Take 81 mg by mouth daily.    Yes [provider]  Cyanocobalamin (RA VITAMIN B-12 TR) 1000 MCG TBCR Take 1,000 mcg by mouth daily.    Yes [provider]  levothyroxine (SYNTHROID, LEVOTHROID) 112 MCG tablet Take 112 mcg by mouth daily before breakfast.   Yes [provider]  lisinopril (PRINIVIL,ZESTRIL) 20 MG tablet Take by mouth. 10/02/16 10/02/17 Yes [provider]  Multiple Vitamin (MULTI-VITAMINS) TABS Take by mouth.   Yes [provider]  feeding supplement, ENSURE ENLIVE, (ENSURE ENLIVE) LIQD Take 237 mLs by mouth 2 (two) times daily with a meal. 07/01/15   Theodoro Grist, MD      VITAL SIGNS:  Blood pressure (!) 155/75, pulse (!) 40, temperature 97.7 F (36.5 C), temperature source Oral, resp. rate 20, height 5\' 9"  (1.753 m), weight 70.3 kg (155 lb), SpO2 100 %.  PHYSICAL EXAMINATION:   Physical Exam  GENERAL:  83 y.o.-year-old elderly patient lying in the bed with no acute distress.  EYES: Pupils equal, round, reactive to light and accommodation. No scleral icterus. Extraocular muscles intact.  HEENT: Head atraumatic, normocephalic. Oropharynx and nasopharynx clear.  NECK:  Supple, no jugular venous distention. No thyroid enlargement, no tenderness.  LUNGS: Normal breath sounds bilaterally, no wheezing, rales,rhonchi or crepitation. No use of accessory muscles of respiration.  CARDIOVASCULAR: S1, S2 slow and regular. No murmurs, rubs, or gallops.  ABDOMEN: Soft, nontender, nondistended. Bowel sounds present. No organomegaly or mass.  EXTREMITIES: No pedal edema, cyanosis, or clubbing.  NEUROLOGIC: Cranial nerves II through XII are intact. Muscle strength 5/5 in all extremities. Sensation intact. Gait not checked. Global weakness noted. PSYCHIATRIC: The patient is alert and oriented x 3.  SKIN: No obvious rash, lesion, or ulcer.   LABORATORY PANEL:   CBC  Recent Labs Lab  02/19/17 1026  WBC 3.9  HGB 12.9*  HCT 36.7*  PLT 254   ------------------------------------------------------------------------------------------------------------------  Chemistries   Recent Labs Lab 02/19/17 1026  NA 133*  K 4.3  CL 98*  CO2 25  GLUCOSE 97  BUN 22*  CREATININE 1.33*  CALCIUM 9.5   ------------------------------------------------------------------------------------------------------------------  Cardiac Enzymes  Recent Labs Lab 02/19/17 1026  TROPONINI <0.03   ------------------------------------------------------------------------------------------------------------------  RADIOLOGY:  Ct Head Wo Contrast  Result Date: 02/19/2017 CLINICAL DATA:  83 year old male with progressive weakness and dizziness. EXAM: CT HEAD WITHOUT CONTRAST TECHNIQUE: Contiguous axial images were obtained from the base of the skull through the vertex without intravenous contrast. COMPARISON:  Head CT 02/22/2016 FINDINGS: Brain: No evidence of acute infarction, hemorrhage, hydrocephalus, extra-axial collection or mass lesion/mass effect. Stable cerebral and cerebellar cortical volume loss. Mild periventricular white matter hypoattenuation most consistent with the sequelae of chronic microvascular ischemic white matter disease. Calcification along the falx cerebrum. Vascular: No hyperdense vessel or unexpected calcification. Skull: Normal. Negative for fracture or focal lesion. Sinuses/Orbits: No acute finding. Other: None. IMPRESSION: 1. No acute intracranial abnormality. 2. Stable age related involutional changes. Electronically Signed   By: Myrle Sheng  Laurence Ferrari M.D.   On: 02/19/2017 11:55    EKG:   Orders placed or performed during the hospital encounter of 02/19/17  . ED EKG  . ED EKG    IMPRESSION AND PLAN:   Frank Bartlett  is a 83 y.o. male with a known history of hypothyroidism, left renal mass status post left nephrectomy, hypertension, hyperlipidemia and known  history of sinus bradycardia presents to hospital secondary to worsening dizziness today.  #1 symptomatic bradycardia-known history of sinus bradycardia, however very symptomatic with heart rate in the 30s today -admit to telemetry, keep pacer pads on -cardiology consulted -Not on any rate limiting medications.  #2 hypothyroidism-continue Synthroid. Check TSH with his bradycardia  #3 hypertension-continue lisinopril  #4 DVT prophylaxis-Lovenox    All the records are reviewed and case discussed with ED provider. Management plans discussed with the patient, family and they are in agreement.  CODE STATUS: Full Code  TOTAL TIME TAKING CARE OF THIS PATIENT: 50 minutes.    Kayliee Atienza M.D on 02/19/2017 at 1:07 PM  Between 7am to 6pm - Pager - [phone]  After 6pm go to [email or web address] - password [email or web address] [facility]  Office  [phone]  CC: Primary care physician; Leonel Ramsay, MD

## 2017-02-19 NOTE — Plan of Care (Signed)
Problem: Safety: Goal: Ability to remain free from injury will improve Outcome: Progressing Fall precautions in place, non skid socks when oob  Problem: Tissue Perfusion: Goal: Risk factors for ineffective tissue perfusion will decrease Outcome: Progressing SQ Lovenox

## 2017-02-20 ENCOUNTER — Inpatient Hospital Stay: Payer: Medicare Other

## 2017-02-20 ENCOUNTER — Encounter: Payer: Self-pay | Admitting: Anesthesiology

## 2017-02-20 ENCOUNTER — Encounter
Admission: EM | Disposition: A | Discharge status: Home / Self Care | Payer: Self-pay | Source: Home / Self Care | Attending: Internal Medicine

## 2017-02-20 ENCOUNTER — Inpatient Hospital Stay: Payer: Medicare Other | Admitting: Anesthesiology

## 2017-02-20 HISTORY — PX: PACEMAKER INSERTION: SHX728

## 2017-02-20 LAB — BASIC METABOLIC PANEL
ANION GAP: 7 (ref 5–15)
BUN: 26 mg/dL — ABNORMAL HIGH (ref 6–20)
CO2: 25 mmol/L (ref 22–32)
Calcium: 9 mg/dL (ref 8.9–10.3)
Chloride: 103 mmol/L (ref 101–111)
Creatinine, Ser: 1.2 mg/dL (ref 0.61–1.24)
GFR calc Af Amer: >60 mL/min (ref >60)
GFR, EST NON AFRICAN AMERICAN: 52 mL/min — AB (ref >60)
Glucose, Bld: 100 mg/dL — ABNORMAL HIGH (ref 65–99)
POTASSIUM: 4.1 mmol/L (ref 3.5–5.1)
SODIUM: 135 mmol/L (ref 135–145)

## 2017-02-20 LAB — CBC
HCT: 35 % — ABNORMAL LOW (ref 40.0–52.0)
HEMOGLOBIN: 12.2 g/dL — AB (ref 13.0–18.0)
MCH: 32.3 pg (ref 26.0–34.0)
MCHC: 34.9 g/dL (ref 32.0–36.0)
MCV: 92.4 fL (ref 80.0–100.0)
Platelets: 237 10*3/uL (ref 150–440)
RBC: 3.78 MIL/uL — ABNORMAL LOW (ref 4.40–5.90)
RDW: 13.3 % (ref 11.5–14.5)
WBC: 5.2 10*3/uL (ref 3.8–10.6)

## 2017-02-20 LAB — PROTIME-INR
INR: 0.98
Prothrombin Time: 12.9 seconds (ref 11.4–15.2)

## 2017-02-20 SURGERY — INSERTION, CARDIAC PACEMAKER
Anesthesia: Monitor Anesthesia Care | Site: Shoulder | Wound class: Clean

## 2017-02-20 MED ORDER — LIDOCAINE 1 % OPTIME INJ - NO CHARGE
INTRAMUSCULAR | Status: DC | PRN
  Administered 2017-02-20: 30 mL

## 2017-02-20 MED ORDER — IOPAMIDOL (ISOVUE-300) INJECTION 61%
INTRAVENOUS | Status: DC | PRN
  Administered 2017-02-20: 35 mL via INTRAVENOUS

## 2017-02-20 MED ORDER — GENTAMICIN SULFATE 40 MG/ML IJ SOLN
INTRAMUSCULAR | Status: AC
  Filled 2017-02-20: qty 2

## 2017-02-20 MED ORDER — IOPAMIDOL (ISOVUE-300) INJECTION 61%
INTRAVENOUS | Status: DC | PRN
  Administered 2017-02-20: 15 mL via INTRAVENOUS
  Administered 2017-02-20: 20 mL via INTRAVENOUS

## 2017-02-20 MED ORDER — CHLORHEXIDINE GLUCONATE 4 % EX LIQD: 60.0000 mL | Freq: Once | CUTANEOUS | Status: DC

## 2017-02-20 MED ORDER — CEFAZOLIN SODIUM-DEXTROSE 1-4 GM/50ML-% IV SOLN
1.0000 g | Freq: Four times a day (QID) | INTRAVENOUS | Status: AC
  Administered 2017-02-20 – 2017-02-21 (×3): 1 g via INTRAVENOUS
  Filled 2017-02-20 (×3): qty 50

## 2017-02-20 MED ORDER — PROPOFOL 10 MG/ML IV BOLUS
INTRAVENOUS | Status: AC
  Filled 2017-02-20: qty 20

## 2017-02-20 MED ORDER — FENTANYL CITRATE (PF) 100 MCG/2ML IJ SOLN: 25.0000 ug | INTRAMUSCULAR | Status: DC | PRN

## 2017-02-20 MED ORDER — SODIUM CHLORIDE 0.9 % IJ SOLN
INTRAMUSCULAR | Status: AC
  Filled 2017-02-20: qty 50

## 2017-02-20 MED ORDER — ONDANSETRON HCL 4 MG/2ML IJ SOLN
INTRAMUSCULAR | Status: DC | PRN
  Administered 2017-02-20: 4 mg via INTRAVENOUS

## 2017-02-20 MED ORDER — FENTANYL CITRATE (PF) 100 MCG/2ML IJ SOLN
INTRAMUSCULAR | Status: DC | PRN
Start: 2017-02-20 — End: 2017-02-20
  Administered 2017-02-20 (×4): 25 ug via INTRAVENOUS

## 2017-02-20 MED ORDER — ACETAMINOPHEN 325 MG PO TABS: 325.0000 mg | ORAL_TABLET | ORAL | Status: DC | PRN

## 2017-02-20 MED ORDER — ONDANSETRON HCL 4 MG/2ML IJ SOLN
INTRAMUSCULAR | Status: AC
  Filled 2017-02-20: qty 2

## 2017-02-20 MED ORDER — DEXTROSE 5 % IV SOLN
1.0000 g | Freq: Four times a day (QID) | INTRAVENOUS | Status: DC
  Filled 2017-02-20 (×3): qty 10

## 2017-02-20 MED ORDER — SODIUM CHLORIDE 0.9 % IR SOLN
Status: DC | PRN
  Administered 2017-02-20: 300 mL

## 2017-02-20 MED ORDER — ONDANSETRON HCL 4 MG/2ML IJ SOLN: 4.0000 mg | Freq: Four times a day (QID) | INTRAMUSCULAR | Status: DC | PRN

## 2017-02-20 MED ORDER — CEFAZOLIN SODIUM-DEXTROSE 1-4 GM/50ML-% IV SOLN: 1.0000 g | Freq: Four times a day (QID) | INTRAVENOUS | Status: DC

## 2017-02-20 MED ORDER — SODIUM CHLORIDE FLUSH 0.9 % IV SOLN
INTRAVENOUS | Status: AC
  Filled 2017-02-20: qty 10

## 2017-02-20 MED ORDER — CEFAZOLIN SODIUM-DEXTROSE 2-4 GM/100ML-% IV SOLN
INTRAVENOUS | Status: AC
  Filled 2017-02-20: qty 100

## 2017-02-20 MED ORDER — LIDOCAINE HCL (PF) 2 % IJ SOLN
INTRAMUSCULAR | Status: AC
  Filled 2017-02-20: qty 10

## 2017-02-20 MED ORDER — SODIUM CHLORIDE 0.9 % IV SOLN
INTRAVENOUS | Status: DC | PRN
  Administered 2017-02-20: 13:00:00 via INTRAVENOUS

## 2017-02-20 MED ORDER — MIDAZOLAM HCL 2 MG/2ML IJ SOLN
INTRAMUSCULAR | Status: AC
  Filled 2017-02-20: qty 2

## 2017-02-20 MED ORDER — MIDAZOLAM HCL 5 MG/5ML IJ SOLN
INTRAMUSCULAR | Status: DC | PRN
  Administered 2017-02-20 (×2): 0.5 mg via INTRAVENOUS

## 2017-02-20 MED ORDER — ONDANSETRON HCL 4 MG/2ML IJ SOLN
INTRAMUSCULAR | Status: AC
  Administered 2017-02-20: 15:00:00
  Filled 2017-02-20: qty 2

## 2017-02-20 MED ORDER — ONDANSETRON HCL 4 MG/2ML IJ SOLN
4.0000 mg | Freq: Once | INTRAMUSCULAR | Status: AC | PRN
  Administered 2017-02-20: 4 mg via INTRAVENOUS

## 2017-02-20 MED ORDER — FENTANYL CITRATE (PF) 100 MCG/2ML IJ SOLN
INTRAMUSCULAR | Status: AC
  Filled 2017-02-20: qty 2

## 2017-02-20 SURGICAL SUPPLY — 35 items
BAG DECANTER FOR FLEXI CONT (MISCELLANEOUS) ×3 IMPLANT
BRUSH SCRUB EZ  4% CHG (MISCELLANEOUS) ×2
BRUSH SCRUB EZ 4% CHG (MISCELLANEOUS) ×1 IMPLANT
CABLE SURG 12 DISP A/V CHANNEL (MISCELLANEOUS) ×3 IMPLANT
CANISTER SUCT 1200ML W/VALVE (MISCELLANEOUS) ×3 IMPLANT
CHLORAPREP W/TINT 26ML (MISCELLANEOUS) ×3 IMPLANT
COVER LIGHT HANDLE STERIS (MISCELLANEOUS) ×6 IMPLANT
COVER MAYO STAND STRL (DRAPES) ×3 IMPLANT
DRAPE C-ARM XRAY 36X54 (DRAPES) ×3 IMPLANT
DRSG TEGADERM 4X4.75 (GAUZE/BANDAGES/DRESSINGS) ×3 IMPLANT
DRSG TELFA 4X3 1S NADH ST (GAUZE/BANDAGES/DRESSINGS) ×3 IMPLANT
ELECT REM PT RETURN 9FT ADLT (ELECTROSURGICAL) ×3
ELECTRODE REM PT RTRN 9FT ADLT (ELECTROSURGICAL) ×1 IMPLANT
GLOVE BIO SURGEON STRL SZ7.5 (GLOVE) ×3 IMPLANT
GLOVE BIO SURGEON STRL SZ8 (GLOVE) ×3 IMPLANT
GOWN STRL REUS W/ TWL LRG LVL3 (GOWN DISPOSABLE) ×1 IMPLANT
GOWN STRL REUS W/ TWL XL LVL3 (GOWN DISPOSABLE) ×1 IMPLANT
GOWN STRL REUS W/TWL LRG LVL3 (GOWN DISPOSABLE) ×2
GOWN STRL REUS W/TWL XL LVL3 (GOWN DISPOSABLE) ×2
IMMOBILIZER SHDR MD LX WHT (SOFTGOODS) ×3 IMPLANT
IMMOBILIZER SHDR XL LX WHT (SOFTGOODS) IMPLANT
INTRO PACEMKR SHEATH II 7FR (MISCELLANEOUS) ×3
INTRODUCER PACEMKR SHTH II 7FR (MISCELLANEOUS) ×1 IMPLANT
IPG PACE AZUR XT DR MRI W1DR01 (Pacemaker) ×1 IMPLANT
IV NS 500ML (IV SOLUTION) ×2
IV NS 500ML BAXH (IV SOLUTION) ×1 IMPLANT
KIT RM TURNOVER STRD PROC AR (KITS) ×3 IMPLANT
LABEL OR SOLS (LABEL) IMPLANT
LEAD CAPSURE NOVUS 5076-52CM (Lead) ×3 IMPLANT
LEAD CAPSURE NOVUS 5076-58CM (Lead) ×3 IMPLANT
MARKER SKIN DUAL TIP RULER LAB (MISCELLANEOUS) ×3 IMPLANT
PACE AZURE XT DR MRI W1DR01 (Pacemaker) ×3 IMPLANT
PACK PACE INSERTION (MISCELLANEOUS) ×3 IMPLANT
PAD ONESTEP ZOLL R SERIES ADT (MISCELLANEOUS) ×3 IMPLANT
SUT SILK 0 SH 30 (SUTURE) ×9 IMPLANT

## 2017-02-20 NOTE — Progress Notes (Signed)
For dual chamber ppm today for symptomatic sinus bradycardia.

## 2017-02-20 NOTE — Progress Notes (Signed)
PT Cancellation Note  Patient Details Name: Frank Bartlett MRN: [id number] DOB: 05-Feb-1926   Cancelled Treatment:    Reason Eval/Treat Not Completed: Patient at procedure or test/unavailable;Medical issues which prohibited therapy. Per medical record, pt is to undergo PPM procedure today with cardiology. Pt will need new PT order after procedure.   12:43 PM, 02/20/17 Etta Grandchild, PT, DPT Physical Therapist - [place] [phone] (mobile)   [name] C 02/20/2017, 12:43 PM

## 2017-02-20 NOTE — Op Note (Signed)
[facility]   02/19/2017 - 02/20/2017                     3:06 PM  PATIENT:  Frank Bartlett    PRE-OPERATIVE DIAGNOSIS:  bradycardia  POST-OPERATIVE DIAGNOSIS:  Same  PROCEDURE:  INSERTION PACEMAKER  SURGEON:  Isaias Cowman, MD    ANESTHESIA:     PREOPERATIVE INDICATIONS:  Frank Bartlett is a  83 y.o. male with a diagnosis of bradycardia who failed conservative measures and elected for surgical management.    The risks benefits and alternatives were discussed with the patient preoperatively including but not limited to the risks of infection, bleeding, cardiopulmonary complications, the need for revision surgery, among others, and the patient was willing to proceed.   OPERATIVE PROCEDURE: The patient was brought to the operating room the fasting state. Left pectoral region was prepped and draped in the usual sterile manner. Anesthesia was obtained 1% lidocaine locally. A 6 cm incision was performed a left pectoral region. The pacemaker pocket was generated below arch: And blunt dissection. Access was obtained to left subclavian vein by fine needle aspiration. MRI compatible leads were positioned into the right ventricular apical septum ( Medtronic [id number] ) and right atrial appendage ( Medtronic [id number] ) under fluoroscopic guidance. After proper thresholds were obtained the leads were sutured in place. The leads were connected to a MRI compatible dual-chamber rate responsive pacemaker generator ( Medtronic [id number] ). The pacemaker pocket was irrigated with gentamicin solution. The pacemaker generator was positioned into the pocket and the pocket was closed with 2-0 and 4-0 Vicryl, respectively. Steri-Strips and pressure dressing were applied. Pacemaker interrogation at the end of the procedure revealed appropriate sensing and pacing thresholds. There were no periprocedural complications.

## 2017-02-20 NOTE — Progress Notes (Signed)
Dr Ubaldo Glassing in to see pt. No c/o of pain at this time. Soft. Per MD continue to monitor and assess. No new orders at this time

## 2017-02-20 NOTE — Anesthesia Post-op Follow-up Note (Signed)
Anesthesia QCDR form completed.        

## 2017-02-20 NOTE — Progress Notes (Addendum)
Patient s/p pacemaker, site assessed, slight bruising noted just below dressing however dressing clean, dry, intact. Patient experiencing nausea, IV zofran given twice per PACU RN. Positioned upright. Will continue to reassess and monitor.

## 2017-02-20 NOTE — Transfer of Care (Signed)
Immediate Anesthesia Transfer of Care Note  Patient: Frank Bartlett  Procedure(s) Performed: INSERTION PACEMAKER (N/A Shoulder)  Patient Location: PACU  Anesthesia Type:MAC  Level of Consciousness: awake and alert   Airway & Oxygen Therapy: Patient Spontanous Breathing and Patient connected to nasal cannula oxygen  Post-op Assessment: Report given to RN and Post -op Vital signs reviewed and stable  Post vital signs: Reviewed and stable  Last Vitals:  Vitals:   02/20/17 1233 02/20/17 1301  BP: (!) 158/69 (!) 164/79  Pulse: (!) 41 (!) 48  Resp: 15   Temp: 36.8 C 36.4 C  SpO2: 98% 100%    Last Pain:  Vitals:   02/20/17 1301  TempSrc: Tympanic  PainSc: 0-No pain         Complications: No apparent anesthesia complications

## 2017-02-20 NOTE — Progress Notes (Signed)
[place] [place] NAME: Merville Hijazi    MR#:  [id number]  DATE OF BIRTH:  1925/08/23  SUBJECTIVE:   Patient denies any complaints. He is awaiting pacemaker placement. Heart rate in the 40s REVIEW OF SYSTEMS:   Review of Systems  Constitutional: Negative for chills, fever and weight loss.  HENT: Negative for ear discharge, ear pain and nosebleeds.   Eyes: Negative for blurred vision, pain and discharge.  Respiratory: Negative for sputum production, shortness of breath, wheezing and stridor.   Cardiovascular: Negative for chest pain, palpitations, orthopnea and PND.  Gastrointestinal: Negative for abdominal pain, diarrhea, nausea and vomiting.  Genitourinary: Negative for frequency and urgency.  Musculoskeletal: Negative for back pain and joint pain.  Neurological: Negative for sensory change, speech change, focal weakness and weakness.  Psychiatric/Behavioral: Negative for depression and hallucinations. The patient is not nervous/anxious.    Tolerating Diet: Tolerating PT:   DRUG ALLERGIES:  No Known Allergies  VITALS:  Blood pressure (!) 152/84, pulse (!) 42, temperature 98.3 F (36.8 C), temperature source Oral, resp. rate 18, height 5\' 9"  (1.753 m), weight 70.2 kg (154 lb 12.8 oz), SpO2 98 %.  PHYSICAL EXAMINATION:   Physical Exam  GENERAL:  83 y.o.-year-old patient lying in the bed with no acute distress.  EYES: Pupils equal, round, reactive to light and accommodation. No scleral icterus. Extraocular muscles intact.  HEENT: Head atraumatic, normocephalic. Oropharynx and nasopharynx clear.  NECK:  Supple, no jugular venous distention. No thyroid enlargement, no tenderness.  LUNGS: Normal breath sounds bilaterally, no wheezing, rales, rhonchi. No use of accessory muscles of respiration.  CARDIOVASCULAR: S1, S2 normal. No murmurs, rubs, or gallops.  ABDOMEN: Soft, nontender, nondistended. Bowel sounds present. No  organomegaly or mass.  EXTREMITIES: No cyanosis, clubbing or edema b/l.    NEUROLOGIC: Cranial nerves II through XII are intact. No focal Motor or sensory deficits b/l.   PSYCHIATRIC:  patient is alert and oriented x 3.  SKIN: No obvious rash, lesion, or ulcer.   LABORATORY PANEL:  CBC  Recent Labs Lab 02/20/17 0407  WBC 5.2  HGB 12.2*  HCT 35.0*  PLT 237    Chemistries   Recent Labs Lab 02/20/17 0407  NA 135  K 4.1  CL 103  CO2 25  GLUCOSE 100*  BUN 26*  CREATININE 1.20  CALCIUM 9.0   Cardiac Enzymes  Recent Labs Lab 02/19/17 1026  TROPONINI <0.03   RADIOLOGY:  Ct Head Wo Contrast  Result Date: 02/19/2017 CLINICAL DATA:  83 year old male with progressive weakness and dizziness. EXAM: CT HEAD WITHOUT CONTRAST TECHNIQUE: Contiguous axial images were obtained from the base of the skull through the vertex without intravenous contrast. COMPARISON:  Head CT 02/22/2016 FINDINGS: Brain: No evidence of acute infarction, hemorrhage, hydrocephalus, extra-axial collection or mass lesion/mass effect. Stable cerebral and cerebellar cortical volume loss. Mild periventricular white matter hypoattenuation most consistent with the sequelae of chronic microvascular ischemic white matter disease. Calcification along the falx cerebrum. Vascular: No hyperdense vessel or unexpected calcification. Skull: Normal. Negative for fracture or focal lesion. Sinuses/Orbits: No acute finding. Other: None. IMPRESSION: 1. No acute intracranial abnormality. 2. Stable age related involutional changes. Electronically Signed   By: Jacqulynn Cadet M.D.   On: 02/19/2017 11:55   ASSESSMENT AND PLAN:  Laurel Smeltz  is a 83 y.o. male with a known history of hypothyroidism, left renal mass status post left nephrectomy, hypertension, hyperlipidemia and known history of sinus bradycardia presents to  hospital secondary to worsening dizziness today.  #1 symptomatic bradycardia-known history of sinus  bradycardia, however very symptomatic with heart rate in the 30s when patient came to the emergency room -Heart rate today in the 40s -cardiology consult appreciated. Dr Josefa Half to place Dual chamber PM today -Not on any rate limiting medications.  #2 hypothyroidism-continue Synthroid.  #3 hypertension-continue lisinopril  #4 DVT prophylaxis-Lovenox   Case discussed with Care Management/Social Worker. Management plans discussed with the patient, family and they are in agreement.  CODE STATUS: full  DVT Prophylaxis: lovenox  TOTAL TIME TAKING CARE OF THIS PATIENT: *30* minutes.  >50% time spent on counselling and coordination of care  POSSIBLE D/C IN **1-2 DAYS, DEPENDING ON CLINICAL CONDITION.  Note: This dictation was prepared with Dragon dictation along with smaller phrase technology. Any transcriptional errors that result from this process are unintentional.  Phoenix Dresser M.D on 02/20/2017 at 12:02 PM  Between 7am to 6pm - Pager - [phone]  After 6pm go to [email or web address] - password [email or web address] [facility]  Office  [phone]  CC: Primary care physician; Leonel Ramsay, MDPatient ID: [name], male   DOB: 01/06/1926, 83 y.o.   MRN: [id number]

## 2017-02-20 NOTE — Anesthesia Postprocedure Evaluation (Signed)
Anesthesia Post Note  Patient: Frank Bartlett  Procedure(s) Performed: INSERTION PACEMAKER (N/A Shoulder)  Patient location during evaluation: PACU Anesthesia Type: MAC Level of consciousness: awake and alert Pain management: pain level controlled Vital Signs Assessment: post-procedure vital signs reviewed and stable Respiratory status: spontaneous breathing, nonlabored ventilation, respiratory function stable and patient connected to nasal cannula oxygen Cardiovascular status: stable and blood pressure returned to baseline Postop Assessment: no apparent nausea or vomiting Anesthetic complications: no     Last Vitals:  Vitals:   02/20/17 1301 02/20/17 1508  BP: (!) 164/79 (!) 182/93  Pulse: (!) 48 60  Resp:  12  Temp: 36.4 C 36.9 C  SpO2: 100% 98%    Last Pain:  Vitals:   02/20/17 1508  TempSrc:   PainSc: 0-No pain                 Vitor Overbaugh S

## 2017-02-20 NOTE — Anesthesia Preprocedure Evaluation (Addendum)
Anesthesia Evaluation  Patient identified by MRN, date of birth, ID band Patient awake    Reviewed: Allergy & Precautions, NPO status , Patient's Chart, lab work & pertinent test results, reviewed documented beta blocker date and time   Airway Mallampati: II  TM Distance: >3 FB     Dental  (+) Chipped, Missing   Pulmonary           Cardiovascular hypertension, Pt. on medications + dysrhythmias Atrial Fibrillation      Neuro/Psych    GI/Hepatic   Endo/Other  Hypothyroidism   Renal/GU Renal disease     Musculoskeletal  (+) Arthritis ,   Abdominal   Peds  Hematology  (+) anemia ,   Anesthesia Other Findings Renal Ca with nephrectomy.Prostate Ca with seeds.protruding front teeth.  Reproductive/Obstetrics                            Anesthesia Physical Anesthesia Plan  ASA: II  Anesthesia Plan: MAC   Post-op Pain Management:    Induction:   PONV Risk Score and Plan:   Airway Management Planned:   Additional Equipment:   Intra-op Plan:   Post-operative Plan:   Informed Consent: I have reviewed the patients History and Physical, chart, labs and discussed the procedure including the risks, benefits and alternatives for the proposed anesthesia with the patient or authorized representative who has indicated his/her understanding and acceptance.     Plan Discussed with: CRNA  Anesthesia Plan Comments:         Anesthesia Quick Evaluation

## 2017-02-20 NOTE — Progress Notes (Signed)
Soft bruising noted directly below dressing, dressing remains clean dry and intact. Bruising marked, MD paged, VSS. No c/o pain

## 2017-02-21 ENCOUNTER — Encounter: Payer: Self-pay | Admitting: Cardiology

## 2017-02-21 MED ORDER — AMLODIPINE BESYLATE 5 MG PO TABS: 5.0000 mg | ORAL_TABLET | Freq: Every day | ORAL | 0 refills | Status: DC

## 2017-02-21 MED ORDER — CEPHALEXIN 500 MG PO CAPS
500.0000 mg | ORAL_CAPSULE | Freq: Two times a day (BID) | ORAL | Status: DC
  Administered 2017-02-21: 500 mg via ORAL
  Filled 2017-02-21: qty 1

## 2017-02-21 MED ORDER — CEPHALEXIN 500 MG PO CAPS: 500.0000 mg | ORAL_CAPSULE | Freq: Two times a day (BID) | ORAL | 0 refills | Status: DC

## 2017-02-21 NOTE — NC FL2 (Signed)
[place] LEVEL OF CARE SCREENING TOOL     IDENTIFICATION  Patient Name: Frank Bartlett Birthdate: 1926-04-18 Sex: male Admission Date (Current Location): 02/19/2017  [place] and [place] Number:  Engineering geologist and Address:  [facility], [address] [place], [place] [address]      Provider Number: [id number]  Attending Physician Name and Address:  Fritzi Mandes, MD  Relative Name and Phone Number:  Stefen, Juba [phone]  [phone] or Teddrick, Mallari [phone]  [phone] or Kathee Polite Daughter [phone]  [phone]     Current Level of Care: Hospital Recommended Level of Care: [place] Prior Approval Number:    Date Approved/Denied:   PASRR Number: [id number]  Discharge Plan: SNF    Current Diagnoses: Patient Active Problem List   Diagnosis Date Noted  . Bradycardia 02/19/2017  . Renal cell cancer, left ([place]) 03/26/2016  . Pneumoperitoneum 03/13/2016  . Bowel perforation ([place])   . Generalized abdominal pain   . Atrial fibrillation ([place])   . Cough   . Left renal mass 03/08/2016  . Acute renal failure ([place]) 07/01/2015  . C. difficile colitis 07/01/2015  . Generalized weakness 07/01/2015  . Hypokalemia 07/01/2015  . Elevated transaminase level 07/01/2015  . Leukocytosis 07/01/2015  . Anemia 07/01/2015  . Hyponatremia 06/29/2015    Orientation RESPIRATION BLADDER Height & Weight     Self, Time, Situation, Place  Normal Continent Weight: 154 lb 12.8 oz (70.2 kg) Height:  5\' 9"  (175.3 cm)  BEHAVIORAL SYMPTOMS/MOOD NEUROLOGICAL BOWEL NUTRITION STATUS      Continent Diet (Cardiac)  AMBULATORY STATUS COMMUNICATION OF NEEDS Skin   Limited Assist Verbally Surgical wounds                       Personal Care Assistance Level of Assistance  Bathing, Feeding, Dressing Bathing Assistance: Limited assistance Feeding assistance: Independent Dressing Assistance: Limited  assistance     Functional Limitations Info  Sight, Hearing, Speech Sight Info: Adequate Hearing Info: Adequate Speech Info: Adequate    SPECIAL CARE FACTORS FREQUENCY  PT (By licensed PT)     PT Frequency: 5x a week              Contractures Contractures Info: Not present    Additional Factors Info  Code Status Code Status Info: Full Code             Current Medications (02/21/2017):  This is the current hospital active medication list Current Facility-Administered Medications  Medication Dose Route Frequency Provider Last Rate Last Dose  . acetaminophen (TYLENOL) tablet 650 mg  650 mg Oral Q6H PRN Gladstone Lighter, MD   650 mg at 02/20/17 2356   Or  . acetaminophen (TYLENOL) suppository 650 mg  650 mg Rectal Q6H PRN Gladstone Lighter, MD      . acetaminophen (TYLENOL) tablet 325-650 mg  325-650 mg Oral Q4H PRN Paraschos, Alexander, MD      . amLODipine (NORVASC) tablet 5 mg  5 mg Oral Daily Teodoro Spray, MD   5 mg at 02/21/17 0911  . cephALEXin (KEFLEX) capsule 500 mg  500 mg Oral Q12H Fritzi Mandes, MD   500 mg at 02/21/17 1029  . enoxaparin (LOVENOX) injection 40 mg  40 mg Subcutaneous Q24H Gladstone Lighter, MD   40 mg at 02/20/17 2132  . feeding supplement (ENSURE ENLIVE) (ENSURE ENLIVE) liquid 237 mL  237 mL Oral BID WC Gladstone Lighter, MD   237 mL at  02/21/17 0913  . levothyroxine (SYNTHROID, LEVOTHROID) tablet 112 mcg  112 mcg Oral QAC breakfast Gladstone Lighter, MD   112 mcg at 02/21/17 0536  . lisinopril (PRINIVIL,ZESTRIL) tablet 20 mg  20 mg Oral Daily Gladstone Lighter, MD   20 mg at 02/21/17 0911  . multivitamin with minerals tablet 1 tablet  1 tablet Oral Daily Gladstone Lighter, MD   1 tablet at 02/21/17 0912  . ondansetron (ZOFRAN) tablet 4 mg  4 mg Oral Q6H PRN Gladstone Lighter, MD       Or  . ondansetron (ZOFRAN) injection 4 mg  4 mg Intravenous Q6H PRN Gladstone Lighter, MD      . ondansetron (ZOFRAN) injection 4 mg  4 mg Intravenous  Q6H PRN Paraschos, Alexander, MD      . vitamin B-12 (CYANOCOBALAMIN) tablet 1,000 mcg  1,000 mcg Oral Daily Gladstone Lighter, MD   1,000 mcg at 02/21/17 7741     Discharge Medications: Please see discharge summary for a list of discharge medications.  Relevant Imaging Results:  Relevant Lab Results:   Additional Information SSN [id number]  Ross Ludwig, [place]

## 2017-02-21 NOTE — Progress Notes (Addendum)
Continued to monitor pacemaker site. Some soft bruising down axillary. Site marked. Pt with no complaints of pain throughout the evening VSS. Dressing dry and intact.

## 2017-02-21 NOTE — Progress Notes (Signed)
Pt expressing concerns with going home alone and how he is going to take care of himself.

## 2017-02-21 NOTE — Care Management Important Message (Signed)
Important Message  Patient Details  Name: CORMAC WINT MRN: [id number] Date of Birth: 1925-06-12   Medicare Important Message Given:  Yes    Shelbie Ammons, RN 02/21/2017, 9:50 AM

## 2017-02-21 NOTE — Discharge Summary (Signed)
Frank Bartlett    MR#:  [id number]  DATE OF BIRTH:  09-23-1925  DATE OF ADMISSION:  02/19/2017 ADMITTING PHYSICIAN: Gladstone Lighter, MD  DATE OF DISCHARGE: 02/21/2017  PRIMARY CARE PHYSICIAN: Leonel Ramsay, MD    ADMISSION DIAGNOSIS:  Dizziness [R42] Symptomatic bradycardia [R00.1]  DISCHARGE DIAGNOSIS:  Symptomatic Bradycardia--s/p Pacemaker placement 02/20/17  SECONDARY DIAGNOSIS:   Past Medical History:  Diagnosis Date  . B12 deficiency   . Bradycardia   . Hyperlipidemia   . Hypertension   . Hypothyroidism   . Osteoarthritis   . Prostate cancer ([place]) 2001   Rad tx's + seed implants  . Renal cancer, left ([place]) 03/2016   Left Renal Nephrectomy    HOSPITAL COURSE:   Frank Bartlett a 83 y.o. malewith a known history of hypothyroidism, left renal mass status post left nephrectomy, hypertension, hyperlipidemia and known history of sinus bradycardia presents to hospital secondary to worsening dizziness today.  #1 symptomatic bradycardia-known history of sinus bradycardia, however very symptomatic with heart rate in the 30s when patient came to the emergency room -Heart rate today in the 40s -cardiology consult appreciated. -Dual chamber PM placed on 02/20/17 by dr Josefa Half -Not on any rate limiting medications. -empiric keflex HR paced 60's  #2 hypothyroidism-continue Synthroid.  #3 hypertension-continue lisinopril  #4 DVT prophylaxis-Lovenox  PT to see pt HH vs rehab D/c later today CONSULTS OBTAINED:  Treatment Team:  Teodoro Spray, MD Isaias Cowman, MD  DRUG ALLERGIES:  No Known Allergies  DISCHARGE MEDICATIONS:   Current Discharge Medication List    START taking these medications   Details  amLODipine (NORVASC) 5 MG tablet Take 1 tablet (5 mg total) by mouth daily. Qty: 30 tablet, Refills: 0    cephALEXin (KEFLEX) 500 MG capsule Take 1  capsule (500 mg total) by mouth every 12 (twelve) hours. Qty: 12 capsule, Refills: 0      CONTINUE these medications which have NOT CHANGED   Details  aspirin EC 81 MG tablet Take 81 mg by mouth daily.     Cyanocobalamin (RA VITAMIN B-12 TR) 1000 MCG TBCR Take 1,000 mcg by mouth daily.     levothyroxine (SYNTHROID, LEVOTHROID) 112 MCG tablet Take 112 mcg by mouth daily before breakfast.    lisinopril (PRINIVIL,ZESTRIL) 20 MG tablet Take by mouth.    Multiple Vitamin (MULTI-VITAMINS) TABS Take by mouth.    feeding supplement, ENSURE ENLIVE, (ENSURE ENLIVE) LIQD Take 237 mLs by mouth 2 (two) times daily with a meal. Qty: 237 mL, Refills: 12        If you experience worsening of your admission symptoms, develop shortness of breath, life threatening emergency, suicidal or homicidal thoughts you must seek medical attention immediately by calling 911 or calling your MD immediately  if symptoms less severe.  You Must read complete instructions/literature along with all the possible adverse reactions/side effects for all the Medicines you take and that have been prescribed to you. Take any new Medicines after you have completely understood and accept all the possible adverse reactions/side effects.   Please note  You were cared for by a hospitalist during your hospital stay. If you have any questions about your discharge medications or the care you received while you were in the hospital after you are discharged, you can call the unit and asked to speak with the hospitalist on call if the hospitalist that took care of you is not available. Once  you are discharged, your primary care physician will handle any further medical issues. Please note that NO REFILLS for any discharge medications will be authorized once you are discharged, as it is imperative that you return to your primary care physician (or establish a relationship with a primary care physician if you do not have one) for your aftercare  needs so that they can reassess your need for medications and monitor your lab values. Today   SUBJECTIVE   Doing well  VITAL SIGNS:  Blood pressure 131/73, pulse 60, temperature 98.2 F (36.8 C), temperature source Oral, resp. rate 17, height 5\' 9"  (1.753 m), weight 70.2 kg (154 lb 12.8 oz), SpO2 98 %.  I/O:   Intake/Output Summary (Last 24 hours) at 02/21/17 1159 Last data filed at 02/21/17 0945  Gross per 24 hour  Intake              910 ml  Output              400 ml  Net              510 ml    PHYSICAL EXAMINATION:  GENERAL:  83 y.o.-year-old patient lying in the bed with no acute distress.  EYES: Pupils equal, round, reactive to light and accommodation. No scleral icterus. Extraocular muscles intact.  HEENT: Head atraumatic, normocephalic. Oropharynx and nasopharynx clear.  NECK:  Supple, no jugular venous distention. No thyroid enlargement, no tenderness.  LUNGS: Normal breath sounds bilaterally, no wheezing, rales,rhonchi or crepitation. No use of accessory muscles of respiration. Left upper chest redness with mild tenderness. PM site no discharge CARDIOVASCULAR: S1, S2 normal. No murmurs, rubs, or gallops.  ABDOMEN: Soft, non-tender, non-distended. Bowel sounds present. No organomegaly or mass.  EXTREMITIES: No pedal edema, cyanosis, or clubbing.  NEUROLOGIC: Cranial nerves II through XII are intact. Muscle strength 5/5 in all extremities. Sensation intact. Gait not checked.  PSYCHIATRIC: The patient is alert and oriented x 3.  SKIN: No obvious rash, lesion, or ulcer.   DATA REVIEW:   CBC   Recent Labs Lab 02/20/17 0407  WBC 5.2  HGB 12.2*  HCT 35.0*  PLT 237    Chemistries   Recent Labs Lab 02/20/17 0407  NA 135  K 4.1  CL 103  CO2 25  GLUCOSE 100*  BUN 26*  CREATININE 1.20  CALCIUM 9.0    Microbiology Results   Recent Results (from the past 240 hour(s))  MRSA PCR Screening     Status: None   Collection Time: 02/19/17  2:34 PM  Result Value  Ref Range Status   MRSA by PCR NEGATIVE NEGATIVE Final    Comment:        The GeneXpert MRSA Assay (FDA approved for NASAL specimens only), is one component of a comprehensive MRSA colonization surveillance program. It is not intended to diagnose MRSA infection nor to guide or monitor treatment for MRSA infections.     RADIOLOGY:  Dg Chest Port 1 View  Result Date: 02/20/2017 CLINICAL DATA:  Status post pacemaker placement. EXAM: PORTABLE CHEST 1 VIEW COMPARISON:  Radiograph July 09, 2016. FINDINGS: The heart size and mediastinal contours are within normal limits. No pneumothorax or pleural effusion is noted. Atherosclerosis thoracic aorta is noted. Interval placement of left-sided pacemaker with leads in grossly good position. Both lungs are clear. The visualized skeletal structures are unremarkable. IMPRESSION: Interval placement of left-sided pacemaker with leads in grossly good position. Aortic atherosclerosis. No acute cardiopulmonary abnormality seen. Electronically Signed  By: Marijo Conception, M.D.   On: 02/20/2017 15:39   Dg C-arm 1-60 Min-no Report  Result Date: 02/20/2017 Fluoroscopy was utilized by the requesting physician.  No radiographic interpretation.     Management plans discussed with the patient, family and they are in agreement.  CODE STATUS:     Code Status Orders        Start     Ordered   02/19/17 1339  Full code  Continuous     02/19/17 1338    Code Status History    Date Active Date Inactive Code Status Order ID Comments User Context   03/13/2016  7:15 AM 03/17/2016  8:44 PM DNR [id number]  Festus Aloe, MD Inpatient   03/08/2016  2:04 PM 03/10/2016  9:06 PM Full Code [id number]  Nickie Retort, MD Inpatient   06/29/2015 12:07 PM 07/02/2015  6:01 PM Full Code [id number]  Gladstone Lighter, MD Inpatient    Advance Directive Documentation     Most Recent Value  Type of Advance Directive  Out of facility DNR (pink MOST or yellow form) [not  brought with EMS,  at patient's house]  Pre-existing out of facility DNR order (yellow form or pink MOST form)  -  "MOST" Form in Place?  -      TOTAL TIME TAKING CARE OF THIS PATIENT: *40* minutes.    Kaiyah Eber M.D on 02/21/2017 at 11:59 AM  Between 7am to 6pm - Pager - [phone] After 6pm go to [email or web address] - password [email or web address] [place] [facility]  Office  [phone]  CC: Primary care physician; Leonel Ramsay, MD

## 2017-02-21 NOTE — Evaluation (Signed)
Physical Therapy Evaluation Patient Details Name: Frank Bartlett MRN: [id number] DOB: 1925/06/23 Today's Date: 02/21/2017   History of Present Illness  83 y/o male here with bradycardia, had pacemaker placed 10/17.  Clinical Impression  Pt did well with getting up to sitting, standing and then ambulating around the nurses' station.  He did not need to use L UE excessively on walker (uses rollator out of the house, SPC in the home) but did need it and at times appeared to use it more than he was indicating.  Pt is typically independent but given L UE limitations would have a hard time managing with just the R UE.  Pt should be able to return home relatively soon, but will need increased assist with ADLs, errands, etc until such time.      Follow Up Recommendations SNF    Equipment Recommendations  Rolling walker with 5" wheels    Recommendations for Other Services       Precautions / Restrictions Precautions Precautions: Fall Restrictions Weight Bearing Restrictions: Yes LUE Weight Bearing:  (pace maker precautions)      Mobility  Bed Mobility Overal bed mobility: Modified Independent             General bed mobility comments: Pt needed heavy use of rail (R UE only, has rail at home)  Transfers Overall transfer level: Modified independent Equipment used: Rolling walker (2 wheeled)             General transfer comment: Pt is able to rise with heavy R UE use (and moderate L UE use), no LOBs  Ambulation/Gait Ambulation/Gait assistance: Min guard Ambulation Distance (Feet): 200 Feet Assistive device: Rolling walker (2 wheeled)       General Gait Details: Pt was able to maintain slow but consistent cadence, needed frequent reminders to decrease L UE use on rail.    Stairs            Wheelchair Mobility    Modified Rankin (Stroke Patients Only)       Balance Overall balance assessment: Modified Independent                                            Pertinent Vitals/Pain Pain Assessment:  (only general L chest/shoulder soreness)    Home Living Family/patient expects to be discharged to:: Skilled nursing facility Living Arrangements: Alone             Home Equipment: Walker - 4 wheels;Cane - single point;Grab bars - toilet (bed rail) Additional Comments: Pt lost wife earlier this year, living alone, hopes to do a few days of resp[facility] at [facility]    Prior Function Level of Independence: Independent         Comments: Until recently he had apparently been able to walk for >1 mile for exercise, ran errands, and was generally active     Hand Dominance        Extremity/Trunk Assessment   Upper Extremity Assessment Upper Extremity Assessment: Overall WFL for tasks assessed (L UE not tested secondary to pacer)    Lower Extremity Assessment Lower Extremity Assessment: Overall WFL for tasks assessed;Generalized weakness       Communication   Communication: No difficulties  Cognition Arousal/Alertness: Awake/alert Behavior During Therapy: WFL for tasks assessed/performed Overall Cognitive Status: Within Functional Limits for tasks assessed  General Comments      Exercises     Assessment/Plan    PT Assessment Patient needs continued PT services  PT Problem List Decreased strength;Decreased range of motion;Decreased balance;Decreased activity tolerance;Decreased coordination;Decreased mobility;Decreased cognition;Decreased safety awareness       PT Treatment Interventions DME instruction;Gait training;Functional mobility training;Therapeutic activities;Therapeutic exercise;Balance training;Patient/family education    PT Goals (Current goals can be found in the Care Plan section)  Acute Rehab PT Goals Patient Stated Goal: go home after the L arm is more functional PT Goal Formulation: With patient Time For Goal Achievement:  03/07/17 Potential to Achieve Goals: Good    Frequency Min 2X/week   Barriers to discharge Decreased caregiver support      Co-evaluation               AM-PAC PT "6 Clicks" Daily Activity  Outcome Measure Difficulty turning over in bed (including adjusting bedclothes, sheets and blankets)?: A Little Difficulty moving from lying on back to sitting on the side of the bed? : A Little Difficulty sitting down on and standing up from a chair with arms (e.g., wheelchair, bedside commode, etc,.)?: A Little Help needed moving to and from a bed to chair (including a wheelchair)?: A Little Help needed walking in hospital room?: A Little Help needed climbing 3-5 steps with a railing? : A Lot 6 Click Score: 17    End of Session Equipment Utilized During Treatment: Gait belt Activity Tolerance: Patient limited by fatigue Patient left: with call bell/phone within reach;with bed alarm set Nurse Communication: Mobility status PT Visit Diagnosis: Muscle weakness (generalized) (M62.81);Difficulty in walking, not elsewhere classified (R26.2)    Time: 7564-3329 PT Time Calculation (min) (ACUTE ONLY): 17 min   Charges:   PT Evaluation $PT Eval Low Complexity: 1 Low     PT G Codes:        Kreg Shropshire, DPT 02/21/2017, 2:56 PM

## 2017-02-21 NOTE — Progress Notes (Signed)
       [place]  SUBJECTIVE: No complaints   Vitals:   02/20/17 1553 02/20/17 1915 02/20/17 1939 02/21/17 0427  BP: (!) 146/79 (!) 118/94 (!) 147/92 119/67  Pulse: 60 84 90 60  Resp: 14 17  17   Temp: 97.8 F (36.6 C) 98 F (36.7 C) 98.1 F (36.7 C) 97.7 F (36.5 C)  TempSrc:  Oral  Oral  SpO2: 98% 96% 92% 98%  Weight:      Height:        Intake/Output Summary (Last 24 hours) at 02/21/17 0723 Last data filed at 02/21/17 0500  Gross per 24 hour  Intake              610 ml  Output              400 ml  Net              210 ml    LABS: Basic Metabolic Panel:  Recent Labs  02/19/17 1026 02/20/17 0407  NA 133* 135  K 4.3 4.1  CL 98* 103  CO2 25 25  GLUCOSE 97 100*  BUN 22* 26*  CREATININE 1.33* 1.20  CALCIUM 9.5 9.0   Liver Function Tests: No results for input(s): AST, ALT, ALKPHOS, BILITOT, PROT, ALBUMIN in the last 72 hours. No results for input(s): LIPASE, AMYLASE in the last 72 hours. CBC:  Recent Labs  02/19/17 1026 02/20/17 0407  WBC 3.9 5.2  HGB 12.9* 12.2*  HCT 36.7* 35.0*  MCV 92.2 92.4  PLT 254 237   Cardiac Enzymes:  Recent Labs  02/19/17 1026  TROPONINI <0.03   BNP: Invalid input(s): POCBNP D-Dimer: No results for input(s): DDIMER in the last 72 hours. Hemoglobin A1C: No results for input(s): HGBA1C in the last 72 hours. Fasting Lipid Panel: No results for input(s): CHOL, HDL, LDLCALC, TRIG, CHOLHDL, LDLDIRECT in the last 72 hours. Thyroid Function Tests: No results for input(s): TSH, T4TOTAL, T3FREE, THYROIDAB in the last 72 hours.  Invalid input(s): FREET3 Anemia Panel: No results for input(s): VITAMINB12, FOLATE, FERRITIN, TIBC, IRON, RETICCTPCT in the last 72 hours.   Physical Exam: Blood pressure 119/67, pulse 60, temperature 97.7 F (36.5 C), temperature source Oral, resp. rate 17, height 5\' 9"  (1.753 m), weight 70.2 kg (154 lb 12.8 oz), SpO2 98 %.   Wt Readings from Last  1 Encounters:  02/19/17 70.2 kg (154 lb 12.8 oz)     General appearance: alert and cooperative Resp: clear to auscultation bilaterally Chest wall: no tenderness Cardio: Paced rhythm GI: soft, non-tender; bowel sounds normal; no masses,  no organomegaly Extremities: extremities normal, atraumatic, no cyanosis or edema Neurologic: Grossly normal  TELEMETRY: Reviewed telemetry pt in atrial paced with ventricular sensed rhythm with prolonged PR interval:  ASSESSMENT AND PLAN:  Active Problems:   Bradycardia-status post permanent pacemaker with a dual-chamber pacemaker. Currently atrial paced ventricular sensed. Does have some puffiness at the pacemaker site. No tenderness. Soft. Device appears to be functioning normally. We'll continue to follow. Would ambulate and consider discharge today with follow-up as an outpatient.    Teodoro Spray, MD, [facility] 02/21/2017 7:23 AM

## 2017-02-21 NOTE — Clinical Social Work Note (Signed)
Patient to be d/c'ed today to [facility].  Patient and family agreeable to plans will transport via ems RN to call report room [phone].  Evette Cristal, MSW, [place]

## 2017-02-21 NOTE — Progress Notes (Signed)
Pt. Discharged to [facility] via wheelchair, and [place]. Pt. Educated and report called to [employer] @ [place]. Pt.  Has no complaints or questions at this time VSS, informed Raliegh Scarlet of pt. Current incision site status, red, and swollen, however Dr. Ubaldo Glassing, and Posey Pronto, have assessed and decided progress to oral antibiotic therapy at this time.

## 2017-02-22 NOTE — Clinical Social Work Placement (Signed)
   CLINICAL SOCIAL WORK PLACEMENT  NOTE  Date:  02/22/2017  Patient Details  Name: Frank Bartlett MRN: [id number] Date of Birth: 10-15-1925  Clinical Social Work is seeking post-discharge placement for this patient at the [place] level of care (*CSW will initial, date and re-position this form in  chart as items are completed):  Yes   Patient/family provided with [place] Work Department's list of facilities offering this level of care within the geographic area requested by the patient (or if unable, by the patient's family).  Yes   Patient/family informed of their freedom to choose among providers that offer the needed level of care, that participate in Medicare, Medicaid or managed care program needed by the patient, have an available bed and are willing to accept the patient.  Yes   Patient/family informed of [facility]'s ownership interest in [facility] and [facility], as well as of the fact that they are under no obligation to receive care at these facilities.  PASRR submitted to EDS on 02/21/17     PASRR number received on       Existing PASRR number confirmed on 02/21/17     FL2 transmitted to all facilities in geographic area requested by pt/family on 02/21/17     FL2 transmitted to all facilities within larger geographic area on       Patient informed that his/her managed care company has contracts with or will negotiate with certain facilities, including the following:        Yes   Patient/family informed of bed offers received.  Patient chooses bed at [facility]     Physician recommends and patient chooses bed at      Patient to be transferred to [facility] on 02/21/17.  Patient to be transferred to facility by Neighbor's car     Patient family notified on 02/21/17 of transfer.  Name of family member notified:  Patient notified his neighbor and family.     PHYSICIAN Please sign FL2     Additional Comment:     _______________________________________________ Ross Ludwig, LCSWA 02/22/2017, 7:23 PM

## 2017-02-22 NOTE — Clinical Social Work Note (Signed)
Clinical Social Work Assessment  Patient Details  Name: Frank Bartlett MRN: [id number] Date of Birth: 10/07/1925  Date of referral:  02/21/17               Reason for consult:  Facility Placement                Permission sought to share information with:  Family Supports, Customer service manager Permission granted to share information::  Yes, Verbal Permission Granted  Name::     Frank, Bartlett [phone]  [phone]   Agency::  SNF admissions  Relationship::     Contact Information:     Housing/Transportation Living arrangements for the past 2 months:  [place] of Information:  Patient Patient Interpreter Needed:  None Criminal Activity/Legal Involvement Pertinent to Current Situation/Hospitalization:  No - Comment as needed Significant Relationships:  Adult Children, Other Family Members, Neighbor Lives with:  Self Do you feel safe going back to the place where you live?  No Need for family participation in patient care:  No (Coment)  Care giving concerns:  Patient wants to go to SNF for short term rehab.   Social Worker assessment / plan:  Patient is an 83 year old male who lives at [place].  Patient is alert and oriented x4, patient states he has been to [facility] in the past and is familiar with the process.  Patient states that he feels like he needs a few days of therapy before he is able to return back home.  Patient, states that he knows what to expect at SNF.  Patient was explained role of CSW and process for making referral.  Patient gave CSW permission to contact [facility] to work on placement for rehab.  Patient did not have any other questions or concerns.  Employment status:  Retired Nurse, adult PT Recommendations:  [place] / Referral to community resources:  [place]  Patient/Family's Response to care:  Patient in  agreement to going to SNF.  Patient/Family's Understanding of and Emotional Response to Diagnosis, Current Treatment, and Prognosis:  Patient expressed he is hopeful he will not have to be at [facility] for very long.  Emotional Assessment Appearance:  Appears stated age Attitude/Demeanor/Rapport:    Affect (typically observed):  Calm, Appropriate, Pleasant Orientation:  Oriented to Place, Oriented to  Time, Oriented to Situation, Oriented to Self Alcohol / Substance use:  Not Applicable Psych involvement (Current and /or in the community):  No (Comment)  Discharge Needs  Concerns to be addressed:  Lack of Support Readmission within the last 30 days:  No Current discharge risk:  Lack of support system, Lives alone Barriers to Discharge:  No Barriers Identified   Frank Bartlett 02/22/2017, 7:19 PM

## 2017-02-26 DIAGNOSIS — I10 Essential (primary) hypertension: Secondary | ICD-10-CM | POA: Diagnosis not present

## 2017-02-26 DIAGNOSIS — C652 Malignant neoplasm of left renal pelvis: Secondary | ICD-10-CM | POA: Diagnosis not present

## 2017-02-26 DIAGNOSIS — E039 Hypothyroidism, unspecified: Secondary | ICD-10-CM | POA: Diagnosis not present

## 2017-02-26 DIAGNOSIS — R001 Bradycardia, unspecified: Secondary | ICD-10-CM | POA: Diagnosis not present

## 2017-02-28 DIAGNOSIS — Z950 Presence of cardiac pacemaker: Secondary | ICD-10-CM | POA: Insufficient documentation

## 2017-04-12 ENCOUNTER — Ambulatory Visit
Admission: RE | Admit: 2017-04-12 | Discharge: 2017-04-12 | Disposition: A | Discharge status: Home / Self Care | Payer: Medicare Other | Source: Ambulatory Visit | Attending: Urology | Admitting: Urology

## 2017-04-12 DIAGNOSIS — N281 Cyst of kidney, acquired: Secondary | ICD-10-CM | POA: Insufficient documentation

## 2017-04-12 DIAGNOSIS — Z905 Acquired absence of kidney: Secondary | ICD-10-CM | POA: Diagnosis not present

## 2017-04-12 DIAGNOSIS — Z8553 Personal history of malignant neoplasm of renal pelvis: Secondary | ICD-10-CM | POA: Diagnosis not present

## 2017-04-12 DIAGNOSIS — K439 Ventral hernia without obstruction or gangrene: Secondary | ICD-10-CM | POA: Insufficient documentation

## 2017-04-12 DIAGNOSIS — I714 Abdominal aortic aneurysm, without rupture: Secondary | ICD-10-CM | POA: Insufficient documentation

## 2017-04-12 DIAGNOSIS — Z08 Encounter for follow-up examination after completed treatment for malignant neoplasm: Secondary | ICD-10-CM | POA: Diagnosis not present

## 2017-04-12 DIAGNOSIS — C642 Malignant neoplasm of left kidney, except renal pelvis: Secondary | ICD-10-CM

## 2017-04-12 MED ORDER — IOPAMIDOL (ISOVUE-370) INJECTION 76%
80.0000 mL | Freq: Once | INTRAVENOUS | Status: AC | PRN
  Administered 2017-04-12: 80 mL via INTRAVENOUS

## 2017-04-15 ENCOUNTER — Other Ambulatory Visit: Payer: Medicare Other

## 2017-04-17 ENCOUNTER — Other Ambulatory Visit: Payer: Self-pay

## 2017-04-17 ENCOUNTER — Other Ambulatory Visit: Payer: Medicare Other

## 2017-04-17 DIAGNOSIS — C61 Malignant neoplasm of prostate: Secondary | ICD-10-CM

## 2017-04-17 DIAGNOSIS — C649 Malignant neoplasm of unspecified kidney, except renal pelvis: Secondary | ICD-10-CM

## 2017-04-17 DIAGNOSIS — C642 Malignant neoplasm of left kidney, except renal pelvis: Secondary | ICD-10-CM

## 2017-04-18 ENCOUNTER — Encounter: Payer: Self-pay | Admitting: Urology

## 2017-04-18 ENCOUNTER — Ambulatory Visit: Payer: Medicare Other | Admitting: Urology

## 2017-04-18 ENCOUNTER — Other Ambulatory Visit: Payer: Self-pay

## 2017-04-18 VITALS — BP 135/85 | HR 79 | Ht 69.0 in | Wt 164.8 lb

## 2017-04-18 DIAGNOSIS — C642 Malignant neoplasm of left kidney, except renal pelvis: Secondary | ICD-10-CM | POA: Diagnosis not present

## 2017-04-18 LAB — COMPREHENSIVE METABOLIC PANEL
ALBUMIN: 4.1 g/dL (ref 3.5–4.7)
ALT: 10 IU/L (ref 0–44)
AST: 19 IU/L (ref 0–40)
Albumin/Globulin Ratio: 1.4 (ref 1.2–2.2)
Alkaline Phosphatase: 89 IU/L (ref 39–117)
BUN/Creatinine Ratio: 13 (ref 10–24)
BUN: 18 mg/dL (ref 8–27)
Bilirubin Total: 0.2 mg/dL (ref 0.0–1.2)
CALCIUM: 9.2 mg/dL (ref 8.6–10.2)
CO2: 23 mmol/L (ref 20–29)
CREATININE: 1.41 mg/dL — AB (ref 0.76–1.27)
Chloride: 99 mmol/L (ref 96–106)
GFR calc Af Amer: 51 mL/min/{1.73_m2} — ABNORMAL LOW (ref >59)
GFR, EST NON AFRICAN AMERICAN: 44 mL/min/{1.73_m2} — AB (ref >59)
GLOBULIN, TOTAL: 3 g/dL (ref 1.5–4.5)
GLUCOSE: 115 mg/dL — AB (ref 65–99)
Potassium: 4.8 mmol/L (ref 3.5–5.2)
SODIUM: 136 mmol/L (ref 134–144)
Total Protein: 7.1 g/dL (ref 6.0–8.5)

## 2017-04-18 LAB — CBC
Hematocrit: 33.9 % — ABNORMAL LOW (ref 37.5–51.0)
Hemoglobin: 11 g/dL — ABNORMAL LOW (ref 13.0–17.7)
MCH: 30.6 pg (ref 26.6–33.0)
MCHC: 32.4 g/dL (ref 31.5–35.7)
MCV: 94 fL (ref 79–97)
Platelets: 293 10*3/uL (ref 150–379)
RBC: 3.6 x10E6/uL — ABNORMAL LOW (ref 4.14–5.80)
RDW: 14.1 % (ref 12.3–15.4)
WBC: 6.6 10*3/uL (ref 3.4–10.8)

## 2017-04-18 LAB — PSA

## 2017-04-18 NOTE — Progress Notes (Signed)
04/18/2017 10:49 AM   Frank Bartlett 12/11/1925 [id number]  Referring provider: Leonel Ramsay, MD [place],  [id number]  Chief Complaint  Patient presents with  . Renal Cancer    HPI: 83 year old male who was seen today for follow-up of his renal cell carcinoma. He underwent a laparoscopic hand-assisted left radical nephrectomy. His postoperative course was complicated by C. difficile. In addition, the patient also underwent exploratory laparotomy a week postop after he presented with hypotension and A. fib. This was a negative exploration.  11/17: - T3a-renal cell carcinoma, chromophobe type. Tumor was invading into the renal vein as well as the collecting system. Margins were negative. 3/18: Chest x-ray negative for metastatic disease. 6/18: Negative CT abd/pelvis for malignancy. Punctate right renal stone. 3.5 cm AAA with recommendation for f/u u/s in 2 years (Pt to f/u with PCP regarding this) Cr stable at 1.43. 12/18. Negative CT abd/pelvis for malignancy. Cr 1.2 10/18. Cr 1.4 12/18 (shortly after contrast load). CXR 10/18 without sign of malignancy.  He also has a history of prostate cancer.  He underwent brachytherapy.  His PSA is undetectable.  Interval: The patient is doing well at this time.  He has no unexpected weight loss.  He has no pain at this time.  He does report a hernia of his midline incision.  It does not bother him.  He is passing gas and having bowel movements.  No pain at hernia site.      PMH: Past Medical History:  Diagnosis Date  . B12 deficiency   . Bradycardia   . Hyperlipidemia   . Hypertension   . Hypothyroidism   . Osteoarthritis   . Prostate cancer ([place]) 2001   Rad tx's + seed implants  . Renal cancer, left ([place]) 03/2016   Left Renal Nephrectomy    Surgical History: Past Surgical History:  Procedure Laterality Date  . CATARACT EXTRACTION, BILATERAL    . COLONOSCOPY    . EXCISIONAL HEMORRHOIDECTOMY     . INSERTION PROSTATE RADIATION SEED    . LAPAROSCOPIC NEPHRECTOMY, HAND ASSISTED Left 03/08/2016   Procedure: HAND ASSISTED LAPAROSCOPIC NEPHRECTOMY;  Surgeon: Nickie Retort, MD;  Location: [facility] ORS;  Service: Urology;  Laterality: Left;  . LAPAROTOMY N/A 03/13/2016   Procedure: EXPLORATORY LAPAROTOMY;  Surgeon: Festus Aloe, MD;  Location: [facility] ORS;  Service: Urology;  Laterality: N/A;  . PACEMAKER INSERTION N/A 02/20/2017   Procedure: INSERTION PACEMAKER;  Surgeon: Isaias Cowman, MD;  Location: [facility] ORS;  Service: Cardiovascular;  Laterality: N/A;    Home Medications:  Allergies as of 04/18/2017   No Known Allergies     Medication List        Accurate as of 04/18/17 10:49 AM. Always use your most recent med list.          amLODipine 5 MG tablet Commonly known as:  NORVASC Take 1 tablet (5 mg total) by mouth daily.   aspirin EC 81 MG tablet Take 81 mg by mouth daily.   feeding supplement (ENSURE ENLIVE) Liqd Take 237 mLs by mouth 2 (two) times daily with a meal.   levothyroxine 112 MCG tablet Commonly known as:  SYNTHROID, LEVOTHROID Take 112 mcg by mouth daily before breakfast.   lisinopril 20 MG tablet Commonly known as:  PRINIVIL,ZESTRIL Take by mouth.   MULTI-VITAMINS Tabs Take by mouth.   RA VITAMIN B-12 TR 1000 MCG Tbcr Generic drug:  Cyanocobalamin Take 1,000 mcg by mouth daily.  Allergies: No Known Allergies  Family History: Family History  Problem Relation Age of Onset  . Hypertension Mother   . Breast cancer Mother   . Prostate cancer Neg Hx   . Bladder Cancer Neg Hx   . Kidney cancer Neg Hx     Social History:  reports that  has never smoked. he has never used smokeless tobacco. He reports that he does not drink alcohol or use drugs.  ROS: UROLOGY Frequent Urination?: Yes Hard to postpone urination?: No Burning/pain with urination?: No Get up at night to urinate?: Yes Leakage of urine?: No Urine stream starts  and stops?: No Trouble starting stream?: No Do you have to strain to urinate?: No Blood in urine?: No Urinary tract infection?: No Sexually transmitted disease?: No Injury to kidneys or bladder?: No Painful intercourse?: No Weak stream?: No Erection problems?: No Penile pain?: No  Gastrointestinal Nausea?: No Vomiting?: No Indigestion/heartburn?: No Diarrhea?: No Constipation?: No  Constitutional Fever: No Night sweats?: No Weight loss?: No Fatigue?: No  Skin Skin rash/lesions?: No Itching?: No  Eyes Blurred vision?: No  Ears/Nose/Throat Sore throat?: No Sinus problems?: No  Hematologic/Lymphatic Swollen glands?: No Easy bruising?: No  Cardiovascular Leg swelling?: No Chest pain?: No  Respiratory Cough?: No Shortness of breath?: No  Endocrine Excessive thirst?: No  Musculoskeletal Back pain?: No Joint pain?: No  Neurological Headaches?: No Dizziness?: No  Psychologic Anxiety?: No  Physical Exam: BP 135/85 (BP Location: Right Arm, Patient Position: Sitting, Cuff Size: Large)   Pulse 79   Ht 5\' 9"  (1.753 m)   Wt 164 lb 12.8 oz (74.8 kg)   BMI 24.34 kg/m   Constitutional:  Alert and oriented, No acute distress. HEENT: [place] AT, moist mucus membranes.  Trachea midline, no masses. Cardiovascular: No clubbing, cyanosis, or edema. Respiratory: Normal respiratory effort, no increased work of breathing. GI: Abdomen is soft, nontender, nondistended, no abdominal masses GU: No CVA tenderness.  Patient does incisional hernia with midline incision at the superior aspect.  Easily reducible Skin: No rashes, bruises or suspicious lesions. Lymph: No cervical or inguinal adenopathy. Neurologic: Grossly intact, no focal deficits, moving all 4 extremities. Psychiatric: Normal mood and affect.  Laboratory Data: Lab Results  Component Value Date   WBC 6.6 04/17/2017   HGB 11.0 (L) 04/17/2017   HCT 33.9 (L) 04/17/2017   MCV 94 04/17/2017   PLT 293  04/17/2017    Lab Results  Component Value Date   CREATININE 1.41 (H) 04/17/2017    No results found for: PSA  No results found for: TESTOSTERONE  No results found for: HGBA1C  Urinalysis    Component Value Date/Time   COLORURINE STRAW (A) 02/19/2017 1026   APPEARANCEUR CLEAR (A) 02/19/2017 1026   LABSPEC 1.006 02/19/2017 1026   PHURINE 7.0 02/19/2017 1026   GLUCOSEU NEGATIVE 02/19/2017 1026   HGBUR SMALL (A) 02/19/2017 1026   BILIRUBINUR NEGATIVE 02/19/2017 [place] 02/19/2017 1026   PROTEINUR NEGATIVE 02/19/2017 1026   NITRITE NEGATIVE 02/19/2017 1026   LEUKOCYTESUR NEGATIVE 02/19/2017 1026    Pertinent Imaging: CT abdomen pelvis reviewed with no sign of malignancy.  Chest x-ray from October 2018 with no sign of metastasis.  Assessment & Plan:    1. pT3a renal cell carcinoma-chromophobe type, margins negative.  -doing well. No evidence of disease. -follow up in 6 months with labs CT chest/abd/pelvis w/w/o.  Will need to continue this every 6 months until November 2020.  It can then be performed annually after  that.  Return in about 6 months (around 10/17/2017) for CT chest/abd/pelvis and labs prior.  Nickie Retort, MD  [facility] [address] [place], [place] [address] [phone]

## 2017-07-22 DIAGNOSIS — L570 Actinic keratosis: Secondary | ICD-10-CM

## 2017-07-22 HISTORY — DX: Actinic keratosis: L57.0

## 2017-08-01 ENCOUNTER — Other Ambulatory Visit: Payer: Self-pay | Admitting: Family Medicine

## 2017-08-01 DIAGNOSIS — R51 Headache: Principal | ICD-10-CM

## 2017-08-01 DIAGNOSIS — R519 Headache, unspecified: Secondary | ICD-10-CM

## 2017-08-05 ENCOUNTER — Ambulatory Visit
Admission: RE | Admit: 2017-08-05 | Discharge: 2017-08-05 | Disposition: A | Discharge status: Home / Self Care | Payer: Medicare Other | Source: Ambulatory Visit | Attending: Family Medicine | Admitting: Family Medicine

## 2017-08-05 DIAGNOSIS — R51 Headache: Secondary | ICD-10-CM | POA: Diagnosis not present

## 2017-08-05 DIAGNOSIS — R519 Headache, unspecified: Secondary | ICD-10-CM

## 2017-10-11 ENCOUNTER — Other Ambulatory Visit: Payer: Medicare Other

## 2017-10-11 DIAGNOSIS — C642 Malignant neoplasm of left kidney, except renal pelvis: Secondary | ICD-10-CM

## 2017-10-12 LAB — COMPREHENSIVE METABOLIC PANEL
ALK PHOS: 105 IU/L (ref 39–117)
ALT: 11 IU/L (ref 0–44)
AST: 18 IU/L (ref 0–40)
Albumin/Globulin Ratio: 1.7 (ref 1.2–2.2)
Albumin: 4.3 g/dL (ref 3.5–4.7)
BUN / CREAT RATIO: 17 (ref 10–24)
BUN: 24 mg/dL (ref 8–27)
Bilirubin Total: 0.3 mg/dL (ref 0.0–1.2)
CHLORIDE: 97 mmol/L (ref 96–106)
CO2: 22 mmol/L (ref 20–29)
Calcium: 9.3 mg/dL (ref 8.6–10.2)
Creatinine, Ser: 1.43 mg/dL — ABNORMAL HIGH (ref 0.76–1.27)
GFR calc Af Amer: 50 mL/min/{1.73_m2} — ABNORMAL LOW (ref >59)
GFR calc non Af Amer: 43 mL/min/{1.73_m2} — ABNORMAL LOW (ref >59)
GLOBULIN, TOTAL: 2.5 g/dL (ref 1.5–4.5)
Glucose: 106 mg/dL — ABNORMAL HIGH (ref 65–99)
POTASSIUM: 5.1 mmol/L (ref 3.5–5.2)
SODIUM: 135 mmol/L (ref 134–144)
TOTAL PROTEIN: 6.8 g/dL (ref 6.0–8.5)

## 2017-10-14 ENCOUNTER — Other Ambulatory Visit: Payer: Medicare Other

## 2017-10-14 ENCOUNTER — Ambulatory Visit
Admission: RE | Admit: 2017-10-14 | Discharge: 2017-10-14 | Disposition: A | Discharge status: Home / Self Care | Payer: Medicare Other | Source: Ambulatory Visit | Attending: Urology | Admitting: Urology

## 2017-10-14 DIAGNOSIS — I2510 Atherosclerotic heart disease of native coronary artery without angina pectoris: Secondary | ICD-10-CM | POA: Diagnosis not present

## 2017-10-14 DIAGNOSIS — K5730 Diverticulosis of large intestine without perforation or abscess without bleeding: Secondary | ICD-10-CM | POA: Insufficient documentation

## 2017-10-14 DIAGNOSIS — I700 Atherosclerosis of aorta: Secondary | ICD-10-CM | POA: Insufficient documentation

## 2017-10-14 DIAGNOSIS — K439 Ventral hernia without obstruction or gangrene: Secondary | ICD-10-CM | POA: Diagnosis not present

## 2017-10-14 DIAGNOSIS — K4090 Unilateral inguinal hernia, without obstruction or gangrene, not specified as recurrent: Secondary | ICD-10-CM | POA: Diagnosis not present

## 2017-10-14 DIAGNOSIS — C642 Malignant neoplasm of left kidney, except renal pelvis: Secondary | ICD-10-CM | POA: Diagnosis present

## 2017-10-14 DIAGNOSIS — I714 Abdominal aortic aneurysm, without rupture: Secondary | ICD-10-CM | POA: Diagnosis not present

## 2017-10-14 MED ORDER — IOPAMIDOL (ISOVUE-300) INJECTION 61%
80.0000 mL | Freq: Once | INTRAVENOUS | Status: AC | PRN
  Administered 2017-10-14: 80 mL via INTRAVENOUS

## 2017-10-17 ENCOUNTER — Ambulatory Visit (INDEPENDENT_AMBULATORY_CARE_PROVIDER_SITE_OTHER): Payer: Medicare Other | Admitting: Urology

## 2017-10-17 ENCOUNTER — Encounter: Payer: Self-pay | Admitting: Urology

## 2017-10-17 VITALS — BP 137/79 | HR 80 | Ht 69.0 in | Wt 170.7 lb

## 2017-10-17 DIAGNOSIS — R350 Frequency of micturition: Secondary | ICD-10-CM

## 2017-10-17 DIAGNOSIS — K432 Incisional hernia without obstruction or gangrene: Secondary | ICD-10-CM

## 2017-10-17 DIAGNOSIS — Z85528 Personal history of other malignant neoplasm of kidney: Secondary | ICD-10-CM

## 2017-10-17 NOTE — Progress Notes (Signed)
10/17/2017 11:00 AM   Frank Bartlett 1926-03-03 [id number]  Referring provider: Leonel Ramsay, MD [place], [place] [address]  Chief Complaint  Patient presents with  . Follow-up    CT results    HPI:  F/u --   1) Renal cell carcinoma - He underwent a laparoscopic hand-assisted left radical nephrectomy Nov 2017. His postoperative course was complicated by C. difficile. In addition, the patient also underwent exploratory laparotomy a week postop after he presented with hypotension and A. fib. This was a negative exploration. Path/Surveillance: 11/17: - T3a-renal cell carcinoma, chromophobe type. Tumor was invading into the renal vein as well as the collecting system. Margins were negative. 3/18: Chest x-ray negative for metastatic disease. 6/18: Negative CT abd/pelvis for malignancy. Punctate right renal stone. 3.5 cm AAA with recommendation for f/u u/s in 2 years (Pt to f/u with PCP regarding this) Cr stable at 1.43. 12/18. Negative CT abd/pelvis for malignancy. Cr 1.2 10/18. Cr 1.4 12/18 (shortly after contrast load). CXR 10/18 without sign of malignancy. Jun 2019 CT Chest/A/P - NED  2) PCa - He underwent brachytherapy in 2001.  His PSA is undetectable - last one Dec 2018.   He returns and is doing well. I reviewed the CT imaging. He has frequency especially at night. He has a variable stream. Last Cr 1.43, gfr 43. No flank pain or gross hematuria.    PMH: Past Medical History:  Diagnosis Date  . B12 deficiency   . Bradycardia   . Hyperlipidemia   . Hypertension   . Hypothyroidism   . Osteoarthritis   . Prostate cancer ([place]) 2001   Rad tx's + seed implants  . Renal cancer, left ([place]) 03/2016   Left Renal Nephrectomy    Surgical History: Past Surgical History:  Procedure Laterality Date  . CATARACT EXTRACTION, BILATERAL    . COLONOSCOPY    . EXCISIONAL HEMORRHOIDECTOMY    . INSERTION PROSTATE RADIATION SEED    . LAPAROSCOPIC  NEPHRECTOMY, HAND ASSISTED Left 03/08/2016   Procedure: HAND ASSISTED LAPAROSCOPIC NEPHRECTOMY;  Surgeon: Nickie Retort, MD;  Location: [facility] ORS;  Service: Urology;  Laterality: Left;  . LAPAROTOMY N/A 03/13/2016   Procedure: EXPLORATORY LAPAROTOMY;  Surgeon: Festus Aloe, MD;  Location: [facility] ORS;  Service: Urology;  Laterality: N/A;  . PACEMAKER INSERTION N/A 02/20/2017   Procedure: INSERTION PACEMAKER;  Surgeon: Isaias Cowman, MD;  Location: [facility] ORS;  Service: Cardiovascular;  Laterality: N/A;    Home Medications:  Allergies as of 10/17/2017   No Known Allergies     Medication List        Accurate as of 10/17/17 11:00 AM. Always use your most recent med list.          amLODipine 5 MG tablet Commonly known as:  NORVASC Take 1 tablet (5 mg total) by mouth daily.   aspirin EC 81 MG tablet Take 81 mg by mouth daily.   feeding supplement (ENSURE ENLIVE) Liqd Take 237 mLs by mouth 2 (two) times daily with a meal.   levothyroxine 112 MCG tablet Commonly known as:  SYNTHROID, LEVOTHROID Take 112 mcg by mouth daily before breakfast.   MULTI-VITAMINS Tabs Take by mouth.   RA VITAMIN B-12 TR 1000 MCG Tbcr Generic drug:  Cyanocobalamin Take 1,000 mcg by mouth daily.       Allergies: No Known Allergies  Family History: Family History  Problem Relation Age of Onset  . Hypertension Mother   . Breast cancer Mother   .  Prostate cancer Neg Hx   . Bladder Cancer Neg Hx   . Kidney cancer Neg Hx     Social History:  reports that he has never smoked. He has never used smokeless tobacco. He reports that he does not drink alcohol or use drugs.  ROS:                                        Physical Exam: There were no vitals taken for this visit.  Constitutional:  Alert and oriented, No acute distress. HEENT: [place] AT, moist mucus membranes.  Trachea midline, no masses. Cardiovascular: No clubbing, cyanosis, or edema. Respiratory: Normal  respiratory effort, no increased work of breathing. GI: Abdomen is soft, nontender, nondistended, no abdominal masses Skin: No rashes, bruises or suspicious lesions. Neurologic: Grossly intact, no focal deficits, moving all 4 extremities. Psychiatric: Normal mood and affect.  Laboratory Data: Lab Results  Component Value Date   WBC 6.6 04/17/2017   HGB 11.0 (L) 04/17/2017   HCT 33.9 (L) 04/17/2017   MCV 94 04/17/2017   PLT 293 04/17/2017    Lab Results  Component Value Date   CREATININE 1.43 (H) 10/11/2017    No results found for: PSA  No results found for: TESTOSTERONE  No results found for: HGBA1C  Urinalysis    Component Value Date/Time   COLORURINE STRAW (A) 02/19/2017 1026   APPEARANCEUR CLEAR (A) 02/19/2017 1026   LABSPEC 1.006 02/19/2017 1026   PHURINE 7.0 02/19/2017 1026   GLUCOSEU NEGATIVE 02/19/2017 1026   HGBUR SMALL (A) 02/19/2017 1026   BILIRUBINUR NEGATIVE 02/19/2017 1026   KETONESUR NEGATIVE 02/19/2017 1026   PROTEINUR NEGATIVE 02/19/2017 1026   NITRITE NEGATIVE 02/19/2017 1026   LEUKOCYTESUR NEGATIVE 02/19/2017 1026    Lab Results  Component Value Date   BACTERIA NONE SEEN 02/19/2017     No results found for this or any previous visit. No results found for this or any previous visit. No results found for this or any previous visit. No results found for this or any previous visit. No results found for this or any previous visit. No results found for this or any previous visit. No results found for this or any previous visit. Results for orders placed during the hospital encounter of 03/12/16  CT RENAL STONE STUDY   Narrative CLINICAL DATA:  Abdominal pain and hypotension. History renal cell carcinoma. Status post left nephrectomy 03/08/2016  EXAM: CT ABDOMEN AND PELVIS WITHOUT CONTRAST  TECHNIQUE: Multidetector CT imaging of the abdomen and pelvis was performed following the standard protocol without IV contrast.  COMPARISON:  CT  abdomen pelvis 02/22/2016  FINDINGS: Lower chest: Small left pleural effusion and associated atelectasis. Trace right pleural effusion.  Hepatobiliary: Scattered hepatic calcified granulomata. The gallbladder is distended.  Pancreas: Normal pancreatic contours. No peripancreatic fluid collection or pancreatic ductal dilatation.  Spleen: The spleen is shrunken with numerous calcified granulomata.  Adrenals/Urinary Tract: The left adrenal is enlarged with a post hemorrhagic appearance. The patient is status post left nephrectomy. Small amount of fluid and inflammatory change in the left renal fossa. No right hydronephrosis. Small calcification within the midportion of the right kidney. Right lower pole renal cyst measures up to 3.6 cm.  Stomach/Bowel: There is inflammatory stranding surrounding the transverse colon at the splenic flexure. No evidence of small-bowel obstruction. No discrete intra-abdominal fluid collection. There is rectosigmoid diverticulosis without clear evidence of acute  diverticulitis. Normal appendix.  Vascular/Lymphatic: There is atherosclerotic calcification of the abdominal aorta. Distal abdominal aortic aneurysm is unchanged in size measuring approximately 3.4 cm. No abdominal or pelvic adenopathy.  Reproductive: Multiple brachytherapy seeds within the prostate. Seminal vesicles are normal.  Musculoskeletal: Severe multilevel lumbar facet arthrosis and osteophytosis. No bony spinal canal stenosis. No focal lytic lesions. Multifocal subcutaneous emphysema.  Other: There is large volume pneumoperitoneum within the anterior abdomen, much greater than expected, even in the context of recent surgery.  IMPRESSION: 1. Large volume pneumoperitoneum within the anterior abdomen, much greater than expected following surgery. The appearance is concerning for bowel perforation. Fifth 2. Wall thickening and inflammatory stranding surrounding the  distal transverse colon and proximal descending colon near the splenic flexure. 3. Small left adrenal hematoma. 4. Aortic atherosclerosis with infrarenal abdominal aortic aneurysm measuring up to 3.4 cm. 5. Status post left nephrectomy. Multifocal retroperitoneal loculated gas may be postoperative.  Critical Value/emergent results were called by telephone at the time of interpretation on 03/12/2016 at 11:51 pm to Dr. Meade Maw , who verbally acknowledged these results.   Electronically Signed   By: Ulyses Jarred M.D.   On: 03/12/2016 23:51     Assessment & Plan:    History of kidney cancer -- NED. Per AUA guidelines needs CT q 6 mo through 2020 and yearly through 2022.  Incisional hernia - stable. Non-tender. Discussed referral to Gen Surg to fix.   Frequency - discussed oab meds. He'll continue surveillance.    No follow-ups on file.  Festus Aloe, MD  [facility] [address], [place], [place] [address] [phone]

## 2018-04-16 ENCOUNTER — Ambulatory Visit
Admission: RE | Admit: 2018-04-16 | Discharge: 2018-04-16 | Disposition: A | Discharge status: Home / Self Care | Payer: Medicare Other | Source: Ambulatory Visit | Attending: Urology | Admitting: Urology

## 2018-04-16 DIAGNOSIS — Z85528 Personal history of other malignant neoplasm of kidney: Secondary | ICD-10-CM | POA: Diagnosis present

## 2018-04-16 MED ORDER — IOPAMIDOL (ISOVUE-300) INJECTION 61%
75.0000 mL | Freq: Once | INTRAVENOUS | Status: AC | PRN
  Administered 2018-04-16: 65 mL via INTRAVENOUS

## 2018-04-18 ENCOUNTER — Ambulatory Visit: Payer: Medicare Other

## 2018-04-21 ENCOUNTER — Ambulatory Visit: Payer: Medicare Other | Admitting: Urology

## 2018-04-21 ENCOUNTER — Encounter: Payer: Self-pay | Admitting: Urology

## 2018-04-21 VITALS — BP 128/76 | HR 85 | Ht 69.0 in | Wt 170.6 lb

## 2018-04-21 DIAGNOSIS — C642 Malignant neoplasm of left kidney, except renal pelvis: Secondary | ICD-10-CM

## 2018-04-21 DIAGNOSIS — E785 Hyperlipidemia, unspecified: Secondary | ICD-10-CM | POA: Insufficient documentation

## 2018-04-21 NOTE — Progress Notes (Signed)
04/21/2018 12:11 PM   Frank Bartlett 1925-10-12 [id number]  Referring provider: Baxter Hire, MD [place], [place] [address]  Chief Complaint  Patient presents with  . Follow-up  . Results   Urologic History 1. History of Kidney Cancer  - Laparoscopic hand-assisted left radical nephrectomy 03/08/2016. Complicated by c. Diff post-op  - T3a Renal Cell Carcinoma, chromophobe type (03/2016). Tumor invasing renal vein and collecting system. Negative margins  - 07/2016: Negative chest xray  - 10/2016: Negative CT abd/pelvis for malignancy. Punctate right renal stone. 3.5 cm AAA with recommendation for f/u u/s in 2 years(Pt to f/u with PCP regarding this)Cr stable at 1.43.  - 04/2017. Negative CT abd/pelvis for malignancy. Cr 1.2 10/18. Cr 1.4 12/18 (shortly after contrast load).   - CXR 10/18 without sign of malignancy.  - Jun 2019 CT Chest/A/P - NED 2. PCa  - He underwent brachytherapy in 2001.   - His PSA is undetectable - last one Dec 2018.   HPI: Frank Bartlett is a 84 y.o. male presenting today for the results of his recent CT abd/pelvis w contrast. He was last seen 6 months ago  - Denies dysuria, gross hematuria or flank/abdominal/pelvic/scrotal pain.   - No unexplained weight loss  - reports nocturia that he has learned to live with and accepted with his age  - Scans revealed no evidence of recurrent tumor  PMH: Past Medical History:  Diagnosis Date  . B12 deficiency   . Bradycardia   . Hyperlipidemia   . Hypertension   . Hypothyroidism   . Osteoarthritis   . Prostate cancer ([place]) 2001   Rad tx's + seed implants  . Renal cancer, left ([place]) 03/2016   Left Renal Nephrectomy    Surgical History: Past Surgical History:  Procedure Laterality Date  . CATARACT EXTRACTION, BILATERAL    . COLONOSCOPY    . EXCISIONAL HEMORRHOIDECTOMY    . INSERTION PROSTATE RADIATION SEED    . LAPAROSCOPIC NEPHRECTOMY, HAND ASSISTED Left 03/08/2016   Procedure: HAND ASSISTED LAPAROSCOPIC NEPHRECTOMY;  Surgeon: Nickie Retort, MD;  Location: [facility] ORS;  Service: Urology;  Laterality: Left;  . LAPAROTOMY N/A 03/13/2016   Procedure: EXPLORATORY LAPAROTOMY;  Surgeon: Festus Aloe, MD;  Location: [facility];  Service: Urology;  Laterality: N/A;  . PACEMAKER INSERTION N/A 02/20/2017   Procedure: INSERTION PACEMAKER;  Surgeon: Isaias Cowman, MD;  Location: [facility];  Service: Cardiovascular;  Laterality: N/A;    Home Medications:  Allergies as of 04/21/2018   No Known Allergies     Medication List       Accurate as of April 21, 2018 12:11 PM. Always use your most recent med list.        amLODipine 5 MG tablet Commonly known as:  NORVASC Take 1 tablet (5 mg total) by mouth daily.   aspirin EC 81 MG tablet Take 81 mg by mouth daily.   levothyroxine 112 MCG tablet Commonly known as:  SYNTHROID, LEVOTHROID Take 112 mcg by mouth daily before breakfast.   lisinopril 20 MG tablet Commonly known as:  PRINIVIL,ZESTRIL   MULTI-VITAMINS Tabs Take by mouth.   RA VITAMIN B-12 TR 1000 MCG Tbcr Generic drug:  Cyanocobalamin Take 1,000 mcg by mouth daily.       Allergies: No Known Allergies  Family History: Family History  Problem Relation Age of Onset  . Hypertension Mother   . Breast cancer Mother   . Prostate cancer Neg Hx   . Bladder  Cancer Neg Hx   . Kidney cancer Neg Hx     Social History:  reports that he has never smoked. He has never used smokeless tobacco. He reports that he does not drink alcohol or use drugs.  ROS: UROLOGY Frequent Urination?: No Hard to postpone urination?: No Burning/pain with urination?: No Get up at night to urinate?: Yes Leakage of urine?: No Urine stream starts and stops?: No Trouble starting stream?: No Do you have to strain to urinate?: No Blood in urine?: No Urinary tract infection?: No Sexually transmitted disease?: No Injury to kidneys or bladder?: No Painful  intercourse?: No Weak stream?: No Erection problems?: No Penile pain?: No  Gastrointestinal Nausea?: No Vomiting?: No Indigestion/heartburn?: No Diarrhea?: No Constipation?: No  Constitutional Fever: No Night sweats?: No Weight loss?: No Fatigue?: No  Skin Skin rash/lesions?: No Itching?: Yes  Eyes Blurred vision?: No Double vision?: No  Ears/Nose/Throat Sore throat?: No Sinus problems?: No  Hematologic/Lymphatic Swollen glands?: No Easy bruising?: No  Cardiovascular Leg swelling?: No Chest pain?: No  Respiratory Cough?: No Shortness of breath?: No  Endocrine Excessive thirst?: No  Musculoskeletal Back pain?: No Joint pain?: No  Neurological Headaches?: No Dizziness?: No  Psychologic Depression?: No Anxiety?: No  Physical Exam: BP 128/76 (BP Location: Left Arm, Patient Position: Sitting, Cuff Size: Large)   Pulse 85   Ht 5\' 9"  (1.753 m)   Wt 170 lb 9.6 oz (77.4 kg)   BMI 25.19 kg/m   Constitutional:  Alert and oriented, No acute distress. Respiratory: Normal respiratory effort, no increased work of breathing. GU: No CVA tenderness Skin: No rashes, bruises or suspicious lesions. Neurologic: Grossly intact, no focal deficits, moving all 4 extremities. Psychiatric: Normal mood and affect.  Pertinent Imaging: CLINICAL DATA:  Restaging/surveillance of left renal cancer. Left nephrectomy 03/08/2016.  EXAM: CT CHEST, ABDOMEN, AND PELVIS WITH CONTRAST  TECHNIQUE: Multidetector CT imaging of the chest, abdomen and pelvis was performed following the standard protocol during bolus administration of intravenous contrast.  CONTRAST:  71mL ISOVUE-300 IOPAMIDOL (ISOVUE-300) INJECTION 61%  COMPARISON:  Multiple exams, including 10/14/2017  FINDINGS: CT CHEST FINDINGS  Cardiovascular: Coronary, aortic arch, and branch vessel atherosclerotic vascular disease. Dual lead defibrillator noted.  Mediastinum/Nodes:  Unremarkable  Lungs/Pleura: Branching high density inferiorly in the right middle lobe is chronically stable and possibly from remote prior barium aspiration or benign calcification.  Musculoskeletal: Lower thoracic spondylosis. Right greater than left degenerative sternoclavicular arthropathy. Mild thoracic kyphosis.  CT ABDOMEN PELVIS FINDINGS  Hepatobiliary: Scattered punctate calcifications throughout the liver similar to prior, compatible with old granulomatous disease. Stable 5 mm hypodense lesion in the right hepatic lobe on image 56/2, likely a cyst or similar benign lesion. Small linear hypodensity in the right hepatic lobe measuring about 1.1 by 0.4 cm on image 57/2, stable and likewise likely benign but technically too small to characterize.  No appreciable biliary dilatation.  Pancreas: Punctate calcifications in the pancreatic parenchyma compatible with chronic calcific pancreatitis. Along the inferior margin of the pancreatic tail, a 1.7 by 1.1 by 1.0 cm simple appearing cystic lesion is present. This is unchanged back through the earliest available comparison of 02/22/2016.  Spleen: Punctate calcifications throughout the spleen compatible with old granulomatous disease.  Adrenals/Urinary Tract: Adrenal glands normal. Left nephrectomy without tumor recurrence along the nephrectomy site. Mild scarring in the right kidney upper pole with several small hypodense lesions the right kidney which in general are technically too small to characterize but which are statistically likely to be benign.  A larger exophytic lesion from the right kidney lower pole measures 3.8 by 2.0 cm and has a post-contrast portal venous phase density of 19 Hounsfield units and a delayed phase density of 20 Hounsfield units; this lesion showed no enhancement on 10/14/2017 and is compatible with a Bosniak category 2 cyst. No urinary tract calculi are observed.  Stomach/Bowel:  Supraumbilical hernia contains transverse colon and omentum. Several additional small supraumbilical hernias contain only adipose tissue. An immediate supraumbilical hernia contains a loop of small bowel and omental adipose tissue. No associated obstruction or complicating feature related to the bowel.  Normal appendix. Sigmoid colon diverticulosis without active diverticulitis.  Vascular/Lymphatic: Aortoiliac atherosclerotic vascular disease. Abdominal aortic aneurysm measures 3.7 by 3.3 cm today on image 82/2, and formerly measured up to 3.7 cm on 10/14/2017. No pathologic adenopathy.  Reproductive: Brachytherapy seed implants in the prostate gland which is small in size.  Other: No supplemental non-categorized findings.  Musculoskeletal: Small sclerotic lesions in the pelvis are chronically stable and likely incidental.  Lumbar spondylosis and degenerative disc disease causing left-sided impingement at L3-4 and L4-5, and bilateral impingement at L5-S1. Grade 1 degenerative anterolisthesis at L4-5.  Chronic heterotopic ossification along the left biceps femoris proximally.  IMPRESSION: 1. No findings of recurrent malignancy. 2. A 1.7 cm in long axis cystic lesion along the inferior margin of the pancreatic tail is unchanged back through the earliest available comparison of 03/24/2016, establishing 2 years of stability. Possibilities include a postinflammatory cystic lesion or an indolent intraductal papillary mucinous neoplasm. Taking into account the patient's age and the size and characteristics of this lesion, follow up CT or MRI using pancreatic protocol in 2 years time is recommended for surveillance. This recommendation follows ACR consensus guidelines: Management of Incidental Pancreatic Cysts: A White Paper of the ACR Incidental Findings Committee. J Am Coll Radiol 9470;96:283-662. 3. Infrarenal abdominal aortic aneurysm 3.7 cm in diameter, unchanged.  Recommend followup by ultrasound in 2 years. This recommendation follows ACR consensus guidelines: White Paper of the ACR Incidental Findings Committee II on Vascular Findings. J Am Coll Radiol 2013; 10:789-794. 4. Ventral hernias containing adipose tissue. One also contains part of the transverse colon and another contains a loop of small bowel. No complicating features. 5. Other imaging findings of potential clinical significance: Aortic Atherosclerosis (ICD10-I70.0). Coronary atherosclerosis. Dual lead pacer. Old granulomatous disease. Left nephrectomy. Sigmoid diverticulosis. Brachytherapy seed implants in the prostate gland. Lumbar impingement at L3-4, L4-5, and L5-S1. Heterotopic ossification along the left proximal biceps femoris muscle and tendon suggesting a remote tear.   Electronically Signed   By: Van Clines M.D.   On: 04/16/2018 14:38 Assessment & Plan:    1. History of T3a Renal Cell Carcinoma, Chromophobe type  - CT (04/16/2018) revealed no recurrent malignancies  - CT chest/abdomen/pelvis with contrast in 6 months  Return in about 6 months (around 10/21/2018) for CT results and follow up.   Abbie Sons, MD  [facility] [address], [place], [place] [address] [phone]   I, Stephania Fragmin , am acting as a scribe for [employer], MD  I, Abbie Sons, MD, have reviewed all documentation for this visit. The documentation on 04/21/18 for the exam, diagnosis, procedures, and orders are all accurate and complete.

## 2018-05-09 IMAGING — CT CT HEAD W/O CM
4 of 7 series · 15 of 47 positions shown, 16 images · non-contrast
Comparison: 06/28/2015

CLINICAL DATA: Status post fall last night.

EXAM:
CT HEAD WITHOUT CONTRAST
CT CERVICAL SPINE WITHOUT CONTRAST
TECHNIQUE: Multidetector CT imaging of the head and cervical spine was
performed following the standard protocol without intravenous
contrast. Multiplanar CT image reconstructions of the cervical spine
were also generated.

[Series 3: head wo · axial · 0.46mm/px · z∈[-103,-53]mm · 2 of 31 slices shown, 3 images]
[im 11/31  brain]
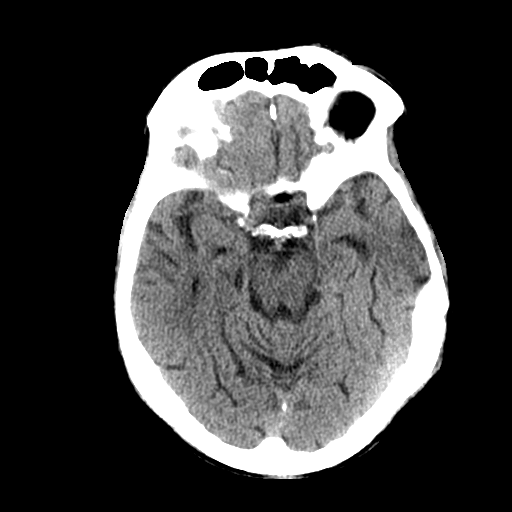
[im 11/31  bone]
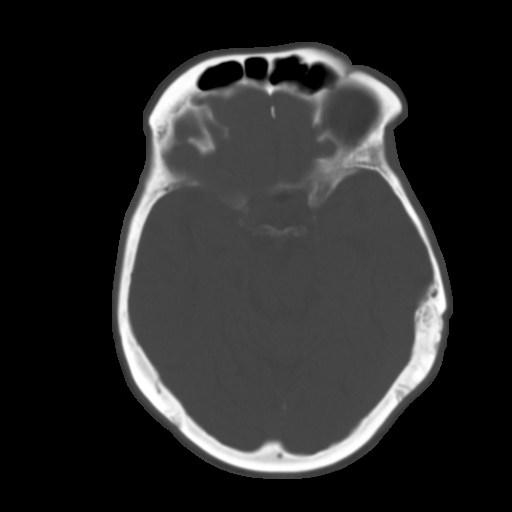
[im 21/31  brain]
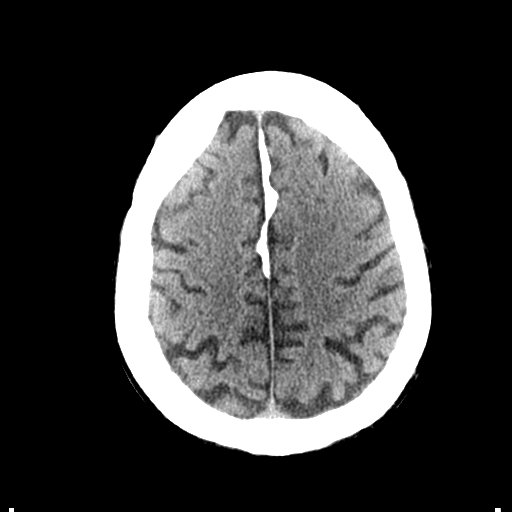

[Series 8: coronal soft tissue · coronal · 0.30mm/px · 3 of 74 slices shown]
[im 25/74  brain]
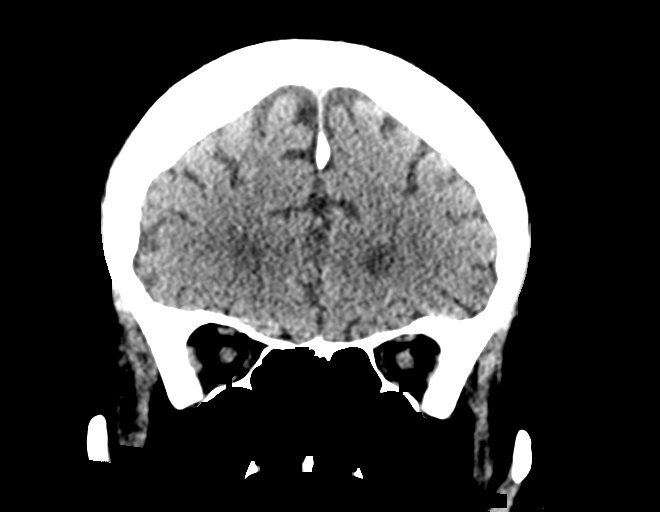
[im 37/74  brain]
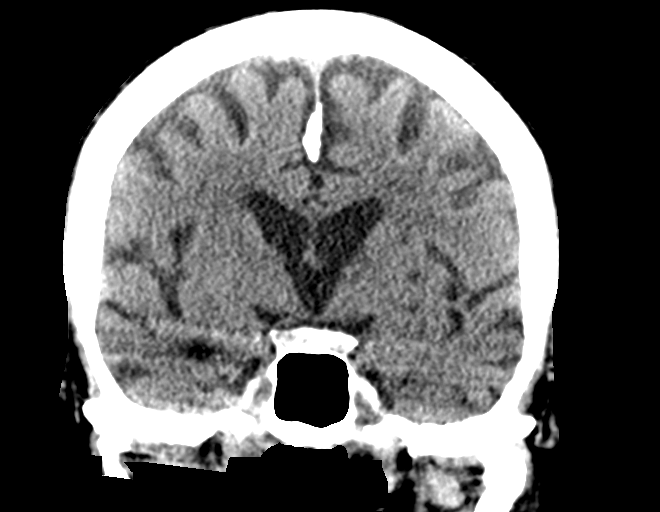
[im 49/74  brain]
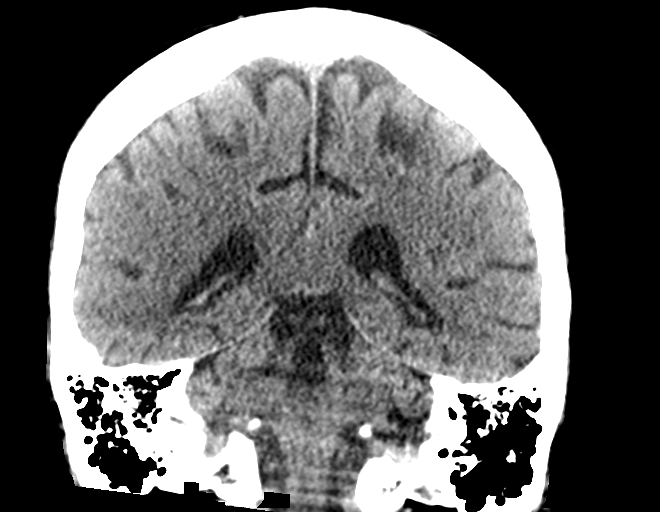

[Series 9: sagittal soft tissue · sagittal · 0.30mm/px · 2 of 56 slices shown]
[im 19/56  brain]
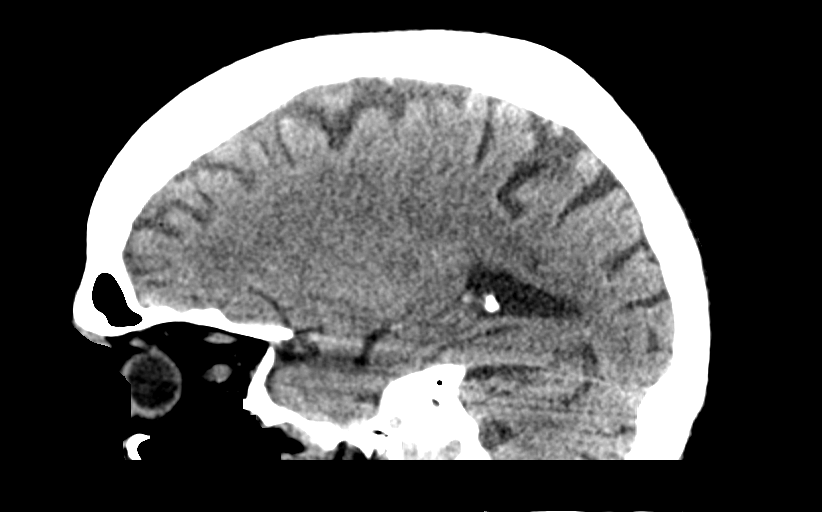
[im 37/56  brain]
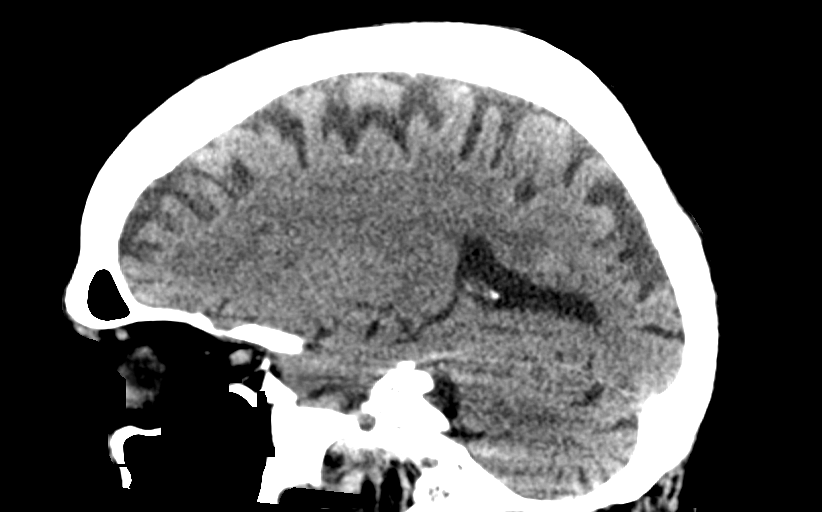

[Series 12: orthogonal bone · axial · 0.25mm/px · z∈[-346,-153]mm · 8 of 134 slices shown]
[im 9/134  bone]
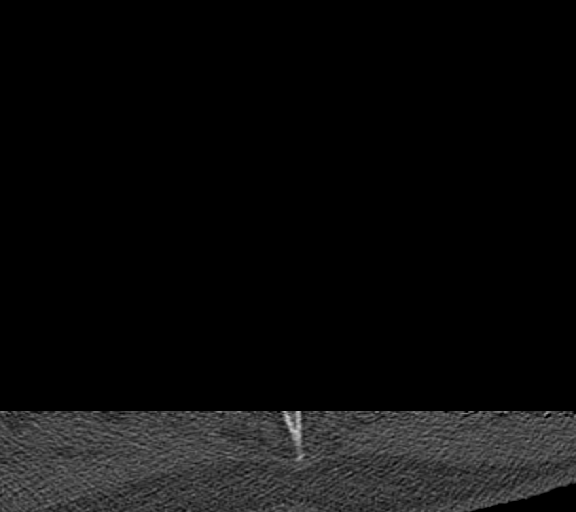
[im 25/134  bone]
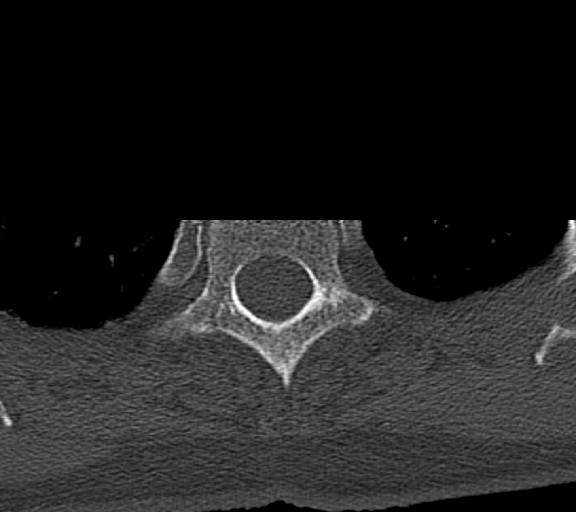
[im 42/134  bone]
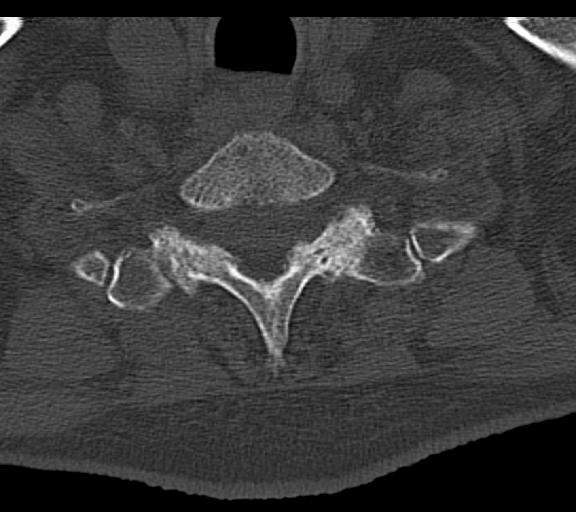
[im 59/134  bone]
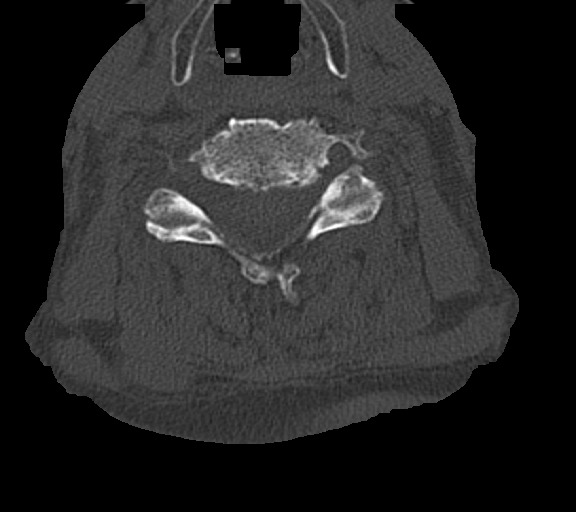
[im 75/134  bone]
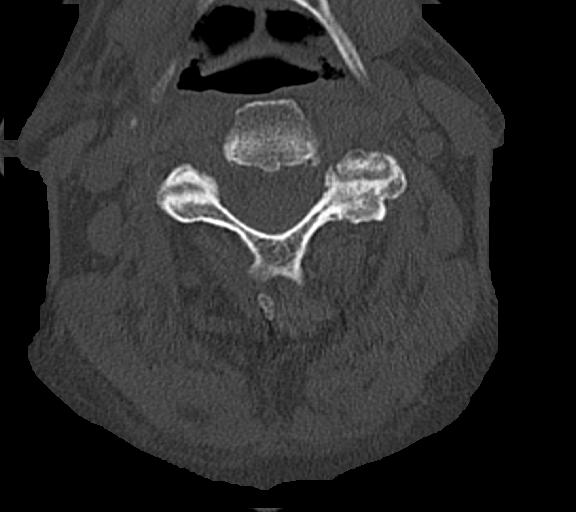
[im 92/134  bone]
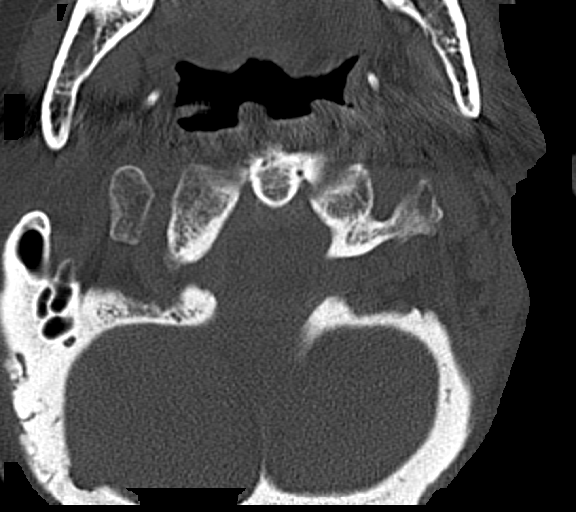
[im 109/134  bone]
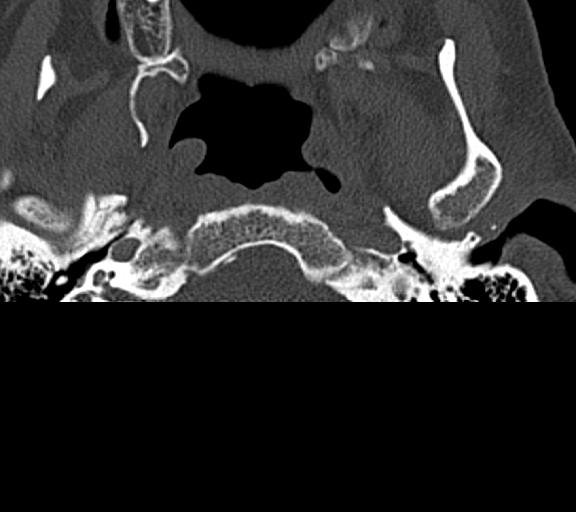
[im 125/134  bone]
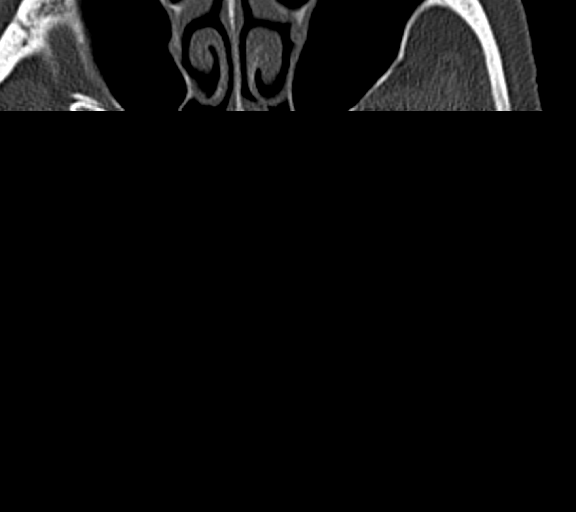

[15 of 47 positions shown; findings below may reference images not displayed]

FINDINGS: CT HEAD FINDINGS

Brain: No evidence of acute infarction, hemorrhage, hydrocephalus,
extra-axial collection or mass lesion/mass effect. Periventricular
white matter low attenuation likely secondary to microvascular
disease. Mild generalized cerebral atrophy.

Vascular: No hyperdense vessel or unexpected calcification.

Skull: No osseous abnormality.

Sinuses/Orbits: Visualized paranasal sinuses are clear. Visualized
mastoid sinuses are clear. Visualized orbits demonstrate no focal
abnormality.

Other: None

CT CERVICAL SPINE FINDINGS

Alignment: Normal.

Skull base and vertebrae: No acute fracture. No primary bone lesion
or focal pathologic process.

Soft tissues and spinal canal: No prevertebral fluid or swelling. No
visible canal hematoma.

Disc levels: Degenerative disc disease with disc height loss at
C4-5, C5-6 and C6-7. Moderate left facet arthropathy at C2-3.
Moderate bilateral facet arthropathy at C3-4 with left foraminal
stenosis moderate bilateral facet arthropathy at C4-5 with a
broad-based disc osteophyte complex. Broad-based disc osteophyte
complex at C5-6 with left facet arthropathy and left foraminal
stenosis.

Upper chest: Lung apices are clear.

Other: No fluid collection or hematoma.
IMPRESSION: 1. No acute intracranial pathology.
2.  No acute osseous injury of the cervical spine.
3. Cervical spine spondylosis.

## 2018-05-28 ENCOUNTER — Encounter: Payer: Self-pay | Admitting: Internal Medicine

## 2018-05-28 DIAGNOSIS — M1990 Unspecified osteoarthritis, unspecified site: Secondary | ICD-10-CM | POA: Insufficient documentation

## 2018-05-28 DIAGNOSIS — E039 Hypothyroidism, unspecified: Secondary | ICD-10-CM | POA: Insufficient documentation

## 2018-05-28 DIAGNOSIS — M1711 Unilateral primary osteoarthritis, right knee: Secondary | ICD-10-CM | POA: Insufficient documentation

## 2018-05-28 DIAGNOSIS — E538 Deficiency of other specified B group vitamins: Secondary | ICD-10-CM | POA: Insufficient documentation

## 2018-05-28 DIAGNOSIS — I10 Essential (primary) hypertension: Secondary | ICD-10-CM | POA: Insufficient documentation

## 2018-06-05 ENCOUNTER — Encounter: Payer: Self-pay | Admitting: Internal Medicine

## 2018-06-05 ENCOUNTER — Ambulatory Visit: Payer: Medicare Other | Admitting: Internal Medicine

## 2018-06-05 VITALS — BP 134/84 | HR 64 | Temp 98.1°F | Resp 16 | Wt 172.8 lb

## 2018-06-05 DIAGNOSIS — I10 Essential (primary) hypertension: Secondary | ICD-10-CM | POA: Diagnosis not present

## 2018-06-05 DIAGNOSIS — I700 Atherosclerosis of aorta: Secondary | ICD-10-CM

## 2018-06-05 DIAGNOSIS — N183 Chronic kidney disease, stage 3 unspecified: Secondary | ICD-10-CM

## 2018-06-05 DIAGNOSIS — I495 Sick sinus syndrome: Secondary | ICD-10-CM

## 2018-06-05 DIAGNOSIS — I714 Abdominal aortic aneurysm, without rupture, unspecified: Secondary | ICD-10-CM

## 2018-06-05 DIAGNOSIS — I4892 Unspecified atrial flutter: Secondary | ICD-10-CM

## 2018-06-05 DIAGNOSIS — E039 Hypothyroidism, unspecified: Secondary | ICD-10-CM

## 2018-06-05 NOTE — Assessment & Plan Note (Signed)
Post op 2 years ago No evidence of recurrence Paced now

## 2018-06-05 NOTE — Assessment & Plan Note (Signed)
Found on CT scan No action needed now due to age and general good status

## 2018-06-05 NOTE — Assessment & Plan Note (Signed)
Relatively small Can consider checking again in 2 years (though may be checked incidentally when follow up for past nephrectomy for renal cell cancer)

## 2018-06-05 NOTE — Progress Notes (Signed)
Subjective:    Patient ID: Frank Bartlett, male    DOB: [birth year], 85 y.o.   MRN: [id number]  HPI Visit to establish care in assisted living here at [facility] here for support--meals, etc Remains independent with personal care Reviewed status with Luellen Pucker RN I have seen him in health care  Nephrectomy ~2 years ago for renal cell cancer Had post op atrial flutter which hasn't recurred Recent reevaluation by urologist---no evidence of recurrence CT did show small AAA---3.7cm Also had aortic atherosclerosis  No palpitations No chest pain or SOB Has pacemaker--monitored by Dr Saralyn Pilar No sig edema---will have some indentations from socks No dizziness or syncope  Is on thyroid replacement Seems to be fine on that  Known CKD 3 Is on ACEI  Walks with cane or walker  Current Outpatient Medications on File Prior to Visit  Medication Sig Dispense Refill  . amLODipine (NORVASC) 5 MG tablet Take 1 tablet (5 mg total) by mouth daily. 30 tablet 0  . aspirin EC 81 MG tablet Take 81 mg by mouth daily.     . Cyanocobalamin (RA VITAMIN B-12 TR) 1000 MCG TBCR Take 1,000 mcg by mouth daily.     Marland Kitchen levothyroxine (SYNTHROID, LEVOTHROID) 112 MCG tablet Take 112 mcg by mouth daily before breakfast.    . lisinopril (PRINIVIL,ZESTRIL) 20 MG tablet     . Multiple Vitamin (MULTI-VITAMINS) TABS Take by mouth.     No current facility-administered medications on file prior to visit.     No Known Allergies  Past Medical History:  Diagnosis Date  . AAA (abdominal aortic aneurysm) ([place])   . Aortic atherosclerosis ([place])   . Atrial fibrillation ([place]) 03/2016   brief spell  . B12 deficiency   . Bradycardia   . Bradycardia 02/2017   Pacer placed  . Chronic kidney disease (CKD), stage III (moderate) ([facility])   . Hyperlipidemia   . Hypertension   . Hypothyroidism   . Osteoarthritis   . Prostate cancer ([place]) 2001   Rad tx's + seed implants  . Renal cancer, left ([place]) 03/2016   Left Renal  Nephrectomy  . Sick sinus syndrome [facility])    Pacemaker    Past Surgical History:  Procedure Laterality Date  . CATARACT EXTRACTION, BILATERAL    . COLONOSCOPY    . EXCISIONAL HEMORRHOIDECTOMY    . INSERTION PROSTATE RADIATION SEED    . LAPAROSCOPIC NEPHRECTOMY, HAND ASSISTED Left 03/08/2016   Procedure: HAND ASSISTED LAPAROSCOPIC NEPHRECTOMY;  Surgeon: Nickie Retort, MD;  Location: [facility] OR[facility];  Service: Urology;  Laterality: Left;  . LAPAROTOMY N/A 03/13/2016   Procedure: EXPLORATORY LAPAROTOMY;  Surgeon: Festus Aloe, MD;  Location: [facility];  Service: Urology;  Laterality: N/A;  . PACEMAKER INSERTION N/A 02/20/2017   Procedure: INSERTION PACEMAKER;  Surgeon: Isaias Cowman, MD;  Location: [facility];  Service: Cardiovascular;  Laterality: N/A;    Family History  Problem Relation Age of Onset  . Hypertension Mother   . Breast cancer Mother   . Colon cancer Father   . Prostate cancer Neg Hx   . Bladder Cancer Neg Hx   . Kidney cancer Neg Hx     Social History   Socioeconomic History  . Marital status: Widowed    Spouse name: Not on file  . Number of children: Not on file  . Years of education: Not on file  . Highest education level: Not on file  Occupational History  . Occupation: Comptroller/accountant    Comment: Retired  Social Needs  . Financial resource strain: Not on file  . Food insecurity:    Worry: Not on file    Inability: Not on file  . Transportation needs:    Medical: Not on file    Non-medical: Not on file  Tobacco Use  . Smoking status: Never Smoker  . Smokeless tobacco: Never Used  Substance and Sexual Activity  . Alcohol use: No    Alcohol/week: 0.0 standard drinks    Comment: rare wine  . Drug use: No  . Sexual activity: Not Currently  Lifestyle  . Physical activity:    Days per week: Not on file    Minutes per session: Not on file  . Stress: Not on file  Relationships  . Social connections:    Talks on phone: Not on file     Gets together: Not on file    Attends religious service: Not on file    Active member of club or organization: Not on file    Attends meetings of clubs or organizations: Not on file    Relationship status: Not on file  . Intimate partner violence:    Fear of current or ex partner: Not on file    Emotionally abused: Not on file    Physically abused: Not on file    Forced sexual activity: Not on file  Other Topics Concern  . Not on file  Social History Narrative   Widowed 1/18   3 children      Has living will   Son [name] is [facility] POA   Has DNR   No tube feeds if cognitively unaware   Review of Systems Bowels are fine. Some leakage--wears pullup underwears No hematuria--rare leakage Ventral hernia post op---no pain, etc No N/V Some grieving over wife's death--- no regular depression or anxiety    Objective:   Physical Exam  Constitutional: He appears well-developed. No distress.  Neck: No thyromegaly present.  Cardiovascular: Normal rate, regular rhythm and normal heart sounds. Exam reveals no gallop.  No murmur heard. Respiratory: Effort normal. No respiratory distress. He has no wheezes. He has no rales.  GI: Soft. There is no abdominal tenderness.  Musculoskeletal:        General: No tenderness or edema.  Lymphadenopathy:    He has no cervical adenopathy.  Skin: No rash noted.  Psychiatric: He has a normal mood and affect. His behavior is normal.           Assessment & Plan:

## 2018-06-05 NOTE — Assessment & Plan Note (Signed)
On ACEI  Will check labs yearly and prn

## 2018-06-05 NOTE — Assessment & Plan Note (Signed)
Seems euthyroid Will check labs yearly

## 2018-06-05 NOTE — Assessment & Plan Note (Signed)
Chronic bradycardia---then symptomatic Pacemaker checked regularly at [facility]

## 2018-06-05 NOTE — Assessment & Plan Note (Signed)
BP Readings from Last 3 Encounters:  06/05/18 134/84  04/21/18 128/76  10/17/17 137/79   Good control No change needed

## 2018-09-26 ENCOUNTER — Other Ambulatory Visit: Payer: Self-pay

## 2018-09-26 ENCOUNTER — Ambulatory Visit: Payer: Medicare Other | Admitting: Internal Medicine

## 2018-09-26 DIAGNOSIS — I714 Abdominal aortic aneurysm, without rupture, unspecified: Secondary | ICD-10-CM

## 2018-09-26 DIAGNOSIS — N183 Chronic kidney disease, stage 3 unspecified: Secondary | ICD-10-CM

## 2018-09-26 DIAGNOSIS — M170 Bilateral primary osteoarthritis of knee: Secondary | ICD-10-CM

## 2018-09-26 DIAGNOSIS — E039 Hypothyroidism, unspecified: Secondary | ICD-10-CM

## 2018-09-26 DIAGNOSIS — I10 Essential (primary) hypertension: Secondary | ICD-10-CM | POA: Diagnosis not present

## 2018-09-26 DIAGNOSIS — I495 Sick sinus syndrome: Secondary | ICD-10-CM

## 2018-09-26 DIAGNOSIS — I4892 Unspecified atrial flutter: Secondary | ICD-10-CM

## 2018-10-01 ENCOUNTER — Encounter: Payer: Self-pay | Admitting: Internal Medicine

## 2018-10-01 NOTE — Assessment & Plan Note (Signed)
Paced Continue ASA  Will monitor

## 2018-10-01 NOTE — Progress Notes (Signed)
Subjective:    Patient ID: Frank Bartlett, male    DOB: 04-Oct-1925, 85 y.o.   MRN: [id number]  HPI  Resident seen in [address] Reviewed with RN, she reports he had an episode of blood in his stool last night, none this am. He denies history of hemorrhoids, fissure. He denies weight loss, nausea, abdominal pain, constipation or diarrhea.  Resident c/o intermittent right knee pain.  Otherwise doing well. Sleeps well. Appetite good. Weight up 10 lbs. Independent with ADL's. Uses a cane in his room, rollator for longer distances. He reports some urinary leakage at times. Bowel's typically okay. He denies chest pain, reflux or SOB.  HTN: Controlled on Amlodipine and Lisinopril.  SSS: Paced.  OA: Mainly in knees. He has prn Tylenol order but does not use it.   Hypothyroidism: He is taking Levothyroxine as prescribed.   HLD: Currently no issues.   CKD 3: Creat 1.43, GFR 43. He is on Lisinopril.  Paroxysmal Afib/flutter: Currently not an issue but on ASA. Paced.  AAA: Asymptomatic. On ASA.  Review of Systems      Past Medical History:  Diagnosis Date  . AAA (abdominal aortic aneurysm) ([place])   . Aortic atherosclerosis ([place])   . Atrial fibrillation ([place]) 03/2016   brief spell  . B12 deficiency   . Bradycardia   . Bradycardia 02/2017   Pacer placed  . Chronic kidney disease (CKD), stage III (moderate) (HCC)   . Hyperlipidemia   . Hypertension   . Hypothyroidism   . Osteoarthritis   . Prostate cancer ([place]) 2001   Rad tx's + seed implants  . Renal cancer, left ([place]) 03/2016   Left Renal Nephrectomy  . Sick sinus syndrome [facility])    Pacemaker    Current Outpatient Medications  Medication Sig Dispense Refill  . amLODipine (NORVASC) 5 MG tablet Take 1 tablet (5 mg total) by mouth daily. 30 tablet 0  . aspirin EC 81 MG tablet Take 81 mg by mouth daily.     . Cyanocobalamin (RA VITAMIN B-12 TR) 1000 MCG TBCR Take 1,000 mcg by mouth daily.     Marland Kitchen levothyroxine (SYNTHROID,  LEVOTHROID) 112 MCG tablet Take 112 mcg by mouth daily before breakfast.    . lisinopril (PRINIVIL,ZESTRIL) 20 MG tablet     . Multiple Vitamin (MULTI-VITAMINS) TABS Take by mouth.     No current facility-administered medications for this visit.     No Known Allergies  Family History  Problem Relation Age of Onset  . Hypertension Mother   . Breast cancer Mother   . Colon cancer Father   . Prostate cancer Neg Hx   . Bladder Cancer Neg Hx   . Kidney cancer Neg Hx     Social History   Socioeconomic History  . Marital status: Widowed    Spouse name: Not on file  . Number of children: Not on file  . Years of education: Not on file  . Highest education level: Not on file  Occupational History  . 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 Financial resource strain: Not on file  . Food insecurity:    Worry: Not on file    Inability: Not on file  . Transportation needs:    Medical: Not on file    Non-medical: Not on file  Tobacco Use  . Smoking status: Never Smoker  . Smokeless tobacco: Never Used  Substance and Sexual Activity  . Alcohol use: No  Alcohol/week: 0.0 standard drinks    Comment: rare wine  . Drug use: No  . Sexual activity: Not Currently  Lifestyle  . Physical activity:    Days per week: Not on file    Minutes per session: Not on file  . Stress: Not on file  Relationships  . Social connections:    Talks on phone: Not on file    Gets together: Not on file    Attends religious service: Not on file    Active member of club or organization: Not on file    Attends meetings of clubs or organizations: Not on file    Relationship status: Not on file  . Intimate partner violence:    Fear of current or ex partner: Not on file    Emotionally abused: Not on file    Physically abused: Not on file    Forced sexual activity: Not on file  Other Topics Concern  . Not on file  Social History Narrative   Widowed 1/18   3 children       Has living will   Son [name] is [facility] POA   Has DNR   No tube feeds if cognitively unaware     Constitutional: Denies fever, malaise, fatigue, headache or abrupt weight changes.  HEENT: Denies eye pain, eye redness, ear pain, ringing in the ears, wax buildup, runny nose, nasal congestion, bloody nose, or sore throat. Respiratory: Denies difficulty breathing, shortness of breath, cough or sputum production.   Cardiovascular: Denies chest pain, chest tightness, palpitations or swelling in the hands or feet.  Gastrointestinal: Pt reports 1 episode of blood in stool. Denies abdominal pain, bloating, constipation, diarrhea.  GU: Denies urgency, frequency, pain with urination, burning sensation, blood in urine, odor or discharge. Musculoskeletal: Pt reports right knee pain. Denies decrease in range of motion, difficulty with gait, muscle pain or joint swelling.  Skin: Denies redness, rashes, lesions or ulcercations.  Neurological: Denies dizziness, difficulty with memory, difficulty with speech or problems with balance and coordination.  Psych: Denies anxiety, depression, SI/HI.  No other specific complaints in a complete review of systems (except as listed in HPI above).  Objective:   Physical Exam  BP 138/89   Pulse 64   Temp 98.1 F (36.7 C)   Resp 20   Wt 182 lb 3.2 oz (82.6 kg)   BMI 26.91 kg/m  Wt Readings from Last 3 Encounters:  10/01/18 182 lb 3.2 oz (82.6 kg)  06/05/18 172 lb 12.8 oz (78.4 kg)  04/21/18 170 lb 9.6 oz (77.4 kg)    General: Appears his stated age, well developed, well nourished in NAD. Skin: Warm, dry and intact.  HEENT: Head: normal shape and size; Eyes: sclera white, no icterus, conjunctiva pink, PERRLA and EOMs intact;  Neck:  No adenopathy noted.  Cardiovascular: Normal rate and rhythm. S1,S2 noted.  No murmur, rubs or gallops noted. No JVD or BLE edema.  Pulmonary/Chest: Normal effort and positive vesicular breath sounds. No respiratory distress. No  wheezes, rales or ronchi noted.  Abdomen: Soft and nontender. Normal bowel sounds. No distention or masses noted.   Neurological: Alert and oriented.  Psychiatric: Mood and affect normal.   BMET    Component Value Date/Time   NA 135 10/11/2017 1004   K 5.1 10/11/2017 1004   CL 97 10/11/2017 1004   CO2 22 10/11/2017 1004   GLUCOSE 106 (H) 10/11/2017 1004   GLUCOSE 100 (H) 02/20/2017 0407   BUN 24 10/11/2017 1004  CREATININE 1.43 (H) 10/11/2017 1004   CALCIUM 9.3 10/11/2017 1004   GFRNONAA 43 (L) 10/11/2017 1004   GFRAA 50 (L) 10/11/2017 1004    Lipid Panel  No results found for: CHOL, TRIG, HDL, CHOLHDL, VLDL, LDLCALC  CBC    Component Value Date/Time   WBC 6.6 04/17/2017 1031   WBC 5.2 02/20/2017 0407   RBC 3.60 (L) 04/17/2017 1031   RBC 3.78 (L) 02/20/2017 0407   HGB 11.0 (L) 04/17/2017 1031   HCT 33.9 (L) 04/17/2017 1031   PLT 293 04/17/2017 1031   MCV 94 04/17/2017 1031   MCH 30.6 04/17/2017 1031   MCH 32.3 02/20/2017 0407   MCHC 32.4 04/17/2017 1031   MCHC 34.9 02/20/2017 0407   RDW 14.1 04/17/2017 1031   LYMPHSABS 0.5 (L) 03/12/2016 2216   MONOABS 0.6 03/12/2016 2216   EOSABS 0.1 03/12/2016 2216   BASOSABS 0.1 03/12/2016 2216    Hgb A1C No results found for: HGBA1C          Assessment & Plan:

## 2018-10-01 NOTE — Assessment & Plan Note (Signed)
Continue Levothyroxine Monitor Free T4 yearly

## 2018-10-01 NOTE — Assessment & Plan Note (Signed)
Controlled on Amlodipine and Lisinopril Will monitor

## 2018-10-01 NOTE — Assessment & Plan Note (Signed)
Paced  

## 2018-10-01 NOTE — Assessment & Plan Note (Signed)
Advised him to ask for PRN Tylenol if needed

## 2018-10-01 NOTE — Assessment & Plan Note (Addendum)
Continue Lisinopril Check BMET yearly

## 2018-10-01 NOTE — Assessment & Plan Note (Signed)
Continue ASA F/U in 2 years

## 2018-10-01 NOTE — Patient Instructions (Signed)

## 2018-10-02 ENCOUNTER — Encounter: Payer: Self-pay | Admitting: Family Medicine

## 2018-10-02 LAB — BASIC METABOLIC PANEL
Creatinine: 1.6 — AB (ref 0.6–1.3)
Sodium: 133 — AB (ref 137–147)

## 2018-10-15 ENCOUNTER — Encounter: Payer: Self-pay | Admitting: Urology

## 2018-10-15 ENCOUNTER — Ambulatory Visit: Payer: Medicare Other | Admitting: Urology

## 2018-10-16 ENCOUNTER — Encounter: Payer: Self-pay | Admitting: Urology

## 2018-10-28 ENCOUNTER — Ambulatory Visit
Admission: RE | Admit: 2018-10-28 | Discharge: 2018-10-28 | Disposition: A | Discharge status: Home / Self Care | Payer: Medicare Other | Source: Ambulatory Visit | Attending: Urology | Admitting: Urology

## 2018-10-28 ENCOUNTER — Other Ambulatory Visit: Payer: Self-pay

## 2018-10-28 DIAGNOSIS — C642 Malignant neoplasm of left kidney, except renal pelvis: Secondary | ICD-10-CM | POA: Diagnosis present

## 2018-10-28 LAB — POCT I-STAT CREATININE: Creatinine, Ser: 1.6 mg/dL — ABNORMAL HIGH (ref 0.61–1.24)

## 2018-10-28 MED ORDER — IOHEXOL 300 MG/ML  SOLN
100.0000 mL | Freq: Once | INTRAMUSCULAR | Status: AC | PRN
  Administered 2018-10-28: 80 mL via INTRAVENOUS

## 2018-10-30 ENCOUNTER — Encounter: Payer: Self-pay | Admitting: Urology

## 2018-10-30 ENCOUNTER — Ambulatory Visit: Payer: Medicare Other | Admitting: Urology

## 2018-10-30 ENCOUNTER — Other Ambulatory Visit: Payer: Self-pay

## 2018-10-30 VITALS — BP 173/92 | HR 69 | Ht 69.0 in | Wt 182.0 lb

## 2018-10-30 DIAGNOSIS — C649 Malignant neoplasm of unspecified kidney, except renal pelvis: Secondary | ICD-10-CM | POA: Diagnosis not present

## 2018-11-01 ENCOUNTER — Encounter: Payer: Self-pay | Admitting: Urology

## 2018-11-01 NOTE — Progress Notes (Signed)
10/30/2018 12:18 AM   Frank Bartlett 09/06/1925 [id number]  Referring provider: Venia Carbon, MD [address] [place],  [place] [address]  Chief Complaint  Patient presents with   Follow-up    CT results    Urologic History 1. History of Kidney Cancer             - Laparoscopic hand-assisted left radical nephrectomy 03/08/2016. Complicated by c. Diff post-op             - T3a Renal Cell Carcinoma, chromophobe type (03/2016). Tumor invasing renal vein and collecting system. Negative margins              2. PCa             -He underwent brachytherapyin 2001.              - His PSA is undetectable- last one Dec 2018.   HPI: 85 year old male presents for six-month follow-up of renal cell carcinoma.  Since his last visit he states he has been doing well.  He denies bothersome lower urinary tract symptoms, gross hematuria or flank/abdominal/pelvic pain.  CT of the chest/abdomen/pelvis performed earlier this week showed no evidence of recurrent renal cell carcinoma.   PMH: Past Medical History:  Diagnosis Date   AAA (abdominal aortic aneurysm) ([place])    Aortic atherosclerosis ([place])    Atrial fibrillation ([place]) 03/2016   brief spell   B12 deficiency    Bradycardia    Bradycardia 02/2017   Pacer placed   Chronic kidney disease (CKD), stage III (moderate) (HCC)    Hyperlipidemia    Hypertension    Hypothyroidism    Osteoarthritis    Prostate cancer ([place]) 2001   Rad tx's + seed implants   Renal cancer, left ([place]) 03/2016   Left Renal Nephrectomy   Sick sinus syndrome [facility])    Pacemaker    Surgical History: Past Surgical History:  Procedure Laterality Date   CATARACT EXTRACTION, BILATERAL     COLONOSCOPY     EXCISIONAL HEMORRHOIDECTOMY     INSERTION PROSTATE RADIATION SEED     LAPAROSCOPIC NEPHRECTOMY, HAND ASSISTED Left 03/08/2016   Procedure: HAND ASSISTED LAPAROSCOPIC NEPHRECTOMY;  Surgeon: Nickie Retort, MD;  Location:  [facility] ORS;  Service: Urology;  Laterality: Left;   LAPAROTOMY N/A 03/13/2016   Procedure: EXPLORATORY LAPAROTOMY;  Surgeon: Festus Aloe, MD;  Location: [facility] ORS;  Service: Urology;  Laterality: N/A;   PACEMAKER INSERTION N/A 02/20/2017   Procedure: INSERTION PACEMAKER;  Surgeon: Isaias Cowman, MD;  Location: [facility] ORS;  Service: Cardiovascular;  Laterality: N/A;    Home Medications:  Allergies as of 10/30/2018   No Known Allergies     Medication List       Accurate as of October 30, 2018 11:59 PM. If you have any questions, ask your nurse or doctor.        amLODipine 5 MG tablet Commonly known as: NORVASC Take 1 tablet (5 mg total) by mouth daily.   aspirin EC 81 MG tablet Take 81 mg by mouth daily.   levothyroxine 112 MCG tablet Commonly known as: SYNTHROID Take 112 mcg by mouth daily before breakfast.   lisinopril 20 MG tablet Commonly known as: ZESTRIL   Multi-Vitamins Tabs Take by mouth.   RA Vitamin B-12 TR 1000 MCG Tbcr Generic drug: Cyanocobalamin Take 1,000 mcg by mouth daily.       Allergies: No Known Allergies  Family History: Family History  Problem Relation Age  of Onset   Hypertension Mother    Breast cancer Mother    Colon cancer Father    Prostate cancer Neg Hx    Bladder Cancer Neg Hx    Kidney cancer Neg Hx     Social History:  reports that he has never smoked. He has never used smokeless tobacco. He reports that he does not drink alcohol or use drugs.  ROS: UROLOGY Frequent Urination?: No Hard to postpone urination?: No Burning/pain with urination?: No Get up at night to urinate?: Yes Leakage of urine?: No Urine stream starts and stops?: No Trouble starting stream?: No Do you have to strain to urinate?: No Blood in urine?: No Urinary tract infection?: No Sexually transmitted disease?: No Injury to kidneys or bladder?: No Painful intercourse?: No Weak stream?: No Erection problems?: No Penile pain?:  No  Gastrointestinal Nausea?: No Vomiting?: No Indigestion/heartburn?: No Diarrhea?: No Constipation?: No  Constitutional Fever: No Night sweats?: No Weight loss?: No Fatigue?: No  Skin Skin rash/lesions?: No Itching?: No  Eyes Blurred vision?: No Double vision?: No  Ears/Nose/Throat Sore throat?: No Sinus problems?: No  Hematologic/Lymphatic Swollen glands?: No Easy bruising?: No  Cardiovascular Leg swelling?: No Chest pain?: No  Respiratory Cough?: No Shortness of breath?: No  Endocrine Excessive thirst?: No  Musculoskeletal Back pain?: No Joint pain?: No  Neurological Headaches?: No Dizziness?: No  Psychologic Depression?: No Anxiety?: No  Physical Exam: BP (!) 173/92 (BP Location: Left Arm, Patient Position: Sitting)    Pulse 69    Ht 5\' 9"  (1.753 m)    Wt 182 lb (82.6 kg)    BMI 26.88 kg/m   Constitutional:  Alert and oriented, No acute distress. HEENT: [place] AT, moist mucus membranes.  Trachea midline, no masses. Cardiovascular: No clubbing, cyanosis, or edema. Respiratory: Normal respiratory effort, no increased work of breathing. GI: Abdomen is soft, nontender, nondistended, no abdominal masses GU: No CVA tenderness Lymph: No cervical or inguinal lymphadenopathy. Skin: No rashes, bruises or suspicious lesions. Neurologic: Grossly intact, no focal deficits, moving all 4 extremities. Psychiatric: Normal mood and affect.   Assessment & Plan:   85 year-old male with T3 renal cell carcinoma and no evidence of recurrent disease on recent CT chest/abdomen/pelvis.  Follow-up 6 months for comprehensive metabolic panel and repeat CT chest/abdomen/pelvis.  If negative will go to annual visits.   Return in about 6 months (around 05/01/2019) for Recheck, comprehensive metabolic panel, CT chest/abdomen/pelvis.  Abbie Sons, [place] [address] [place], [place] [address] [phone]

## 2018-11-21 ENCOUNTER — Ambulatory Visit: Payer: Medicare Other | Admitting: Internal Medicine

## 2018-11-21 VITALS — BP 151/83 | HR 67 | Temp 98.1°F | Resp 18 | Wt 184.4 lb

## 2018-11-21 DIAGNOSIS — I10 Essential (primary) hypertension: Secondary | ICD-10-CM | POA: Diagnosis not present

## 2018-11-21 NOTE — Progress Notes (Signed)
Subjective:    Patient ID: Frank Bartlett, male    DOB: 11-26-1925, 85 y.o.   MRN: [id number]  HPI  Asked to see resident in [address] Episodic dizziness, mild headache yesterday BP was ranging 160-190/90's. He is taking Lisinopril and Amlodipine as prescribed No falls, chest pain or shortness of breath Symptoms resolved today, BP 151/83  Review of Systems  Past Medical History:  Diagnosis Date  . AAA (abdominal aortic aneurysm) ([place])   . Aortic atherosclerosis ([place])   . Atrial fibrillation ([place]) 03/2016   brief spell  . B12 deficiency   . Bradycardia   . Bradycardia 02/2017   Pacer placed  . Chronic kidney disease (CKD), stage III (moderate) (HCC)   . Hyperlipidemia   . Hypertension   . Hypothyroidism   . Osteoarthritis   . Prostate cancer ([place]) 2001   Rad tx's + seed implants  . Renal cancer, left ([place]) 03/2016   Left Renal Nephrectomy  . Sick sinus syndrome [facility])    Pacemaker    Current Outpatient Medications  Medication Sig Dispense Refill  . amLODipine (NORVASC) 5 MG tablet Take 1 tablet (5 mg total) by mouth daily. 30 tablet 0  . aspirin EC 81 MG tablet Take 81 mg by mouth daily.     . Cyanocobalamin (RA VITAMIN B-12 TR) 1000 MCG TBCR Take 1,000 mcg by mouth daily.     Marland Kitchen levothyroxine (SYNTHROID, LEVOTHROID) 112 MCG tablet Take 112 mcg by mouth daily before breakfast.    . lisinopril (PRINIVIL,ZESTRIL) 20 MG tablet     . Multiple Vitamin (MULTI-VITAMINS) TABS Take by mouth.     No current facility-administered medications for this visit.     No Known Allergies  Family History  Problem Relation Age of Onset  . Hypertension Mother   . Breast cancer Mother   . Colon cancer Father   . Prostate cancer Neg Hx   . Bladder Cancer Neg Hx   . Kidney cancer Neg Hx     Social History   Socioeconomic History  . Marital status: Widowed    Spouse name: Not on file  . Number of children: Not on file  . Years of education: Not on file  . Highest education  level: Not on file  Occupational History  . 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 Financial resource strain: Not on file  . Food insecurity    Worry: Not on file    Inability: Not on file  . Transportation needs    Medical: Not on file    Non-medical: Not on file  Tobacco Use  . Smoking status: Never Smoker  . Smokeless tobacco: Never Used  Substance and Sexual Activity  . Alcohol use: No    Alcohol/week: 0.0 standard drinks    Comment: rare wine  . Drug use: No  . Sexual activity: Not Currently  Lifestyle  . Physical activity    Days per week: Not on file    Minutes per session: Not on file  . Stress: Not on file  Relationships  . Social Herbalist on phone: Not on file    Gets together: Not on file    Attends religious service: Not on file    Active member of club or organization: Not on file    Attends meetings of clubs or organizations: Not on file    Relationship status: Not on file  . Intimate partner violence  Fear of current or ex partner: Not on file    Emotionally abused: Not on file    Physically abused: Not on file    Forced sexual activity: Not on file  Other Topics Concern  . Not on file  Social History Narrative   Widowed 1/18   3 children      Has living will   Son [name] is [facility] POA   Has DNR   No tube feeds if cognitively unaware     Constitutional: Denies fever, malaise, fatigue, headache or abrupt weight changes.  Respiratory: Denies difficulty breathing, shortness of breath, cough or sputum production.   Cardiovascular: Denies chest pain, chest tightness, palpitations or swelling in the hands or feet.  Neurological: Denies dizziness, difficulty with memory, difficulty with speech or problems with balance and coordination.    No other specific complaints in a complete review of systems (except as listed in HPI above).     Objective:   Physical Exam  BP (!) 151/83   Pulse 67   Temp 98.1 F  (36.7 C)   Resp 18   Wt 184 lb 6.4 oz (83.6 kg)   BMI 27.23 kg/m  Wt Readings from Last 3 Encounters:  11/22/18 184 lb 6.4 oz (83.6 kg)  10/30/18 182 lb (82.6 kg)  10/01/18 182 lb 3.2 oz (82.6 kg)    General: Appears his stated age, well developed, well nourished in NAD. Cardiovascular: Normal rate and rhythm. S1,S2 noted.  No murmur, rubs or gallops noted. R>L BLE edema. No carotid bruits noted. Pulmonary/Chest: Normal effort and positive vesicular breath sounds. No respiratory distress. No wheezes, rales or ronchi noted.  Neurological: Alert and oriented.  :  BMET    Component Value Date/Time   NA 135 10/11/2017 1004   K 5.1 10/11/2017 1004   CL 97 10/11/2017 1004   CO2 22 10/11/2017 1004   GLUCOSE 106 (H) 10/11/2017 1004   GLUCOSE 100 (H) 02/20/2017 0407   BUN 24 10/11/2017 1004   CREATININE 1.60 (H) 10/28/2018 1418   CALCIUM 9.3 10/11/2017 1004   GFRNONAA 43 (L) 10/11/2017 1004   GFRAA 50 (L) 10/11/2017 1004    Lipid Panel  No results found for: CHOL, TRIG, HDL, CHOLHDL, VLDL, LDLCALC  CBC    Component Value Date/Time   WBC 6.6 04/17/2017 1031   WBC 5.2 02/20/2017 0407   RBC 3.60 (L) 04/17/2017 1031   RBC 3.78 (L) 02/20/2017 0407   HGB 11.0 (L) 04/17/2017 1031   HCT 33.9 (L) 04/17/2017 1031   PLT 293 04/17/2017 1031   MCV 94 04/17/2017 1031   MCH 30.6 04/17/2017 1031   MCH 32.3 02/20/2017 0407   MCHC 32.4 04/17/2017 1031   MCHC 34.9 02/20/2017 0407   RDW 14.1 04/17/2017 1031   LYMPHSABS 0.5 (L) 03/12/2016 2216   MONOABS 0.6 03/12/2016 2216   EOSABS 0.1 03/12/2016 2216   BASOSABS 0.1 03/12/2016 2216    Hgb A1C No results found for: HGBA1C          Assessment & Plan:   HTN:  Continue Lisinopril Increase Amlodipine to 10 mg Check BP every MWF Monitor for new or worsening symptoms  Will reassess as needed Webb Silversmith, NP

## 2018-11-22 ENCOUNTER — Encounter: Payer: Self-pay | Admitting: Internal Medicine

## 2018-11-22 MED ORDER — AMLODIPINE BESYLATE 10 MG PO TABS: 10.0000 mg | ORAL_TABLET | Freq: Every day | ORAL | 3 refills | Status: DC

## 2018-11-22 NOTE — Patient Instructions (Signed)

## 2018-12-25 ENCOUNTER — Ambulatory Visit: Payer: Medicare Other | Admitting: Internal Medicine

## 2018-12-25 ENCOUNTER — Encounter: Payer: Self-pay | Admitting: Internal Medicine

## 2018-12-25 VITALS — BP 132/81 | Temp 82.0°F | Resp 18 | Wt 186.6 lb

## 2018-12-25 DIAGNOSIS — M159 Polyosteoarthritis, unspecified: Secondary | ICD-10-CM

## 2018-12-25 DIAGNOSIS — N183 Chronic kidney disease, stage 3 unspecified: Secondary | ICD-10-CM

## 2018-12-25 DIAGNOSIS — M150 Primary generalized (osteo)arthritis: Secondary | ICD-10-CM

## 2018-12-25 DIAGNOSIS — I714 Abdominal aortic aneurysm, without rupture, unspecified: Secondary | ICD-10-CM

## 2018-12-25 DIAGNOSIS — I10 Essential (primary) hypertension: Secondary | ICD-10-CM | POA: Diagnosis not present

## 2018-12-25 DIAGNOSIS — I4892 Unspecified atrial flutter: Secondary | ICD-10-CM

## 2018-12-25 NOTE — Assessment & Plan Note (Signed)
GFR has been stable Is on ACEI

## 2018-12-25 NOTE — Assessment & Plan Note (Signed)
Only 3.6cm on recent CT scan

## 2018-12-25 NOTE — Progress Notes (Signed)
Subjective:    Patient ID: Frank Bartlett, male    DOB: 1925-09-26, 85 y.o.   MRN: [id number]  HPI Visit in [place] apartment for review of chronic health conditions Reviewed status with Luellen Pucker RN  Recent surveillance CT of abdomen for history of renal cell carcinoma No evidence of recurrence. Several hernias (without symptoms) AAA  --3.6cm   Some concern about BP Amlodipine recently increased to 10mg   He has been monitoring it himself---as low as 101/72. Mostly systolic in 119'E to 174'Y. Rarely over 814 diastolics good No dizziness No chest pain or SOB No palpitations --does have pacemaker  Chronic CKD---GFR stable in 50's Is on lisinopril  Takes tylenol and ointment for right knee This has helped the pain  Current Outpatient Medications on File Prior to Visit  Medication Sig Dispense Refill  . acetaminophen (TYLENOL) 500 MG tablet Take 500 mg by mouth daily.    Marland Kitchen amLODipine (NORVASC) 10 MG tablet Take 1 tablet (10 mg total) by mouth daily. 90 tablet 3  . aspirin EC 81 MG tablet Take 81 mg by mouth daily.     . Cyanocobalamin (RA VITAMIN B-12 TR) 1000 MCG TBCR Take 1,000 mcg by mouth daily.     Marland Kitchen levothyroxine (SYNTHROID, LEVOTHROID) 112 MCG tablet Take 112 mcg by mouth daily before breakfast.    . lisinopril (PRINIVIL,ZESTRIL) 20 MG tablet     . Multiple Vitamin (MULTI-VITAMINS) TABS Take by mouth.     No current facility-administered medications on file prior to visit.     No Known Allergies  Past Medical History:  Diagnosis Date  . AAA (abdominal aortic aneurysm) ([place])   . Aortic atherosclerosis ([place])   . Atrial fibrillation ([place]) 03/2016   brief spell  . B12 deficiency   . Bradycardia   . Bradycardia 02/2017   Pacer placed  . Chronic kidney disease (CKD), stage III (moderate) ([facility])   . Hyperlipidemia   . Hypertension   . Hypothyroidism   . Osteoarthritis   . Prostate cancer ([place]) 2001   Rad tx's + seed implants  . Renal cancer, left ([place]) 03/2016   Left Renal Nephrectomy  . Sick sinus syndrome [facility])    Pacemaker    Past Surgical History:  Procedure Laterality Date  . CATARACT EXTRACTION, BILATERAL    . COLONOSCOPY    . EXCISIONAL HEMORRHOIDECTOMY    . INSERTION PROSTATE RADIATION SEED    . LAPAROSCOPIC NEPHRECTOMY, HAND ASSISTED Left 03/08/2016   Procedure: HAND ASSISTED LAPAROSCOPIC NEPHRECTOMY;  Surgeon: Nickie Retort, MD;  Location: [facility] ORS;  Service: Urology;  Laterality: Left;  . LAPAROTOMY N/A 03/13/2016   Procedure: EXPLORATORY LAPAROTOMY;  Surgeon: Festus Aloe, MD;  Location: [facility] ORS;  Service: Urology;  Laterality: N/A;  . PACEMAKER INSERTION N/A 02/20/2017   Procedure: INSERTION PACEMAKER;  Surgeon: Isaias Cowman, MD;  Location: [facility] ORS;  Service: Cardiovascular;  Laterality: N/A;    Family History  Problem Relation Age of Onset  . Hypertension Mother   . Breast cancer Mother   . Colon cancer Father   . Prostate cancer Neg Hx   . Bladder Cancer Neg Hx   . Kidney cancer Neg Hx     Social History   Socioeconomic History  . Marital status: Widowed    Spouse name: Not on file  . Number of children: Not on file  . Years of education: Not on file  . Highest education level: Not on file  Occupational History  . 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 Financial resource strain: Not on file  . Food insecurity    Worry: Not on file    Inability: Not on file  . Transportation needs    Medical: Not on file    Non-medical: Not on file  Tobacco Use  . Smoking status: Never Smoker  . Smokeless tobacco: Never Used  Substance and Sexual Activity  . Alcohol use: No    Alcohol/week: 0.0 standard drinks    Comment: rare wine  . Drug use: No  . Sexual activity: Not Currently  Lifestyle  . Physical activity    Days per week: Not on file    Minutes per session: Not on file  . Stress: Not on file  Relationships  . Social Herbalist on phone: Not on  file    Gets together: Not on file    Attends religious service: Not on file    Active member of club or organization: Not on file    Attends meetings of clubs or organizations: Not on file    Relationship status: Not on file  . Intimate partner violence    Fear of current or ex partner: Not on file    Emotionally abused: Not on file    Physically abused: Not on file    Forced sexual activity: Not on file  Other Topics Concern  . Not on file  Social History Narrative   Widowed 1/18   3 children      Has living will   Son [name] is [facility] POA   Has DNR   No tube feeds if cognitively unaware   Review of Systems Still feels his appetite is down since C diff 3 years ago He does eat though Weight up ~20# since being [place]or [place] on weather Sleeps fine---starts in bed, then into the recliner Voids okay--seems to empty fine     Objective:   Physical Exam  Constitutional: He appears well-developed. No distress.  Neck: No thyromegaly present.  Cardiovascular: Normal rate, regular rhythm and normal heart sounds. Exam reveals no gallop.  No murmur heard. Respiratory: Effort normal and breath sounds normal. No respiratory distress. He has no wheezes. He has no rales.  GI: Soft. He exhibits no distension. There is no abdominal tenderness. There is no rebound and no guarding.  Easily reducible ventral hernia  Musculoskeletal:        General: No edema.  Lymphadenopathy:    He has no cervical adenopathy.  Psychiatric: He has a normal mood and affect. His behavior is normal.           Assessment & Plan:

## 2018-12-25 NOTE — Assessment & Plan Note (Signed)
No evidence of recurrence 

## 2018-12-25 NOTE — Assessment & Plan Note (Signed)
Mostly right knee Uses topical and daily tylenol and is doing well

## 2018-12-25 NOTE — Assessment & Plan Note (Signed)
BP Readings from Last 3 Encounters:  12/25/18 132/81  11/22/18 (!) 151/83  10/30/18 (!) 173/92   Better on the higher amlodipine dose No edema or other problems with that

## 2019-03-30 ENCOUNTER — Encounter: Payer: Self-pay | Admitting: Urology

## 2019-04-22 ENCOUNTER — Ambulatory Visit: Payer: Medicare Other | Admitting: Internal Medicine

## 2019-04-22 ENCOUNTER — Other Ambulatory Visit: Payer: Self-pay

## 2019-04-22 DIAGNOSIS — E7800 Pure hypercholesterolemia, unspecified: Secondary | ICD-10-CM | POA: Diagnosis not present

## 2019-04-22 DIAGNOSIS — N1832 Chronic kidney disease, stage 3b: Secondary | ICD-10-CM

## 2019-04-22 DIAGNOSIS — I10 Essential (primary) hypertension: Secondary | ICD-10-CM | POA: Diagnosis not present

## 2019-04-22 DIAGNOSIS — M159 Polyosteoarthritis, unspecified: Secondary | ICD-10-CM

## 2019-04-22 DIAGNOSIS — M8949 Other hypertrophic osteoarthropathy, multiple sites: Secondary | ICD-10-CM

## 2019-04-22 DIAGNOSIS — E039 Hypothyroidism, unspecified: Secondary | ICD-10-CM

## 2019-04-22 DIAGNOSIS — I495 Sick sinus syndrome: Secondary | ICD-10-CM

## 2019-04-22 DIAGNOSIS — I4892 Unspecified atrial flutter: Secondary | ICD-10-CM | POA: Diagnosis not present

## 2019-04-24 ENCOUNTER — Encounter: Payer: Self-pay | Admitting: Internal Medicine

## 2019-04-24 ENCOUNTER — Telehealth: Payer: Self-pay | Admitting: Urology

## 2019-04-24 DIAGNOSIS — E785 Hyperlipidemia, unspecified: Secondary | ICD-10-CM | POA: Insufficient documentation

## 2019-04-24 NOTE — Assessment & Plan Note (Signed)
Paced  

## 2019-04-24 NOTE — Assessment & Plan Note (Signed)
Continue ASA

## 2019-04-24 NOTE — Patient Instructions (Signed)

## 2019-04-24 NOTE — Assessment & Plan Note (Signed)
No longer on a statin

## 2019-04-24 NOTE — Telephone Encounter (Signed)
I received a call from someone from [facility] calling on behalf of this patient stating that he does not want to come in for his CT results. He is scheduled for the 22nd for the CT scan and his app his the 29th. He wants to know if you can just call him?    thanks

## 2019-04-24 NOTE — Telephone Encounter (Signed)
He was also due for a comprehensive metabolic panel in addition to the CT.  Just change the office visit to a telephone visit.

## 2019-04-24 NOTE — Assessment & Plan Note (Signed)
Continue Levothyroxine Free T4 yearly

## 2019-04-24 NOTE — Assessment & Plan Note (Signed)
Continue Lisinopril RFP yearly

## 2019-04-24 NOTE — Progress Notes (Signed)
Subjective:    Patient ID: Frank Bartlett, male    DOB: 28-Mar-1926, 85 y.o.   MRN: [id number]  HPI  Resident seen in [address] for routine follow up. No new concerns from staff or resident. He sleeps well despite having some nocturia. Independent with ADL's. Walks with a cane.  Reports poor appetite but 5 lb weight gain in the last 2 months. He denies urinary incontinence. He feels like his bowels sometimes move too much. He c/o intermittent right knee pain but denies chest pain or SOB.  Sick Sinus Syndrome: Paced.  Aflutter: Paroxysmal. He is taking ASA daily.  CKD 3: GFR 38. He is taking Lisinopril as prescribed.  HTN: Controlled on Amlodipine and Lisinopril.  HLD: He is not currently on a statin. He consumes a low fat diet.  Hypothyroidism: He denies any issues on his current dose of Levothyroxine.  OA: Controlled with scheduled Tylenol  Review of Systems      Past Medical History:  Diagnosis Date  . AAA (abdominal aortic aneurysm) ([place])   . Aortic atherosclerosis ([place])   . Atrial fibrillation ([place]) 03/2016   brief spell  . B12 deficiency   . Bradycardia   . Bradycardia 02/2017   Pacer placed  . Chronic kidney disease (CKD), stage III (moderate)   . Hyperlipidemia   . Hypertension   . Hypothyroidism   . Osteoarthritis   . Prostate cancer ([place]) 2001   Rad tx's + seed implants  . Renal cancer, left ([place]) 03/2016   Left Renal Nephrectomy  . Sick sinus syndrome [facility])    Pacemaker    Current Outpatient Medications  Medication Sig Dispense Refill  . acetaminophen (TYLENOL) 500 MG tablet Take 500 mg by mouth daily.    Marland Kitchen amLODipine (NORVASC) 10 MG tablet Take 1 tablet (10 mg total) by mouth daily. 90 tablet 3  . aspirin EC 81 MG tablet Take 81 mg by mouth daily.     . Cyanocobalamin (RA VITAMIN B-12 TR) 1000 MCG TBCR Take 1,000 mcg by mouth daily.     Marland Kitchen levothyroxine (SYNTHROID, LEVOTHROID) 112 MCG tablet Take 112 mcg by mouth daily before breakfast.    .  lisinopril (PRINIVIL,ZESTRIL) 20 MG tablet     . Multiple Vitamin (MULTI-VITAMINS) TABS Take by mouth.     No current facility-administered medications for this visit.    No Known Allergies  Family History  Problem Relation Age of Onset  . Hypertension Mother   . Breast cancer Mother   . Colon cancer Father   . Prostate cancer Neg Hx   . Bladder Cancer Neg Hx   . Kidney cancer Neg Hx     Social History   Socioeconomic History  . Marital status: Widowed    Spouse name: Not on file  . Number of children: Not on file  . Years of education: Not on file  . Highest education level: Not on file  Occupational History  . Occupation: Comptroller/accountant    Comment: Retired  Tobacco Use  . Smoking status: Never Smoker  . Smokeless tobacco: Never Used  Substance and Sexual Activity  . Alcohol use: No    Alcohol/week: 0.0 standard drinks    Comment: rare wine  . Drug use: No  . Sexual activity: Not Currently  Other Topics Concern  . Not on file  Social History Narrative   Widowed 1/18   3 children      Has living will   Son [name] is [facility] POA  Has DNR   No tube feeds if cognitively unaware   Social Determinants of Health   Financial Resource Strain:   . Difficulty of Paying Living Expenses: Not on file  Food Insecurity:   . Worried About Charity fundraiser in the Last Year: Not on file  . Ran Out of Food in the Last Year: Not on file  Transportation Needs:   . Lack of Transportation (Medical): Not on file  . Lack of Transportation (Non-Medical): Not on file  Physical Activity:   . Days of Exercise per Week: Not on file  . Minutes of Exercise per Session: Not on file  Stress:   . Feeling of Stress : Not on file  Social Connections:   . Frequency of Communication with Friends and Family: Not on file  . Frequency of Social Gatherings with Friends and Family: Not on file  . Attends Religious Services: Not on file  . Active Member of Clubs or Organizations: Not on  file  . Attends Archivist Meetings: Not on file  . Marital Status: Not on file  Intimate Partner Violence:   . Fear of Current or Ex-Partner: Not on file  . Emotionally Abused: Not on file  . Physically Abused: Not on file  . Sexually Abused: Not on file     Constitutional: Denies fever, malaise, fatigue, headache or abrupt weight changes.  HEENT: Denies eye pain, eye redness, ear pain, ringing in the ears, wax buildup, runny nose, nasal congestion, bloody nose, or sore throat. Respiratory: Denies difficulty breathing, shortness of breath, cough or sputum production.   Cardiovascular: Denies chest pain, chest tightness, palpitations or swelling in the hands or feet.  Gastrointestinal: Pt reports hernia, loose stools. Denies abdominal pain, bloating, constipation, or blood in the stool.  GU: Pt reports nocturia. Denies urgency, frequency, pain with urination, burning sensation, blood in urine, odor or discharge. Musculoskeletal: Pt reports right knee pain. Denies decrease in range of motion, difficulty with gait, muscle pain or joint swelling.  Skin: Denies redness, rashes, lesions or ulcercations.  Neurological: Denies dizziness, difficulty with memory, difficulty with speech or problems with balance and coordination.  Psych: Denies anxiety, depression, SI/HI.  No other specific complaints in a complete review of systems (except as listed in HPI above).  Objective:   Physical Exam  BP (!) 145/82   Pulse 69   Temp (!) 97.5 F (36.4 C)   Resp 18   Wt 191 lb 6.4 oz (86.8 kg)   BMI 28.26 kg/m  Wt Readings from Last 3 Encounters:  04/24/19 191 lb 6.4 oz (86.8 kg)  12/25/18 186 lb 9.6 oz (84.6 kg)  11/22/18 184 lb 6.4 oz (83.6 kg)    General: Appears his stated age, well developed, well nourished in NAD. Skin: Warm, dry and intact.  HEENT: Head: normal shape and size;  Neck:  Neck supple, trachea midline. No masses, lumps present.  Cardiovascular: Normal rate and  rhythm. Pacemaker noted. Trace BLE edema. Pulmonary/Chest: Normal effort and positive vesicular breath sounds. No respiratory distress. No wheezes, rales or ronchi noted.  Abdomen: Soft and nontender. Normal bowel sounds. Hernia noted. Musculoskeletal: Gait slow and steady with use of the cane. Neurological: Alert and oriented.  Psychiatric: Mood and affect normal. Behavior is normal. Judgment and thought content normal.     BMET    Component Value Date/Time   NA 133 (A) 10/02/2018 0000   K 5.1 10/11/2017 1004   CL 97 10/11/2017 1004   CO2  22 10/11/2017 1004   GLUCOSE 106 (H) 10/11/2017 1004   GLUCOSE 100 (H) 02/20/2017 0407   BUN 24 10/11/2017 1004   CREATININE 1.60 (H) 10/28/2018 1418   CALCIUM 9.3 10/11/2017 1004   GFRNONAA 43 (L) 10/11/2017 1004   GFRAA 50 (L) 10/11/2017 1004    Lipid Panel  No results found for: CHOL, TRIG, HDL, CHOLHDL, VLDL, LDLCALC  CBC    Component Value Date/Time   WBC 6.6 04/17/2017 1031   WBC 5.2 02/20/2017 0407   RBC 3.60 (L) 04/17/2017 1031   RBC 3.78 (L) 02/20/2017 0407   HGB 11.0 (L) 04/17/2017 1031   HCT 33.9 (L) 04/17/2017 1031   PLT 293 04/17/2017 1031   MCV 94 04/17/2017 1031   MCH 30.6 04/17/2017 1031   MCH 32.3 02/20/2017 0407   MCHC 32.4 04/17/2017 1031   MCHC 34.9 02/20/2017 0407   RDW 14.1 04/17/2017 1031   LYMPHSABS 0.5 (L) 03/12/2016 2216   MONOABS 0.6 03/12/2016 2216   EOSABS 0.1 03/12/2016 2216   BASOSABS 0.1 03/12/2016 2216    Hgb A1C No results found for: HGBA1C          Assessment & Plan:   This visit occurred during the SARS-CoV-2 public health emergency.  Safety protocols were in place, including screening questions prior to the visit, additional usage of staff PPE, and extensive cleaning of exam room while observing appropriate contact time as indicated for disinfecting solutions.

## 2019-04-24 NOTE — Assessment & Plan Note (Signed)
Continue scheduled Tylenol

## 2019-04-24 NOTE — Assessment & Plan Note (Signed)
Continue Amlodipine and Lisinopril

## 2019-04-27 DIAGNOSIS — I495 Sick sinus syndrome: Secondary | ICD-10-CM | POA: Diagnosis not present

## 2019-04-27 NOTE — Telephone Encounter (Signed)
I changed this to a telephone visit he is checking to see if he can have his labs drawn at [facility] and said he would call me back   [place]

## 2019-04-28 ENCOUNTER — Ambulatory Visit: Payer: Medicare Other

## 2019-04-28 ENCOUNTER — Ambulatory Visit
Admission: RE | Admit: 2019-04-28 | Discharge: 2019-04-28 | Disposition: A | Discharge status: Home / Self Care | Payer: Medicare Other | Source: Ambulatory Visit | Attending: Urology | Admitting: Urology

## 2019-04-28 ENCOUNTER — Other Ambulatory Visit: Payer: Medicare Other

## 2019-04-28 ENCOUNTER — Other Ambulatory Visit: Payer: Self-pay

## 2019-04-28 ENCOUNTER — Other Ambulatory Visit: Payer: Self-pay | Admitting: Urology

## 2019-04-28 DIAGNOSIS — K753 Granulomatous hepatitis, not elsewhere classified: Secondary | ICD-10-CM | POA: Diagnosis not present

## 2019-04-28 DIAGNOSIS — K469 Unspecified abdominal hernia without obstruction or gangrene: Secondary | ICD-10-CM | POA: Insufficient documentation

## 2019-04-28 DIAGNOSIS — I700 Atherosclerosis of aorta: Secondary | ICD-10-CM | POA: Diagnosis not present

## 2019-04-28 DIAGNOSIS — K5730 Diverticulosis of large intestine without perforation or abscess without bleeding: Secondary | ICD-10-CM | POA: Diagnosis not present

## 2019-04-28 DIAGNOSIS — C649 Malignant neoplasm of unspecified kidney, except renal pelvis: Secondary | ICD-10-CM

## 2019-04-28 DIAGNOSIS — K862 Cyst of pancreas: Secondary | ICD-10-CM | POA: Insufficient documentation

## 2019-04-28 DIAGNOSIS — C641 Malignant neoplasm of right kidney, except renal pelvis: Secondary | ICD-10-CM | POA: Diagnosis not present

## 2019-04-28 DIAGNOSIS — Z905 Acquired absence of kidney: Secondary | ICD-10-CM | POA: Insufficient documentation

## 2019-04-28 DIAGNOSIS — J984 Other disorders of lung: Secondary | ICD-10-CM | POA: Diagnosis not present

## 2019-04-28 LAB — POCT I-STAT CREATININE: Creatinine, Ser: 2.2 mg/dL — ABNORMAL HIGH (ref 0.61–1.24)

## 2019-04-28 MED ORDER — IOHEXOL 300 MG/ML  SOLN: 80.0000 mL | Freq: Once | INTRAMUSCULAR | Status: DC | PRN

## 2019-04-30 DIAGNOSIS — N189 Chronic kidney disease, unspecified: Secondary | ICD-10-CM | POA: Diagnosis not present

## 2019-05-05 ENCOUNTER — Other Ambulatory Visit: Payer: Self-pay

## 2019-05-05 ENCOUNTER — Ambulatory Visit: Payer: Medicare Other | Admitting: Urology

## 2019-05-05 ENCOUNTER — Telehealth (INDEPENDENT_AMBULATORY_CARE_PROVIDER_SITE_OTHER): Payer: Medicare Other | Admitting: Urology

## 2019-05-05 DIAGNOSIS — C649 Malignant neoplasm of unspecified kidney, except renal pelvis: Secondary | ICD-10-CM | POA: Diagnosis not present

## 2019-05-06 ENCOUNTER — Ambulatory Visit: Payer: Medicare Other | Admitting: Urology

## 2019-05-06 NOTE — Progress Notes (Signed)
Virtual Visit via Telephone Note  I connected with Frank Bartlett on 05/07/19 at 1310 EST by telephone and verified that I am speaking with the correct person using two identifiers.  Location: Patient: Home Provider: Private office   I discussed the limitations, risks, security and privacy concerns of performing an evaluation and management service by telephone and the availability of in person appointments. I also discussed with the patient that there may be a patient responsible charge related to this service. The patient expressed understanding and agreed to proceed.   History of Present Illness:  Urologic History 1. History of Kidney Cancer - Laparoscopic hand-assisted left radical nephrectomy 03/08/2016. Complicated by c. Diff post-op - T3a Renal Cell Carcinoma, chromophobe type (03/2016). Tumor invasing renal vein and collecting system. Negative margins  2.PCa -He underwent brachytherapyin 2001.  -His PSA is undetectable- last one Dec 2018.  Mr. Gara had recent imaging and blood work for renal cell carcinoma follow-up.  He had no complaints.  His CT chest/abdomen/pelvis was performed without contrast as creatinine was 2.2.  The scan showed no evidence of recurrent disease and were stable.  He had blood work drawn at [facility] after his scan and creatinine was back to baseline at 1.7.  Liver function tests were normal.   Observations/Objective: N/A  Assessment and Plan: History of renal cell carcinoma without evidence of recurrent disease.  Follow Up Instructions: Based on monitoring guidelines can move to annual surveillance.   I discussed the assessment and treatment plan with the patient. The patient was provided an opportunity to ask questions and all were answered. The patient agreed with the plan and demonstrated an understanding of the instructions.   The patient was advised to call back or seek  an in-person evaluation if the symptoms worsen or if the condition fails to improve as anticipated.  I provided 5 minutes of non-face-to-face time during this encounter.   Abbie Sons, MD

## 2019-05-07 ENCOUNTER — Telehealth: Payer: Self-pay | Admitting: Urology

## 2019-05-07 ENCOUNTER — Ambulatory Visit: Payer: Medicare Other | Admitting: Urology

## 2019-05-07 ENCOUNTER — Other Ambulatory Visit: Payer: Self-pay

## 2019-05-07 NOTE — Telephone Encounter (Signed)
Pt would like a call back with CT results.

## 2019-05-07 NOTE — Telephone Encounter (Signed)
Patient was contacted  

## 2019-05-11 ENCOUNTER — Telehealth: Payer: Self-pay | Admitting: Urology

## 2019-05-11 DIAGNOSIS — C649 Malignant neoplasm of unspecified kidney, except renal pelvis: Secondary | ICD-10-CM

## 2019-05-11 NOTE — Telephone Encounter (Signed)
Can you put in the order for the CT scan?

## 2019-05-14 DIAGNOSIS — L578 Other skin changes due to chronic exposure to nonionizing radiation: Secondary | ICD-10-CM | POA: Diagnosis not present

## 2019-05-14 DIAGNOSIS — L98499 Non-pressure chronic ulcer of skin of other sites with unspecified severity: Secondary | ICD-10-CM | POA: Diagnosis not present

## 2019-05-14 DIAGNOSIS — Z85828 Personal history of other malignant neoplasm of skin: Secondary | ICD-10-CM | POA: Diagnosis not present

## 2019-05-20 DIAGNOSIS — H905 Unspecified sensorineural hearing loss: Secondary | ICD-10-CM | POA: Diagnosis not present

## 2019-07-01 ENCOUNTER — Other Ambulatory Visit: Payer: Self-pay

## 2019-07-01 NOTE — Patient Outreach (Signed)
[place] [facility]) Care Management  07/01/2019  Frank Bartlett [birth year] [id number]   Medication Adherence call to Frank Bartlett Compliant Voice message left with a call back number. Frank Bartlett is showing past due on Lisinopril 20 mg under [place].   [place] Management Direct Dial [phone]  Fax [phone] [name]

## 2019-07-07 DIAGNOSIS — L570 Actinic keratosis: Secondary | ICD-10-CM | POA: Diagnosis not present

## 2019-07-07 DIAGNOSIS — Z872 Personal history of diseases of the skin and subcutaneous tissue: Secondary | ICD-10-CM | POA: Diagnosis not present

## 2019-07-07 DIAGNOSIS — L578 Other skin changes due to chronic exposure to nonionizing radiation: Secondary | ICD-10-CM | POA: Diagnosis not present

## 2019-07-07 DIAGNOSIS — Z85828 Personal history of other malignant neoplasm of skin: Secondary | ICD-10-CM | POA: Diagnosis not present

## 2019-07-08 DIAGNOSIS — Z961 Presence of intraocular lens: Secondary | ICD-10-CM | POA: Diagnosis not present

## 2019-08-06 ENCOUNTER — Ambulatory Visit: Payer: Medicare Other | Admitting: Internal Medicine

## 2019-08-06 ENCOUNTER — Other Ambulatory Visit: Payer: Self-pay

## 2019-08-06 ENCOUNTER — Encounter: Payer: Self-pay | Admitting: Internal Medicine

## 2019-08-06 VITALS — BP 130/80 | HR 70 | Temp 97.2°F | Resp 16 | Wt 191.2 lb

## 2019-08-06 DIAGNOSIS — F39 Unspecified mood [affective] disorder: Secondary | ICD-10-CM | POA: Diagnosis not present

## 2019-08-06 DIAGNOSIS — I4892 Unspecified atrial flutter: Secondary | ICD-10-CM | POA: Diagnosis not present

## 2019-08-06 DIAGNOSIS — I700 Atherosclerosis of aorta: Secondary | ICD-10-CM

## 2019-08-06 DIAGNOSIS — R197 Diarrhea, unspecified: Secondary | ICD-10-CM | POA: Diagnosis not present

## 2019-08-06 DIAGNOSIS — R194 Change in bowel habit: Secondary | ICD-10-CM | POA: Insufficient documentation

## 2019-08-06 DIAGNOSIS — I495 Sick sinus syndrome: Secondary | ICD-10-CM | POA: Diagnosis not present

## 2019-08-06 DIAGNOSIS — I714 Abdominal aortic aneurysm, without rupture, unspecified: Secondary | ICD-10-CM

## 2019-08-06 DIAGNOSIS — R195 Other fecal abnormalities: Secondary | ICD-10-CM

## 2019-08-06 DIAGNOSIS — M1711 Unilateral primary osteoarthritis, right knee: Secondary | ICD-10-CM

## 2019-08-06 DIAGNOSIS — R109 Unspecified abdominal pain: Secondary | ICD-10-CM | POA: Diagnosis not present

## 2019-08-06 NOTE — Assessment & Plan Note (Signed)
Mostly right knee Doing okay with tylenol and topical diclofenac Discussed--but will hold off on joint injection

## 2019-08-06 NOTE — Assessment & Plan Note (Signed)
Also seen on CT scan done for RCC follow up Considering his age, no action (but is on ASA)

## 2019-08-06 NOTE — Assessment & Plan Note (Signed)
Stable size on recent CT scan No action needed

## 2019-08-06 NOTE — Assessment & Plan Note (Signed)
Has pacemaker No symptoms Low dose ASA

## 2019-08-06 NOTE — Progress Notes (Signed)
Subjective:    Patient ID: Frank Bartlett, male    DOB: 12-Feb-1926, 86 y.o.   MRN: [id number]  HPI Visit in assisted living apartment for review of chronic health conditions Reviewed status with Luellen Pucker RN  Having trouble with his "digestive tract" Has loose stools and oozing  Kaopectate not really helpful Will move bowels when he sits to void at night No pain No blood in stool--but sometimes on toilet paper from hemorrhoids Going on for a couple of weeks---had been taking laxatives till then (miralax) Wears diaper---some urinary and occasional fecal incontinence (without knowing it) Did have past RT---but no blood, etc  Has had follow up about renal cell cancer Repeat CT scan does not show signs of recurrence Did note aortic atherosclerosis and stability of known AAA (3.6 x 3.0cm) Still has ventral hernia--no pain  Having trouble with his right knee Was a runner into his 18's Has bothered him for many years---arthritis Had TKR planned--but improved when he changed to just walking instead of running Now feeling it more recently---hears it cracking Using diclofenac gel---uses it in AM and sometimes a second time. Also uses tylenol  Known CKD Monitored here periodically  No chest pain No SOB Has pacemaker--monitored by Dr Saralyn Pilar due to SSS No dizziness or syncope Walks with walker  Has had some depressed mood Was married for 47 years and with her 82 years Widowed for over 2 years--still bothers him  Satisfied with life/family--but misses her Still adjusting to "a whole new life" "I don't have a real exuberance"--hoping that it will improve eventually Does enjoy weekly hymms Definitely lonely---COVID hasn't helped  Current Outpatient Medications on File Prior to Visit  Medication Sig Dispense Refill  . acetaminophen (TYLENOL) 500 MG tablet Take 500 mg by mouth daily.    Marland Kitchen amLODipine (NORVASC) 10 MG tablet Take 1 tablet (10 mg total) by mouth daily. 90 tablet 3    . aspirin EC 81 MG tablet Take 81 mg by mouth daily.     . Cyanocobalamin (RA VITAMIN B-12 TR) 1000 MCG TBCR Take 1,000 mcg by mouth daily.     Marland Kitchen levothyroxine (SYNTHROID, LEVOTHROID) 112 MCG tablet Take 112 mcg by mouth daily before breakfast.    . lisinopril (PRINIVIL,ZESTRIL) 20 MG tablet     . Multiple Vitamin (MULTI-VITAMINS) TABS Take by mouth.     No current facility-administered medications on file prior to visit.    No Known Allergies  Past Medical History:  Diagnosis Date  . AAA (abdominal aortic aneurysm) ([place])   . Aortic atherosclerosis ([place])   . Atrial fibrillation ([place]) 03/2016   brief spell  . B12 deficiency   . Bradycardia   . Bradycardia 02/2017   Pacer placed  . Chronic kidney disease (CKD), stage III (moderate)   . Hyperlipidemia   . Hypertension   . Hypothyroidism   . Osteoarthritis   . Prostate cancer ([place]) 2001   Rad tx's + seed implants  . Renal cancer, left ([place]) 03/2016   Left Renal Nephrectomy  . Sick sinus syndrome [facility])    Pacemaker    Past Surgical History:  Procedure Laterality Date  . CATARACT EXTRACTION, BILATERAL    . COLONOSCOPY    . EXCISIONAL HEMORRHOIDECTOMY    . INSERTION PROSTATE RADIATION SEED    . LAPAROSCOPIC NEPHRECTOMY, HAND ASSISTED Left 03/08/2016   Procedure: HAND ASSISTED LAPAROSCOPIC NEPHRECTOMY;  Surgeon: Nickie Retort, MD;  Location: [facility] OR[facility];  Service: Urology;  Laterality: Left;  . LAPAROTOMY N/A  03/13/2016   Procedure: EXPLORATORY LAPAROTOMY;  Surgeon: Festus Aloe, MD;  Location: [facility] OR[facility];  Service: Urology;  Laterality: N/A;  . PACEMAKER INSERTION N/A 02/20/2017   Procedure: INSERTION PACEMAKER;  Surgeon: Isaias Cowman, MD;  Location: [facility] OR[facility];  Service: Cardiovascular;  Laterality: N/A;    Family History  Problem Relation Age of Onset  . Hypertension Mother   . Breast cancer Mother   . Colon cancer Father   . Prostate cancer Neg Hx   . Bladder Cancer Neg Hx   . Kidney cancer Neg Hx      Social History   Socioeconomic History  . Marital status: Widowed    Spouse name: Not on file  . Number of children: Not on file  . Years of education: Not on file  . Highest education level: Not on file  Occupational History  . Occupation: Comptroller/accountant    Comment: Retired  Tobacco Use  . Smoking status: Never Smoker  . Smokeless tobacco: Never Used  Substance and Sexual Activity  . Alcohol use: No    Alcohol/week: 0.0 standard drinks    Comment: rare wine  . Drug use: No  . Sexual activity: Not Currently  Other Topics Concern  . Not on file  Social History Narrative   Widowed 1/18   3 children      Has living will   Son [name] is [facility] POA   Has DNR   No tube feeds if cognitively unaware   Social Determinants of Health   Financial Resource Strain:   . Difficulty of Paying Living Expenses:   Food Insecurity:   . Worried About Charity fundraiser in the Last Year:   . Arboriculturist in the Last Year:   Transportation Needs:   . Film/video editor (Medical):   Marland Kitchen Lack of Transportation (Non-Medical):   Physical Activity:   . Days of Exercise per Week:   . Minutes of Exercise per Session:   Stress:   . Feeling of Stress :   Social Connections:   . Frequency of Communication with Friends and Family:   . Frequency of Social Gatherings with Friends and Family:   . Attends Religious Services:   . Active Member of Clubs or Organizations:   . Attends Archivist Meetings:   Marland Kitchen Marital Status:   Intimate Partner Violence:   . Fear of Current or Ex-Partner:   . Emotionally Abused:   Marland Kitchen Physically Abused:   . Sexually Abused:    Review of Systems Not much appetite --since C diff prolonged infection a few years ago Weight continues to go up some Sleeps okay--in bed by 8-9PM Nocturia x 2-3    Objective:   Physical Exam  Constitutional: He appears well-developed. No distress.  Neck: No thyromegaly present.  Cardiovascular: Normal rate,  regular rhythm and normal heart sounds. Exam reveals no gallop.  No murmur heard. Respiratory: Effort normal and breath sounds normal. No respiratory distress. He has no wheezes. He has no rales.  Musculoskeletal:        General: No edema.     Comments: Right knee without effusion Fair passive ROM  Mild crepitus No ligament or meniscus findings  Lymphadenopathy:    He has no cervical adenopathy.  Skin: No rash noted.  Psychiatric:  No overt depression Normal appearance and speech           Assessment & Plan:

## 2019-08-06 NOTE — Assessment & Plan Note (Signed)
Reassured Symptoms don't suggest recurrence of C diff Hard to tell if just loose stools (and could consider cholestyramine) or obstipation with little bits of stool the only thing that can come out (in which case a clean out would be appropriate) Will check KUB to try to clarify

## 2019-08-06 NOTE — Assessment & Plan Note (Signed)
Happened post op No evidence of recurrence

## 2019-08-06 NOTE — Assessment & Plan Note (Signed)
Dysthymia/extended grieving Daily note of depressed mood by staff Will try low dose sertraline

## 2019-08-27 ENCOUNTER — Other Ambulatory Visit: Payer: Self-pay

## 2019-08-27 ENCOUNTER — Ambulatory Visit: Payer: Medicare Other | Admitting: Internal Medicine

## 2019-08-27 ENCOUNTER — Encounter: Payer: Self-pay | Admitting: Internal Medicine

## 2019-08-27 DIAGNOSIS — F39 Unspecified mood [affective] disorder: Secondary | ICD-10-CM

## 2019-08-27 DIAGNOSIS — R194 Change in bowel habit: Secondary | ICD-10-CM | POA: Diagnosis not present

## 2019-08-27 NOTE — Assessment & Plan Note (Signed)
Ongoing anxiety Stressed by likely spam attempts on computer---will see if son can take over finances (to reduce anxiety) Will try increasing sertaline

## 2019-08-27 NOTE — Assessment & Plan Note (Signed)
Had been having diarrhea--now slow and caliber changed Some urgency No blood recently Will go ahead with GI evaluation to be sure further evaluation is not needed (probably not)

## 2019-08-27 NOTE — Progress Notes (Signed)
Subjective:    Patient ID: Frank Bartlett, male    DOB: [birth year], 86 y.o.   MRN: [id number]  HPI Asked to see by resident and staff due to multiple concerns Reviewed status with Luellen Pucker RN  "I get frustrated easily---that is more of a mental thing" Still stressing about his digestive tract When up at night for nocturia, will try to move bowels----usually just gas and maybe a pellet Fairly good stools are infrequent Diarrhea is better with fiber. No blood Notes his stools are narrowed--wonders if he needs colonoscopy to be sure he doesn't have cancer  Frustrated with a site on the computer This affects him--concerned about being scammed  Current Outpatient Medications on File Prior to Visit  Medication Sig Dispense Refill  . acetaminophen (TYLENOL) 500 MG tablet Take 500 mg by mouth daily.    Marland Kitchen amLODipine (NORVASC) 10 MG tablet Take 1 tablet (10 mg total) by mouth daily. 90 tablet 3  . aspirin EC 81 MG tablet Take 81 mg by mouth daily.     . Cyanocobalamin (RA VITAMIN B-12 TR) 1000 MCG TBCR Take 1,000 mcg by mouth daily.     Marland Kitchen levothyroxine (SYNTHROID, LEVOTHROID) 112 MCG tablet Take 112 mcg by mouth daily before breakfast.    . lisinopril (PRINIVIL,ZESTRIL) 20 MG tablet     . Multiple Vitamin (MULTI-VITAMINS) TABS Take by mouth.    . sertraline (ZOLOFT) 25 MG tablet Take 25 mg by mouth daily.     No current facility-administered medications on file prior to visit.    No Known Allergies  Past Medical History:  Diagnosis Date  . AAA (abdominal aortic aneurysm) ([place])   . Aortic atherosclerosis ([place])   . Atrial fibrillation ([place]) 03/2016   brief spell  . B12 deficiency   . Bradycardia   . Bradycardia 02/2017   Pacer placed  . Chronic kidney disease (CKD), stage III (moderate)   . Hyperlipidemia   . Hypertension   . Hypothyroidism   . Osteoarthritis   . Prostate cancer ([place]) 2001   Rad tx's + seed implants  . Renal cancer, left ([place]) 03/2016   Left Renal  Nephrectomy  . Sick sinus syndrome [facility])    Pacemaker    Past Surgical History:  Procedure Laterality Date  . CATARACT EXTRACTION, BILATERAL    . COLONOSCOPY    . EXCISIONAL HEMORRHOIDECTOMY    . INSERTION PROSTATE RADIATION SEED    . LAPAROSCOPIC NEPHRECTOMY, HAND ASSISTED Left 03/08/2016   Procedure: HAND ASSISTED LAPAROSCOPIC NEPHRECTOMY;  Surgeon: Nickie Retort, MD;  Location: [facility];  Service: Urology;  Laterality: Left;  . LAPAROTOMY N/A 03/13/2016   Procedure: EXPLORATORY LAPAROTOMY;  Surgeon: Festus Aloe, MD;  Location: [facility];  Service: Urology;  Laterality: N/A;  . PACEMAKER INSERTION N/A 02/20/2017   Procedure: INSERTION PACEMAKER;  Surgeon: Isaias Cowman, MD;  Location: [facility];  Service: Cardiovascular;  Laterality: N/A;    Family History  Problem Relation Age of Onset  . Hypertension Mother   . Breast cancer Mother   . Colon cancer Father   . Prostate cancer Neg Hx   . Bladder Cancer Neg Hx   . Kidney cancer Neg Hx     Social History   Socioeconomic History  . Marital status: Widowed    Spouse name: Not on file  . Number of children: Not on file  . Years of education: Not on file  . Highest education level: Not on file  Occupational History  . Occupation: Comptroller/accountant  Comment: Retired  Tobacco Use  . Smoking status: Never Smoker  . Smokeless tobacco: Never Used  Substance and Sexual Activity  . Alcohol use: No    Alcohol/week: 0.0 standard drinks    Comment: rare wine  . Drug use: No  . Sexual activity: Not Currently  Other Topics Concern  . Not on file  Social History Narrative   Widowed 1/18   3 children      Has living will   Son [name] is [facility] POA   Has DNR   No tube feeds if cognitively unaware   Social Determinants of Health   Financial Resource Strain:   . Difficulty of Paying Living Expenses:   Food Insecurity:   . Worried About Charity fundraiser in the Last Year:   . Arboriculturist in the Last  Year:   Transportation Needs:   . Film/video editor (Medical):   Marland Kitchen Lack of Transportation (Non-Medical):   Physical Activity:   . Days of Exercise per Week:   . Minutes of Exercise per Session:   Stress:   . Feeling of Stress :   Social Connections:   . Frequency of Communication with Friends and Family:   . Frequency of Social Gatherings with Friends and Family:   . Attends Religious Services:   . Active Member of Clubs or Organizations:   . Attends Archivist Meetings:   Marland Kitchen Marital Status:   Intimate Partner Violence:   . Fear of Current or Ex-Partner:   . Emotionally Abused:   Marland Kitchen Physically Abused:   . Sexually Abused:    Review of Systems Appetite hasn't been the same since distant C diff infection--but eats regularly Weight is stable    Objective:   Physical Exam  Constitutional: No distress.  Psychiatric:  Mildly anxious Not depressed           Assessment & Plan:

## 2019-09-22 DIAGNOSIS — I495 Sick sinus syndrome: Secondary | ICD-10-CM | POA: Diagnosis not present

## 2019-09-22 DIAGNOSIS — K5909 Other constipation: Secondary | ICD-10-CM | POA: Diagnosis not present

## 2019-10-01 ENCOUNTER — Encounter: Payer: Self-pay | Admitting: Internal Medicine

## 2019-10-01 DIAGNOSIS — E039 Hypothyroidism, unspecified: Secondary | ICD-10-CM | POA: Diagnosis not present

## 2019-10-01 DIAGNOSIS — N189 Chronic kidney disease, unspecified: Secondary | ICD-10-CM | POA: Diagnosis not present

## 2019-10-19 ENCOUNTER — Ambulatory Visit: Payer: Medicare Other | Admitting: Dermatology

## 2019-11-16 ENCOUNTER — Ambulatory Visit: Payer: Medicare Other | Admitting: Dermatology

## 2019-11-16 ENCOUNTER — Other Ambulatory Visit: Payer: Self-pay

## 2019-11-16 DIAGNOSIS — L820 Inflamed seborrheic keratosis: Secondary | ICD-10-CM

## 2019-11-16 DIAGNOSIS — L570 Actinic keratosis: Secondary | ICD-10-CM

## 2019-11-16 DIAGNOSIS — L578 Other skin changes due to chronic exposure to nonionizing radiation: Secondary | ICD-10-CM | POA: Diagnosis not present

## 2019-11-16 NOTE — Patient Instructions (Signed)
Recommend broad spectrum SPF and photoprotection  Wound Care Instructions  1. Cleanse wound gently with soap and water once a day then pat dry with clean gauze. Apply a thing coat of Petrolatum (petroleum jelly, "Vaseline") over the wound (unless you have an allergy to this). We recommend that you use a new, sterile tube of Vaseline. Do not pick or remove scabs. Do not remove the yellow or white "healing tissue" from the base of the wound.  2. Cover the wound with fresh, clean, nonstick gauze and secure with paper tape. You may use Band-Aids in place of gauze and tape if the would is small enough, but would recommend trimming much of the tape off as there is often too much. Sometimes Band-Aids can irritate the skin.  3. You should call the office for your biopsy report after 1 week if you have not already been contacted.  4. If you experience any problems, such as abnormal amounts of bleeding, swelling, significant bruising, significant pain, or evidence of infection, please call the office immediately.  5. FOR ADULT SURGERY PATIENTS: If you need something for pain relief you may take 1 extra strength Tylenol (acetaminophen) AND 2 Ibuprofen (200mg  each) together every 4 hours as needed for pain. (do not take these if you are allergic to them or if you have a reason you should not take them.) Typically, you may only need pain medication for 1 to 3 days.

## 2019-11-16 NOTE — Progress Notes (Signed)
   Follow-Up Visit   Subjective  Frank Bartlett is a 86 y.o. male who presents for the following: Follow-up ([place] follow up of face and ears treated with LN2 x 16 07/2019).  The following portions of the chart were reviewed this encounter and updated as appropriate:  Tobacco  Allergies  Meds  Problems  Med Hx  Surg Hx  Fam Hx     Review of Systems:  No other skin or systemic complaints except as noted in HPI or Assessment and Plan.  Objective  Well appearing patient in no apparent distress; mood and affect are within normal limits.  A focused examination was performed including face, scalp, ears, arms, hands. Relevant physical exam findings are noted in the Assessment and Plan.  Objective  Face, scalp, ears (8): Erythematous thin papules/macules with gritty scale.   Objective  Face, scalp: Diffuse scaly erythematous macules with underlying dyspigmentation.   Objective  Right post scalp x 1, hands/arms x 5 (6): Erythematous keratotic or waxy stuck-on papule or plaque.    Assessment & Plan    AK (actinic keratosis) (8) Face, scalp, ears  Destruction of lesion - Face, scalp, ears Complexity: simple   Destruction method: cryotherapy   Informed consent: discussed and consent obtained   Timeout:  patient name, date of birth, surgical site, and procedure verified Lesion destroyed using liquid nitrogen: Yes   Region frozen until ice ball extended beyond lesion: Yes   Outcome: patient tolerated procedure well with no complications   Post-procedure details: wound care instructions given    Actinic skin damage Face, scalp  Recommend broad spectrum SPF and photoprotection   Inflamed seborrheic keratosis (6) Right post scalp x 1, hands/arms x 5  Destruction of lesion - Right post scalp x 1, hands/arms x 5 Complexity: simple   Destruction method: cryotherapy   Informed consent: discussed and consent obtained   Timeout:  patient name, date of birth, surgical site, and  procedure verified Lesion destroyed using liquid nitrogen: Yes   Region frozen until ice ball extended beyond lesion: Yes   Outcome: patient tolerated procedure well with no complications   Post-procedure details: wound care instructions given    Return in about 4 months (around 03/18/2020).  I, Ashok Cordia, CMA, am acting as scribe for Sarina Ser, MD .  Documentation: I have reviewed the above documentation for accuracy and completeness, and I agree with the above.  Sarina Ser, MD

## 2019-11-17 ENCOUNTER — Encounter: Payer: Self-pay | Admitting: Dermatology

## 2019-11-23 ENCOUNTER — Telehealth: Payer: Self-pay

## 2019-11-23 NOTE — Telephone Encounter (Signed)
Pt's nurse Luellen Pucker called and asked that we fax pt's lab orders to her at pt's nursing home in order to save the pt from coming in for a lab appt in December. Can you please advise on what labs are needed so that orders can be faxed. Thanks

## 2019-11-23 NOTE — Telephone Encounter (Signed)
Orders to be faxed to ATTN: Luellen Pucker at [phone]

## 2019-11-24 ENCOUNTER — Other Ambulatory Visit: Payer: Self-pay | Admitting: *Deleted

## 2019-11-24 DIAGNOSIS — C649 Malignant neoplasm of unspecified kidney, except renal pelvis: Secondary | ICD-10-CM

## 2019-11-24 NOTE — Progress Notes (Unsigned)
cmo

## 2019-11-24 NOTE — Telephone Encounter (Signed)
He will need a comprehensive metabolic panel, CBC.  Orders for CT of the chest and abdomen/pelvis have already been placed

## 2019-11-25 ENCOUNTER — Ambulatory Visit: Payer: Medicare Other | Admitting: Internal Medicine

## 2019-11-25 ENCOUNTER — Other Ambulatory Visit: Payer: Self-pay

## 2019-11-25 VITALS — BP 125/72 | HR 66 | Temp 97.4°F | Resp 18 | Wt 182.6 lb

## 2019-11-25 DIAGNOSIS — I714 Abdominal aortic aneurysm, without rupture, unspecified: Secondary | ICD-10-CM

## 2019-11-25 DIAGNOSIS — N1832 Chronic kidney disease, stage 3b: Secondary | ICD-10-CM

## 2019-11-25 DIAGNOSIS — M1711 Unilateral primary osteoarthritis, right knee: Secondary | ICD-10-CM

## 2019-11-25 DIAGNOSIS — I10 Essential (primary) hypertension: Secondary | ICD-10-CM

## 2019-11-25 DIAGNOSIS — I495 Sick sinus syndrome: Secondary | ICD-10-CM | POA: Diagnosis not present

## 2019-11-25 DIAGNOSIS — I4892 Unspecified atrial flutter: Secondary | ICD-10-CM | POA: Diagnosis not present

## 2019-11-25 DIAGNOSIS — E039 Hypothyroidism, unspecified: Secondary | ICD-10-CM

## 2019-11-25 DIAGNOSIS — H01024 Squamous blepharitis left upper eyelid: Secondary | ICD-10-CM | POA: Diagnosis not present

## 2019-11-25 DIAGNOSIS — I700 Atherosclerosis of aorta: Secondary | ICD-10-CM

## 2019-11-25 DIAGNOSIS — F39 Unspecified mood [affective] disorder: Secondary | ICD-10-CM

## 2019-11-26 ENCOUNTER — Encounter: Payer: Self-pay | Admitting: Internal Medicine

## 2019-11-26 NOTE — Patient Instructions (Signed)

## 2019-11-26 NOTE — Progress Notes (Signed)
Subjective:    Patient ID: Frank Bartlett, male    DOB: December 16, 1925, 86 y.o.   MRN: [id number]  HPI  Resident seen in [address] for routine follow up. No new concerns from staff.  Resident reports left upper eyelid swelling for a few days. No pain, vision changes. Otherwise has been doing well. Sleeps well. Walks with a cane in the room, rollater for longer distances. Independent with ADL's. Appetite good, weight down 4 lbs. Bowels okay, on urinary leakage but does have some nocturia. He report intermittent knee pain, controlled with Tylenol. He denies recent falls. He tries to walk and lift weights when he can.  SSS: Paced, no issues.  Aflutter: Paroxysmal. Managed on ASA daily.  CKD 3: GFR 37. He is on Lisinopril for renal protection.   HTN: BP controlled on Amlodipine and Lisinopril.  Mood D/O: He denies any issues on his current dose of Sertraline.  Hypothyroidism: He is taking Levothyroxine as prescribed.   OA: Mainly in his knees. Pain controlled with Tylenol.  Review of Systems      Past Medical History:  Diagnosis Date  . AAA (abdominal aortic aneurysm) ([place])   . Actinic keratosis   . Aortic atherosclerosis ([place])   . Atrial fibrillation ([place]) 03/2016   brief spell  . B12 deficiency   . Basal cell carcinoma 10/30/2016   L lat crown  . Bradycardia   . Bradycardia 02/2017   Pacer placed  . Chronic kidney disease (CKD), stage III (moderate)   . Hyperlipidemia   . Hypertension   . Hypothyroidism   . Osteoarthritis   . Prostate cancer ([place]) 2001   Rad tx's + seed implants  . Renal cancer, left ([place]) 03/2016   Left Renal Nephrectomy  . Sick sinus syndrome [facility])    Pacemaker    Current Outpatient Medications  Medication Sig Dispense Refill  . acetaminophen (TYLENOL) 500 MG tablet Take 500 mg by mouth daily.    Marland Kitchen amLODipine (NORVASC) 10 MG tablet Take 1 tablet (10 mg total) by mouth daily. 90 tablet 3  . aspirin EC 81 MG tablet Take 81 mg by mouth daily.       . Cyanocobalamin (RA VITAMIN B-12 TR) 1000 MCG TBCR Take 1,000 mcg by mouth daily.     Marland Kitchen levothyroxine (SYNTHROID, LEVOTHROID) 112 MCG tablet Take 112 mcg by mouth daily before breakfast.    . lisinopril (PRINIVIL,ZESTRIL) 20 MG tablet     . sertraline (ZOLOFT) 25 MG tablet Take 50 mg by mouth daily.    . Multiple Vitamin (MULTI-VITAMINS) TABS Take by mouth.     No current facility-administered medications for this visit.    No Known Allergies  Family History  Problem Relation Age of Onset  . Hypertension Mother   . Breast cancer Mother   . Colon cancer Father   . Prostate cancer Neg Hx   . Bladder Cancer Neg Hx   . Kidney cancer Neg Hx     Social History   Socioeconomic History  . Marital status: Widowed    Spouse name: Not on file  . Number of children: Not on file  . Years of education: Not on file  . Highest education level: Not on file  Occupational History  . Occupation: Comptroller/accountant    Comment: Retired  Tobacco Use  . Smoking status: Never Smoker  . Smokeless tobacco: Never Used  Substance and Sexual Activity  . Alcohol use: No    Alcohol/week: 0.0 standard drinks  Comment: rare wine  . Drug use: No  . Sexual activity: Not Currently  Other Topics Concern  . Not on file  Social History Narrative   Widowed 1/18   3 children      Has living will   Son [name] is [facility] POA   Has DNR   No tube feeds if cognitively unaware   Social Determinants of Health   Financial Resource Strain:   . Difficulty of Paying Living Expenses:   Food Insecurity:   . Worried About Charity fundraiser in the Last Year:   . Arboriculturist in the Last Year:   Transportation Needs:   . Film/video editor (Medical):   Marland Kitchen Lack of Transportation (Non-Medical):   Physical Activity:   . Days of Exercise per Week:   . Minutes of Exercise per Session:   Stress:   . Feeling of Stress :   Social Connections:   . Frequency of Communication with Friends and Family:   .  Frequency of Social Gatherings with Friends and Family:   . Attends Religious Services:   . Active Member of Clubs or Organizations:   . Attends Archivist Meetings:   Marland Kitchen Marital Status:   Intimate Partner Violence:   . Fear of Current or Ex-Partner:   . Emotionally Abused:   Marland Kitchen Physically Abused:   . Sexually Abused:      Constitutional: Denies fever, malaise, fatigue, headache or abrupt weight changes.  HEENT: Pt reports left upper eyelid swelling. Denies eye pain, eye redness, ear pain, ringing in the ears, wax buildup, runny nose, nasal congestion, bloody nose, or sore throat. Respiratory: Denies difficulty breathing, shortness of breath, cough or sputum production.   Cardiovascular: Denies chest pain, chest tightness, palpitations or swelling in the hands or feet.  Gastrointestinal: Denies abdominal pain, bloating, constipation, diarrhea or blood in the stool.  GU: Denies urgency, frequency, pain with urination, burning sensation, blood in urine, odor or discharge. Musculoskeletal: Pt reports intermittent knee pain. Denies decrease in range of motion, difficulty with gait, muscle pain or joint swelling.  Skin: Denies redness, rashes, lesions or ulcercations.  Neurological: Denies dizziness, difficulty with memory, difficulty with speech or problems with balance and coordination.  Psych: Pt reports history of mood issues. Denies anxiety, depression, SI/HI.  No other specific complaints in a complete review of systems (except as listed in HPI above).  Objective:   Physical Exam  BP 125/72   Pulse 66   Temp (!) 97.4 F (36.3 C)   Resp 18   Wt 182 lb 9.6 oz (82.8 kg)   BMI 26.97 kg/m  Wt Readings from Last 3 Encounters:  11/26/19 182 lb 9.6 oz (82.8 kg)  08/27/19 186 lb 12.8 oz (84.7 kg)  08/06/19 191 lb 3.2 oz (86.7 kg)    General: Appears his stated age, well developed, well nourished in NAD. ENT: Left upper eyelid swelling with crusting at the base of the  eyelash. No stye noted. Skin: Warm, dry and intact. No ulcerations noted. HEENT: Head: normal shape and size;  Cardiovascular: Normal rate and rhythm. S1,S2 noted.  No murmur, rubs or gallops noted. No JVD or BLE edema. Pulmonary/Chest: Normal effort and positive vesicular breath sounds. No respiratory distress. No wheezes, rales or ronchi noted.  Abdomen: Soft and nontender. Normal bowel sounds. No distention or masses noted.  Musculoskeletal: Gait slow and steady with use of cane. Neurological: Alert and oriented.  Psychiatric: Mood and affect normal. Behavior is  normal. Judgment and thought content normal.     BMET    Component Value Date/Time   NA 133 (A) 10/02/2018 0000   K 5.1 10/11/2017 1004   CL 97 10/11/2017 1004   CO2 22 10/11/2017 1004   GLUCOSE 106 (H) 10/11/2017 1004   GLUCOSE 100 (H) 02/20/2017 0407   BUN 24 10/11/2017 1004   CREATININE 2.20 (H) 04/28/2019 1428   CALCIUM 9.3 10/11/2017 1004   GFRNONAA 43 (L) 10/11/2017 1004   GFRAA 50 (L) 10/11/2017 1004    Lipid Panel  No results found for: CHOL, TRIG, HDL, CHOLHDL, VLDL, LDLCALC  CBC    Component Value Date/Time   WBC 6.6 04/17/2017 1031   WBC 5.2 02/20/2017 0407   RBC 3.60 (L) 04/17/2017 1031   RBC 3.78 (L) 02/20/2017 0407   HGB 11.0 (L) 04/17/2017 1031   HCT 33.9 (L) 04/17/2017 1031   PLT 293 04/17/2017 1031   MCV 94 04/17/2017 1031   MCH 30.6 04/17/2017 1031   MCH 32.3 02/20/2017 0407   MCHC 32.4 04/17/2017 1031   MCHC 34.9 02/20/2017 0407   RDW 14.1 04/17/2017 1031   LYMPHSABS 0.5 (L) 03/12/2016 2216   MONOABS 0.6 03/12/2016 2216   EOSABS 0.1 03/12/2016 2216   BASOSABS 0.1 03/12/2016 2216    Hgb A1C No results found for: HGBA1C        Assessment & Plan:   Blepharitis, Left Upper Eyelid:  Warm compresses and wash with baby shampoo 2 x daily Notify me if worse  Return precautions discussed Webb Silversmith, NP This visit occurred during the SARS-CoV-2 public health emergency.  Safety  protocols were in place, including screening questions prior to the visit, additional usage of staff PPE, and extensive cleaning of exam room while observing appropriate contact time as indicated for disinfecting solutions.

## 2019-11-26 NOTE — Assessment & Plan Note (Signed)
Continue amlodipine and lisinopril We will monitor

## 2019-11-26 NOTE — Assessment & Plan Note (Signed)
No action given age

## 2019-11-26 NOTE — Assessment & Plan Note (Signed)
Continue lisinopril for renal protection 

## 2019-11-26 NOTE — Assessment & Plan Note (Signed)
No intervention needed at this time.

## 2019-11-26 NOTE — Assessment & Plan Note (Signed)
Paced We will monitor

## 2019-11-26 NOTE — Assessment & Plan Note (Signed)
Continue aspirin 

## 2019-11-26 NOTE — Assessment & Plan Note (Signed)
Doing well on his current dose of sertraline, we not indicated

## 2019-11-26 NOTE — Assessment & Plan Note (Signed)
Continue Tylenol Encouraged daily weightbearing exercise

## 2019-11-26 NOTE — Assessment & Plan Note (Signed)
Continue levothyroxine We will monitor free T4 yearly

## 2020-02-16 DIAGNOSIS — I495 Sick sinus syndrome: Secondary | ICD-10-CM | POA: Diagnosis not present

## 2020-02-25 ENCOUNTER — Other Ambulatory Visit: Payer: Self-pay

## 2020-02-25 ENCOUNTER — Encounter: Payer: Self-pay | Admitting: Internal Medicine

## 2020-02-25 ENCOUNTER — Ambulatory Visit: Payer: Medicare Other | Admitting: Internal Medicine

## 2020-02-25 DIAGNOSIS — I10 Essential (primary) hypertension: Secondary | ICD-10-CM | POA: Diagnosis not present

## 2020-02-25 DIAGNOSIS — I714 Abdominal aortic aneurysm, without rupture, unspecified: Secondary | ICD-10-CM

## 2020-02-25 DIAGNOSIS — Z85528 Personal history of other malignant neoplasm of kidney: Secondary | ICD-10-CM | POA: Diagnosis not present

## 2020-02-25 DIAGNOSIS — I495 Sick sinus syndrome: Secondary | ICD-10-CM

## 2020-02-25 DIAGNOSIS — F39 Unspecified mood [affective] disorder: Secondary | ICD-10-CM

## 2020-02-25 DIAGNOSIS — M1711 Unilateral primary osteoarthritis, right knee: Secondary | ICD-10-CM

## 2020-02-25 DIAGNOSIS — N1832 Chronic kidney disease, stage 3b: Secondary | ICD-10-CM

## 2020-02-25 NOTE — Assessment & Plan Note (Signed)
Better now Will continue the sertraline but consider weaning off at my next visit

## 2020-02-25 NOTE — Assessment & Plan Note (Signed)
BP Readings from Last 3 Encounters:  02/25/20 140/70  11/26/19 125/72  08/27/19 128/73   Good control on lisinopril and amlodipine

## 2020-02-25 NOTE — Assessment & Plan Note (Signed)
Pacemaker function still fine On ASA

## 2020-02-25 NOTE — Progress Notes (Signed)
Subjective:    Patient ID: Frank Bartlett, male    DOB: 1925/06/09, 86 y.o.   MRN: [id number]  HPI Visit in assisted living apartment for follow up of chronic health conditions Reviewed status with Frank Pucker RN  He has gotten over the anxiety Feels the sertraline has helped  Had recent CT for follow up of renal cell carcinoma No evidence of recurrence Noted AAA which is stable and aortic atherosclerosis  No chest pain No SOB No dizziness or syncope Mild edema at times---gone in the morning Gets regular pacemaker checks  Last GFR 37 Is on lisinopril  Bowels better now on nightly colace  Maintains independence with ADLs Continent Does daily resistance exercise and walks  Current Outpatient Medications on File Prior to Visit  Medication Sig Dispense Refill  . acetaminophen (TYLENOL) 500 MG tablet Take 500 mg by mouth daily.    Marland Kitchen amLODipine (NORVASC) 10 MG tablet Take 1 tablet (10 mg total) by mouth daily. 90 tablet 3  . aspirin EC 81 MG tablet Take 81 mg by mouth daily.     . Cyanocobalamin (RA VITAMIN B-12 TR) 1000 MCG TBCR Take 1,000 mcg by mouth daily.     Marland Kitchen docusate sodium (COLACE) 100 MG capsule Take 100 mg by mouth daily.    Marland Kitchen levothyroxine (SYNTHROID, LEVOTHROID) 112 MCG tablet Take 112 mcg by mouth daily before breakfast.    . lisinopril (PRINIVIL,ZESTRIL) 20 MG tablet     . Multiple Vitamin (MULTI-VITAMINS) TABS Take by mouth.    . sertraline (ZOLOFT) 25 MG tablet Take 50 mg by mouth daily.     No current facility-administered medications on file prior to visit.    No Known Allergies  Past Medical History:  Diagnosis Date  . AAA (abdominal aortic aneurysm) ([place])   . Actinic keratosis   . Aortic atherosclerosis ([place])   . Atrial fibrillation ([place]) 03/2016   brief spell  . B12 deficiency   . Basal cell carcinoma 10/30/2016   L lat crown  . Bradycardia   . Bradycardia 02/2017   Pacer placed  . Chronic kidney disease (CKD), stage III (moderate) ([facility])     . Hyperlipidemia   . Hypertension   . Hypothyroidism   . Osteoarthritis   . Prostate cancer ([place]) 2001   Rad tx's + seed implants  . Renal cancer, left ([place]) 03/2016   Left Renal Nephrectomy  . Sick sinus syndrome [facility])    Pacemaker    Past Surgical History:  Procedure Laterality Date  . CATARACT EXTRACTION, BILATERAL    . COLONOSCOPY    . EXCISIONAL HEMORRHOIDECTOMY    . INSERTION PROSTATE RADIATION SEED    . LAPAROSCOPIC NEPHRECTOMY, HAND ASSISTED Left 03/08/2016   Procedure: HAND ASSISTED LAPAROSCOPIC NEPHRECTOMY;  Surgeon: Nickie Retort, MD;  Location: [facility] ORS;  Service: Urology;  Laterality: Left;  . LAPAROTOMY N/A 03/13/2016   Procedure: EXPLORATORY LAPAROTOMY;  Surgeon: Festus Aloe, MD;  Location: [facility] ORS;  Service: Urology;  Laterality: N/A;  . PACEMAKER INSERTION N/A 02/20/2017   Procedure: INSERTION PACEMAKER;  Surgeon: Isaias Cowman, MD;  Location: [facility] ORS;  Service: Cardiovascular;  Laterality: N/A;    Family History  Problem Relation Age of Onset  . Hypertension Mother   . Breast cancer Mother   . Colon cancer Father   . Prostate cancer Neg Hx   . Bladder Cancer Neg Hx   . Kidney cancer Neg Hx     Social History   Socioeconomic History  . Marital  status: Widowed    Spouse name: Not on file  . Number of children: Not on file  . Years of education: Not on file  . Highest education level: Not on file  Occupational History  . Occupation: Comptroller/accountant    Comment: Retired  Tobacco Use  . Smoking status: Never Smoker  . Smokeless tobacco: Never Used  Substance and Sexual Activity  . Alcohol use: No    Alcohol/week: 0.0 standard drinks    Comment: rare wine  . Drug use: No  . Sexual activity: Not Currently  Other Topics Concern  . Not on file  Social History Narrative   Widowed 1/18   3 children      Has living will   Son Frank Bartlett is [facility] POA   Has DNR   No tube feeds if cognitively unaware   Social Determinants of  Health   Financial Resource Strain:   . Difficulty of Paying Living Expenses: Not on file  Food Insecurity:   . Worried About Charity fundraiser in the Last Year: Not on file  . Ran Out of Food in the Last Year: Not on file  Transportation Needs:   . Lack of Transportation (Medical): Not on file  . Lack of Transportation (Non-Medical): Not on file  Physical Activity:   . Days of Exercise per Week: Not on file  . Minutes of Exercise per Session: Not on file  Stress:   . Feeling of Stress : Not on file  Social Connections:   . Frequency of Communication with Friends and Family: Not on file  . Frequency of Social Gatherings with Friends and Family: Not on file  . Attends Religious Services: Not on file  . Active Member of Clubs or Organizations: Not on file  . Attends Archivist Meetings: Not on file  . Marital Status: Not on file  Intimate Partner Violence:   . Fear of Current or Ex-Partner: Not on file  . Emotionally Abused: Not on file  . Physically Abused: Not on file  . 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 Urinary stream okay in the day No sig back pain. Some right knee pain---is on tylenol Chronic hemorrhoids---the cream helps and he can reduce without problems    Objective:   Physical Exam Constitutional:      Appearance: Normal appearance.  Cardiovascular:     Rate and Rhythm: Normal rate and regular rhythm.     Heart sounds: No murmur heard.  No gallop.      Comments: Rare skips Abdominal:     Palpations: Abdomen is soft.     Tenderness: There is no abdominal tenderness.     Comments: Easily reducible mid abdominal hernia  Musculoskeletal:     Cervical back: Neck supple.     Right lower leg: No edema.     Left lower leg: No edema.  Lymphadenopathy:     Cervical: No cervical adenopathy.  Neurological:     Mental Status: He  is alert.  Psychiatric:        Mood and Affect: Mood normal.        Behavior: Behavior normal.            Assessment & Plan:

## 2020-02-25 NOTE — Assessment & Plan Note (Signed)
Gets surveillance CT scans

## 2020-02-25 NOTE — Assessment & Plan Note (Signed)
Mostly right knee Will increase daily tylenol dose

## 2020-02-25 NOTE — Assessment & Plan Note (Signed)
Recent CT shows stability (3.6cm maximum diameter) No action needed

## 2020-02-25 NOTE — Assessment & Plan Note (Signed)
Is on lisinopril Will monitor yearly

## 2020-03-21 ENCOUNTER — Other Ambulatory Visit: Payer: Self-pay

## 2020-03-21 ENCOUNTER — Ambulatory Visit: Payer: Medicare Other | Admitting: Dermatology

## 2020-03-21 DIAGNOSIS — L578 Other skin changes due to chronic exposure to nonionizing radiation: Secondary | ICD-10-CM

## 2020-03-21 DIAGNOSIS — L820 Inflamed seborrheic keratosis: Secondary | ICD-10-CM | POA: Diagnosis not present

## 2020-03-21 DIAGNOSIS — L570 Actinic keratosis: Secondary | ICD-10-CM | POA: Diagnosis not present

## 2020-03-21 DIAGNOSIS — L821 Other seborrheic keratosis: Secondary | ICD-10-CM | POA: Diagnosis not present

## 2020-03-21 NOTE — Progress Notes (Signed)
   Follow-Up Visit   Subjective  Frank Bartlett is a 86 y.o. male who presents for the following: Actinic Keratosis (recheck for any new or persistent skin lesions on the face, ears, and scalp)  The following portions of the chart were reviewed this encounter and updated as appropriate:  Tobacco  Allergies  Meds  Problems  Med Hx  Surg Hx  Fam Hx     Review of Systems:  No other skin or systemic complaints except as noted in HPI or Assessment and Plan.  Objective  Well appearing patient in no apparent distress; mood and affect are within normal limits.  A focused examination was performed including the face and scalp. Relevant physical exam findings are noted in the Assessment and Plan.  Objective  Face, scalp and ears x (11): Erythematous thin papules/macules with gritty scale.   Objective  scalp x 3 (3): Erythematous keratotic or waxy stuck-on papule or plaque.   Assessment & Plan  AK (actinic keratosis) (11) Face, scalp and ears x  Destruction of lesion - Face, scalp and ears x Complexity: simple   Destruction method: cryotherapy   Informed consent: discussed and consent obtained   Timeout:  patient name, date of birth, surgical site, and procedure verified Lesion destroyed using liquid nitrogen: Yes   Region frozen until ice ball extended beyond lesion: Yes   Outcome: patient tolerated procedure well with no complications   Post-procedure details: wound care instructions given    Inflamed seborrheic keratosis (3) scalp x 3  Destruction of lesion - scalp x 3 Complexity: simple   Destruction method: cryotherapy   Informed consent: discussed and consent obtained   Timeout:  patient name, date of birth, surgical site, and procedure verified Lesion destroyed using liquid nitrogen: Yes   Region frozen until ice ball extended beyond lesion: Yes   Outcome: patient tolerated procedure well with no complications   Post-procedure details: wound care instructions  given    Actinic Damage - chronic, secondary to cumulative UV radiation exposure/sun exposure over time - diffuse scaly erythematous macules with underlying dyspigmentation - Recommend daily broad spectrum sunscreen SPF 30+ to sun-exposed areas, reapply every 2 hours as needed.  - Call for new or changing lesions.  Seborrheic Keratoses - Stuck-on, waxy, tan-brown papules and plaques  - Discussed benign etiology and prognosis. - Observe - Call for any changes  Return in about 4 months (around 07/19/2020).  Luther Redo, CMA, am acting as scribe for Sarina Ser, MD .  Documentation: I have reviewed the above documentation for accuracy and completeness, and I agree with the above.  Sarina Ser, MD

## 2020-03-22 ENCOUNTER — Encounter: Payer: Self-pay | Admitting: Dermatology

## 2020-03-24 DIAGNOSIS — C649 Malignant neoplasm of unspecified kidney, except renal pelvis: Secondary | ICD-10-CM | POA: Diagnosis not present

## 2020-03-24 DIAGNOSIS — E039 Hypothyroidism, unspecified: Secondary | ICD-10-CM | POA: Diagnosis not present

## 2020-03-28 ENCOUNTER — Other Ambulatory Visit: Payer: Self-pay

## 2020-04-23 ENCOUNTER — Emergency Department: Payer: Medicare Other

## 2020-04-23 ENCOUNTER — Encounter: Payer: Self-pay | Admitting: Emergency Medicine

## 2020-04-23 ENCOUNTER — Inpatient Hospital Stay
Admission: EM | Admit: 2020-04-23 | Discharge: 2020-04-28 | DRG: 389 | Disposition: A | Discharge status: Skilled Nursing Facility | Payer: Medicare Other | Source: Skilled Nursing Facility | Attending: Family Medicine | Admitting: Family Medicine

## 2020-04-23 DIAGNOSIS — F419 Anxiety disorder, unspecified: Secondary | ICD-10-CM | POA: Diagnosis present

## 2020-04-23 DIAGNOSIS — Z800 Family history of malignant neoplasm of digestive organs: Secondary | ICD-10-CM

## 2020-04-23 DIAGNOSIS — Z79890 Hormone replacement therapy (postmenopausal): Secondary | ICD-10-CM

## 2020-04-23 DIAGNOSIS — K5730 Diverticulosis of large intestine without perforation or abscess without bleeding: Secondary | ICD-10-CM | POA: Diagnosis not present

## 2020-04-23 DIAGNOSIS — H9190 Unspecified hearing loss, unspecified ear: Secondary | ICD-10-CM | POA: Diagnosis present

## 2020-04-23 DIAGNOSIS — K436 Other and unspecified ventral hernia with obstruction, without gangrene: Secondary | ICD-10-CM | POA: Diagnosis not present

## 2020-04-23 DIAGNOSIS — I480 Paroxysmal atrial fibrillation: Secondary | ICD-10-CM | POA: Diagnosis present

## 2020-04-23 DIAGNOSIS — Z923 Personal history of irradiation: Secondary | ICD-10-CM | POA: Diagnosis not present

## 2020-04-23 DIAGNOSIS — I10 Essential (primary) hypertension: Secondary | ICD-10-CM | POA: Diagnosis present

## 2020-04-23 DIAGNOSIS — Z79899 Other long term (current) drug therapy: Secondary | ICD-10-CM

## 2020-04-23 DIAGNOSIS — K649 Unspecified hemorrhoids: Secondary | ICD-10-CM | POA: Diagnosis present

## 2020-04-23 DIAGNOSIS — F32A Depression, unspecified: Secondary | ICD-10-CM | POA: Diagnosis not present

## 2020-04-23 DIAGNOSIS — K449 Diaphragmatic hernia without obstruction or gangrene: Secondary | ICD-10-CM | POA: Diagnosis present

## 2020-04-23 DIAGNOSIS — N1831 Chronic kidney disease, stage 3a: Secondary | ICD-10-CM | POA: Diagnosis not present

## 2020-04-23 DIAGNOSIS — R0902 Hypoxemia: Secondary | ICD-10-CM | POA: Diagnosis not present

## 2020-04-23 DIAGNOSIS — I700 Atherosclerosis of aorta: Secondary | ICD-10-CM | POA: Diagnosis present

## 2020-04-23 DIAGNOSIS — I4891 Unspecified atrial fibrillation: Secondary | ICD-10-CM | POA: Diagnosis not present

## 2020-04-23 DIAGNOSIS — E785 Hyperlipidemia, unspecified: Secondary | ICD-10-CM | POA: Diagnosis present

## 2020-04-23 DIAGNOSIS — Z85528 Personal history of other malignant neoplasm of kidney: Secondary | ICD-10-CM | POA: Diagnosis not present

## 2020-04-23 DIAGNOSIS — Z66 Do not resuscitate: Secondary | ICD-10-CM | POA: Diagnosis present

## 2020-04-23 DIAGNOSIS — I495 Sick sinus syndrome: Secondary | ICD-10-CM | POA: Diagnosis not present

## 2020-04-23 DIAGNOSIS — Z9842 Cataract extraction status, left eye: Secondary | ICD-10-CM

## 2020-04-23 DIAGNOSIS — Z978 Presence of other specified devices: Secondary | ICD-10-CM | POA: Diagnosis not present

## 2020-04-23 DIAGNOSIS — N179 Acute kidney failure, unspecified: Secondary | ICD-10-CM

## 2020-04-23 DIAGNOSIS — Z905 Acquired absence of kidney: Secondary | ICD-10-CM

## 2020-04-23 DIAGNOSIS — R1110 Vomiting, unspecified: Secondary | ICD-10-CM | POA: Diagnosis not present

## 2020-04-23 DIAGNOSIS — E039 Hypothyroidism, unspecified: Secondary | ICD-10-CM | POA: Diagnosis present

## 2020-04-23 DIAGNOSIS — I714 Abdominal aortic aneurysm, without rupture, unspecified: Secondary | ICD-10-CM | POA: Diagnosis present

## 2020-04-23 DIAGNOSIS — K567 Ileus, unspecified: Secondary | ICD-10-CM

## 2020-04-23 DIAGNOSIS — K56600 Partial intestinal obstruction, unspecified as to cause: Principal | ICD-10-CM | POA: Diagnosis present

## 2020-04-23 DIAGNOSIS — I499 Cardiac arrhythmia, unspecified: Secondary | ICD-10-CM | POA: Diagnosis not present

## 2020-04-23 DIAGNOSIS — Z7982 Long term (current) use of aspirin: Secondary | ICD-10-CM

## 2020-04-23 DIAGNOSIS — E538 Deficiency of other specified B group vitamins: Secondary | ICD-10-CM | POA: Diagnosis present

## 2020-04-23 DIAGNOSIS — J9811 Atelectasis: Secondary | ICD-10-CM | POA: Diagnosis not present

## 2020-04-23 DIAGNOSIS — R110 Nausea: Secondary | ICD-10-CM | POA: Diagnosis not present

## 2020-04-23 DIAGNOSIS — Z803 Family history of malignant neoplasm of breast: Secondary | ICD-10-CM

## 2020-04-23 DIAGNOSIS — Z20822 Contact with and (suspected) exposure to covid-19: Secondary | ICD-10-CM | POA: Diagnosis present

## 2020-04-23 DIAGNOSIS — R1084 Generalized abdominal pain: Secondary | ICD-10-CM | POA: Diagnosis not present

## 2020-04-23 DIAGNOSIS — R52 Pain, unspecified: Secondary | ICD-10-CM | POA: Diagnosis not present

## 2020-04-23 DIAGNOSIS — K56609 Unspecified intestinal obstruction, unspecified as to partial versus complete obstruction: Secondary | ICD-10-CM

## 2020-04-23 DIAGNOSIS — R1030 Lower abdominal pain, unspecified: Secondary | ICD-10-CM

## 2020-04-23 DIAGNOSIS — Z8546 Personal history of malignant neoplasm of prostate: Secondary | ICD-10-CM

## 2020-04-23 DIAGNOSIS — R1319 Other dysphagia: Secondary | ICD-10-CM | POA: Diagnosis not present

## 2020-04-23 DIAGNOSIS — Z9841 Cataract extraction status, right eye: Secondary | ICD-10-CM

## 2020-04-23 DIAGNOSIS — R109 Unspecified abdominal pain: Secondary | ICD-10-CM | POA: Diagnosis not present

## 2020-04-23 DIAGNOSIS — Z8249 Family history of ischemic heart disease and other diseases of the circulatory system: Secondary | ICD-10-CM

## 2020-04-23 DIAGNOSIS — Z950 Presence of cardiac pacemaker: Secondary | ICD-10-CM

## 2020-04-23 DIAGNOSIS — K439 Ventral hernia without obstruction or gangrene: Secondary | ICD-10-CM | POA: Diagnosis not present

## 2020-04-23 DIAGNOSIS — Z85828 Personal history of other malignant neoplasm of skin: Secondary | ICD-10-CM

## 2020-04-23 DIAGNOSIS — I129 Hypertensive chronic kidney disease with stage 1 through stage 4 chronic kidney disease, or unspecified chronic kidney disease: Secondary | ICD-10-CM | POA: Diagnosis not present

## 2020-04-23 DIAGNOSIS — K5669 Other partial intestinal obstruction: Secondary | ICD-10-CM | POA: Diagnosis not present

## 2020-04-23 DIAGNOSIS — K56699 Other intestinal obstruction unspecified as to partial versus complete obstruction: Secondary | ICD-10-CM | POA: Diagnosis not present

## 2020-04-23 LAB — CBC
HCT: 38.9 % — ABNORMAL LOW (ref 39.0–52.0)
Hemoglobin: 13 g/dL (ref 13.0–17.0)
MCH: 32.2 pg (ref 26.0–34.0)
MCHC: 33.4 g/dL (ref 30.0–36.0)
MCV: 96.3 fL (ref 80.0–100.0)
Platelets: 267 10*3/uL (ref 150–400)
RBC: 4.04 MIL/uL — ABNORMAL LOW (ref 4.22–5.81)
RDW: 12.9 % (ref 11.5–15.5)
WBC: 11.5 10*3/uL — ABNORMAL HIGH (ref 4.0–10.5)
nRBC: 0 % (ref 0.0–0.2)

## 2020-04-23 LAB — COMPREHENSIVE METABOLIC PANEL
ALT: 16 U/L (ref 0–44)
AST: 22 U/L (ref 15–41)
Albumin: 4.4 g/dL (ref 3.5–5.0)
Alkaline Phosphatase: 87 U/L (ref 38–126)
Anion gap: 13 (ref 5–15)
BUN: 38 mg/dL — ABNORMAL HIGH (ref 8–23)
CO2: 21 mmol/L — ABNORMAL LOW (ref 22–32)
Calcium: 9.6 mg/dL (ref 8.9–10.3)
Chloride: 98 mmol/L (ref 98–111)
Creatinine, Ser: 1.94 mg/dL — ABNORMAL HIGH (ref 0.61–1.24)
GFR, Estimated: 32 mL/min — ABNORMAL LOW (ref >60)
Glucose, Bld: 130 mg/dL — ABNORMAL HIGH (ref 70–99)
Potassium: 4.4 mmol/L (ref 3.5–5.1)
Sodium: 132 mmol/L — ABNORMAL LOW (ref 135–145)
Total Bilirubin: 0.7 mg/dL (ref 0.3–1.2)
Total Protein: 8 g/dL (ref 6.5–8.1)

## 2020-04-23 LAB — LIPASE, BLOOD: Lipase: 38 U/L (ref 11–51)

## 2020-04-23 MED ORDER — SODIUM CHLORIDE 0.9 % IV BOLUS
1000.0000 mL | Freq: Once | INTRAVENOUS | Status: AC
  Administered 2020-04-23: 1000 mL via INTRAVENOUS

## 2020-04-23 MED ORDER — IOHEXOL 350 MG/ML SOLN
75.0000 mL | Freq: Once | INTRAVENOUS | Status: AC | PRN
  Administered 2020-04-23: 75 mL via INTRAVENOUS

## 2020-04-23 MED ORDER — ONDANSETRON HCL 4 MG/2ML IJ SOLN
4.0000 mg | Freq: Once | INTRAMUSCULAR | Status: AC
  Administered 2020-04-23: 4 mg via INTRAVENOUS
  Filled 2020-04-23: qty 2

## 2020-04-23 NOTE — ED Triage Notes (Signed)
Pt arrived via EMS from [facility] where he has had lower abdominal pain, N/V since 1900. Pt has hernia at umbilicus that pt reports is more protruded than usual.

## 2020-04-23 NOTE — ED Provider Notes (Signed)
[facility] Emergency Department Provider Note   ____________________________________________   Event Date/Time   First MD Initiated Contact with Patient 04/23/20 2311     (approximate)  I have reviewed the triage vital signs and the nursing notes.   HISTORY  Chief Complaint Abdominal Pain    HPI Frank Bartlett is a 86 y.o. male brought to the ED via EMS from [facility] with a chief complaint of lower abdominal pain and nausea since 7 PM.  Patient has a known ventral hernia which is protruding more than usual.  Also with a known 3.6 cm AAA.  Does not take anticoagulants.  Denies associated fever, chills, cough, chest pain, shortness of breath, vomiting, dysuria or diarrhea.     Past Medical History:  Diagnosis Date   AAA (abdominal aortic aneurysm) (HCC)    Actinic keratosis    Aortic atherosclerosis (HCC)    Atrial fibrillation ([place]) 03/2016   brief spell   B12 deficiency    Basal cell carcinoma 10/30/2016   L lat crown   Bradycardia    Bradycardia 02/2017   Pacer placed   Chronic kidney disease (CKD), stage III (moderate) (HCC)    Hyperlipidemia    Hypertension    Hypothyroidism    Osteoarthritis    Prostate cancer ([place]) 2001   Rad tx's + seed implants   Renal cancer, left ([place]) 03/2016   Left Renal Nephrectomy   Sick sinus syndrome [facility])    Pacemaker    Patient Active Problem List   Diagnosis Date Noted   SBO (small bowel obstruction) ([place]) 04/24/2020   Mood disorder ([place]) 08/06/2019   HLD (hyperlipidemia) 04/24/2019   Sick sinus syndrome (HCC)    AAA (abdominal aortic aneurysm) (HCC)    Aortic atherosclerosis ([place])    Hypertension    Hypothyroidism    Osteoarthritis of right knee    CKD (chronic kidney disease) stage 3, GFR 30-59 ml/min ([place]) 06/01/2016   Paroxysmal atrial flutter ([place]) 05/16/2016   History of renal cell cancer 03/26/2016    Past Surgical History:  Procedure Laterality Date    CATARACT EXTRACTION, BILATERAL     COLONOSCOPY     EXCISIONAL HEMORRHOIDECTOMY     INSERTION PROSTATE RADIATION SEED     LAPAROSCOPIC NEPHRECTOMY, HAND ASSISTED Left 03/08/2016   Procedure: HAND ASSISTED LAPAROSCOPIC NEPHRECTOMY;  Surgeon: Nickie Retort, MD;  Location: [facility] ORS;  Service: Urology;  Laterality: Left;   LAPAROTOMY N/A 03/13/2016   Procedure: EXPLORATORY LAPAROTOMY;  Surgeon: Festus Aloe, MD;  Location: [facility] ORS;  Service: Urology;  Laterality: N/A;   PACEMAKER INSERTION N/A 02/20/2017   Procedure: INSERTION PACEMAKER;  Surgeon: Isaias Cowman, MD;  Location: [facility] ORS;  Service: Cardiovascular;  Laterality: N/A;    Prior to Admission medications   Medication Sig Start Date End Date Taking? Authorizing Provider  acetaminophen (TYLENOL) 500 MG tablet Take 1,000 mg by mouth daily. And 1000mg  twice a day prn   Yes [provider]  amLODipine (NORVASC) 10 MG tablet Take 1 tablet (10 mg total) by mouth daily. 11/22/18  Yes Jearld Fenton, NP  aspirin EC 81 MG tablet Take 81 mg by mouth daily.    Yes [provider]  Cyanocobalamin 1000 MCG TBCR Take 1,000 mcg by mouth daily.    Yes [provider]  docusate sodium (COLACE) 100 MG capsule Take 100 mg by mouth daily.   Yes [provider]  levothyroxine (SYNTHROID, LEVOTHROID) 112 MCG tablet Take 112 mcg by  mouth daily before breakfast.   Yes [provider]  lisinopril (PRINIVIL,ZESTRIL) 20 MG tablet Take 20 mg by mouth daily.   Yes [provider]  Multiple Vitamin (MULTI-VITAMINS) TABS Take 1 tablet by mouth daily.   Yes [provider]  PROCTO-MED HC 2.5 % rectal cream Apply 1 application topically as directed. 03/30/20  Yes [provider]  sertraline (ZOLOFT) 50 MG tablet Take 50 mg by mouth daily. 04/13/20  Yes [provider]    Allergies Patient has no known allergies.  Family History  Problem Relation Age of Onset    Hypertension Mother    Breast cancer Mother    Colon cancer Father    Prostate cancer Neg Hx    Bladder Cancer Neg Hx    Kidney cancer Neg Hx     Social History Social History   Tobacco Use   Smoking status: Never Smoker   Smokeless tobacco: Never Used  Substance Use Topics   Alcohol use: No    Alcohol/week: 0.0 standard drinks    Comment: rare wine   Drug use: No    Review of Systems  Constitutional: No fever/chills Eyes: No visual changes. ENT: No sore throat. Cardiovascular: Denies chest pain. Respiratory: Denies shortness of breath. Gastrointestinal: Positive for lower abdominal pain and nausea, no vomiting.  No diarrhea.  No constipation. Genitourinary: Negative for dysuria. Musculoskeletal: Negative for back pain. Skin: Negative for rash. Neurological: Negative for headaches, focal weakness or numbness.   ____________________________________________   PHYSICAL EXAM:  VITAL SIGNS: ED Triage Vitals [04/23/20 2257]  Enc Vitals Group     BP (!) 175/92     Pulse Rate 78     Resp 18     Temp 98.8 F (37.1 C)     Temp Source Oral     SpO2 98 %     Weight      Height      Head Circumference      Peak Flow      Pain Score      Pain Loc      Pain Edu?      Excl. in [place]?     Constitutional: Alert and oriented.  Elderly appearing and in no acute distress. Eyes: Conjunctivae are normal. PERRL. EOMI. Head: Atraumatic. Nose: No congestion/rhinnorhea. Mouth/Throat: Mucous membranes are mildly dry.   Neck: No stridor.   Cardiovascular: Normal rate, irregular rhythm. Grossly normal heart sounds.  Good peripheral circulation. Respiratory: Normal respiratory effort.  No retractions. Lungs CTAB. Gastrointestinal: Large ventral hernia on exam which is soft and easily reducible.  Mild tenderness to palpation suprapubic area without rebound or guarding. No distention. No abdominal bruits. No CVA tenderness. Musculoskeletal: No lower extremity tenderness nor  edema.  No joint effusions. Neurologic:  Normal speech and language. No gross focal neurologic deficits are appreciated.  Skin:  Skin is warm, dry and intact. No rash noted.  No petechiae. Psychiatric: Mood and affect are normal. Speech and behavior are normal.  ____________________________________________   LABS (all labs ordered are listed, but only abnormal results are displayed)  Labs Reviewed  COMPREHENSIVE METABOLIC PANEL - Abnormal; Notable for the following components:      Result Value   Sodium 132 (*)    CO2 21 (*)    Glucose, Bld 130 (*)    BUN 38 (*)    Creatinine, Ser 1.94 (*)    GFR, Estimated 32 (*)    All other components within normal limits  CBC -  Abnormal; Notable for the following components:   WBC 11.5 (*)    RBC 4.04 (*)    HCT 38.9 (*)    All other components within normal limits  URINALYSIS, COMPLETE (UACMP) WITH MICROSCOPIC - Abnormal; Notable for the following components:   Color, Urine YELLOW (*)    APPearance CLEAR (*)    Specific Gravity, Urine 1.040 (*)    All other components within normal limits  RESP PANEL BY RT-PCR (FLU A&B, COVID) ARPGX2  LIPASE, BLOOD  LACTIC ACID, PLASMA  LACTIC ACID, PLASMA  TROPONIN I (HIGH SENSITIVITY)   ____________________________________________  EKG  ED ECG REPORT I, Jackqueline Aquilar J, the attending physician, personally viewed and interpreted this ECG.   Date: 04/23/2020  EKG Time: 2257  Rate: 76  Rhythm: atrial fibrillation, rate 76  Axis: LAD  Intervals:left bundle branch block  ST&T Change: Nonspecific  ____________________________________________  RADIOLOGY I, Ashely Joshua J, personally viewed and evaluated these images (plain radiographs) as part of my medical decision making, as well as reviewing the written report by the radiologist.  ED MD interpretation: No acute cardiopulmonary process; partial SBO on CT scan  Official radiology report(s): DG Chest Port 1 View  Result Date: 04/23/2020 CLINICAL  DATA:  Lower abdominal pain. EXAM: PORTABLE CHEST 1 VIEW COMPARISON:  February 20, 2017 FINDINGS: There is a dual lead AICD. A trace amount of linear atelectasis is seen within the left lung base. There is no evidence of acute infiltrate, pleural effusion or pneumothorax. The heart size and mediastinal contours are within normal limits. There is moderate severity calcification of the aortic arch. Degenerative changes seen within the mid and lower thoracic spine. IMPRESSION: Stable exam without acute or active cardiopulmonary disease. Electronically Signed   By: Virgina Norfolk M.D.   On: 04/23/2020 23:42   CT Angio Abd/Pel W and/or Wo Contrast  Result Date: 04/24/2020 CLINICAL DATA:  Abdominal pain and vomiting. EXAM: CTA ABDOMEN AND PELVIS WITHOUT AND WITH CONTRAST TECHNIQUE: Multidetector CT imaging of the abdomen and pelvis was performed using the standard protocol during bolus administration of intravenous contrast. Multiplanar reconstructed images and MIPs were obtained and reviewed to evaluate the vascular anatomy. CONTRAST:  10mL OMNIPAQUE IOHEXOL 350 MG/ML SOLN COMPARISON:  April 28, 2019 and October 28, 2018 FINDINGS: VASCULAR Aorta: Marked severity calcification and atherosclerosis with 3.6 cm x 2.9 cm infrarenal of aneurysmal dilatation. There is no evidence of dissection, vasculitis or significant stenosis. Celiac: 9.5 mm dilatation just beyond its origin, without evidence of dissection, vasculitis or significant stenosis. SMA: Patent without evidence of aneurysm, dissection, vasculitis or significant stenosis. Renals: The right renal artery is patent without evidence of aneurysm, dissection, vasculitis, fibromuscular dysplasia or significant stenosis. IMA: Patent without evidence of aneurysm, dissection, vasculitis or significant stenosis. Inflow: Marked severity calcification and atherosclerosis without evidence of aneurysmal dilatation, dissection, vasculitis or significant stenosis. Proximal  Outflow: Marked severity calcification and atherosclerosis is seen involving the bilateral common femoral and visualized portions of the superficial and profunda femoral arteries, without evidence of aneurysm, dissection, vasculitis or significant stenosis. Veins: No obvious venous abnormality within the limitations of this arterial phase study. Review of the MIP images confirms the above findings. NON-VASCULAR Lower chest: No acute abnormality. Hepatobiliary: There is diffuse fatty infiltration of the liver parenchyma. Numerous punctate calcified granulomas are seen scattered throughout the liver. A stable 1.1 cm cystic appearing areas seen within the right lobe. No gallstones, gallbladder wall thickening, or biliary dilatation. Pancreas: Punctate calcifications are seen along the junction of the  pancreatic body and head. A stable 1.7 cm x 0.8 cm cystic appearing area is seen within the tail of the pancreas (axial CT image 54, CT series number 4). Spleen: Numerous punctate calcified granulomas are seen scattered throughout the spleen. Adrenals/Urinary Tract: Adrenal glands are unremarkable. The left kidney is absent. The right kidney is normal in size, without renal calculi or hydronephrosis. 3.1 cm x 1.7 cm and 1.5 cm diameter cysts are seen within the lower pole of the right kidney. Bladder is unremarkable. Stomach/Bowel: There is a small hiatal hernia. Appendix appears normal. Multiple dilated small bowel loops are seen throughout the abdomen (maximum small bowel diameter of approximately 3.0 cm). While these small bowel loops extend through a ventral hernia, a transition zone is seen along the anterior aspect of the left lower quadrant (axial CT images 127 through 134, CT series number 4). Noninflamed diverticula are seen throughout the large bowel. Lymphatic: No abnormal abdominal or pelvic lymph nodes are identified. Reproductive: Numerous prostate radiation implantation seeds are seen. Other: A stable 7.2 cm  x 3.4 cm ventral hernia is noted along the midline of the upper abdomen. This contains a segment of nondilated transverse colon. A stable 6.0 cm x 2.7 cm ventral hernia, containing a short segment of dilated small bowel, is noted just above the umbilicus. Musculoskeletal: Multilevel degenerative changes seen throughout the lumbar spine. IMPRESSION: 1. Small bowel obstruction with a transition zone seen within the left lower quadrant. 2. Marked severity diffuse calcification and atherosclerosis with stable aneurysmal dilatation of the infrarenal abdominal aorta. 3. Stable ventral hernias, as described above. 4. Absent left kidney. 5. Colonic diverticulosis. 6. Small hiatal hernia. 7. Stable cystic appearing area within the tail of the pancreas. Correlation with surveillance MRI is recommended to confirm stability and exclude the presence of an underlying neoplastic process. Aortic Atherosclerosis (ICD10-I70.0). Electronically Signed   By: Virgina Norfolk M.D.   On: 04/24/2020 00:23    ____________________________________________   PROCEDURES  Procedure(s) performed (including Critical Care):  .1-3 Lead EKG Interpretation Performed by: Paulette Blanch, MD Authorized by: Paulette Blanch, MD     Interpretation: abnormal     ECG rate:  76   ECG rate assessment: normal     Rhythm: atrial fibrillation     Ectopy: none     Conduction: normal   Comments:     Patient placed on cardiac monitor to evaluate for arrhythmias     ____________________________________________   INITIAL IMPRESSION / ASSESSMENT AND PLAN / ED COURSE  As part of my medical decision making, I reviewed the following data within the [place] notes reviewed and incorporated, Labs reviewed, EKG interpreted, Old chart reviewed, Radiograph reviewed and Notes from prior ED visits     86 year old male presenting with lower abdominal pain and nausea Differential diagnosis includes, but is not limited to,  acute appendicitis, renal colic, testicular torsion, urinary tract infection/pyelonephritis, prostatitis,  epididymitis, diverticulitis, small bowel obstruction or ileus, colitis, abdominal aortic aneurysm, gastroenteritis, hernia, etc.  We will obtain lab work, UA, chest x-ray.  Obtain CTA abdomen/pelvis which should evaluate both AAA as well as incarcerated hernia.  Initiate IV fluid resuscitation, IV Zofran for nausea and reassess.   Clinical Course as of 04/24/20 0139  Sun Apr 24, 2020  [id number] Discussed with radiologist patient's CT scan results demonstrating partial SBO, 2 stable hernias which are likely not the cause of patient's partial SBO with transition point in the left pelvis.  Have paged general  surgery for management recommendations.  Will discuss with hospitalist services for admission. [JS]  0045 Discussed case with Dr. Dahlia Byes who agrees with medical management.  Recommends NG tube placement if patient starts to vomit. [JS]    Clinical Course User Index [JS] Paulette Blanch, MD     ____________________________________________   FINAL CLINICAL IMPRESSION(S) / ED DIAGNOSES  Final diagnoses:  Lower abdominal pain  Nausea  SBO (small bowel obstruction) ([place])  AKI (acute kidney injury) [facility])     ED Discharge Orders    None      *Please note:  Frank Bartlett was evaluated in Emergency Department on 04/24/2020 for the symptoms described in the history of present illness. He was evaluated in the context of the global COVID-19 pandemic, which necessitated consideration that the patient might be at risk for infection with the SARS-CoV-2 virus that causes COVID-19. Institutional protocols and algorithms that pertain to the evaluation of patients at risk for COVID-19 are in a state of rapid change based on information released by regulatory bodies including the CDC and federal and state organizations. These policies and algorithms were followed during the patient's care in the ED.   Some ED evaluations and interventions may be delayed as a result of limited staffing during and the pandemic.*   Note:  This document was prepared using Dragon voice recognition software and may include unintentional dictation errors.   Paulette Blanch, MD 04/24/20 [phone]

## 2020-04-23 NOTE — ED Triage Notes (Signed)
First nurse note: Per EMS report, patient lives at [facility] and has been having abdominal pain and vomiting for a few hours. EMT noted patient had dry heaves producing a yellow sputum. Patient had a kidney removed in 2017 due to cancer and has an abdominal hernia which is where the pain is located.

## 2020-04-24 ENCOUNTER — Inpatient Hospital Stay: Payer: Medicare Other

## 2020-04-24 ENCOUNTER — Other Ambulatory Visit: Payer: Self-pay

## 2020-04-24 DIAGNOSIS — I129 Hypertensive chronic kidney disease with stage 1 through stage 4 chronic kidney disease, or unspecified chronic kidney disease: Secondary | ICD-10-CM | POA: Diagnosis present

## 2020-04-24 DIAGNOSIS — I495 Sick sinus syndrome: Secondary | ICD-10-CM | POA: Diagnosis present

## 2020-04-24 DIAGNOSIS — F419 Anxiety disorder, unspecified: Secondary | ICD-10-CM | POA: Diagnosis present

## 2020-04-24 DIAGNOSIS — K5669 Other partial intestinal obstruction: Secondary | ICD-10-CM | POA: Diagnosis not present

## 2020-04-24 DIAGNOSIS — Z20822 Contact with and (suspected) exposure to covid-19: Secondary | ICD-10-CM | POA: Diagnosis present

## 2020-04-24 DIAGNOSIS — Z923 Personal history of irradiation: Secondary | ICD-10-CM | POA: Diagnosis not present

## 2020-04-24 DIAGNOSIS — K567 Ileus, unspecified: Secondary | ICD-10-CM | POA: Diagnosis not present

## 2020-04-24 DIAGNOSIS — N1831 Chronic kidney disease, stage 3a: Secondary | ICD-10-CM | POA: Diagnosis present

## 2020-04-24 DIAGNOSIS — H9190 Unspecified hearing loss, unspecified ear: Secondary | ICD-10-CM | POA: Diagnosis present

## 2020-04-24 DIAGNOSIS — I480 Paroxysmal atrial fibrillation: Secondary | ICD-10-CM | POA: Diagnosis present

## 2020-04-24 DIAGNOSIS — R1319 Other dysphagia: Secondary | ICD-10-CM | POA: Diagnosis not present

## 2020-04-24 DIAGNOSIS — Z905 Acquired absence of kidney: Secondary | ICD-10-CM | POA: Diagnosis not present

## 2020-04-24 DIAGNOSIS — K56609 Unspecified intestinal obstruction, unspecified as to partial versus complete obstruction: Secondary | ICD-10-CM | POA: Diagnosis present

## 2020-04-24 DIAGNOSIS — J9811 Atelectasis: Secondary | ICD-10-CM | POA: Diagnosis not present

## 2020-04-24 DIAGNOSIS — K439 Ventral hernia without obstruction or gangrene: Secondary | ICD-10-CM | POA: Diagnosis present

## 2020-04-24 DIAGNOSIS — I700 Atherosclerosis of aorta: Secondary | ICD-10-CM | POA: Diagnosis present

## 2020-04-24 DIAGNOSIS — I2510 Atherosclerotic heart disease of native coronary artery without angina pectoris: Secondary | ICD-10-CM | POA: Diagnosis not present

## 2020-04-24 DIAGNOSIS — K649 Unspecified hemorrhoids: Secondary | ICD-10-CM | POA: Diagnosis present

## 2020-04-24 DIAGNOSIS — E785 Hyperlipidemia, unspecified: Secondary | ICD-10-CM | POA: Diagnosis present

## 2020-04-24 DIAGNOSIS — K436 Other and unspecified ventral hernia with obstruction, without gangrene: Secondary | ICD-10-CM | POA: Diagnosis not present

## 2020-04-24 DIAGNOSIS — Z66 Do not resuscitate: Secondary | ICD-10-CM | POA: Diagnosis present

## 2020-04-24 DIAGNOSIS — E039 Hypothyroidism, unspecified: Secondary | ICD-10-CM | POA: Diagnosis present

## 2020-04-24 DIAGNOSIS — F32A Depression, unspecified: Secondary | ICD-10-CM | POA: Diagnosis present

## 2020-04-24 DIAGNOSIS — K5730 Diverticulosis of large intestine without perforation or abscess without bleeding: Secondary | ICD-10-CM | POA: Diagnosis present

## 2020-04-24 DIAGNOSIS — I714 Abdominal aortic aneurysm, without rupture: Secondary | ICD-10-CM | POA: Diagnosis present

## 2020-04-24 DIAGNOSIS — N179 Acute kidney failure, unspecified: Secondary | ICD-10-CM | POA: Diagnosis present

## 2020-04-24 DIAGNOSIS — Z978 Presence of other specified devices: Secondary | ICD-10-CM | POA: Diagnosis not present

## 2020-04-24 DIAGNOSIS — K56699 Other intestinal obstruction unspecified as to partial versus complete obstruction: Secondary | ICD-10-CM | POA: Diagnosis not present

## 2020-04-24 DIAGNOSIS — K56600 Partial intestinal obstruction, unspecified as to cause: Secondary | ICD-10-CM | POA: Diagnosis present

## 2020-04-24 DIAGNOSIS — Z85528 Personal history of other malignant neoplasm of kidney: Secondary | ICD-10-CM | POA: Diagnosis not present

## 2020-04-24 DIAGNOSIS — Z4682 Encounter for fitting and adjustment of non-vascular catheter: Secondary | ICD-10-CM | POA: Diagnosis not present

## 2020-04-24 DIAGNOSIS — Z8546 Personal history of malignant neoplasm of prostate: Secondary | ICD-10-CM | POA: Diagnosis not present

## 2020-04-24 DIAGNOSIS — K449 Diaphragmatic hernia without obstruction or gangrene: Secondary | ICD-10-CM | POA: Diagnosis present

## 2020-04-24 DIAGNOSIS — M47816 Spondylosis without myelopathy or radiculopathy, lumbar region: Secondary | ICD-10-CM | POA: Diagnosis not present

## 2020-04-24 LAB — URINALYSIS, COMPLETE (UACMP) WITH MICROSCOPIC
Bacteria, UA: NONE SEEN
Bilirubin Urine: NEGATIVE
Glucose, UA: NEGATIVE mg/dL
Hgb urine dipstick: NEGATIVE
Ketones, ur: NEGATIVE mg/dL
Leukocytes,Ua: NEGATIVE
Nitrite: NEGATIVE
Protein, ur: NEGATIVE mg/dL
Specific Gravity, Urine: 1.04 — ABNORMAL HIGH (ref 1.005–1.030)
Squamous Epithelial / HPF: NONE SEEN (ref 0–5)
pH: 5 (ref 5.0–8.0)

## 2020-04-24 LAB — RESP PANEL BY RT-PCR (FLU A&B, COVID) ARPGX2
Influenza A by PCR: NEGATIVE
Influenza B by PCR: NEGATIVE
SARS Coronavirus 2 by RT PCR: NEGATIVE

## 2020-04-24 LAB — LACTIC ACID, PLASMA
Lactic Acid, Venous: 1.1 mmol/L (ref 0.5–1.9)
Lactic Acid, Venous: 1.1 mmol/L (ref 0.5–1.9)

## 2020-04-24 LAB — TROPONIN I (HIGH SENSITIVITY): Troponin I (High Sensitivity): 12 ng/L (ref <18)

## 2020-04-24 LAB — CBC
HCT: 34.3 % — ABNORMAL LOW (ref 39.0–52.0)
Hemoglobin: 11.7 g/dL — ABNORMAL LOW (ref 13.0–17.0)
MCH: 32.9 pg (ref 26.0–34.0)
MCHC: 34.1 g/dL (ref 30.0–36.0)
MCV: 96.3 fL (ref 80.0–100.0)
Platelets: 225 10*3/uL (ref 150–400)
RBC: 3.56 MIL/uL — ABNORMAL LOW (ref 4.22–5.81)
RDW: 13 % (ref 11.5–15.5)
WBC: 6.9 10*3/uL (ref 4.0–10.5)
nRBC: 0 % (ref 0.0–0.2)

## 2020-04-24 LAB — BASIC METABOLIC PANEL
Anion gap: 9 (ref 5–15)
BUN: 33 mg/dL — ABNORMAL HIGH (ref 8–23)
CO2: 22 mmol/L (ref 22–32)
Calcium: 8.4 mg/dL — ABNORMAL LOW (ref 8.9–10.3)
Chloride: 103 mmol/L (ref 98–111)
Creatinine, Ser: 1.52 mg/dL — ABNORMAL HIGH (ref 0.61–1.24)
GFR, Estimated: 43 mL/min — ABNORMAL LOW (ref >60)
Glucose, Bld: 120 mg/dL — ABNORMAL HIGH (ref 70–99)
Potassium: 4.7 mmol/L (ref 3.5–5.1)
Sodium: 134 mmol/L — ABNORMAL LOW (ref 135–145)

## 2020-04-24 MED ORDER — ONDANSETRON HCL 4 MG/2ML IJ SOLN: 4.0000 mg | Freq: Four times a day (QID) | INTRAMUSCULAR | Status: DC | PRN

## 2020-04-24 MED ORDER — LEVOTHYROXINE SODIUM 112 MCG PO TABS
112.0000 ug | ORAL_TABLET | Freq: Every day | ORAL | Status: DC
  Filled 2020-04-24: qty 1

## 2020-04-24 MED ORDER — SERTRALINE HCL 50 MG PO TABS
50.0000 mg | ORAL_TABLET | Freq: Every day | ORAL | Status: DC
  Administered 2020-04-25 – 2020-04-28 (×4): 50 mg
  Filled 2020-04-24 (×4): qty 1

## 2020-04-24 MED ORDER — ACETAMINOPHEN 325 MG PO TABS: 325.0000 mg | ORAL_TABLET | Freq: Four times a day (QID) | ORAL | Status: DC | PRN

## 2020-04-24 MED ORDER — ONDANSETRON HCL 4 MG PO TABS: 4.0000 mg | ORAL_TABLET | Freq: Four times a day (QID) | ORAL | Status: DC | PRN

## 2020-04-24 MED ORDER — ACETAMINOPHEN 650 MG RE SUPP: 325.0000 mg | Freq: Four times a day (QID) | RECTAL | Status: DC | PRN

## 2020-04-24 MED ORDER — SODIUM CHLORIDE 0.9 % IV SOLN: INTRAVENOUS | Status: DC

## 2020-04-24 MED ORDER — AMLODIPINE BESYLATE 10 MG PO TABS: 10.0000 mg | ORAL_TABLET | Freq: Every day | ORAL | Status: DC

## 2020-04-24 MED ORDER — PHENOL 1.4 % MT LIQD
1.0000 | OROMUCOSAL | Status: DC | PRN
  Administered 2020-04-25: 05:00:00 1 via OROMUCOSAL
  Filled 2020-04-24: qty 177

## 2020-04-24 MED ORDER — ENOXAPARIN SODIUM 40 MG/0.4ML ~~LOC~~ SOLN
40.0000 mg | SUBCUTANEOUS | Status: DC
  Administered 2020-04-25 – 2020-04-28 (×4): 40 mg via SUBCUTANEOUS
  Filled 2020-04-24 (×4): qty 0.4

## 2020-04-24 MED ORDER — AMLODIPINE BESYLATE 10 MG PO TABS
10.0000 mg | ORAL_TABLET | Freq: Every day | ORAL | Status: DC
  Administered 2020-04-25 – 2020-04-28 (×4): 10 mg
  Filled 2020-04-24 (×4): qty 1

## 2020-04-24 MED ORDER — SERTRALINE HCL 50 MG PO TABS: 50.0000 mg | ORAL_TABLET | Freq: Every day | ORAL | Status: DC

## 2020-04-24 MED ORDER — LEVOTHYROXINE SODIUM 112 MCG PO TABS
112.0000 ug | ORAL_TABLET | Freq: Every day | ORAL | Status: DC
  Administered 2020-04-25 – 2020-04-28 (×4): 112 ug
  Filled 2020-04-24 (×4): qty 1

## 2020-04-24 MED ORDER — LISINOPRIL 20 MG PO TABS
20.0000 mg | ORAL_TABLET | Freq: Every day | ORAL | Status: DC
  Administered 2020-04-25 – 2020-04-28 (×4): 20 mg
  Filled 2020-04-24 (×4): qty 1

## 2020-04-24 MED ORDER — ORAL CARE MOUTH RINSE
15.0000 mL | Freq: Two times a day (BID) | OROMUCOSAL | Status: DC
  Administered 2020-04-25: 11:00:00 15 mL via OROMUCOSAL

## 2020-04-24 MED ORDER — LISINOPRIL 20 MG PO TABS: 20.0000 mg | ORAL_TABLET | Freq: Every day | ORAL | Status: DC

## 2020-04-24 NOTE — ED Notes (Signed)
300 ml of urine emptied from urinal

## 2020-04-24 NOTE — ED Notes (Signed)
Pt assisted with urinal

## 2020-04-24 NOTE — ED Notes (Signed)
500 ml of yellow straw colored urine emptied from urinal

## 2020-04-24 NOTE — ED Notes (Signed)
Pt resting comfortably at this time. NAD noted. Call bell in reach.  

## 2020-04-24 NOTE — ED Notes (Addendum)
Pt resting comfortably at this time. Height and weight obtained and documented. Pt denies any needs at this time. NAD noted. Pt denies pain or N/V. Pt denies any further heeds at this time. Call bell in reach. Pt is A&Ox4.

## 2020-04-24 NOTE — Progress Notes (Signed)
Patient ID: Frank Bartlett, male   DOB: 1925/11/24, 86 y.o.   MRN: [id number]  No charge  We did early hours of the morning with abdominal pain. Found to have small bowel obstruction on imaging study. Seen by surgery Dr. Adora Fridge. Recommends conservative management. NG tube placed. Patient denies any chest pain or shortness of breath. No family in the emergency room. Will continue IV fluids follow surgery recommendation.  D/w pt

## 2020-04-24 NOTE — Consult Note (Signed)
Patient ID: Frank Bartlett, male   DOB: May 14, 1925, 86 y.o.   MRN: [id number]  HPI Frank Bartlett is a 86 y.o. male seen in consultation for small bowel obstruction.  Case discussed with Dr. Posey Pronto in detail.  Presented to the emergency room last night complaining of abdominal pain.  His abdominal pain was moderate in nature periumbilical area and colicky type.  Pain was intermittent without any specific alleviating or aggravating factors.  He did have nausea and vomiting. Does have significant comorbidities including a history of atrial fibrillation, chronic kidney disease he did have a prior nephrectomy in the left side complicated by takeback for free air and it ended up being an negative laparotomy.  This was performed in 2017. He also reports over the last few years he has been having intermittent constipation and diarrhea.  He does also report decreased in flatus for the last 48 hours.  He did have a CT scan of the abdomen and pelvis that I personally reviewed showing evidence of a large ventral hernia with dilated loops of small bowel.  No evidence of free air no evidence of pneumatosis or internal hernia.  He did have a creatinine of 2.2 Normal white count hemoglobin of 11.7.  Rest of the LFTs were normal. He is 86 but he is very functional.  He does leave in a nursing home and uses a cane and a walker.  He is able to pay his bills and is fairly independent considering his age.  He does have significant hearing impairment, he is competent   HPI  Past Medical History:  Diagnosis Date  . AAA (abdominal aortic aneurysm) ([place])   . Actinic keratosis   . Aortic atherosclerosis ([place])   . Atrial fibrillation ([place]) 03/2016   brief spell  . B12 deficiency   . Basal cell carcinoma 10/30/2016   L lat crown  . Bradycardia   . Bradycardia 02/2017   Pacer placed  . Chronic kidney disease (CKD), stage III (moderate) (HCC)   . Hyperlipidemia   . Hypertension   . Hypothyroidism   .  Osteoarthritis   . Prostate cancer ([place]) 2001   Rad tx's + seed implants  . Renal cancer, left ([place]) 03/2016   Left Renal Nephrectomy  . Sick sinus syndrome [facility])    Pacemaker    Past Surgical History:  Procedure Laterality Date  . CATARACT EXTRACTION, BILATERAL    . COLONOSCOPY    . EXCISIONAL HEMORRHOIDECTOMY    . INSERTION PROSTATE RADIATION SEED    . LAPAROSCOPIC NEPHRECTOMY, HAND ASSISTED Left 03/08/2016   Procedure: HAND ASSISTED LAPAROSCOPIC NEPHRECTOMY;  Surgeon: Nickie Retort, MD;  Location: [facility] ORS;  Service: Urology;  Laterality: Left;  . LAPAROTOMY N/A 03/13/2016   Procedure: EXPLORATORY LAPAROTOMY;  Surgeon: Festus Aloe, MD;  Location: [facility] ORS;  Service: Urology;  Laterality: N/A;  . PACEMAKER INSERTION N/A 02/20/2017   Procedure: INSERTION PACEMAKER;  Surgeon: Isaias Cowman, MD;  Location: [facility] ORS;  Service: Cardiovascular;  Laterality: N/A;    Family History  Problem Relation Age of Onset  . Hypertension Mother   . Breast cancer Mother   . Colon cancer Father   . Prostate cancer Neg Hx   . Bladder Cancer Neg Hx   . Kidney cancer Neg Hx     Social History Social History   Tobacco Use  . Smoking status: Never Smoker  . Smokeless tobacco: Never Used  Substance Use Topics  . Alcohol use: No    Alcohol/week:  0.0 standard drinks    Comment: rare wine  . Drug use: No    No Known Allergies  Current Facility-Administered Medications  Medication Dose Route Frequency Provider Last Rate Last Admin  . 0.9 %  sodium chloride infusion   Intravenous Continuous Cox, Amy N, DO 125 mL/hr at 04/24/20 0244 New Bag at 04/24/20 0244  . acetaminophen (TYLENOL) tablet 325 mg  325 mg Oral Q6H PRN Cox, Amy N, DO       Or  . acetaminophen (TYLENOL) suppository 325 mg  325 mg Rectal Q6H PRN Cox, Amy N, DO      . amLODipine (NORVASC) tablet 10 mg  10 mg Oral Daily Cox, Amy N, DO      . enoxaparin (LOVENOX) injection 40 mg  40 mg Subcutaneous Q24H Cox, Amy N,  DO      . [START ON 04/25/2020] levothyroxine (SYNTHROID) tablet 112 mcg  112 mcg Oral QAC breakfast Cox, Amy N, DO      . lisinopril (ZESTRIL) tablet 20 mg  20 mg Oral Daily Cox, Amy N, DO      . ondansetron (ZOFRAN) tablet 4 mg  4 mg Oral Q6H PRN Cox, Amy N, DO       Or  . ondansetron (ZOFRAN) injection 4 mg  4 mg Intravenous Q6H PRN Cox, Amy N, DO      . sertraline (ZOLOFT) tablet 50 mg  50 mg Oral Daily Cox, Amy N, DO         Review of Systems Full ROS  was asked and was negative except for the information on the HPI  Physical Exam Blood pressure (!) 146/71, pulse 67, temperature 98.2 F (36.8 C), resp. rate 17, height 5\' 9"  (1.753 m), weight 83.5 kg, SpO2 95 %. CONSTITUTIONAL: Elderly male in no acute distress, he is hard of hearing EYES: Pupils are equal, round, , Sclera are non-icteric. EARS, NOSE, MOUTH AND THROAT: He is wearing a mask. Hearing is intact to voice. LYMPH NODES:  Lymph nodes in the neck are normal. RESPIRATORY:  Lungs are clear. There is normal respiratory effort, with equal breath sounds bilaterally, and without pathologic use of accessory muscles. CARDIOVASCULAR: Heart is regular without murmurs, gallops, or rubs. GI: The abdomen is  soft, nontender, and mildly distended.  There is decreased bowel sounds.  There is a reducible large ventral hernia midline from prior laparotomy incision There are no palpable masses. There is no hepatosplenomegaly.  MUSCULOSKELETAL: Normal muscle strength and tone. No cyanosis or edema.   SKIN: Turgor is good and there are no pathologic skin lesions or ulcers. NEUROLOGIC: Motor and sensation is grossly normal. Cranial nerves are grossly intact. PSYCH:  Oriented to person, place and time. Affect is normal.  Data Reviewed  I have personally reviewed the patient's imaging, laboratory findings and medical records.    Assessment/Plan 86 year old male with signs and systems consistent with partial small bowel obstruction.  He does  have a ventral hernia that is reducible and is not producing his obstruction.  I do see significant gastric dilation I think that he will benefit from NG tube placement.  I have placed the NG tube.  We will continue serial abdominal exams and x-rays.  The abdomen is otherwise benign.  There is no need for emergent surgical intervention at this time.  We will continue to follow  Caroleen Hamman, MD FACS General Surgeon 04/24/2020, 3:19 PM

## 2020-04-24 NOTE — H&P (Signed)
History and Physical   PEACE JOST VWU:[id number] DOB: Apr 13, 1926 DOA: 04/23/2020  PCP: Venia Carbon, MD  Outpatient Specialists: [facility], Dr. Saralyn Pilar Patient coming from: [place]  I have personally briefly reviewed patient's old medical records in [place].  Chief Concern: Abdominal pain  HPI: Frank Bartlett is a 86 y.o. male with medical history significant for hypothyroid, hypertension, depression, ventral hernia, history of renal cell carcinoma status post left renal nephrectomy, history of AAA, history of sinoatrial node dysfunction presented to the emergency department from [facility] via EMS for chief concerns of abdominal pain.  He reports he had a normal dinner at approximately 5 PM on 04/23/2020.  He achy abdominal pain that started approximately 6:30/7 PM on 04/23/2020.  The location of the pain is infraumbilical, described as built-up achy sensation, at its peak the pain was 6-7 out of 10 and currently he is not in any pain.  He denies radiation.  He reports the pain comes and goes and when the pain is present lasts approximately 3 to 5 minutes.  He endorses associated nausea however no vomiting.  He endorses buildup sensation of pain.  He reports minimal movements improves with pain sensation.  Constitutional: No Weight Change, No Fever ENT/Mouth: No sore throat, No Rhinorrhea Eyes: No Eye Pain, No Vision Changes Cardiovascular: No Chest Pain, no SOB,  No Edema, No Palpitations Respiratory: No Cough, No Sputum Gastrointestinal: + Nausea, No Vomiting, No Diarrhea, he endorses mild small amount of blood in stool that is infrequent. He reports that he has hemorrhoids. Genitourinary: no Urinary Incontinence, no dysuria, hematuria Musculoskeletal: No Arthralgias, No Myalgias Skin: No Skin Lesions, No Pruritus, Neuro: + Weakness, no Numbness, No Loss of Consciousness, No Syncope Psych: No Anxiety/Panic, No Depression, no  decrease appetite Heme/Lymph: No Bruising, No Bleeding  ED Course: Discussed with ED provider, patient requiring admission secondary to small bowel obstruction.  Vital signs stable afebrile on room air.  Labs in the ED was remarkable for sodium of 132, bicarb 21, glucose 130, BUN 38, creatinine 1.94, GFR 32, high sensitive troponin XII, lactic acid was not elevated at 1.1, WBC 11.5, hemoglobin 13, hematocrit 38.9, platelets 267.  UA collected with negative for nitrites and leukocytes.  CT of the abdomen and pelvis with contrast was read as small bowel obstruction with transition zone seen within the left lower quadrant.  Marked severity diffuse calcification and atherosclerosis with stable aneurysmal dilatation of the infrarenal abdominal aorta.  Stable ventral hernias.  Absent left kidney.  Colonic diverticulosis.  Stable cystic appearing area within the tail of the pancreas.  Small hiatal hernia.  ED provider spoke with general surgery and Dr. Dahlia Byes recommends medical management, NPO, IVF and hold on NG tube at this time as patient is not actively nauseous or vomiting.   Review of Systems: As per HPI otherwise 10 point review of systems negative.   Assessment/Plan  Principal Problem:   SBO (small bowel obstruction) (HCC) Active Problems:   History of renal cell cancer   CKD (chronic kidney disease) stage 3, GFR 30-59 ml/min (HCC)   Hypertension   Hypothyroidism   AAA (abdominal aortic aneurysm) (HCC)   Aortic atherosclerosis (HCC)   HLD (hyperlipidemia)   Small bowel obstruction-General surgery has been consulted, Dr. Dahlia Byes -IVF, n.p.o. -Per ED provider, Dr. Beather Arbour discussed with Dr. Dahlia Byes and general surgery service recommend holding off on NG tube as patient is not actively vomiting  CKD 3-stable - sCr in 2020  was 1.6 - 2.20 -Creatinine clearance is calculated as 29 -Serum creatinine in the emergency department was 1.94, GFR 32 -Continue IVF  Hypertension-controlled -Resumed  home amlodipine, lisinopril  Hypothyroid-resumed home levothyroxine 112 mcg before breakfast  Depression/anxiety-resumed home sertraline 50 mg daily  History of paroxysmal atrial fibrillation-currently not on blood thinner  Chart reviewed  Sinoatrial node dysfunction s/p dual PPM in 02/19/2017 Renal cell carcinoma status post unilateral nephrectomy left in 2016/2017 Paroxysmal atrial fibrillation status post ex lap 03/13/2016 History of prostate cancer s/p radiation   DVT prophylaxis: Enoxaparin Code Status: DNR, patient does not want to be intubated in cardiac arrest, he is open to be intubated for surgical reasons Diet: N.p.o. Family Communication: A.m. provider to call the a.m. Disposition Plan: Pending clinical course Consults called: General surgery Admission status: Inpatient with telemetry  Past Medical History:  Diagnosis Date  . AAA (abdominal aortic aneurysm) ([place])   . Actinic keratosis   . Aortic atherosclerosis ([place])   . Atrial fibrillation ([place]) 03/2016   brief spell  . B12 deficiency   . Basal cell carcinoma 10/30/2016   L lat crown  . Bradycardia   . Bradycardia 02/2017   Pacer placed  . Chronic kidney disease (CKD), stage III (moderate) (HCC)   . Hyperlipidemia   . Hypertension   . Hypothyroidism   . Osteoarthritis   . Prostate cancer ([place]) 2001   Rad tx's + seed implants  . Renal cancer, left ([place]) 03/2016   Left Renal Nephrectomy  . Sick sinus syndrome [facility])    Pacemaker   Past Surgical History:  Procedure Laterality Date  . CATARACT EXTRACTION, BILATERAL    . COLONOSCOPY    . EXCISIONAL HEMORRHOIDECTOMY    . INSERTION PROSTATE RADIATION SEED    . LAPAROSCOPIC NEPHRECTOMY, HAND ASSISTED Left 03/08/2016   Procedure: HAND ASSISTED LAPAROSCOPIC NEPHRECTOMY;  Surgeon: Nickie Retort, MD;  Location: [facility] ORS;  Service: Urology;  Laterality: Left;  . LAPAROTOMY N/A 03/13/2016   Procedure: EXPLORATORY LAPAROTOMY;  Surgeon: Festus Aloe, MD;   Location: [facility] ORS;  Service: Urology;  Laterality: N/A;  . PACEMAKER INSERTION N/A 02/20/2017   Procedure: INSERTION PACEMAKER;  Surgeon: Isaias Cowman, MD;  Location: [facility] ORS;  Service: Cardiovascular;  Laterality: N/A;   Social History:  reports that he has never smoked. He has never used smokeless tobacco. He reports that he does not drink alcohol and does not use drugs.  No Known Allergies Family History  Problem Relation Age of Onset  . Hypertension Mother   . Breast cancer Mother   . Colon cancer Father   . Prostate cancer Neg Hx   . Bladder Cancer Neg Hx   . Kidney cancer Neg Hx    Family history: Family history reviewed and not pertinent  Prior to Admission medications   Medication Sig Start Date End Date Taking? Authorizing Provider  acetaminophen (TYLENOL) 500 MG tablet Take 1,000 mg by mouth daily. And 1000mg  twice a day prn   Yes [provider]  amLODipine (NORVASC) 10 MG tablet Take 1 tablet (10 mg total) by mouth daily. 11/22/18  Yes Jearld Fenton, NP  aspirin EC 81 MG tablet Take 81 mg by mouth daily.    Yes [provider]  Cyanocobalamin 1000 MCG TBCR Take 1,000 mcg by mouth daily.    Yes [provider]  docusate sodium (COLACE) 100 MG capsule Take 100 mg by mouth daily.   Yes [provider]  levothyroxine (SYNTHROID, LEVOTHROID) 112 MCG tablet  Take 112 mcg by mouth daily before breakfast.   Yes [provider]  lisinopril (PRINIVIL,ZESTRIL) 20 MG tablet Take 20 mg by mouth daily.   Yes [provider]  Multiple Vitamin (MULTI-VITAMINS) TABS Take 1 tablet by mouth daily.   Yes [provider]  PROCTO-MED HC 2.5 % rectal cream Apply 1 application topically as directed. 03/30/20  Yes [provider]  sertraline (ZOLOFT) 50 MG tablet Take 50 mg by mouth daily. 04/13/20  Yes [provider]   Physical Exam: Vitals:   04/23/20 2257 04/23/20 2300 04/23/20 2330  BP: (!) 175/92 (!)  155/81 140/76  Pulse: 78 67 70  Resp: 18 14 (!) 21  Temp: 98.8 F (37.1 C)    TempSrc: Oral    SpO2: 98% 100% 98%   Constitutional: appears younger than chronological age, NAD, calm, comfortable Eyes: PERRL, lids and conjunctivae normal ENMT: Mucous membranes are moist. Posterior pharynx clear of any exudate or lesions. Age-appropriate dentition. Hearing appropriate Neck: normal, supple, no masses, no thyromegaly Respiratory: clear to auscultation bilaterally, no wheezing, no crackles. Normal respiratory effort. No accessory muscle use.  Cardiovascular: Regular rate and rhythm, no murmurs / rubs / gallops. No extremity edema. 2+ pedal pulses. No carotid bruits.  Abdomen: no tenderness, no masses palpated, no hepatosplenomegaly. Bowel sounds positive.  Musculoskeletal: no clubbing / cyanosis. No joint deformity upper and lower extremities. Good ROM, no contractures, no atrophy. Normal muscle tone.  Skin: no rashes, lesions, ulcers. No induration Neurologic: Sensation intact. Strength 5/5 in all 4.  Psychiatric: Normal judgment and insight. Alert and oriented x 3. Normal mood.   EKG: Independently reviewed, showing atrial fibrillation with rate of 76, QTc 493  Chest x-ray on Admission: Personally reviewed and I agree with radiologist reading as below.  DG Chest Port 1 View  Result Date: 04/23/2020 CLINICAL DATA:  Lower abdominal pain. EXAM: PORTABLE CHEST 1 VIEW COMPARISON:  February 20, 2017 FINDINGS: There is a dual lead AICD. A trace amount of linear atelectasis is seen within the left lung base. There is no evidence of acute infiltrate, pleural effusion or pneumothorax. The heart size and mediastinal contours are within normal limits. There is moderate severity calcification of the aortic arch. Degenerative changes seen within the mid and lower thoracic spine. IMPRESSION: Stable exam without acute or active cardiopulmonary disease. Electronically Signed   By: Virgina Norfolk M.D.   On:  04/23/2020 23:42   CT Angio Abd/Pel W and/or Wo Contrast  Result Date: 04/24/2020 CLINICAL DATA:  Abdominal pain and vomiting. EXAM: CTA ABDOMEN AND PELVIS WITHOUT AND WITH CONTRAST TECHNIQUE: Multidetector CT imaging of the abdomen and pelvis was performed using the standard protocol during bolus administration of intravenous contrast. Multiplanar reconstructed images and MIPs were obtained and reviewed to evaluate the vascular anatomy. CONTRAST:  78mL OMNIPAQUE IOHEXOL 350 MG/ML SOLN COMPARISON:  April 28, 2019 and October 28, 2018 FINDINGS: VASCULAR Aorta: Marked severity calcification and atherosclerosis with 3.6 cm x 2.9 cm infrarenal of aneurysmal dilatation. There is no evidence of dissection, vasculitis or significant stenosis. Celiac: 9.5 mm dilatation just beyond its origin, without evidence of dissection, vasculitis or significant stenosis. SMA: Patent without evidence of aneurysm, dissection, vasculitis or significant stenosis. Renals: The right renal artery is patent without evidence of aneurysm, dissection, vasculitis, fibromuscular dysplasia or significant stenosis. IMA: Patent without evidence of aneurysm, dissection, vasculitis or significant stenosis. Inflow: Marked severity calcification and atherosclerosis without evidence of aneurysmal dilatation, dissection, vasculitis or significant stenosis. Proximal Outflow: Marked  severity calcification and atherosclerosis is seen involving the bilateral common femoral and visualized portions of the superficial and profunda femoral arteries, without evidence of aneurysm, dissection, vasculitis or significant stenosis. Veins: No obvious venous abnormality within the limitations of this arterial phase study. Review of the MIP images confirms the above findings. NON-VASCULAR Lower chest: No acute abnormality. Hepatobiliary: There is diffuse fatty infiltration of the liver parenchyma. Numerous punctate calcified granulomas are seen scattered throughout the  liver. A stable 1.1 cm cystic appearing areas seen within the right lobe. No gallstones, gallbladder wall thickening, or biliary dilatation. Pancreas: Punctate calcifications are seen along the junction of the pancreatic body and head. A stable 1.7 cm x 0.8 cm cystic appearing area is seen within the tail of the pancreas (axial CT image 54, CT series number 4). Spleen: Numerous punctate calcified granulomas are seen scattered throughout the spleen. Adrenals/Urinary Tract: Adrenal glands are unremarkable. The left kidney is absent. The right kidney is normal in size, without renal calculi or hydronephrosis. 3.1 cm x 1.7 cm and 1.5 cm diameter cysts are seen within the lower pole of the right kidney. Bladder is unremarkable. Stomach/Bowel: There is a small hiatal hernia. Appendix appears normal. Multiple dilated small bowel loops are seen throughout the abdomen (maximum small bowel diameter of approximately 3.0 cm). While these small bowel loops extend through a ventral hernia, a transition zone is seen along the anterior aspect of the left lower quadrant (axial CT images 127 through 134, CT series number 4). Noninflamed diverticula are seen throughout the large bowel. Lymphatic: No abnormal abdominal or pelvic lymph nodes are identified. Reproductive: Numerous prostate radiation implantation seeds are seen. Other: A stable 7.2 cm x 3.4 cm ventral hernia is noted along the midline of the upper abdomen. This contains a segment of nondilated transverse colon. A stable 6.0 cm x 2.7 cm ventral hernia, containing a short segment of dilated small bowel, is noted just above the umbilicus. Musculoskeletal: Multilevel degenerative changes seen throughout the lumbar spine. IMPRESSION: 1. Small bowel obstruction with a transition zone seen within the left lower quadrant. 2. Marked severity diffuse calcification and atherosclerosis with stable aneurysmal dilatation of the infrarenal abdominal aorta. 3. Stable ventral hernias, as  described above. 4. Absent left kidney. 5. Colonic diverticulosis. 6. Small hiatal hernia. 7. Stable cystic appearing area within the tail of the pancreas. Correlation with surveillance MRI is recommended to confirm stability and exclude the presence of an underlying neoplastic process. Aortic Atherosclerosis (ICD10-I70.0). Electronically Signed   By: Virgina Norfolk M.D.   On: 04/24/2020 00:23   Labs on Admission: I have personally reviewed following labs  CBC: Recent Labs  Lab 04/23/20 2301  WBC 11.5*  HGB 13.0  HCT 38.9*  MCV 96.3  PLT 892   Basic Metabolic Panel: Recent Labs  Lab 04/23/20 2301  NA 132*  K 4.4  CL 98  CO2 21*  GLUCOSE 130*  BUN 38*  CREATININE 1.94*  CALCIUM 9.6   GFR: CrCl cannot be calculated (Unknown ideal weight.).  Liver Function Tests: Recent Labs  Lab 04/23/20 2301  AST 22  ALT 16  ALKPHOS 87  BILITOT 0.7  PROT 8.0  ALBUMIN 4.4   Recent Labs  Lab 04/23/20 2301  LIPASE 38   Urine analysis:    Component Value Date/Time   COLORURINE YELLOW (A) 04/23/2020 2301   APPEARANCEUR CLEAR (A) 04/23/2020 2301   LABSPEC 1.040 (H) 04/23/2020 2301   PHURINE 5.0 04/23/2020 2301   GLUCOSEU NEGATIVE 04/23/2020 2301  HGBUR NEGATIVE 04/23/2020 2301   BILIRUBINUR NEGATIVE 04/23/2020 2301   KETONESUR NEGATIVE 04/23/2020 2301   PROTEINUR NEGATIVE 04/23/2020 2301   NITRITE NEGATIVE 04/23/2020 2301   LEUKOCYTESUR NEGATIVE 04/23/2020 2301   Ambera Fedele N Amariz Flamenco D.O. [facility]  If 7AM-7PM, please contact day coverage provider [email or web address]  04/24/2020, 1:38 AM

## 2020-04-24 NOTE — ED Notes (Signed)
Pt assisted with urinal, 500 ml of yellow straw colored urine emptied from urinal

## 2020-04-25 ENCOUNTER — Inpatient Hospital Stay: Payer: Medicare Other

## 2020-04-25 DIAGNOSIS — Z978 Presence of other specified devices: Secondary | ICD-10-CM

## 2020-04-25 LAB — COMPREHENSIVE METABOLIC PANEL
ALT: 13 U/L (ref 0–44)
AST: 19 U/L (ref 15–41)
Albumin: 3.3 g/dL — ABNORMAL LOW (ref 3.5–5.0)
Alkaline Phosphatase: 66 U/L (ref 38–126)
Anion gap: 6 (ref 5–15)
BUN: 22 mg/dL (ref 8–23)
CO2: 23 mmol/L (ref 22–32)
Calcium: 8.3 mg/dL — ABNORMAL LOW (ref 8.9–10.3)
Chloride: 107 mmol/L (ref 98–111)
Creatinine, Ser: 1.32 mg/dL — ABNORMAL HIGH (ref 0.61–1.24)
GFR, Estimated: 51 mL/min — ABNORMAL LOW (ref >60)
Glucose, Bld: 94 mg/dL (ref 70–99)
Potassium: 4.1 mmol/L (ref 3.5–5.1)
Sodium: 136 mmol/L (ref 135–145)
Total Bilirubin: 0.9 mg/dL (ref 0.3–1.2)
Total Protein: 6.2 g/dL — ABNORMAL LOW (ref 6.5–8.1)

## 2020-04-25 LAB — CBC
HCT: 33.6 % — ABNORMAL LOW (ref 39.0–52.0)
Hemoglobin: 11.1 g/dL — ABNORMAL LOW (ref 13.0–17.0)
MCH: 32.3 pg (ref 26.0–34.0)
MCHC: 33 g/dL (ref 30.0–36.0)
MCV: 97.7 fL (ref 80.0–100.0)
Platelets: 223 10*3/uL (ref 150–400)
RBC: 3.44 MIL/uL — ABNORMAL LOW (ref 4.22–5.81)
RDW: 13.2 % (ref 11.5–15.5)
WBC: 7.1 10*3/uL (ref 4.0–10.5)
nRBC: 0 % (ref 0.0–0.2)

## 2020-04-25 LAB — PROTIME-INR
INR: 1.1 (ref 0.8–1.2)
Prothrombin Time: 13.7 seconds (ref 11.4–15.2)

## 2020-04-25 NOTE — Progress Notes (Signed)
[facility] NAME: Frank Bartlett    MR#:  [id number]  DATE OF BIRTH:  [birth year]  SUBJECTIVE:  patient came in from [facility] with abdominal pain two days ago. Currently has NG tube placed yes in the ER. Feels very uncomfortable in the throat. Has some increase oral secretions due to uneasiness. Passing gas.  REVIEW OF SYSTEMS:   Review of Systems  Constitutional: Negative for chills, fever and weight loss.  HENT: Positive for sore throat. Negative for ear discharge, ear pain and nosebleeds.   Eyes: Negative for blurred vision, pain and discharge.  Respiratory: Negative for sputum production, shortness of breath, wheezing and stridor.   Cardiovascular: Negative for chest pain, palpitations, orthopnea and PND.  Gastrointestinal: Positive for abdominal pain. Negative for diarrhea, nausea and vomiting.  Genitourinary: Negative for frequency and urgency.  Musculoskeletal: Negative for back pain and joint pain.  Neurological: Negative for sensory change, speech change, focal weakness and weakness.  Psychiatric/Behavioral: Negative for depression and hallucinations. The patient is not nervous/anxious.    Tolerating Diet:npo Tolerating PT:   DRUG ALLERGIES:  No Known Allergies  VITALS:  Blood pressure (!) 141/69, pulse (!) 59, temperature 99.3 F (37.4 C), temperature source Oral, resp. rate 16, height 5\' 9"  (1.753 m), weight 83.5 kg, SpO2 94 %.  PHYSICAL EXAMINATION:   Physical Exam  GENERAL:  86 y.o.-year-old patient lying in the bed with no acute distress.  HEENT: Head atraumatic, normocephalic. Oropharynx and nasopharynx clear.  NG+ LUNGS: Normal breath sounds bilaterally, no wheezing, rales, rhonchi. No use of accessory muscles of respiration.  CARDIOVASCULAR: S1, S2 normal. No murmurs, rubs, or gallops.  ABDOMEN: Soft, nontender, nondistended. Few Bowel sounds present. No organomegaly or mass.  EXTREMITIES: No cyanosis,  clubbing or edema b/l.    NEUROLOGIC: Cranial nerves II through XII are intact. No focal Motor or sensory deficits b/l.   PSYCHIATRIC:  patient is alert and oriented x 3.  SKIN: No obvious rash, lesion, or ulcer.   LABORATORY PANEL:  CBC Recent Labs  Lab 04/25/20 0506  WBC 7.1  HGB 11.1*  HCT 33.6*  PLT 223    Chemistries  Recent Labs  Lab 04/25/20 0506  NA 136  K 4.1  CL 107  CO2 23  GLUCOSE 94  BUN 22  CREATININE 1.32*  CALCIUM 8.3*  AST 19  ALT 13  ALKPHOS 66  BILITOT 0.9   Cardiac Enzymes No results for input(s): TROPONINI in the last 168 hours. RADIOLOGY:  DG Abdomen 1 View  Result Date: 04/24/2020 CLINICAL DATA:  Evaluate NG tube EXAM: ABDOMEN - 1 VIEW COMPARISON:  None. FINDINGS: The NG tube terminates in the stomach. IMPRESSION: NG tube terminates in the stomach. Electronically Signed   By: Dorise Bullion III M.D   On: 04/24/2020 14:57   DG Chest Port 1 View  Result Date: 04/23/2020 CLINICAL DATA:  Lower abdominal pain. EXAM: PORTABLE CHEST 1 VIEW COMPARISON:  February 20, 2017 FINDINGS: There is a dual lead AICD. A trace amount of linear atelectasis is seen within the left lung base. There is no evidence of acute infiltrate, pleural effusion or pneumothorax. The heart size and mediastinal contours are within normal limits. There is moderate severity calcification of the aortic arch. Degenerative changes seen within the mid and lower thoracic spine. IMPRESSION: Stable exam without acute or active cardiopulmonary disease. Electronically Signed   By: Virgina Norfolk M.D.   On: 04/23/2020 23:42   DG  ABD ACUTE 2+V W 1V CHEST  Result Date: 04/25/2020 CLINICAL DATA:  Follow-up ileus EXAM: DG ABDOMEN ACUTE WITH 1 VIEW CHEST COMPARISON:  04/24/2020, 04/23/2020 FINDINGS: Cardiac shadow is mildly prominent but stable. Pacing device is again seen. Gastric catheter is noted within the stomach. The lungs show minimal left basilar atelectasis. No sizable effusion is  noted. No bony abnormality is seen. Scattered large and small bowel gas is noted. No obstructive changes are seen. No abnormal mass is noted. Diffuse vascular calcifications are seen. Prostate therapy seeds are noted. No acute bony abnormality is noted. Stable degenerative change of lumbar spine is seen IMPRESSION: No acute abnormality in the chest. Scattered large and small bowel gas without definitive obstructive change. Electronically Signed   By: Inez Catalina M.D.   On: 04/25/2020 08:13   CT Angio Abd/Pel W and/or Wo Contrast  Result Date: 04/24/2020 CLINICAL DATA:  Abdominal pain and vomiting. EXAM: CTA ABDOMEN AND PELVIS WITHOUT AND WITH CONTRAST TECHNIQUE: Multidetector CT imaging of the abdomen and pelvis was performed using the standard protocol during bolus administration of intravenous contrast. Multiplanar reconstructed images and MIPs were obtained and reviewed to evaluate the vascular anatomy. CONTRAST:  53mL OMNIPAQUE IOHEXOL 350 MG/ML SOLN COMPARISON:  April 28, 2019 and October 28, 2018 FINDINGS: VASCULAR Aorta: Marked severity calcification and atherosclerosis with 3.6 cm x 2.9 cm infrarenal of aneurysmal dilatation. There is no evidence of dissection, vasculitis or significant stenosis. Celiac: 9.5 mm dilatation just beyond its origin, without evidence of dissection, vasculitis or significant stenosis. SMA: Patent without evidence of aneurysm, dissection, vasculitis or significant stenosis. Renals: The right renal artery is patent without evidence of aneurysm, dissection, vasculitis, fibromuscular dysplasia or significant stenosis. IMA: Patent without evidence of aneurysm, dissection, vasculitis or significant stenosis. Inflow: Marked severity calcification and atherosclerosis without evidence of aneurysmal dilatation, dissection, vasculitis or significant stenosis. Proximal Outflow: Marked severity calcification and atherosclerosis is seen involving the bilateral common femoral and  visualized portions of the superficial and profunda femoral arteries, without evidence of aneurysm, dissection, vasculitis or significant stenosis. Veins: No obvious venous abnormality within the limitations of this arterial phase study. Review of the MIP images confirms the above findings. NON-VASCULAR Lower chest: No acute abnormality. Hepatobiliary: There is diffuse fatty infiltration of the liver parenchyma. Numerous punctate calcified granulomas are seen scattered throughout the liver. A stable 1.1 cm cystic appearing areas seen within the right lobe. No gallstones, gallbladder wall thickening, or biliary dilatation. Pancreas: Punctate calcifications are seen along the junction of the pancreatic body and head. A stable 1.7 cm x 0.8 cm cystic appearing area is seen within the tail of the pancreas (axial CT image 54, CT series number 4). Spleen: Numerous punctate calcified granulomas are seen scattered throughout the spleen. Adrenals/Urinary Tract: Adrenal glands are unremarkable. The left kidney is absent. The right kidney is normal in size, without renal calculi or hydronephrosis. 3.1 cm x 1.7 cm and 1.5 cm diameter cysts are seen within the lower pole of the right kidney. Bladder is unremarkable. Stomach/Bowel: There is a small hiatal hernia. Appendix appears normal. Multiple dilated small bowel loops are seen throughout the abdomen (maximum small bowel diameter of approximately 3.0 cm). While these small bowel loops extend through a ventral hernia, a transition zone is seen along the anterior aspect of the left lower quadrant (axial CT images 127 through 134, CT series number 4). Noninflamed diverticula are seen throughout the large bowel. Lymphatic: No abnormal abdominal or pelvic lymph nodes are identified. Reproductive: Numerous  prostate radiation implantation seeds are seen. Other: A stable 7.2 cm x 3.4 cm ventral hernia is noted along the midline of the upper abdomen. This contains a segment of  nondilated transverse colon. A stable 6.0 cm x 2.7 cm ventral hernia, containing a short segment of dilated small bowel, is noted just above the umbilicus. Musculoskeletal: Multilevel degenerative changes seen throughout the lumbar spine. IMPRESSION: 1. Small bowel obstruction with a transition zone seen within the left lower quadrant. 2. Marked severity diffuse calcification and atherosclerosis with stable aneurysmal dilatation of the infrarenal abdominal aorta. 3. Stable ventral hernias, as described above. 4. Absent left kidney. 5. Colonic diverticulosis. 6. Small hiatal hernia. 7. Stable cystic appearing area within the tail of the pancreas. Correlation with surveillance MRI is recommended to confirm stability and exclude the presence of an underlying neoplastic process. Aortic Atherosclerosis (ICD10-I70.0). Electronically Signed   By: Virgina Norfolk M.D.   On: 04/24/2020 00:23   ASSESSMENT AND PLAN:   JASER FULLEN is a 86 y.o. male with medical history significant for hypothyroid, hypertension, depression, ventral hernia, history of renal cell carcinoma status post left renal nephrectomy, history of AAA, history of sinoatrial node dysfunction presented to the emergency department from [facility] via EMS for chief concerns of abdominal pain.  Small bowel obstruction/Ileus -General surgery has been consulted, Dr. Dahlia Byes-- conservative management. -IVF, n.p.o. -NG tube placed yesterday. NG output is not recorded  -- daily x-rays  CKD 3a-stable - sCr in 2020 was 1.6 - 2.20 -Creatinine clearance is calculated as 29 -Serum creatinine in the emergency department was 1.94, GFR 32--creat down to 1.3 -Continue IVF  Hypertension-controlled -Resumed home amlodipine, lisinopril  Hypothyroid-resumed home levothyroxine 112 mcg before breakfast  Depression/anxiety-resumed home sertraline 50 mg daily  History of paroxysmal atrial fibrillation-currently not on blood thinner  Sinoatrial  node dysfunction s/p dual PPM in 02/19/2017  Renal cell carcinoma status post unilateral nephrectomy left in 2016/2017  History of prostate cancer s/p radiation   DVT prophylaxis: Enoxaparin Code Status: DNR, patient does not want to be intubated in cardiac arrest, he is open to be intubated for surgical reasons--prior to admission Diet: N.p.o. Family Communication:son Edythe Clarity on the phone today Disposition Plan: Pending clinical course Consults called: General surgery Admission status: Inpatient     Remains inpatient appropriate because:IV treatments appropriate due to intensity of illness or inability to take PO   Dispo: The patient is from: ALF              Anticipated d/c is to: ALF              Anticipated d/c date is: 2 days              Patient currently is not medically stable to d/c.  SBO, cont conservative mnx per surgery recommendations      TOTAL TIME TAKING CARE OF THIS PATIENT: 25 minutes.  >50% time spent on counselling and coordination of care  Note: This dictation was prepared with Dragon dictation along with smaller phrase technology. Any transcriptional errors that result from this process are unintentional.  Fritzi Mandes M.D    [facility]   CC: Primary care physician; Venia Carbon, MDPatient ID: [name], male   DOB: 01-18-1926, 86 y.o.   MRN: [id number]

## 2020-04-25 NOTE — TOC Initial Note (Signed)
Transition of Care [facility]) - Initial/Assessment Note    Patient Details  Name: Frank Bartlett MRN: [id number] Date of Birth: Oct 31, 1925  [facility] CM/SW Contact:    Shelbie Hutching, RN Phone Number: 04/25/2020, 3:29 PM  Clinical Narrative:                 Patient admitted to the hospital with a small bowel obstruction requiring NGT for decompression.  RNCM was able to meet with patient at the bedside.  Patient is from [facility] assisted living.  Patient reports that he has been at [facility] for [facility] years but only in assisted living for the past 3 years.  Patient has a cane and walker at home and hopes to return to ALF at discharge.   NGT clamping trial today if tolerated patient can start a diet.   TOC team will cont to follow.   Expected Discharge Plan: Assisted Living Barriers to Discharge: Continued Medical Work up   Patient Goals and CMS Choice Patient states their goals for this hospitalization and ongoing recovery are:: To go back to his assisted living apartment at [facility].gov Compare Post Acute Care list provided to:: Patient Choice offered to / list presented to : Patient  Expected Discharge Plan and Services Expected Discharge Plan: Assisted Living   Discharge Planning Services: CM Consult   Living arrangements for the past 2 months: [place]                 DME Arranged: N/A DME Agency: NA                  Prior Living Arrangements/Services Living arrangements for the past 2 months: [place] Lives with:: Self Patient language and need for interpreter reviewed:: Yes        Need for Family Participation in Patient Care: Yes (Comment) Care giver support system in place?: Yes (comment) (children) Current home services: DME (cane and walker) Criminal Activity/Legal Involvement Pertinent to Current Situation/Hospitalization: No - Comment as needed  Activities of Daily Living Home Assistive  Devices/Equipment: Walker (specify type) ADL Screening (condition at time of admission) Patient's cognitive ability adequate to safely complete daily activities?: Yes Is the patient deaf or have difficulty hearing?: No Does the patient have difficulty seeing, even when wearing glasses/contacts?: No Does the patient have difficulty concentrating, remembering, or making decisions?: No Patient able to express need for assistance with ADLs?: Yes Does the patient have difficulty dressing or bathing?: No Independently performs ADLs?: Yes (appropriate for developmental age) Does the patient have difficulty walking or climbing stairs?: No Weakness of Legs: None Weakness of Arms/Hands: None  Permission Sought/Granted Permission sought to share information with : Case Nature conservation officer Permission granted to share information with : Yes, Release of Information Signed     Permission granted to share info w AGENCY: [place]        Emotional Assessment Appearance:: Appears stated age Attitude/Demeanor/Rapport: Engaged Affect (typically observed): Accepting Orientation: : Oriented to Self,Oriented to Place,Oriented to  Time,Oriented to Situation Alcohol / Substance Use: Not Applicable Psych Involvement: No (comment)  Admission diagnosis:  Ileus ([place]) [K56.7] Nausea [R11.0] SBO (small bowel obstruction) ([place]) [K56.609] Lower abdominal pain [R10.30] AKI (acute kidney injury) ([place]) [N17.9] Patient Active Problem List   Diagnosis Date Noted  . SBO (small bowel obstruction) ([place]) 04/24/2020  . Mood disorder ([place]) 08/06/2019  . HLD (hyperlipidemia) 04/24/2019  . Sick sinus syndrome ([place])   .  AAA (abdominal aortic aneurysm) ([place])   . Aortic atherosclerosis ([place])   . Hypertension   . Hypothyroidism   . Osteoarthritis of right knee   . CKD (chronic kidney disease) stage 3, GFR 30-59 ml/min (HCC) 06/01/2016  . Paroxysmal atrial flutter ([place]) 05/16/2016  . History of  renal cell cancer 03/26/2016   PCP:  Venia Carbon, MD Pharmacy:   [facility] [place], [place] [address] [place] [address] Phone: [phone] Fax: [phone]     Social Determinants of Health ([place]) Interventions    Readmission Risk Interventions No flowsheet data found.

## 2020-04-25 NOTE — Progress Notes (Signed)
[name] SURGICAL PROGRESS NOTE (cpt [phone])  Hospital Day(s): 1.   Interval History: Patient seen and examined, no acute events or new complaints overnight. Patient reports he overall is feeling okay but his biggest complaint is related to NGT and cough associated with this. He denied any fever, chills, nausea, emesis. Labs are reassuring this morning. His sCr is elevated at 1.32, but I suspect this is close to his baseline. NGT output not recorded, appears minimal and clear. He is passing flatus this morning.   Review of Systems:  Constitutional: denies fever, chills  HEENT: + cough, denied congestion Respiratory: denies any shortness of breath  Cardiovascular: denies chest pain or palpitations  Gastrointestinal: denies abdominal pain, N/V, or diarrhea/and bowel function as per interval history Genitourinary: denies burning with urination or urinary frequency   Vital signs in last 24 hours: [min-max] current  Temp:  [98.2 F (36.8 C)-98.7 F (37.1 C)] 98.4 F (36.9 C) (12/20 0417) Pulse Rate:  [59-67] 62 (12/20 0417) Resp:  [16-22] 18 (12/20 0417) BP: (130-148)/(69-78) 145/70 (12/20 0417) SpO2:  [58 %-98 %] 58 % (12/20 0417) Weight:  [83.5 kg] 83.5 kg (12/19 0726)     Height: 5\' 9"  (175.3 cm) Weight: 83.5 kg BMI (Calculated): 27.16   Intake/Output last 2 shifts:  12/19 0701 - 12/20 0700 In: 2000 [I.V.:2000] Out: -    Physical Exam:  Constitutional: alert, cooperative and no distress  HENT: normocephalic without obvious abnormality, NGT in place; output clear Eyes: PERRL, EOM's grossly intact and symmetric  Respiratory: breathing non-labored at rest  Cardiovascular: regular rate and sinus rhythm  Gastrointestinal: Soft, non-tender, and non-distended, no rebound/guarding. Ventral hernia in upper abdomen, soft, easily reduces Musculoskeletal: UE and LE FROM, no edema or wounds, motor and sensation grossly intact, NT    Labs:  CBC Latest Ref Rng & Units  04/25/2020 04/24/2020 04/23/2020  WBC 4.0 - 10.5 K/uL 7.1 6.9 11.5(H)  Hemoglobin 13.0 - 17.0 g/dL 11.1(L) 11.7(L) 13.0  Hematocrit 39.0 - 52.0 % 33.6(L) 34.3(L) 38.9(L)  Platelets 150 - 400 K/uL 223 225 267   CMP Latest Ref Rng & Units 04/25/2020 04/24/2020 04/23/2020  Glucose 70 - 99 mg/dL 94 120(H) 130(H)  BUN 8 - 23 mg/dL 22 33(H) 38(H)  Creatinine 0.61 - 1.24 mg/dL 1.32(H) 1.52(H) 1.94(H)  Sodium 135 - 145 mmol/L 136 134(L) 132(L)  Potassium 3.5 - 5.1 mmol/L 4.1 4.7 4.4  Chloride 98 - 111 mmol/L 107 103 98  CO2 22 - 32 mmol/L 23 22 21(L)  Calcium 8.9 - 10.3 mg/dL 8.3(L) 8.4(L) 9.6  Total Protein 6.5 - 8.1 g/dL 6.2(L) - 8.0  Total Bilirubin 0.3 - 1.2 mg/dL 0.9 - 0.7  Alkaline Phos 38 - 126 U/L 66 - 87  AST 15 - 41 U/L 19 - 22  ALT 0 - 44 U/L 13 - 16     Imaging studies:   KUB + CXR (04/25/2020) personally reviewed with gas in colon and no definitive obstructive small bowel gas pattern, and radiologist report reviewed:  IMPRESSION: No acute abnormality in the chest.  Scattered large and small bowel gas without definitive obstructive change.   Assessment/Plan: (ICD-10's: K77.609) 86 y.o. male with clinically improved partial SBO and reducible ventral hernia, complicated by pertinent comorbidities including advanced age and multiple comorbid conditions.   - Will get NGT clamping trial. Clamp NGT x4 hours. Check residuals, and if less than 150 ccs, we can consider removal and initiation of diet    - NPO for now  -  Continue IVF Resuscitation   - monitor abdominal examination; on-going bowel function  - Serial KUBs as needed  - Pain control prn (minimize narcotics as feasible); antiemetics prn  - No emergent surgical intervention at this time  - Mobilization as toelrated   - Further management per primary service; we will follow    All of the above findings and recommendations were discussed with the patient, and the medical team, and all of patient's questions were  answered to his expressed satisfaction.   -- [name], PA-C [facility] 04/25/2020, 7:19 AM [phone] M-F: 7am - 4pm

## 2020-04-26 ENCOUNTER — Inpatient Hospital Stay: Payer: Medicare Other

## 2020-04-26 DIAGNOSIS — R1319 Other dysphagia: Secondary | ICD-10-CM

## 2020-04-26 MED ORDER — BOOST / RESOURCE BREEZE PO LIQD CUSTOM
1.0000 | Freq: Three times a day (TID) | ORAL | Status: DC
  Administered 2020-04-27 – 2020-04-28 (×4): 1 via ORAL

## 2020-04-26 NOTE — Progress Notes (Signed)
Objective Swallowing Evaluation: Type of Study: MBS-Modified Barium Swallow Study   Patient Details  Name: Frank CIOFFI MRN: [id number] Date of Birth: Mar 27, 1926  Today's Date: 04/26/2020 Time: No data recorded-No data recorded No data recorded  Past Medical History:  Past Medical History:  Diagnosis Date   AAA (abdominal aortic aneurysm) (HCC)    Actinic keratosis    Aortic atherosclerosis (HCC)    Atrial fibrillation (HCC) 03/2016   brief spell   B12 deficiency    Basal cell carcinoma 10/30/2016   L lat crown   Bradycardia    Bradycardia 02/2017   Pacer placed   Chronic kidney disease (CKD), stage III (moderate) (HCC)    Hyperlipidemia    Hypertension    Hypothyroidism    Osteoarthritis    Prostate cancer (HCC) 2001   Rad tx's + seed implants   Renal cancer, left (HCC) 03/2016   Left Renal Nephrectomy   Sick sinus syndrome (HCC)    Pacemaker   Past Surgical History:  Past Surgical History:  Procedure Laterality Date   CATARACT EXTRACTION, BILATERAL     COLONOSCOPY     EXCISIONAL HEMORRHOIDECTOMY     INSERTION PROSTATE RADIATION SEED     LAPAROSCOPIC NEPHRECTOMY, HAND ASSISTED Left 03/08/2016   Procedure: HAND ASSISTED LAPAROSCOPIC NEPHRECTOMY;  Surgeon: Hildred Laser, MD;  Location: [facility] ORS;  Service: Urology;  Laterality: Left;   LAPAROTOMY N/A 03/13/2016   Procedure: EXPLORATORY LAPAROTOMY;  Surgeon: Jerilee Field, MD;  Location: [facility] ORS;  Service: Urology;  Laterality: N/A;   PACEMAKER INSERTION N/A 02/20/2017   Procedure: INSERTION PACEMAKER;  Surgeon: Marcina Millard, MD;  Location: [facility] ORS;  Service: Cardiovascular;  Laterality: N/A;   HPI: RUBE BOSWORTH is a 86 y.o. male with medical history significant for hypothyroid, hypertension, depression, ventral hernia, history of renal cell carcinoma status post left renal nephrectomy, history of AAA, history of sinoatrial node dysfunction presented to the emergency department from [facility]  via EMS for chief concerns of abdominal pain. NG tube placed in the ED, removed 04/26/20. Pt was started on clear liquids, however reported new onset coughing when drinking.   Subjective: Pt reports new onset coughing with liquids    Assessment / Plan / Recommendation  CHL IP CLINICAL IMPRESSIONS 04/26/2020  Clinical Impression Patient presents with mild pharyngeal dysphagia, with trace aspiration occuring with thin liquids after the swallow. Suspect normative age-related changes in swallowing are exacerbated by acute illness/decreased ability to compensate. Oral stage is characterized by adequate but slow, careful mastication, functional bolus formation and anterior to posterior transfer. Pharyngeal swallow initiation occurs at the level of the valleculae, which is within functional limits for age. Pharyngeal stage is noted for mildly reduced hyolaryngeal excursion; there is mild residue in the valleculae and pyriform sinuses with thin liquids. Residue of thin was penetrated after the swallow, more so with larger and consecutive boluses, with trace aspiration x2.  Cued cough/throat clear was effective in clearing the laryngeal vestibule. Chin tuck worsened aspiration, as pt's laryngeal vestibule opened between swallows with significant deep penetration. With nectar thick and heavier boluses, there is improved stripping of residue, and pt maintains airway closure, even with multiple swallows. Recommend mechanical soft diet and nectar thick liquids as a temporary measure as pt recovers from acute illness/is more ambulatory. Good potential for advancement to thin liquids at bedside, with small sips, throat clear as compensations.  SLP Visit Diagnosis Dysphagia, pharyngeal phase (R13.13)  Attention and concentration deficit following --  Frontal lobe  and executive function deficit following --  Impact on safety and function Mild aspiration risk;Moderate aspiration risk      CHL IP TREATMENT  RECOMMENDATION 04/26/2020  Treatment Recommendations Therapy as outlined in treatment plan below     Prognosis 04/26/2020  Prognosis for Safe Diet Advancement Good  Barriers to Reach Goals --  Barriers/Prognosis Comment --    CHL IP DIET RECOMMENDATION 04/26/2020  SLP Diet Recommendations Dysphagia 3 (Mech soft) solids;Nectar thick liquid  Liquid Administration via Cup;Straw  Medication Administration Whole meds with liquid  Compensations Clear throat intermittently;Slow rate;Small sips/bites  Postural Changes Seated upright at 90 degrees;Remain semi-upright after after feeds/meals (Comment)      CHL IP OTHER RECOMMENDATIONS 04/26/2020  Recommended Consults --  Oral Care Recommendations Oral care QID  Other Recommendations Order thickener from pharmacy      CHL IP FOLLOW UP RECOMMENDATIONS 04/26/2020  Follow up Recommendations Other (comment)      CHL IP FREQUENCY AND DURATION 04/26/2020  Speech Therapy Frequency (ACUTE ONLY) min 1 x/week  Treatment Duration 1 week           CHL IP ORAL PHASE 04/26/2020  Oral Phase WFL  Oral - Pudding Teaspoon --  Oral - Pudding Cup --  Oral - Honey Teaspoon --  Oral - Honey Cup --  Oral - Nectar Teaspoon --  Oral - Nectar Cup --  Oral - Nectar Straw --  Oral - Thin Teaspoon --  Oral - Thin Cup --  Oral - Thin Straw --  Oral - Puree --  Oral - Mech Soft --  Oral - Regular --  Oral - Multi-Consistency --  Oral - Pill --  Oral Phase - Comment --    CHL IP PHARYNGEAL PHASE 04/26/2020  Pharyngeal Phase Impaired  Pharyngeal- Pudding Teaspoon --  Pharyngeal --  Pharyngeal- Pudding Cup --  Pharyngeal --  Pharyngeal- Honey Teaspoon --  Pharyngeal --  Pharyngeal- Honey Cup --  Pharyngeal --  Pharyngeal- Nectar Teaspoon --  Pharyngeal --  Pharyngeal- Nectar Cup Delayed swallow initiation-vallecula;Reduced anterior laryngeal mobility;Pharyngeal residue - valleculae  Pharyngeal Material does not enter airway  Pharyngeal- Nectar  Straw Delayed swallow initiation-vallecula;Reduced anterior laryngeal mobility;Pharyngeal residue - valleculae  Pharyngeal Material does not enter airway  Pharyngeal- Thin Teaspoon Delayed swallow initiation-vallecula;Reduced anterior laryngeal mobility;Reduced airway/laryngeal closure;Penetration/Apiration after swallow;Trace aspiration;Pharyngeal residue - valleculae;Pharyngeal residue - pyriform  Pharyngeal Material enters airway, CONTACTS cords and not ejected out;Material enters airway, passes BELOW cords then ejected out  Pharyngeal- Thin Cup Delayed swallow initiation-vallecula;Reduced anterior laryngeal mobility;Reduced airway/laryngeal closure;Penetration/Apiration after swallow;Pharyngeal residue - valleculae;Pharyngeal residue - pyriform  Pharyngeal Material enters airway, remains ABOVE vocal cords then ejected out;Material enters airway, CONTACTS cords and then ejected out;Material enters airway, passes BELOW cords then ejected out  Pharyngeal- Thin Straw --  Pharyngeal --  Pharyngeal- Puree Delayed swallow initiation-vallecula  Pharyngeal Material does not enter airway  Pharyngeal- Mechanical Soft Delayed swallow initiation-vallecula  Pharyngeal Material does not enter airway  Pharyngeal- Regular --  Pharyngeal --  Pharyngeal- Multi-consistency --  Pharyngeal --  Pharyngeal- Pill --  Pharyngeal --  Pharyngeal Comment --     CHL IP CERVICAL ESOPHAGEAL PHASE 04/26/2020  Cervical Esophageal Phase WFL  Pudding Teaspoon --  Pudding Cup --  Honey Teaspoon --  Honey Cup --  Nectar Teaspoon --  Nectar Cup --  Nectar Straw --  Thin Teaspoon --  Thin Cup --  Thin Straw --  Puree --  Mechanical Soft --  Regular --  Multi-consistency --  Pill --  Cervical Esophageal Comment --    Deneise Lever, MS, CCC-SLP Speech-Language Pathologist  Aliene Altes 04/26/2020, 3:39 PM

## 2020-04-26 NOTE — Progress Notes (Signed)
[place] [facility] [place] NAME: Frank Bartlett    MR#:  [id number]  DATE OF BIRTH:  1926/04/23  SUBJECTIVE:  patient came in from [facility] with abdominal pain two days ago.   Patient is out in the chair. NG tube removed. Tolerated clear liquid. Swallow eval completed. Now advance to soft diet with nectar thick. Patient ambulated in the hallways earlier.  REVIEW OF SYSTEMS:   Review of Systems  Constitutional: Negative for chills, fever and weight loss.  HENT: Positive for sore throat. Negative for ear discharge, ear pain and nosebleeds.   Eyes: Negative for blurred vision, pain and discharge.  Respiratory: Negative for sputum production, shortness of breath, wheezing and stridor.   Cardiovascular: Negative for chest pain, palpitations, orthopnea and PND.  Gastrointestinal: Positive for abdominal pain. Negative for diarrhea, nausea and vomiting.  Genitourinary: Negative for frequency and urgency.  Musculoskeletal: Negative for back pain and joint pain.  Neurological: Negative for sensory change, speech change, focal weakness and weakness.  Psychiatric/Behavioral: Negative for depression and hallucinations. The patient is not nervous/anxious.    Tolerating Diet: soft diet with nectar thick liquid  tolerating PT: ambulates in the hallway  DRUG ALLERGIES:  No Known Allergies  VITALS:  Blood pressure 127/77, pulse 70, temperature 98.4 F (36.9 C), resp. rate 16, height 5\' 9"  (1.753 m), weight 83.5 kg, SpO2 97 %.  PHYSICAL EXAMINATION:   Physical Exam  GENERAL:  86 y.o.-year-old patient lying in the bed with no acute distress.  HEENT: Head atraumatic, normocephalic. Oropharynx and nasopharynx clear.  LUNGS: Normal breath sounds bilaterally, no wheezing, rales, rhonchi. No use of accessory muscles of respiration.  CARDIOVASCULAR: S1, S2 normal. No murmurs, rubs, or gallops.  ABDOMEN: Soft, nontender, nondistended. Few Bowel sounds present.  No organomegaly or mass.  EXTREMITIES: No cyanosis, clubbing or edema b/l.    NEUROLOGIC: Cranial nerves II through XII are intact. No focal Motor or sensory deficits b/l.   PSYCHIATRIC:  patient is alert and oriented x 3.  SKIN: No obvious rash, lesion, or ulcer.   LABORATORY PANEL:  CBC Recent Labs  Lab 04/25/20 0506  WBC 7.1  HGB 11.1*  HCT 33.6*  PLT 223    Chemistries  Recent Labs  Lab 04/25/20 0506  NA 136  K 4.1  CL 107  CO2 23  GLUCOSE 94  BUN 22  CREATININE 1.32*  CALCIUM 8.3*  AST 19  ALT 13  ALKPHOS 66  BILITOT 0.9   Cardiac Enzymes No results for input(s): TROPONINI in the last 168 hours. RADIOLOGY:  DG ABD ACUTE 2+V W 1V CHEST  Result Date: 04/25/2020 CLINICAL DATA:  Follow-up ileus EXAM: DG ABDOMEN ACUTE WITH 1 VIEW CHEST COMPARISON:  04/24/2020, 04/23/2020 FINDINGS: Cardiac shadow is mildly prominent but stable. Pacing device is again seen. Gastric catheter is noted within the stomach. The lungs show minimal left basilar atelectasis. No sizable effusion is noted. No bony abnormality is seen. Scattered large and small bowel gas is noted. No obstructive changes are seen. No abnormal mass is noted. Diffuse vascular calcifications are seen. Prostate therapy seeds are noted. No acute bony abnormality is noted. Stable degenerative change of lumbar spine is seen IMPRESSION: No acute abnormality in the chest. Scattered large and small bowel gas without definitive obstructive change. Electronically Signed   By: Inez Catalina M.D.   On: 04/25/2020 08:13   DG Swallowing Func-Speech Pathology  Result Date: 04/26/2020 Objective Swallowing Evaluation: Type of Study: MBS-Modified  Barium Swallow Study  Patient Details Name: Frank Bartlett MRN: [id number] Date of Birth: July 01, 1925 Today's Date: 04/26/2020 Time: SLP Start Time (ACUTE ONLY): 1415 -SLP Stop Time (ACUTE ONLY): 1440 SLP Time Calculation (min) (ACUTE ONLY): 25 min Past Medical History: Past Medical History:  Diagnosis Date . AAA (abdominal aortic aneurysm) ([place])  . Actinic keratosis  . Aortic atherosclerosis ([place])  . Atrial fibrillation ([place]) 03/2016  brief spell . B12 deficiency  . Basal cell carcinoma 10/30/2016  L lat crown . Bradycardia  . Bradycardia 02/2017  Pacer placed . Chronic kidney disease (CKD), stage III (moderate) (HCC)  . Hyperlipidemia  . Hypertension  . Hypothyroidism  . Osteoarthritis  . Prostate cancer ([place]) 2001  Rad tx's + seed implants . Renal cancer, left ([place]) 03/2016  Left Renal Nephrectomy . Sick sinus syndrome [facility])   Pacemaker Past Surgical History: Past Surgical History: Procedure Laterality Date . CATARACT EXTRACTION, BILATERAL   . COLONOSCOPY   . EXCISIONAL HEMORRHOIDECTOMY   . INSERTION PROSTATE RADIATION SEED   . LAPAROSCOPIC NEPHRECTOMY, HAND ASSISTED Left 03/08/2016  Procedure: HAND ASSISTED LAPAROSCOPIC NEPHRECTOMY;  Surgeon: Nickie Retort, MD;  Location: [facility] ORS;  Service: Urology;  Laterality: Left; . LAPAROTOMY N/A 03/13/2016  Procedure: EXPLORATORY LAPAROTOMY;  Surgeon: Festus Aloe, MD;  Location: [facility] ORS;  Service: Urology;  Laterality: N/A; . PACEMAKER INSERTION N/A 02/20/2017  Procedure: INSERTION PACEMAKER;  Surgeon: Isaias Cowman, MD;  Location: [facility] ORS;  Service: Cardiovascular;  Laterality: N/A; HPI: Frank Bartlett is a 86 y.o. male with medical history significant for hypothyroid, hypertension, depression, ventral hernia, history of renal cell carcinoma status post left renal nephrectomy, history of AAA, history of sinoatrial node dysfunction presented to the emergency department from [facility] via EMS for chief concerns of abdominal pain. NG tube placed in the ED, removed 04/26/20. Pt was started on clear liquids, however reported new onset coughing when drinking.  Subjective: Pt reports new onset coughing with liquids Assessment / Plan / Recommendation CHL IP CLINICAL IMPRESSIONS 04/26/2020 Clinical Impression Patient presents with mild pharyngeal  dysphagia, with trace aspiration occuring with thin liquids after the swallow. Suspect normative age-related changes in swallowing are exacerbated by acute illness/decreased ability to compensate. Oral stage is characterized by adequate but slow, careful mastication, functional bolus formation and anterior to posterior transfer. Pharyngeal swallow initiation occurs at the level of the valleculae, which is within functional limits for age. Pharyngeal stage is noted for mildly reduced hyolaryngeal excursion; there is mild residue in the valleculae and pyriform sinuses with thin liquids. Residue of thin was penetrated after the swallow, more so with larger and consecutive boluses, with trace aspiration x2.  Cued cough/throat clear was effective in clearing the laryngeal vestibule. Chin tuck worsened aspiration, as pt's laryngeal vestibule opened between swallows with significant deep penetration. With nectar thick and heavier boluses, there is improved stripping of residue, and pt maintains airway closure, even with multiple swallows. Recommend mechanical soft diet and nectar thick liquids as a temporary measure as pt recovers from acute illness/is more ambulatory. Good potential for advancement to thin liquids at bedside, with small sips, throat clear as compensations. SLP Visit Diagnosis Dysphagia, pharyngeal phase (R13.13) Attention and concentration deficit following -- Frontal lobe and executive function deficit following -- Impact on safety and function Mild aspiration risk;Moderate aspiration risk   CHL IP TREATMENT RECOMMENDATION 04/26/2020 Treatment Recommendations Therapy as outlined in treatment plan below   Prognosis 04/26/2020 Prognosis for Safe Diet Advancement Good Barriers to Reach Goals --  Barriers/Prognosis Comment -- CHL IP DIET RECOMMENDATION 04/26/2020 SLP Diet Recommendations Dysphagia 3 (Mech soft) solids;Nectar thick liquid Liquid Administration via Cup;Straw Medication Administration Whole meds  with liquid Compensations Clear throat intermittently;Slow rate;Small sips/bites Postural Changes Seated upright at 90 degrees;Remain semi-upright after after feeds/meals (Comment)   CHL IP OTHER RECOMMENDATIONS 04/26/2020 Recommended Consults -- Oral Care Recommendations Oral care QID Other Recommendations Order thickener from pharmacy   CHL IP FOLLOW UP RECOMMENDATIONS 04/26/2020 Follow up Recommendations Other (comment)   CHL IP FREQUENCY AND DURATION 04/26/2020 Speech Therapy Frequency (ACUTE ONLY) min 1 x/week Treatment Duration 1 week      CHL IP ORAL PHASE 04/26/2020 Oral Phase WFL Oral - Pudding Teaspoon -- Oral - Pudding Cup -- Oral - Honey Teaspoon -- Oral - Honey Cup -- Oral - Nectar Teaspoon -- Oral - Nectar Cup -- Oral - Nectar Straw -- Oral - Thin Teaspoon -- Oral - Thin Cup -- Oral - Thin Straw -- Oral - Puree -- Oral - Mech Soft -- Oral - Regular -- Oral - Multi-Consistency -- Oral - Pill -- Oral Phase - Comment --  CHL IP PHARYNGEAL PHASE 04/26/2020 Pharyngeal Phase Impaired Pharyngeal- Pudding Teaspoon -- Pharyngeal -- Pharyngeal- Pudding Cup -- Pharyngeal -- Pharyngeal- Honey Teaspoon -- Pharyngeal -- Pharyngeal- Honey Cup -- Pharyngeal -- Pharyngeal- Nectar Teaspoon -- Pharyngeal -- Pharyngeal- Nectar Cup Delayed swallow initiation-vallecula;Reduced anterior laryngeal mobility;Pharyngeal residue - valleculae Pharyngeal Material does not enter airway Pharyngeal- Nectar Straw Delayed swallow initiation-vallecula;Reduced anterior laryngeal mobility;Pharyngeal residue - valleculae Pharyngeal Material does not enter airway Pharyngeal- Thin Teaspoon Delayed swallow initiation-vallecula;Reduced anterior laryngeal mobility;Reduced airway/laryngeal closure;Penetration/Apiration after swallow;Trace aspiration;Pharyngeal residue - valleculae;Pharyngeal residue - pyriform Pharyngeal Material enters airway, CONTACTS cords and not ejected out;Material enters airway, passes BELOW cords then ejected out  Pharyngeal- Thin Cup Delayed swallow initiation-vallecula;Reduced anterior laryngeal mobility;Reduced airway/laryngeal closure;Penetration/Apiration after swallow;Pharyngeal residue - valleculae;Pharyngeal residue - pyriform Pharyngeal Material enters airway, remains ABOVE vocal cords then ejected out;Material enters airway, CONTACTS cords and then ejected out;Material enters airway, passes BELOW cords then ejected out Pharyngeal- Thin Straw -- Pharyngeal -- Pharyngeal- Puree Delayed swallow initiation-vallecula Pharyngeal Material does not enter airway Pharyngeal- Mechanical Soft Delayed swallow initiation-vallecula Pharyngeal Material does not enter airway Pharyngeal- Regular -- Pharyngeal -- Pharyngeal- Multi-consistency -- Pharyngeal -- Pharyngeal- Pill -- Pharyngeal -- Pharyngeal Comment --  CHL IP CERVICAL ESOPHAGEAL PHASE 04/26/2020 Cervical Esophageal Phase WFL Pudding Teaspoon -- Pudding Cup -- Honey Teaspoon -- Honey Cup -- Nectar Teaspoon -- Nectar Cup -- Nectar Straw -- Thin Teaspoon -- Thin Cup -- Thin Straw -- Puree -- Mechanical Soft -- Regular -- Multi-consistency -- Pill -- Cervical Esophageal Comment -- Deneise Lever, MS, CCC-SLP Speech-Language Pathologist Aliene Altes 04/26/2020, 3:40 PM              ASSESSMENT AND PLAN:   Frank Bartlett is a 86 y.o. male with medical history significant for hypothyroid, hypertension, depression, ventral hernia, history of renal cell carcinoma status post left renal nephrectomy, history of AAA, history of sinoatrial node dysfunction presented to the emergency department from [facility] via EMS for chief concerns of abdominal pain.  Small bowel obstruction/Ileus -General surgery has been consulted, Dr. Dahlia Byes-- conservative management. -was on IVF -NG tube placed yesterday. NG output is not recorded -- NG tube removed -- daily x-rays --12/21-- patient tolerated clear liquid. Seen by speech therapy today. Will advance to soft with nectar thick  liquid. From surgery standpoint improving.  Dysphagia, cough, sore throat-- acute onset suspect  due to NG tube -- seen by speech therapy follow recommendation. --use chlorseptic spray prn  CKD 3a-stable - sCr in 2020 was 1.6 - 2.20 -Creatinine clearance is calculated as 29 -Serum creatinine in the emergency department was 1.94, GFR 32--creat down to 1.3 -d/c ivf  Hypertension-controlled -Resumed home amlodipine, lisinopril  Hypothyroid-resumed home levothyroxine 112 mcg before breakfast  Depression/anxiety-resumed home sertraline 50 mg daily  History of paroxysmal atrial fibrillation-currently not on blood thinner  Sinoatrial node dysfunction s/p dual PPM in 02/19/2017  Renal cell carcinoma status post unilateral nephrectomy left in 2016/2017  History of prostate cancer s/p radiation   DVT prophylaxis: Enoxaparin Code Status: DNR, patient does not want to be intubated in cardiac arrest, he is open to be intubated for surgical reasons--prior to admission Diet: N.p.o. Family Communication:son Edythe Clarity on the phone today Disposition Plan: likely tomorrow to [employer] called: General surgery Admission status: Inpatient     Remains inpatient appropriate because:IV treatments appropriate due to intensity of illness or inability to take PO   Dispo: The patient is from: ALF              Anticipated d/c is to: ALF              Anticipated d/c date is: 1 day             Patient currently is not medically stable to d/c.  SBO, cont conservative mnx per surgery recommendations patient developed dysphagia vessel throat secondary to NG tube placement. Speech evaluation done. Will monitor one more day to ensure patient swallowing well and watch for signs of aspiration.  TOC informed.     TOTAL TIME TAKING CARE OF THIS PATIENT: 25 minutes.  >50% time spent on counselling and coordination of care  Note: This dictation was prepared with Dragon dictation along  with smaller phrase technology. Any transcriptional errors that result from this process are unintentional.  Fritzi Mandes M.D    [facility]   CC: Primary care physician; Venia Carbon, MDPatient ID: Frank Bartlett, male   DOB: July 15, 1925, 86 y.o.   MRN: [id number]

## 2020-04-26 NOTE — Progress Notes (Signed)
[facility] SURGICAL PROGRESS NOTE (cpt [phone])  Hospital Day(s): 2.   Interval History: Patient seen and examined, no acute events or new complaints overnight. Patient reports he is feeling better however his biggest complaint is that he feels like he is coughing every time he tries to swallow liquids which is reportedly new since admission. He denies fever, chills, chest pain, SOB, abdominal pain, distension, nausea, emesis. No new labs or imaging. He passed NGT clamping trial yesterday and started on CLD. He continues to pass flatus.   Review of Systems:  Constitutional: denies fever, chills  HEENT: + cough, + trouble swallowing Respiratory: denies any shortness of breath Cardiovascular: denies chest pain or palpitations  Gastrointestinal: denies abdominal pain, N/V, or diarrhea/and bowel function as per interval history Genitourinary: denies burning with urination or urinary frequency   Vital signs in last 24 hours: [min-max] current  Temp:  [98.2 F (36.8 C)-99.3 F (37.4 C)] 98.2 F (36.8 C) (12/21 0415) Pulse Rate:  [59-105] 61 (12/21 0415) Resp:  [16-19] 19 (12/21 0415) BP: (122-152)/(65-80) 129/65 (12/21 0415) SpO2:  [92 %-97 %] 95 % (12/21 0415)     Height: 5\' 9"  (175.3 cm) Weight: 83.5 kg BMI (Calculated): 27.16   Intake/Output last 2 shifts:  12/20 0701 - 12/21 0700 In: 1589.8 [I.V.:1589.8] Out: 8592 [Urine:1550; Emesis/NG output:3]   Physical Exam:  Constitutional: alert, cooperative and no distress  HENT: normocephalic without obvious abnormality Eyes: PERRL, EOM's grossly intact and symmetric  Respiratory: breathing non-labored at rest  Cardiovascular: regular rate and sinus rhythm  Gastrointestinal: Soft, non-tender, and non-distended, no rebound/guarding. Ventral hernia in upper abdomen, soft, easily reduces Musculoskeletal: UE and LE FROM, no edema or wounds, motor and sensation grossly intact, NT    Labs:  CBC Latest Ref Rng & Units  04/25/2020 04/24/2020 04/23/2020  WBC 4.0 - 10.5 K/uL 7.1 6.9 11.5(H)  Hemoglobin 13.0 - 17.0 g/dL 11.1(L) 11.7(L) 13.0  Hematocrit 39.0 - 52.0 % 33.6(L) 34.3(L) 38.9(L)  Platelets 150 - 400 K/uL 223 225 267   CMP Latest Ref Rng & Units 04/25/2020 04/24/2020 04/23/2020  Glucose 70 - 99 mg/dL 94 120(H) 130(H)  BUN 8 - 23 mg/dL 22 33(H) 38(H)  Creatinine 0.61 - 1.24 mg/dL 1.32(H) 1.52(H) 1.94(H)  Sodium 135 - 145 mmol/L 136 134(L) 132(L)  Potassium 3.5 - 5.1 mmol/L 4.1 4.7 4.4  Chloride 98 - 111 mmol/L 107 103 98  CO2 22 - 32 mmol/L 23 22 21(L)  Calcium 8.9 - 10.3 mg/dL 8.3(L) 8.4(L) 9.6  Total Protein 6.5 - 8.1 g/dL 6.2(L) - 8.0  Total Bilirubin 0.3 - 1.2 mg/dL 0.9 - 0.7  Alkaline Phos 38 - 126 U/L 66 - 87  AST 15 - 41 U/L 19 - 22  ALT 0 - 44 U/L 13 - 16    Imaging studies: No new pertinent imaging studies   Assessment/Plan: (ICD-10's: K49.609) 86 y.o. male with clinically improvement partial SBO and reducible ventral hernia, complicated by pertinent comorbidities including advanced age and multiple comorbid conditions   - given his reports of dysphagia, recommend leaving on CLD for now and I will place evaluation for SLP to ensure he is safe to advance diet  - monitor abdominal examination; on-going bowel function   - Pain control prn (minimize narcotics as feasible); antiemetics prn             - No emergent surgical intervention at this time             - Mobilization  as tolerated; he is anxious to ambulate; okay for OOB             - Further management per primary service; we will follow for now  All of the above findings and recommendations were discussed with the patient, and the medical team, and all of patient's questions were answered to his expressed satisfaction.  -- [name], PA-C [facility] 04/26/2020, 7:04 AM [phone] M-F: 7am - 4pm

## 2020-04-26 NOTE — Progress Notes (Signed)
SLP Cancellation Note  Patient Details Name: Frank Bartlett MRN: [id number] DOB: 16-Jul-1925   Cancelled treatment:       Reason Eval/Treat Not Completed:  (chart reviewed; consulted NSG/MD) Per MD, then NSG report, pt is exhibiting increased coughing w/ thin liquids suspicious for dysphagia, aspiration. After further discussion w/ co-treating SLP, recommend an objective assessment to best assess swallow function. This will be completed this afternoon. NSG agreed and is holding his Lunch tray.   Orinda Kenner, MS, CCC-SLP Speech Language Pathologist Rehab Services [phone] [facility] 04/26/2020, 12:19 PM

## 2020-04-27 DIAGNOSIS — K436 Other and unspecified ventral hernia with obstruction, without gangrene: Secondary | ICD-10-CM

## 2020-04-27 DIAGNOSIS — K56699 Other intestinal obstruction unspecified as to partial versus complete obstruction: Secondary | ICD-10-CM

## 2020-04-27 NOTE — Progress Notes (Signed)
PROGRESS NOTE    Frank Bartlett  [id number] DOB: 09/07/1925 DOA: 04/23/2020 PCP: Venia Carbon, MD    Brief Narrative: This 85 years old male with PMH significant for hypothyroidism, hypertension, depression, ventral hernia, history of renal cell carcinoma s/p left renal nephrectomy, history of AAA, history of sinus node dysfunction s/p PPM who was sent from [place] home with abdominal pain for 2 days.  Patient is found to have small bowel obstruction.  General surgery consulted,  recommended conservative management.  Patient is slowly improving,  NG tube removed,  tolerated clear liquid diet but he developed cough after having food.  Speech and swallow evaluation completed,  recommended dysphagia 3 diet. General surgery signed off.   Assessment & Plan:   Principal Problem:   SBO (small bowel obstruction) (HCC) Active Problems:   History of renal cell cancer   CKD (chronic kidney disease) stage 3, GFR 30-59 ml/min (HCC)   Hypertension   Hypothyroidism   AAA (abdominal aortic aneurysm) (HCC)   Aortic atherosclerosis (HCC)   HLD (hyperlipidemia)   Small bowel obstruction/Ileus:  Patient presented with abdominal pain, nausea and vomiting. CT abdomen consistent with partial small bowel obstruction. General surgery consulted, Dr. Pabon-recommended conservative management. Patient placed on IV fluids.  Kept NPO. NG tube inserted and removed. Small bowel obstruction improving Patient tolerated clear liquid. Seen by speech therapy today.  Diet advanced to soft with nectar thick liquid. From surgery standpoint improving.  Dysphagia, cough, sore throat-- acute onset suspect due to NG tube seen by speech therapy , recommended dysphagia 3 diet. Continue chlorseptic spray prn  CKD 3a-stable sCr in 2020 was 1.6 - 2.20 Creatinine clearance is calculated as 29 Serum creatinine in the emergency department was 1.94, GFR 32--creat down to 1.3 -d/c  ivf  Hypertension-controlled Continue amlodipine and lisinopril.  Hypothyroidism: Continue levothyroxine  Depression/anxiety-continue Zoloft  History of paroxysmal atrial fibrillation-currently not on blood thinner  Sinoatrialnode dysfunction s/p dual PPM in10/16/2018  Renal cell carcinoma status post unilateral nephrectomy left in2016/2017  History of prostate cancer s/p radiation: Stable   DVT prophylaxis: Lovenox Code Status: DNR Family Communication: No family in the room Disposition Plan:  Status is: Inpatient  Remains inpatient appropriate because:Inpatient level of care appropriate due to severity of illness   Dispo: The patient is from: SNF              Anticipated d/c is to: SNF              Anticipated d/c date is: 1 day              Patient currently is not medically stable to d/c.    Consultants:   General surgery  Procedures: Antimicrobials:  Anti-infectives (From admission, onward)   None      Subjective: Patient was seen and examined at bedside.  Overnight events noted.  Patient has tolerated dysphagia 3 diet but still has cough after he eats food.  Denies any abdominal pain.  Objective: Vitals:   04/26/20 2025 04/27/20 0005 04/27/20 0435 04/27/20 0812  BP: 128/64 117/69 122/79 118/62  Pulse: 65 69 70 (!) 58  Resp: 17 18 17 17   Temp: 98.5 F (36.9 C) 97.9 F (36.6 C) 98.3 F (36.8 C) 98.8 F (37.1 C)  TempSrc:    Oral  SpO2: 97% 96% 95% 94%  Weight:      Height:        Intake/Output Summary (Last 24 hours) at 04/27/2020 1248 Last data  filed at 04/26/2020 1851 Gross per 24 hour  Intake 240 ml  Output --  Net 240 ml   Filed Weights   04/24/20 0726  Weight: 83.5 kg    Examination:  General exam: Appears calm and comfortable, not in any distress. Respiratory system: Clear to auscultation. Respiratory effort normal. Cardiovascular system: S1 & S2 heard, RRR. No JVD, murmurs, rubs, gallops or clicks. No pedal  edema. Gastrointestinal system: Abdomen is distended, ventral hernia noted, soft and nontender. No organomegaly or masses felt. Normal bowel sounds heard. Central nervous system: Alert and oriented. No focal neurological deficits. Extremities: No edema, no cyanosis, no clubbing. Skin: No rashes, lesions or ulcers Psychiatry: Judgement and insight appear normal. Mood & affect appropriate.     Data Reviewed: I have personally reviewed following labs and imaging studies  CBC: Recent Labs  Lab 04/23/20 2301 04/24/20 0557 04/25/20 0506  WBC 11.5* 6.9 7.1  HGB 13.0 11.7* 11.1*  HCT 38.9* 34.3* 33.6*  MCV 96.3 96.3 97.7  PLT 267 225 [id number]   Basic Metabolic Panel: Recent Labs  Lab 04/23/20 2301 04/24/20 0557 04/25/20 0506  NA 132* 134* 136  K 4.4 4.7 4.1  CL 98 103 107  CO2 21* 22 23  GLUCOSE 130* 120* 94  BUN 38* 33* 22  CREATININE 1.94* 1.52* 1.32*  CALCIUM 9.6 8.4* 8.3*   GFR: Estimated Creatinine Clearance: 35.7 mL/min (A) (by C-G formula based on SCr of 1.32 mg/dL (H)). Liver Function Tests: Recent Labs  Lab 04/23/20 2301 04/25/20 0506  AST 22 19  ALT 16 13  ALKPHOS 87 66  BILITOT 0.7 0.9  PROT 8.0 6.2*  ALBUMIN 4.4 3.3*   Recent Labs  Lab 04/23/20 2301  LIPASE 38   No results for input(s): AMMONIA in the last 168 hours. Coagulation Profile: Recent Labs  Lab 04/25/20 0506  INR 1.1   Cardiac Enzymes: No results for input(s): CKTOTAL, CKMB, CKMBINDEX, TROPONINI in the last 168 hours. BNP (last 3 results) No results for input(s): PROBNP in the last 8760 hours. HbA1C: No results for input(s): HGBA1C in the last 72 hours. CBG: No results for input(s): GLUCAP in the last 168 hours. Lipid Profile: No results for input(s): CHOL, HDL, LDLCALC, TRIG, CHOLHDL, LDLDIRECT in the last 72 hours. Thyroid Function Tests: No results for input(s): TSH, T4TOTAL, FREET4, T3FREE, THYROIDAB in the last 72 hours. Anemia Panel: No results for input(s): VITAMINB12,  FOLATE, FERRITIN, TIBC, IRON, RETICCTPCT in the last 72 hours. Sepsis Labs: Recent Labs  Lab 04/24/20 0015 04/24/20 0242  LATICACIDVEN 1.1 1.1    Recent Results (from the past 240 hour(s))  Resp Panel by RT-PCR (Flu A&B, Covid) Nasopharyngeal Swab     Status: None   Collection Time: 04/23/20 11:58 PM   Specimen: Nasopharyngeal Swab; Nasopharyngeal(NP) swabs in vial transport medium  Result Value Ref Range Status   SARS Coronavirus 2 by RT PCR NEGATIVE NEGATIVE Final    Comment: (NOTE) SARS-CoV-2 target nucleic acids are NOT DETECTED.  The SARS-CoV-2 RNA is generally detectable in upper respiratory specimens during the acute phase of infection. The lowest concentration of SARS-CoV-2 viral copies this assay can detect is 138 copies/mL. A negative result does not preclude SARS-Cov-2 infection and should not be used as the sole basis for treatment or other patient management decisions. A negative result may occur with  improper specimen collection/handling, submission of specimen other than nasopharyngeal swab, presence of viral mutation(s) within the areas targeted by this assay, and inadequate number of viral  copies(<138 copies/mL). A negative result must be combined with clinical observations, patient history, and epidemiological information. The expected result is Negative.  Fact Sheet for Patients:  EntrepreneurPulse.com.au  Fact Sheet for Healthcare Providers:  IncredibleEmployment.be  This test is no t yet approved or cleared by the [place] FDA and  has been authorized for detection and/or diagnosis of SARS-CoV-2 by FDA under an Emergency Use Authorization (EUA). This EUA will remain  in effect (meaning this test can be used) for the duration of the COVID-19 declaration under Section 564(b)(1) of the Act, 21 U.S.C.section 360bbb-3(b)(1), unless the authorization is terminated  or revoked sooner.       Influenza A by PCR  NEGATIVE NEGATIVE Final   Influenza B by PCR NEGATIVE NEGATIVE Final    Comment: (NOTE) The Xpert Xpress SARS-CoV-2/FLU/RSV plus assay is intended as an aid in the diagnosis of influenza from Nasopharyngeal swab specimens and should not be used as a sole basis for treatment. Nasal washings and aspirates are unacceptable for Xpert Xpress SARS-CoV-2/FLU/RSV testing.  Fact Sheet for Patients: EntrepreneurPulse.com.au  Fact Sheet for Healthcare Providers: IncredibleEmployment.be  This test is not yet approved or cleared by the [place] FDA and has been authorized for detection and/or diagnosis of SARS-CoV-2 by FDA under an Emergency Use Authorization (EUA). This EUA will remain in effect (meaning this test can be used) for the duration of the COVID-19 declaration under Section 564(b)(1) of the Act, 21 U.S.C. section 360bbb-3(b)(1), unless the authorization is terminated or revoked.  Performed at [facility], [address], [place], [place] [address]     Radiology Studies: DG Swallowing [facility] Pathology  Result Date: 04/26/2020 Objective Swallowing Evaluation: Type of Study: MBS-Modified Barium Swallow Study  Patient Details Name: Frank Bartlett MRN: [id number] Date of Birth: 1926-02-15 Today's Date: 04/26/2020 Time: SLP Start Time (ACUTE ONLY): 1415 -SLP Stop Time (ACUTE ONLY): 1440 SLP Time Calculation (min) (ACUTE ONLY): 25 min Past Medical History: Past Medical History: Diagnosis Date . AAA (abdominal aortic aneurysm) ([place])  . Actinic keratosis  . Aortic atherosclerosis ([place])  . Atrial fibrillation ([place]) 03/2016  brief spell . B12 deficiency  . Basal cell carcinoma 10/30/2016  L lat crown . Bradycardia  . Bradycardia 02/2017  Pacer placed . Chronic kidney disease (CKD), stage III (moderate) (HCC)  . Hyperlipidemia  . Hypertension  . Hypothyroidism  . Osteoarthritis  . Prostate cancer ([place]) 2001  Rad tx's + seed implants . Renal  cancer, left ([place]) 03/2016  Left Renal Nephrectomy . Sick sinus syndrome [facility])   Pacemaker Past Surgical History: Past Surgical History: Procedure Laterality Date . CATARACT EXTRACTION, BILATERAL   . COLONOSCOPY   . EXCISIONAL HEMORRHOIDECTOMY   . INSERTION PROSTATE RADIATION SEED   . LAPAROSCOPIC NEPHRECTOMY, HAND ASSISTED Left 03/08/2016  Procedure: HAND ASSISTED LAPAROSCOPIC NEPHRECTOMY;  Surgeon: Nickie Retort, MD;  Location: [facility] ORS;  Service: Urology;  Laterality: Left; . LAPAROTOMY N/A 03/13/2016  Procedure: EXPLORATORY LAPAROTOMY;  Surgeon: Festus Aloe, MD;  Location: [facility] ORS;  Service: Urology;  Laterality: N/A; . PACEMAKER INSERTION N/A 02/20/2017  Procedure: INSERTION PACEMAKER;  Surgeon: Isaias Cowman, MD;  Location: [facility] ORS;  Service: Cardiovascular;  Laterality: N/A; HPI: Frank Bartlett is a 86 y.o. male with medical history significant for hypothyroid, hypertension, depression, ventral hernia, history of renal cell carcinoma status post left renal nephrectomy, history of AAA, history of sinoatrial node dysfunction presented to the emergency department from [facility] via EMS for chief concerns of abdominal pain. NG  tube placed in the ED, removed 04/26/20. Pt was started on clear liquids, however reported new onset coughing when drinking.  Subjective: Pt reports new onset coughing with liquids Assessment / Plan / Recommendation CHL IP CLINICAL IMPRESSIONS 04/26/2020 Clinical Impression Patient presents with mild pharyngeal dysphagia, with trace aspiration occuring with thin liquids after the swallow. Suspect normative age-related changes in swallowing are exacerbated by acute illness/decreased ability to compensate. Oral stage is characterized by adequate but slow, careful mastication, functional bolus formation and anterior to posterior transfer. Pharyngeal swallow initiation occurs at the level of the valleculae, which is within functional limits for age. Pharyngeal stage is  noted for mildly reduced hyolaryngeal excursion; there is mild residue in the valleculae and pyriform sinuses with thin liquids. Residue of thin was penetrated after the swallow, more so with larger and consecutive boluses, with trace aspiration x2.  Cued cough/throat clear was effective in clearing the laryngeal vestibule. Chin tuck worsened aspiration, as pt's laryngeal vestibule opened between swallows with significant deep penetration. With nectar thick and heavier boluses, there is improved stripping of residue, and pt maintains airway closure, even with multiple swallows. Recommend mechanical soft diet and nectar thick liquids as a temporary measure as pt recovers from acute illness/is more ambulatory. Good potential for advancement to thin liquids at bedside, with small sips, throat clear as compensations. SLP Visit Diagnosis Dysphagia, pharyngeal phase (R13.13) Attention and concentration deficit following -- Frontal lobe and executive function deficit following -- Impact on safety and function Mild aspiration risk;Moderate aspiration risk   CHL IP TREATMENT RECOMMENDATION 04/26/2020 Treatment Recommendations Therapy as outlined in treatment plan below   Prognosis 04/26/2020 Prognosis for Safe Diet Advancement Good Barriers to Reach Goals -- Barriers/Prognosis Comment -- CHL IP DIET RECOMMENDATION 04/26/2020 SLP Diet Recommendations Dysphagia 3 (Mech soft) solids;Nectar thick liquid Liquid Administration via Cup;Straw Medication Administration Whole meds with liquid Compensations Clear throat intermittently;Slow rate;Small sips/bites Postural Changes Seated upright at 90 degrees;Remain semi-upright after after feeds/meals (Comment)   CHL IP OTHER RECOMMENDATIONS 04/26/2020 Recommended Consults -- Oral Care Recommendations Oral care QID Other Recommendations Order thickener from pharmacy   CHL IP FOLLOW UP RECOMMENDATIONS 04/26/2020 Follow up Recommendations Other (comment)   CHL IP FREQUENCY AND DURATION  04/26/2020 Speech Therapy Frequency (ACUTE ONLY) min 1 x/week Treatment Duration 1 week      CHL IP ORAL PHASE 04/26/2020 Oral Phase WFL Oral - Pudding Teaspoon -- Oral - Pudding Cup -- Oral - Honey Teaspoon -- Oral - Honey Cup -- Oral - Nectar Teaspoon -- Oral - Nectar Cup -- Oral - Nectar Straw -- Oral - Thin Teaspoon -- Oral - Thin Cup -- Oral - Thin Straw -- Oral - Puree -- Oral - Mech Soft -- Oral - Regular -- Oral - Multi-Consistency -- Oral - Pill -- Oral Phase - Comment --  CHL IP PHARYNGEAL PHASE 04/26/2020 Pharyngeal Phase Impaired Pharyngeal- Pudding Teaspoon -- Pharyngeal -- Pharyngeal- Pudding Cup -- Pharyngeal -- Pharyngeal- Honey Teaspoon -- Pharyngeal -- Pharyngeal- Honey Cup -- Pharyngeal -- Pharyngeal- Nectar Teaspoon -- Pharyngeal -- Pharyngeal- Nectar Cup Delayed swallow initiation-vallecula;Reduced anterior laryngeal mobility;Pharyngeal residue - valleculae Pharyngeal Material does not enter airway Pharyngeal- Nectar Straw Delayed swallow initiation-vallecula;Reduced anterior laryngeal mobility;Pharyngeal residue - valleculae Pharyngeal Material does not enter airway Pharyngeal- Thin Teaspoon Delayed swallow initiation-vallecula;Reduced anterior laryngeal mobility;Reduced airway/laryngeal closure;Penetration/Apiration after swallow;Trace aspiration;Pharyngeal residue - valleculae;Pharyngeal residue - pyriform Pharyngeal Material enters airway, CONTACTS cords and not ejected out;Material enters airway, passes BELOW cords then ejected out Pharyngeal- Thin Cup  Delayed swallow initiation-vallecula;Reduced anterior laryngeal mobility;Reduced airway/laryngeal closure;Penetration/Apiration after swallow;Pharyngeal residue - valleculae;Pharyngeal residue - pyriform Pharyngeal Material enters airway, remains ABOVE vocal cords then ejected out;Material enters airway, CONTACTS cords and then ejected out;Material enters airway, passes BELOW cords then ejected out Pharyngeal- Thin Straw -- Pharyngeal --  Pharyngeal- Puree Delayed swallow initiation-vallecula Pharyngeal Material does not enter airway Pharyngeal- Mechanical Soft Delayed swallow initiation-vallecula Pharyngeal Material does not enter airway Pharyngeal- Regular -- Pharyngeal -- Pharyngeal- Multi-consistency -- Pharyngeal -- Pharyngeal- Pill -- Pharyngeal -- Pharyngeal Comment --  CHL IP CERVICAL ESOPHAGEAL PHASE 04/26/2020 Cervical Esophageal Phase WFL Pudding Teaspoon -- Pudding Cup -- Honey Teaspoon -- Honey Cup -- Nectar Teaspoon -- Nectar Cup -- Nectar Straw -- Thin Teaspoon -- Thin Cup -- Thin Straw -- Puree -- Mechanical Soft -- Regular -- Multi-consistency -- Pill -- Cervical Esophageal Comment -- Deneise Lever, MS, CCC-SLP Speech-Language Pathologist [facility] 04/26/2020, 3:40 PM               Scheduled Meds: . amLODipine  10 mg Per Tube Daily  . enoxaparin (LOVENOX) injection  40 mg Subcutaneous Q24H  . feeding supplement  1 Container Oral TID BM  . levothyroxine  112 mcg Per Tube QAC breakfast  . lisinopril  20 mg Per Tube Daily  . sertraline  50 mg Per Tube Daily   Continuous Infusions:   LOS: 3 days    Time spent: 25 mins    Shawna Clamp, MD [facility]   If 7PM-7AM, please contact night-coverage

## 2020-04-27 NOTE — Progress Notes (Signed)
  Speech Language Pathology Treatment: (P) Dysphagia  Patient Details Name: Frank Bartlett MRN: [id number] DOB: 1925/10/08 Today's Date: 04/27/2020 Time: (P) 0820-(P) 0850 SLP Time Calculation (min) (ACUTE ONLY): (P) 30 min  Assessment / Plan / Recommendation Clinical Impression  Patient seen for skilled ST treatment for dysphagia. RN has not given any medications this morning, did not receive any reports of pt difficulties with dinner last night, and none documented. Vocal quality remains hoarse but somewhat improved today. Patient reported coughing less with nectar liquids; he ate chicken last night without difficulty, aside from some reported odynophagia (level 3/10 when swallowing). Today he consumed nectar thick liquids via cup and straw with wet vocal quality x1, which improved with throat clear, as well as puree and soft solids without overt signs of aspiration. Thin liquid trials with wet vocal quality or spontaneous cough in 40% of trials; when pt attempts throat clear he has prolonged coughing and expectorates thin, clear secretions. Recommend continuing with dysphagia 3, nectar thick liquids at this time. Anticipate advancement at bedside as medical status, vocal quality improve.    HPI HPI: (P) Frank GRACA is a 86 y.o. male with medical history significant for hypothyroid, hypertension, depression, ventral hernia, history of renal cell carcinoma status post left renal nephrectomy, history of AAA, history of sinoatrial node dysfunction presented to the emergency department from [facility] via EMS for chief concerns of abdominal pain. NG tube placed in the ED, removed 04/26/20. Pt was started on clear liquids, however reported new onset coughing when drinking.      SLP Plan  (P) Continue with current plan of care       Recommendations  Diet recommendations: (P) Dysphagia 3 (mechanical soft);Nectar-thick liquid Liquids provided via: (P) Cup;Straw Medication Administration:  (P) Whole meds with liquid (or whole with puree) Supervision: (P) Patient able to self feed;Intermittent supervision to cue for compensatory strategies Compensations: (P) Clear throat intermittently;Slow rate;Small sips/bites Postural Changes and/or Swallow Maneuvers: (P) Seated upright 90 degrees                Oral Care Recommendations: (P) Oral care QID Follow up Recommendations: (P) Other (comment) (ALF) SLP Visit Diagnosis: (P) Dysphagia, pharyngeal phase (R13.13) Plan: (P) Continue with current plan of care       [place], [place], [place] 04/27/2020, 8:58 AM

## 2020-04-27 NOTE — Care Management Important Message (Signed)
Important Message  Patient Details  Name: Frank Bartlett MRN: [id number] Date of Birth: [birth year]   Medicare Important Message Given:  Yes     Juliann Pulse A Sayer Masini 04/27/2020, 10:51 AM

## 2020-04-27 NOTE — Progress Notes (Signed)
[facility] SURGICAL PROGRESS NOTE (cpt [phone])  Hospital Day(s): 3.   Interval History: Patient seen and examined, no acute events or new complaints overnight. Patient reports he is feeling better, still with a cough mostly after drinking thin liquids. He denies fever, chills, nausea, emesis, abdominal pain, nor distension. No new labs or imaging. He did undergo SLP evaluation yesterday. Diet advanced to DYS3 Soft diet and is tolerating well. He continues to have bowel function.   Review of Systems:  Constitutional: denies fever, chills  HEENT: + cough (with drinking liquids)  Respiratory: denies any shortness of breath  Cardiovascular: denies chest pain or palpitations  Gastrointestinal: denies abdominal pain, N/V, or diarrhea/and bowel function as per interval history Genitourinary: denies burning with urination or urinary frequency  Vital signs in last 24 hours: [min-max] current  Temp:  [97.9 F (36.6 C)-98.7 F (37.1 C)] 98.3 F (36.8 C) (12/22 0435) Pulse Rate:  [60-70] 70 (12/22 0435) Resp:  [16-18] 17 (12/22 0435) BP: (117-157)/(64-79) 122/79 (12/22 0435) SpO2:  [95 %-97 %] 95 % (12/22 0435)     Height: 5\' 9"  (175.3 cm) Weight: 83.5 kg BMI (Calculated): 27.16   Intake/Output last 2 shifts:  12/21 0701 - 12/22 0700 In: 240 [P.O.:240] Out: 400 [Urine:400]   Physical Exam:  Constitutional: alert, cooperative and no distress  HENT: normocephalic without obvious abnormality Eyes: PERRL, EOM's grossly intact and symmetric  Respiratory: breathing non-labored at rest  Cardiovascular: regular rate and sinus rhythm  Gastrointestinal:Soft, non-tender, and non-distended, no rebound/guarding. Ventral hernia in upper abdomen, soft, easily reduces Musculoskeletal: UE and LE FROM, no edema or wounds, motor and sensation grossly intact, NT     Labs:  CBC Latest Ref Rng & Units 04/25/2020 04/24/2020 04/23/2020  WBC 4.0 - 10.5 K/uL 7.1 6.9 11.5(H)  Hemoglobin 13.0  - 17.0 g/dL 11.1(L) 11.7(L) 13.0  Hematocrit 39.0 - 52.0 % 33.6(L) 34.3(L) 38.9(L)  Platelets 150 - 400 K/uL 223 225 267   CMP Latest Ref Rng & Units 04/25/2020 04/24/2020 04/23/2020  Glucose 70 - 99 mg/dL 94 120(H) 130(H)  BUN 8 - 23 mg/dL 22 33(H) 38(H)  Creatinine 0.61 - 1.24 mg/dL 1.32(H) 1.52(H) 1.94(H)  Sodium 135 - 145 mmol/L 136 134(L) 132(L)  Potassium 3.5 - 5.1 mmol/L 4.1 4.7 4.4  Chloride 98 - 111 mmol/L 107 103 98  CO2 22 - 32 mmol/L 23 22 21(L)  Calcium 8.9 - 10.3 mg/dL 8.3(L) 8.4(L) 9.6  Total Protein 6.5 - 8.1 g/dL 6.2(L) - 8.0  Total Bilirubin 0.3 - 1.2 mg/dL 0.9 - 0.7  Alkaline Phos 38 - 126 U/L 66 - 87  AST 15 - 41 U/L 19 - 22  ALT 0 - 44 U/L 13 - 16     Imaging studies: No new pertinent imaging studies   Assessment/Plan: (ICD-10's: K76.609) 86 y.o. male with clinically resolved partial SBO and reducible ventral hernia, complicated by pertinent comorbidities includingadvanced age and multiple comorbid conditions   - Okay for DYS 3 Soft diet; appreciate SLP evaluation   - monitor abdominal examination; on-going bowel function              - Pain control prn (minimize narcotics as feasible); antiemetics prn - No emergent surgical intervention at this time - Mobilization as tolerated; he is anxious to ambulate; okay for OOB   - Further management per primary service    - Okay for discharge from surgical standpoint; we will sign off but be available if needed, he can follow up  with PCP   All of the above findings and recommendations were discussed with the patient, and the medical team, and all of patient's questions were answered to his expressed satisfaction.  -- [name], PA-C [facility] 04/27/2020, 7:12 AM [phone] M-F: 7am - 4pm

## 2020-04-28 LAB — CBC
HCT: 31.5 % — ABNORMAL LOW (ref 39.0–52.0)
Hemoglobin: 10.7 g/dL — ABNORMAL LOW (ref 13.0–17.0)
MCH: 32.8 pg (ref 26.0–34.0)
MCHC: 34 g/dL (ref 30.0–36.0)
MCV: 96.6 fL (ref 80.0–100.0)
Platelets: 217 10*3/uL (ref 150–400)
RBC: 3.26 MIL/uL — ABNORMAL LOW (ref 4.22–5.81)
RDW: 13 % (ref 11.5–15.5)
WBC: 6.2 10*3/uL (ref 4.0–10.5)
nRBC: 0 % (ref 0.0–0.2)

## 2020-04-28 LAB — URINALYSIS, COMPLETE (UACMP) WITH MICROSCOPIC
Bilirubin Urine: NEGATIVE
Glucose, UA: NEGATIVE mg/dL
Hgb urine dipstick: NEGATIVE
Ketones, ur: NEGATIVE mg/dL
Leukocytes,Ua: NEGATIVE
Nitrite: NEGATIVE
Protein, ur: 30 mg/dL — AB
Specific Gravity, Urine: 1.021 (ref 1.005–1.030)
pH: 7 (ref 5.0–8.0)

## 2020-04-28 LAB — BASIC METABOLIC PANEL
Anion gap: 8 (ref 5–15)
BUN: 24 mg/dL — ABNORMAL HIGH (ref 8–23)
CO2: 22 mmol/L (ref 22–32)
Calcium: 8.2 mg/dL — ABNORMAL LOW (ref 8.9–10.3)
Chloride: 105 mmol/L (ref 98–111)
Creatinine, Ser: 1.41 mg/dL — ABNORMAL HIGH (ref 0.61–1.24)
GFR, Estimated: 47 mL/min — ABNORMAL LOW (ref >60)
Glucose, Bld: 115 mg/dL — ABNORMAL HIGH (ref 70–99)
Potassium: 4.3 mmol/L (ref 3.5–5.1)
Sodium: 135 mmol/L (ref 135–145)

## 2020-04-28 LAB — RESP PANEL BY RT-PCR (FLU A&B, COVID) ARPGX2
Influenza A by PCR: NEGATIVE
Influenza B by PCR: NEGATIVE
SARS Coronavirus 2 by RT PCR: NEGATIVE

## 2020-04-28 MED ORDER — PHENOL 1.4 % MT LIQD: 1.0000 | OROMUCOSAL | 0 refills | Status: DC | PRN

## 2020-04-28 NOTE — TOC Progression Note (Addendum)
Transition of Care [facility]) - Progression Note    Patient Details  Name: Frank Bartlett MRN: [id number] Date of Birth: April 27, 1926  [facility] CM/SW Pena, LCSW Phone Number: 04/28/2020, 9:20 AM  Clinical Narrative:   CSW spoke with MD who reported patient may be ready to DC today. Called Frank Bartlett at [facility]. She reported patient may need to come to their SNF side first depending on his mobility. CSW spoke with RN who reported patient is needing assistance with mobility as well as a RW. Spoke with MD who reported patient would likely benefit from SNF rehab. Updated Frank Bartlett. Frank Bartlett reported she will need DC Summary with orders prior to 12:00 today for patient to be able to DC today, Friday, or Saturday due to the holiday (the facility needs to order his medications by today). MD will submit DC Summary so meds can be prepared. After PT eval, CSW will start insurance authorization if SNF is recommended. Patient will not be able to DC to SNF until insurance Josem Kaufmann is obtained if SNF is needed. CSW will continue to follow. CSW updated patient's son Frank Bartlett via phone. Updated patient at bedside.  11:00- Spoke to [employer] who said patient is close to baseline and confirmed he could walk to dining room independently with RW, bathe and dress himself. PT said patient can return to ALF with Home Health PT, does not need SNF. CSW updated RN and MD. CSW called and spoke with Frank Bartlett at [facility]. She confirmed patient can come back to ALF today based on these reports. She reported [facility] transport can pick patient up at 2:00, they will call to 1C when they are here. CSW faxing DC Summary and FL2. Updated patient's son Frank Bartlett and patient at bedside.    Expected Discharge Plan: Assisted Living Barriers to Discharge: Continued Medical Work up  Expected Discharge Plan and Services Expected Discharge Plan: Assisted Living   Discharge Planning Services: CM Consult   Living  arrangements for the past 2 months: [place]                 DME Arranged: N/A DME Agency: NA                   Social Determinants of Health (SDOH) Interventions    Readmission Risk Interventions No flowsheet data found.

## 2020-04-28 NOTE — Discharge Instructions (Signed)
Abdominal Pain, Adult Many things can cause belly (abdominal) pain. Most times, belly pain is not dangerous. Many cases of belly pain can be watched and treated at home. Sometimes, though, belly pain is serious. Your doctor will try to find the cause of your belly pain. Follow these instructions at home:  Medicines  Take over-the-counter and prescription medicines only as told by your doctor.  Do not take medicines that help you poop (laxatives) unless told by your doctor. General instructions  Watch your belly pain for any changes.  Drink enough fluid to keep your pee (urine) pale yellow.  Keep all follow-up visits as told by your doctor. This is important. Contact a doctor if:  Your belly pain changes or gets worse.  You are not hungry, or you lose weight without trying.  You are having trouble pooping (constipated) or have watery poop (diarrhea) for more than 2-3 days.  You have pain when you pee or poop.  Your belly pain wakes you up at night.  Your pain gets worse with meals, after eating, or with certain foods.  You are vomiting and cannot keep anything down.  You have a fever.  You have blood in your pee. Get help right away if:  Your pain does not go away as soon as your doctor says it should.  You cannot stop vomiting.  Your pain is only in areas of your belly, such as the right side or the left lower part of the belly.  You have bloody or black poop, or poop that looks like tar.  You have very bad pain, cramping, or bloating in your belly.  You have signs of not having enough fluid or water in your body (dehydration), such as: ? Dark pee, very little pee, or no pee. ? Cracked lips. ? Dry mouth. ? Sunken eyes. ? Sleepiness. ? Weakness.  You have trouble breathing or chest pain. Summary  Many cases of belly pain can be watched and treated at home.  Watch your belly pain for any changes.  Take over-the-counter and prescription medicines only as  told by your doctor.  Contact a doctor if your belly pain changes or gets worse.  Get help right away if you have very bad pain, cramping, or bloating in your belly. This information is not intended to replace advice given to you by your health care provider. Make sure you discuss any questions you have with your health care provider. Document Revised: 09/01/2018 Document Reviewed: 09/01/2018 Elsevier Patient Education  [place].   Acute Kidney Injury, Adult  Acute kidney injury is a sudden worsening of kidney function. The kidneys are organs that have several jobs. They filter the blood to remove waste products and extra fluid. They also maintain a healthy balance of minerals and hormones in the body, which helps control blood pressure and keep bones strong. With this condition, your kidneys do not do their jobs as well as they should. This condition ranges from mild to severe. Over time it may develop into long-lasting (chronic) kidney disease. Early detection and treatment may prevent acute kidney injury from developing into a chronic condition. What are the causes? Common causes of this condition include:  A problem with blood flow to the kidneys. This may be caused by: ? Low blood pressure (hypotension) or shock. ? Blood loss. ? Heart and blood vessel (cardiovascular) disease. ? Severe burns. ? Liver disease.  Direct damage to the kidneys. This may be caused by: ? Certain medicines. ?  A kidney infection. ? Poisoning. ? Being around or in contact with toxic substances. ? A surgical wound. ? A hard, direct hit to the kidney area.  A sudden blockage of urine flow. This may be caused by: ? Cancer. ? Kidney stones. ? An enlarged prostate in males. What are the signs or symptoms? Symptoms of this condition may not be obvious until the condition becomes severe. Symptoms of this condition can include:  Tiredness (lethargy), or difficulty staying awake.  Nausea or  vomiting.  Swelling (edema) of the face, legs, ankles, or feet.  Problems with urination, such as: ? Abdominal pain, or pain along the side of your stomach (flank). ? Decreased urine production. ? Decrease in the force of urine flow.  Muscle twitches and cramps, especially in the legs.  Confusion or trouble concentrating.  Loss of appetite.  Fever. How is this diagnosed? This condition may be diagnosed with tests, including:  Blood tests.  Urine tests.  Imaging tests.  A test in which a sample of tissue is removed from the kidneys to be examined under a microscope (kidney biopsy). How is this treated? Treatment for this condition depends on the cause and how severe the condition is. In mild cases, treatment may not be needed. The kidneys may heal on their own. In more severe cases, treatment will involve:  Treating the cause of the kidney injury. This may involve changing any medicines you are taking or adjusting your dosage.  Fluids. You may need specialized IV fluids to balance your body's needs.  Having a catheter placed to drain urine and prevent blockages.  Preventing problems from occurring. This may mean avoiding certain medicines or procedures that can cause further injury to the kidneys. In some cases treatment may also require:  A procedure to remove toxic wastes from the body (dialysis or continuous renal replacement therapy - CRRT).  Surgery. This may be done to repair a torn kidney, or to remove the blockage from the urinary system. Follow these instructions at home: Medicines  Take over-the-counter and prescription medicines only as told by your health care provider.  Do not take any new medicines without your health care provider's approval. Many medicines can worsen your kidney damage.  Do not take any vitamin and mineral supplements without your health care provider's approval. Many nutritional supplements can worsen your kidney  damage. Lifestyle  If your health care provider prescribed changes to your diet, follow them. You may need to decrease the amount of protein you eat.  Achieve and maintain a healthy weight. If you need help with this, ask your health care provider.  Start or continue an exercise plan. Try to exercise at least 30 minutes a day, 5 days a week.  Do not use any tobacco products, such as cigarettes, chewing tobacco, and e-cigarettes. If you need help quitting, ask your health care provider. General instructions  Keep track of your blood pressure. Report changes in your blood pressure as told by your health care provider.  Stay up to date with immunizations. Ask your health care provider which immunizations you need.  Keep all follow-up visits as told by your health care provider. This is important. Where to find more information  American Association of Kidney Patients: BombTimer.gl  National Kidney Foundation: www.kidney.[place]: https://mathis.com/  [facility]: ? www.lifeoptions.org ? www.kidneyschool.org Contact a health care provider if:  Your symptoms get worse.  You develop new symptoms. Get help right away if:  You develop  symptoms of worsening kidney disease, which include: ? Headaches. ? Abnormally dark or light skin. ? Easy bruising. ? Frequent hiccups. ? Chest pain. ? Shortness of breath. ? End of menstruation in women. ? Seizures. ? Confusion or altered mental status. ? Abdominal or back pain. ? Itchiness.  You have a fever.  Your body is producing less urine.  You have pain or bleeding when you urinate. Summary  Acute kidney injury is a sudden worsening of kidney function.  Acute kidney injury can be caused by problems with blood flow to the kidneys, direct damage to the kidneys, and sudden blockage of urine flow.  Symptoms of this condition may not be obvious until it becomes severe. Symptoms may include edema,  lethargy, confusion, nausea or vomiting, and problems passing urine.  This condition can usually be diagnosed with blood tests, urine tests, and imaging tests. Sometimes a kidney biopsy is done to diagnose this condition.  Treatment for this condition often involves treating the underlying cause. It is treated with fluids, medicines, dialysis, diet changes, or surgery. This information is not intended to replace advice given to you by your health care provider. Make sure you discuss any questions you have with your health care provider. Document Revised: 04/05/2017 Document Reviewed: 04/13/2016 Elsevier Patient Education  [place].   Bowel Obstruction A bowel obstruction is a blockage in the small or large bowel. The bowel, which is also called the intestine, is a long, slender tube that connects the stomach to the anus. When a person eats and drinks, food and fluids go from the mouth to the stomach to the small bowel. This is where most of the nutrients in the food and fluids are absorbed. After the small bowel, material passes through the large bowel for further absorption until any leftover material leaves the body as stool through the anus during a bowel movement. A bowel obstruction will prevent food and fluids from passing through the bowel as they normally do during digestion. The bowel can become partially or completely blocked. If this condition is not treated, it can be dangerous because the bowel could rupture. What are the causes? Common causes of this condition include:  Scar tissue (adhesions) from previous surgery or treatment with high-energy X-rays (radiation).  Recent surgery. This may cause the movements of the bowel to slow down and cause food to block the intestine.  Inflammatory bowel disease, such as Crohn's disease or diverticulitis.  Growths or tumors.  A bulging organ (hernia).  Twisting of the bowel (volvulus).  A foreign body.  Slipping of a part of  the bowel into another part (intussusception). What are the signs or symptoms? Symptoms of this condition include:  Pain in the abdomen. Depending on the degree of obstruction, pain may be: ? Mild or severe. ? Dull cramping or sharp pain. ? In one area or in the entire abdomen.  Nausea and vomiting. Vomit may be greenish or a yellow bile color.  Bloating in the abdomen.  Difficulty passing stool (constipation).  Lack of passing gas.  Frequent belching.  Diarrhea. This may occur if the obstruction is partial and runny stool is able to leak around the obstruction. How is this diagnosed? This condition may be diagnosed based on:  A physical exam.  Medical history.  Imaging tests of the abdomen or pelvis, such as X-ray or CT scan.  Blood or urine tests. How is this treated? Treatment for this condition depends on the cause and severity of the problem.  Treatment may include:  Fluids and pain medicines that are given through an IV. Your health care provider may instruct you not to eat or drink if you have nausea or vomiting.  Eating a simple diet. You may be asked to consume a clear liquid diet for several days. This allows the bowel to rest.  Placement of a small tube (nasogastric tube) into the stomach. This will relieve pain, discomfort, and nausea by removing blocked air and fluids from the stomach. It can also help the obstruction clear up faster.  Surgery. This may be required if other treatments do not work. Surgery may be required for: ? Bowel obstruction from a hernia. This can be an emergency procedure. ? Scar tissue that causes frequent or severe obstructions. Follow these instructions at home: Medicines  Take over-the-counter and prescription medicines only as told by your health care provider.  If you were prescribed an antibiotic medicine, take it as told by your health care provider. Do not stop taking the antibiotic even if you start to feel better. General  instructions  Follow instructions from your health care provider about eating restrictions. You may need to avoid solid foods and consume only clear liquids until your condition improves.  Return to your normal activities as told by your health care provider. Ask your health care provider what activities are safe for you.  Avoid sitting for a long time without moving. Get up to take short walks every 1-2 hours. This is important to improve blood flow and breathing. Ask for help if you feel weak or unsteady.  Keep all follow-up visits as told by your health care provider. This is important. How is this prevented? After having a bowel obstruction, you are more likely to have another. You may do the following things to prevent another obstruction:  If you have a long-term (chronic) disease, pay attention to your symptoms and contact your health care provider if you have questions or concerns.  Avoid becoming constipated. To prevent or treat constipation, your health care provider may recommend that you: ? Drink enough fluid to keep your urine pale yellow. ? Take over-the-counter or prescription medicines. ? Eat foods that are high in fiber, such as beans, whole grains, and fresh fruits and vegetables. ? Limit foods that are high in fat and processed sugars, such as fried or sweet foods.  Stay active. Exercise for 30 minutes or more, 5 or more days each week. Ask your health care provider which exercises are safe for you.  Avoid stress. Find ways to reduce stress, such as meditation, exercise, or taking time for activities that relax you.  Instead of eating three large meals each day, eat three small meals with three small snacks.  Work with a Microbiologist to make a healthy meal plan that works for you.  Do not use any products that contain nicotine or tobacco, such as cigarettes and e-cigarettes. If you need help quitting, ask your health care provider. Contact a health care provider if  you:  Have a fever.  Have chills. Get help right away if you:  Have increased pain or cramping.  Vomit blood.  Have uncontrolled vomiting or nausea.  Cannot drink fluids because of vomiting or pain.  Become confused.  Begin feeling very thirsty (dehydrated).  Have severe bloating.  Feel extremely weak or you faint. Summary  A bowel obstruction is a blockage in the small or large bowel.  A bowel obstruction will prevent food and fluids from passing through the  bowel as they normally do during digestion.  Treatment for this condition depends on the cause and severity of the problem. It may include fluids and pain medicines through an IV, a simple diet, a nasogastric tube, or surgery.  Follow instructions from your health care provider about eating restrictions. You may need to avoid solid foods and consume only clear liquids until your condition improves. This information is not intended to replace advice given to you by your health care provider. Make sure you discuss any questions you have with your health care provider. Document Revised: 05/30/2018 Document Reviewed: 09/04/2017 Elsevier Patient Education  [place].   Abdominal Pain, Adult Pain in the abdomen (abdominal pain) can be caused by many things. Often, abdominal pain is not serious and it gets better with no treatment or by being treated at home. However, sometimes abdominal pain is serious. Your health care provider will ask questions about your medical history and do a physical exam to try to determine the cause of your abdominal pain. Follow these instructions at home:  Medicines  Take over-the-counter and prescription medicines only as told by your health care provider.  Do not take a laxative unless told by your health care provider. General instructions  Watch your condition for any changes.  Drink enough fluid to keep your urine pale yellow.  Keep all follow-up visits as told by your  health care provider. This is important. Contact a health care provider if:  Your abdominal pain changes or gets worse.  You are not hungry or you lose weight without trying.  You are constipated or have diarrhea for more than 2-3 days.  You have pain when you urinate or have a bowel movement.  Your abdominal pain wakes you up at night.  Your pain gets worse with meals, after eating, or with certain foods.  You are vomiting and cannot keep anything down.  You have a fever.  You have blood in your urine. Get help right away if:  Your pain does not go away as soon as your health care provider told you to expect.  You cannot stop vomiting.  Your pain is only in areas of the abdomen, such as the right side or the left lower portion of the abdomen. Pain on the right side could be caused by appendicitis.  You have bloody or black stools, or stools that look like tar.  You have severe pain, cramping, or bloating in your abdomen.  You have signs of dehydration, such as: ? Dark urine, very little urine, or no urine. ? Cracked lips. ? Dry mouth. ? Sunken eyes. ? Sleepiness. ? Weakness.  You have trouble breathing or chest pain. Summary  Often, abdominal pain is not serious and it gets better with no treatment or by being treated at home. However, sometimes abdominal pain is serious.  Watch your condition for any changes.  Take over-the-counter and prescription medicines only as told by your health care provider.  Contact a health care provider if your abdominal pain changes or gets worse.  Get help right away if you have severe pain, cramping, or bloating in your abdomen. This information is not intended to replace advice given to you by your health care provider. Make sure you discuss any questions you have with your health care provider. Document Revised: 09/01/2018 Document Reviewed: 09/01/2018 Elsevier Patient Education  [place].   Bowel Obstruction A  bowel obstruction means that something is blocking the small or large bowel. The bowel  is also called the intestine. It is the long tube that connects the stomach to the opening of the butt (anus). When something blocks the bowel, food and fluids cannot pass through like normal. This condition needs to be treated. Treatment depends on the cause of the problem and how bad the problem is. What are the causes? Common causes of this condition include:  Scar tissue (adhesions) from past surgery or from high-energy X-rays (radiation).  Recent surgery in the belly. This affects how food moves in the bowel.  Some diseases, such as: ? Irritation of the lining of the digestive tract (Crohn's disease). ? Irritation of small pouches in the bowel (diverticulitis).  Growths or tumors.  A bulging organ (hernia).  Twisting of the bowel (volvulus).  A foreign body.  Slipping of a part of the bowel into another part (intussusception). What are the signs or symptoms? Symptoms of this condition include:  Pain in the belly.  Feeling sick to your stomach (nauseous).  Throwing up (vomiting).  Bloating in the belly.  Being unable to pass gas.  Trouble pooping (constipation).  Watery poop (diarrhea).  A lot of belching. How is this diagnosed? This condition may be diagnosed based on:  A physical exam.  Medical history.  Imaging tests, such as X-ray or CT scan.  Blood tests.  Urine tests. How is this treated? Treatment for this condition may include:  Fluids and pain medicines that are given through an IV tube. Your doctor may tell you not to eat or drink if you feel sick to your stomach and are throwing up.  Eating a clear liquid diet for a few days.  Putting a small tube (nasogastric tube) into the stomach. This will help with pain, discomfort, and nausea by removing blocked air and fluids from the stomach.  Surgery. This may be needed if other treatments do not work. Follow  these instructions at home: Medicines  Take over-the-counter and prescription medicines only as told by your doctor.  If you were prescribed an antibiotic medicine, take it as told by your doctor. Do not stop taking the antibiotic even if you start to feel better. General instructions  Follow your diet as told by your doctor. You may need to: ? Only drink clear liquids until you start to get better. ? Avoid solid foods.  Return to your normal activities as told by your doctor. Ask your doctor what activities are safe for you.  Do not sit for a long time without moving. Get up to take short walks every 1-2 hours. This is important. Ask for help if you feel weak or unsteady.  Keep all follow-up visits as told by your doctor. This is important. How is this prevented? After having a bowel obstruction, you may be more likely to have another. You can do some things to stop it from happening again.  If you have a long-term (chronic) disease, contact your doctor if you see changes or problems.  Take steps to prevent or treat trouble pooping. Your doctor may ask that you: ? Drink enough fluid to keep your pee (urine) pale yellow. ? Take over-the-counter or prescription medicines. ? Eat foods that are high in fiber. These include beans, whole grains, and fresh fruits and vegetables. ? Limit foods that are high in fat and sugar. These include fried or sweet foods.  Stay active. Ask your doctor which exercises are safe for you.  Avoid stress.  Eat three small meals and three small snacks each day.  Work with a Publishing rights manager (dietitian) to make a meal plan that works for you.  Do not use any products that contain nicotine or tobacco, such as cigarettes and e-cigarettes. If you need help quitting, ask your doctor. Contact a doctor if:  You have a fever.  You have chills. Get help right away if:  You have pain or cramps that get worse.  You throw up blood.  You are sick to your  stomach.  You cannot stop throwing up.  You cannot drink fluids.  You feel mixed up (confused).  You feel very thirsty (dehydrated).  Your belly gets more bloated.  You feel weak or you pass out (faint). Summary  A bowel obstruction means that something is blocking the small or large bowel.  Treatment may include IV fluids and pain medicine. You may also have a clear liquid diet, a small tube in your stomach, or surgery.  Drink clear liquids and avoid solid foods until you get better. This information is not intended to replace advice given to you by your health care provider. Make sure you discuss any questions you have with your health care provider. Document Revised: 09/04/2017 Document Reviewed: 09/04/2017 Elsevier Patient Education  [place].   Contrast-Induced Nephropathy Contrast-induced nephropathy (CIN) is a rare type of kidney damage caused by the dye that may be used in certain imaging tests. This dye is called a contrast medium. For these tests, the dye may be injected into a person's body to make tissue or blood vessels show up more clearly. A contrast medium may be used in imaging tests such as:  CT scans.  MRIs.  Angiograms. With CIN, the contrast medium may damage kidney cells directly. It may also cause a reaction in the kidneys that decreases blood supply and oxygen to the kidneys. Lack of blood and oxygen causes temporary kidney damage that usually gets better in about 2 weeks. Very rarely, severe damage may require the kidneys to be filtered by a machine (dialysis). What are the causes? The exact cause of CIN is not known. In some cases, there may be more than one cause. What increases the risk? The following factors may make you more likely to develop this condition:  Having kidney disease before you are exposed to contrast media. This is the main risk factor.  Having other conditions, such as: ? Dehydration. ? Diabetes. ? High blood  pressure. ? Low blood pressure. ? Heart disease. ? Anemia. ? A type of blood cancer that affects the kidneys (multiple myeloma).  Older age.  Needing an emergency exam that requires the use of contrast media.  Receiving a high dose of contrast media or receiving contrast media within 72 hours of the previous time you got it.  Having contrast media injected into a blood vessel that carries blood away from the heart (artery).  Being on a medicine that affects kidney function. These include NSAIDs, some antibiotics, and some cancer drugs. What are the signs or symptoms? Symptoms of this condition often develop 2-3 days after getting contrast media. Symptoms may include:  Feeling tired (fatigue).  Appetite loss.  Swelling of the feet or ankles.  Puffy, swollen eyes.  Dry, itchy skin. In some cases, there are no symptoms. How is this diagnosed? This condition may be diagnosed based on:  Your symptoms and medical history. Your health care provider may suspect CIN if you recently had an imaging test or procedure that included the use of contrast media.  A physical  exam.  Blood and urine tests to check for kidney damage. How is this treated? There is no specific treatment for CIN. If you develop CIN, you may be given fluids through an IV that is inserted into one of your veins. This will help keep your kidneys active. Electrolytes may be added to the fluid if you have an electrolyte imbalance. In most cases, CIN will get better in about 2 weeks. In rare cases, you may need kidney dialysis if kidney failure occurs and urine flow decreases. Even if you need dialysis, the condition usually improves over time. Follow these instructions at home:   Take over-the-counter and prescription medicines only as told by your health care provider. Over-the-counter NSAIDs, such as aspirin or ibuprofen, can increase your risk of CIN.  Return to your normal activities as told by your health care  provider. Ask your health care provider what activities are safe for you.  Drink enough fluid to keep your urine pale yellow.  Always let a health care provider know that you have a history of CIN before having any test or procedure that may include contrast material.  Keep all follow-up visits as told by your health care provider. This is important. How is this prevented? Take the following steps to help reduce your risk for developing CIN:  Tell your health care provider about any risk factors you have for CIN.  Ask if a contrast medium is necessary for imaging tests or procedures you are scheduled to have. Ask if there is an alternative to the test.  Make sure you drink plenty of fluids prior to any imaging tests or procedures that require contrast medium.  If possible, do not undergo multiple imaging tests or procedures that require contrast medium unless enough time has passed between tests. Contact a health care provider if:  You have any signs or symptoms of CIN.  You are urinating less than usual. Summary  Contrast-induced nephropathy (CIN) is a rare type of kidney damage caused by contrast medium.  The exact cause of CIN is not known. In some cases, there may be more than one cause.  Sometimes CIN does not cause any symptoms. If you do have symptoms, they may start 2-3 days after getting contrast medium.  To confirm the diagnosis, you may have blood tests and urine tests to check for kidney damage.  There is no specific treatment for CIN. In most cases, this condition will usually get better in about 2 weeks. You may be given IV fluids with electrolytes. In rare cases, dialysis is needed. This information is not intended to replace advice given to you by your health care provider. Make sure you discuss any questions you have with your health care provider. Document Revised: 08/05/2017 Document Reviewed: 08/05/2017 [employer] Patient Education  [place] to  follow-up with primary care physician in 1 week.   Advised to continue current medication as prescribed. Patient was admitted for small bowel obstruction which was managed conservatively. Patient was advised dysphagia 3 diet for moderate risk of aspiration.

## 2020-04-28 NOTE — Discharge Summary (Addendum)
Physician Discharge Summary  Frank Bartlett [id number] DOB: 1926-04-21 DOA: 04/23/2020  PCP: Venia Carbon, MD  Admit date: 04/23/2020   Discharge date: 04/28/2020  Admitted From: Assisted living Disposition:  Assisted living facility  Recommendations for Outpatient Follow-up:  1. Follow up with PCP in 1-2 weeks 2. Please obtain BMP/CBC in one week 3.   Advised to continue current medication as prescribed. 4.   Patient was admitted for small bowel obstruction which was managed conservatively. 5.   Patient was advised dysphagia 3 diet for moderate risk of aspiration.  Home Health: None. Equipment/Devices: None.  Discharge Condition: Stable CODE STATUS:DNR Diet recommendation: Dysphagia 3 thick liquids.  Brief Summary / Hospital course: This 86 years old male with PMH significant for hypothyroidism, hypertension, depression, ventral hernia, history of renal cell carcinoma s/p left renal nephrectomy, history of AAA, history of sinus node dysfunction s/p PPM who was sent from [place] home with abdominal pain associated with nausea and vomiting for 2 days.  Patient is found to have small bowel obstruction.  General surgery consulted,  recommended conservative management.  Patient was kept  NPO. NG tube was inserted and was given IV fluids and pain medications.  Patient  slowly improved,  NG tube was removed,  tolerated clear liquid diet but he developed cough after having food.  Speech and swallow evaluation completed,  recommended dysphagia 3 diet. General surgery signed off.  Patient's small bowel obstruction has resolved.  Patient has had a bowel movement.  Patient is being discharged back to Assisted living facility He was managed for below problems.    Discharge Diagnoses:  Principal Problem:   SBO (small bowel obstruction) (HCC) Active Problems:   History of renal cell cancer   CKD (chronic kidney disease) stage 3, GFR 30-59 ml/min (HCC)   Hypertension    Hypothyroidism   AAA (abdominal aortic aneurysm) (HCC)   Aortic atherosclerosis (HCC)   HLD (hyperlipidemia)  Small bowel obstruction/Ileus:  Patient presented with abdominal pain, nausea and vomiting. CT abdomen consistent with partial small bowel obstruction. General surgery consulted, Dr. Pabon-recommended conservative management. Patient placed on IV fluids.  Kept NPO. NG tube inserted and removed. Small bowel obstruction improving Patient tolerated clear liquid. Seen by speech therapy .  Diet advanced to soft with nectar thick liquid. From surgery standpoint improving. Small bowel obstruction resolved.  Patient has had a bowel movement.  Dysphagia, cough, sore throat--acute onset suspect due to NG tube seen by speech therapy , recommended dysphagia 3 diet. Continue chlorseptic spray prn  CKD 3a-stable. sCr in 2020 was 1.6 - 2.20 Creatinine clearance is calculated as 29 Serum creatinine in the emergency department was 1.94, GFR 32--creat down to 1.3 DC fluids.  CKD stable.  Hypertension-controlled Continue amlodipine and lisinopril.  Hypothyroidism: Continue levothyroxine  Depression/anxiety-continue Zoloft  History of paroxysmal atrial fibrillation-currently not on blood thinners  Sinoatrialnode dysfunction s/p dual PPM in10/16/2018  Renal cell carcinoma status post unilateral nephrectomy left in2016/2017.  History of prostate cancer s/p radiation: Stable.   Discharge Instructions  Discharge Instructions    Call MD for:  difficulty breathing, headache or visual disturbances   Complete by: As directed    Call MD for:  persistant dizziness or light-headedness   Complete by: As directed    Call MD for:  persistant nausea and vomiting   Complete by: As directed    Call MD for:  severe uncontrolled pain   Complete by: As directed    Diet - low sodium  heart healthy   Complete by: As directed    Diet clear liquid   Complete by: As directed     Discharge instructions   Complete by: As directed    Advised to follow-up with primary care physician in 1 week.   Advised to continue current medication is prescribed. Patient was admitted for small bowel obstruction which was managed conservatively. Patient was advised dysphagia 3 diet for moderate risk of aspiration.   Increase activity slowly   Complete by: As directed      Allergies as of 04/28/2020   No Known Allergies     Medication List    TAKE these medications   acetaminophen 500 MG tablet Commonly known as: TYLENOL Take 1,000 mg by mouth daily. And 1000mg  twice a day prn   amLODipine 10 MG tablet Commonly known as: NORVASC Take 1 tablet (10 mg total) by mouth daily.   aspirin EC 81 MG tablet Take 81 mg by mouth daily.   Cyanocobalamin 1000 MCG Tbcr Take 1,000 mcg by mouth daily.   docusate sodium 100 MG capsule Commonly known as: COLACE Take 100 mg by mouth daily.   levothyroxine 112 MCG tablet Commonly known as: SYNTHROID Take 112 mcg by mouth daily before breakfast.   lisinopril 20 MG tablet Commonly known as: ZESTRIL Take 20 mg by mouth daily.   Multi-Vitamins Tabs Take 1 tablet by mouth daily.   phenol 1.4 % Liqd Commonly known as: CHLORASEPTIC Use as directed 1 spray in the mouth or throat as needed for throat irritation / pain.   Procto-Med HC 2.5 % rectal cream Generic drug: hydrocortisone Apply 1 application topically as directed.   sertraline 50 MG tablet Commonly known as: ZOLOFT Take 50 mg by mouth daily.       Follow-up Information    Viviana Simpler I, MD Follow up in 1 week(s).   Specialties: Internal Medicine, Pediatrics Contact information: [place] [place] [address] [phone]              No Known Allergies  Consultations:  General surgery   Procedures/Studies: DG Abdomen 1 View  Result Date: 04/24/2020 CLINICAL DATA:  Evaluate NG tube EXAM: ABDOMEN - 1 VIEW COMPARISON:  None.  FINDINGS: The NG tube terminates in the stomach. IMPRESSION: NG tube terminates in the stomach. Electronically Signed   By: Dorise Bullion III M.D   On: 04/24/2020 14:57   DG Chest Port 1 View  Result Date: 04/23/2020 CLINICAL DATA:  Lower abdominal pain. EXAM: PORTABLE CHEST 1 VIEW COMPARISON:  February 20, 2017 FINDINGS: There is a dual lead AICD. A trace amount of linear atelectasis is seen within the left lung base. There is no evidence of acute infiltrate, pleural effusion or pneumothorax. The heart size and mediastinal contours are within normal limits. There is moderate severity calcification of the aortic arch. Degenerative changes seen within the mid and lower thoracic spine. IMPRESSION: Stable exam without acute or active cardiopulmonary disease. Electronically Signed   By: Virgina Norfolk M.D.   On: 04/23/2020 23:42   DG ABD ACUTE 2+V W 1V CHEST  Result Date: 04/25/2020 CLINICAL DATA:  Follow-up ileus EXAM: DG ABDOMEN ACUTE WITH 1 VIEW CHEST COMPARISON:  04/24/2020, 04/23/2020 FINDINGS: Cardiac shadow is mildly prominent but stable. Pacing device is again seen. Gastric catheter is noted within the stomach. The lungs show minimal left basilar atelectasis. No sizable effusion is noted. No bony abnormality is seen. Scattered large and small bowel gas is noted. No  obstructive changes are seen. No abnormal mass is noted. Diffuse vascular calcifications are seen. Prostate therapy seeds are noted. No acute bony abnormality is noted. Stable degenerative change of lumbar spine is seen IMPRESSION: No acute abnormality in the chest. Scattered large and small bowel gas without definitive obstructive change. Electronically Signed   By: Inez Catalina M.D.   On: 04/25/2020 08:13   DG Swallowing Func-Speech Pathology  Result Date: 04/26/2020 Objective Swallowing Evaluation: Type of Study: MBS-Modified Barium Swallow Study  Patient Details Name: Frank Bartlett MRN: [id number] Date of Birth: November 15, 1925  Today's Date: 04/26/2020 Time: SLP Start Time (ACUTE ONLY): 1415 -SLP Stop Time (ACUTE ONLY): 1440 SLP Time Calculation (min) (ACUTE ONLY): 25 min Past Medical History: Past Medical History: Diagnosis Date . AAA (abdominal aortic aneurysm) ([place])  . Actinic keratosis  . Aortic atherosclerosis ([place])  . Atrial fibrillation ([place]) 03/2016  brief spell . B12 deficiency  . Basal cell carcinoma 10/30/2016  L lat crown . Bradycardia  . Bradycardia 02/2017  Pacer placed . Chronic kidney disease (CKD), stage III (moderate) (HCC)  . Hyperlipidemia  . Hypertension  . Hypothyroidism  . Osteoarthritis  . Prostate cancer ([place]) 2001  Rad tx's + seed implants . Renal cancer, left ([place]) 03/2016  Left Renal Nephrectomy . Sick sinus syndrome [facility])   Pacemaker Past Surgical History: Past Surgical History: Procedure Laterality Date . CATARACT EXTRACTION, BILATERAL   . COLONOSCOPY   . EXCISIONAL HEMORRHOIDECTOMY   . INSERTION PROSTATE RADIATION SEED   . LAPAROSCOPIC NEPHRECTOMY, HAND ASSISTED Left 03/08/2016  Procedure: HAND ASSISTED LAPAROSCOPIC NEPHRECTOMY;  Surgeon: Nickie Retort, MD;  Location: [facility] ORS;  Service: Urology;  Laterality: Left; . LAPAROTOMY N/A 03/13/2016  Procedure: EXPLORATORY LAPAROTOMY;  Surgeon: Festus Aloe, MD;  Location: [facility] ORS;  Service: Urology;  Laterality: N/A; . PACEMAKER INSERTION N/A 02/20/2017  Procedure: INSERTION PACEMAKER;  Surgeon: Isaias Cowman, MD;  Location: [facility] ORS;  Service: Cardiovascular;  Laterality: N/A; HPI: JARIF NONG is a 86 y.o. male with medical history significant for hypothyroid, hypertension, depression, ventral hernia, history of renal cell carcinoma status post left renal nephrectomy, history of AAA, history of sinoatrial node dysfunction presented to the emergency department from [facility] via EMS for chief concerns of abdominal pain. NG tube placed in the ED, removed 04/26/20. Pt was started on clear liquids, however reported new onset coughing when  drinking.  Subjective: Pt reports new onset coughing with liquids Assessment / Plan / Recommendation CHL IP CLINICAL IMPRESSIONS 04/26/2020 Clinical Impression Patient presents with mild pharyngeal dysphagia, with trace aspiration occuring with thin liquids after the swallow. Suspect normative age-related changes in swallowing are exacerbated by acute illness/decreased ability to compensate. Oral stage is characterized by adequate but slow, careful mastication, functional bolus formation and anterior to posterior transfer. Pharyngeal swallow initiation occurs at the level of the valleculae, which is within functional limits for age. Pharyngeal stage is noted for mildly reduced hyolaryngeal excursion; there is mild residue in the valleculae and pyriform sinuses with thin liquids. Residue of thin was penetrated after the swallow, more so with larger and consecutive boluses, with trace aspiration x2.  Cued cough/throat clear was effective in clearing the laryngeal vestibule. Chin tuck worsened aspiration, as pt's laryngeal vestibule opened between swallows with significant deep penetration. With nectar thick and heavier boluses, there is improved stripping of residue, and pt maintains airway closure, even with multiple swallows. Recommend mechanical soft diet and nectar thick liquids as a temporary measure as pt recovers from acute  illness/is more ambulatory. Good potential for advancement to thin liquids at bedside, with small sips, throat clear as compensations. SLP Visit Diagnosis Dysphagia, pharyngeal phase (R13.13) Attention and concentration deficit following -- Frontal lobe and executive function deficit following -- Impact on safety and function Mild aspiration risk;Moderate aspiration risk   CHL IP TREATMENT RECOMMENDATION 04/26/2020 Treatment Recommendations Therapy as outlined in treatment plan below   Prognosis 04/26/2020 Prognosis for Safe Diet Advancement Good Barriers to Reach Goals -- Barriers/Prognosis  Comment -- CHL IP DIET RECOMMENDATION 04/26/2020 SLP Diet Recommendations Dysphagia 3 (Mech soft) solids;Nectar thick liquid Liquid Administration via Cup;Straw Medication Administration Whole meds with liquid Compensations Clear throat intermittently;Slow rate;Small sips/bites Postural Changes Seated upright at 90 degrees;Remain semi-upright after after feeds/meals (Comment)   CHL IP OTHER RECOMMENDATIONS 04/26/2020 Recommended Consults -- Oral Care Recommendations Oral care QID Other Recommendations Order thickener from pharmacy   CHL IP FOLLOW UP RECOMMENDATIONS 04/26/2020 Follow up Recommendations Other (comment)   CHL IP FREQUENCY AND DURATION 04/26/2020 Speech Therapy Frequency (ACUTE ONLY) min 1 x/week Treatment Duration 1 week      CHL IP ORAL PHASE 04/26/2020 Oral Phase WFL Oral - Pudding Teaspoon -- Oral - Pudding Cup -- Oral - Honey Teaspoon -- Oral - Honey Cup -- Oral - Nectar Teaspoon -- Oral - Nectar Cup -- Oral - Nectar Straw -- Oral - Thin Teaspoon -- Oral - Thin Cup -- Oral - Thin Straw -- Oral - Puree -- Oral - Mech Soft -- Oral - Regular -- Oral - Multi-Consistency -- Oral - Pill -- Oral Phase - Comment --  CHL IP PHARYNGEAL PHASE 04/26/2020 Pharyngeal Phase Impaired Pharyngeal- Pudding Teaspoon -- Pharyngeal -- Pharyngeal- Pudding Cup -- Pharyngeal -- Pharyngeal- Honey Teaspoon -- Pharyngeal -- Pharyngeal- Honey Cup -- Pharyngeal -- Pharyngeal- Nectar Teaspoon -- Pharyngeal -- Pharyngeal- Nectar Cup Delayed swallow initiation-vallecula;Reduced anterior laryngeal mobility;Pharyngeal residue - valleculae Pharyngeal Material does not enter airway Pharyngeal- Nectar Straw Delayed swallow initiation-vallecula;Reduced anterior laryngeal mobility;Pharyngeal residue - valleculae Pharyngeal Material does not enter airway Pharyngeal- Thin Teaspoon Delayed swallow initiation-vallecula;Reduced anterior laryngeal mobility;Reduced airway/laryngeal closure;Penetration/Apiration after swallow;Trace  aspiration;Pharyngeal residue - valleculae;Pharyngeal residue - pyriform Pharyngeal Material enters airway, CONTACTS cords and not ejected out;Material enters airway, passes BELOW cords then ejected out Pharyngeal- Thin Cup Delayed swallow initiation-vallecula;Reduced anterior laryngeal mobility;Reduced airway/laryngeal closure;Penetration/Apiration after swallow;Pharyngeal residue - valleculae;Pharyngeal residue - pyriform Pharyngeal Material enters airway, remains ABOVE vocal cords then ejected out;Material enters airway, CONTACTS cords and then ejected out;Material enters airway, passes BELOW cords then ejected out Pharyngeal- Thin Straw -- Pharyngeal -- Pharyngeal- Puree Delayed swallow initiation-vallecula Pharyngeal Material does not enter airway Pharyngeal- Mechanical Soft Delayed swallow initiation-vallecula Pharyngeal Material does not enter airway Pharyngeal- Regular -- Pharyngeal -- Pharyngeal- Multi-consistency -- Pharyngeal -- Pharyngeal- Pill -- Pharyngeal -- Pharyngeal Comment --  CHL IP CERVICAL ESOPHAGEAL PHASE 04/26/2020 Cervical Esophageal Phase WFL Pudding Teaspoon -- Pudding Cup -- Honey Teaspoon -- Honey Cup -- Nectar Teaspoon -- Nectar Cup -- Nectar Straw -- Thin Teaspoon -- Thin Cup -- Thin Straw -- Puree -- Mechanical Soft -- Regular -- Multi-consistency -- Pill -- Cervical Esophageal Comment -- Deneise Lever, MS, CCC-SLP Speech-Language Pathologist Aliene Altes 04/26/2020, 3:40 PM              CT Angio Abd/Pel W and/or Wo Contrast  Result Date: 04/24/2020 CLINICAL DATA:  Abdominal pain and vomiting. EXAM: CTA ABDOMEN AND PELVIS WITHOUT AND WITH CONTRAST TECHNIQUE: Multidetector CT imaging of the abdomen and pelvis was performed using  the standard protocol during bolus administration of intravenous contrast. Multiplanar reconstructed images and MIPs were obtained and reviewed to evaluate the vascular anatomy. CONTRAST:  27mL OMNIPAQUE IOHEXOL 350 MG/ML SOLN COMPARISON:  April 28, 2019 and October 28, 2018 FINDINGS: VASCULAR Aorta: Marked severity calcification and atherosclerosis with 3.6 cm x 2.9 cm infrarenal of aneurysmal dilatation. There is no evidence of dissection, vasculitis or significant stenosis. Celiac: 9.5 mm dilatation just beyond its origin, without evidence of dissection, vasculitis or significant stenosis. SMA: Patent without evidence of aneurysm, dissection, vasculitis or significant stenosis. Renals: The right renal artery is patent without evidence of aneurysm, dissection, vasculitis, fibromuscular dysplasia or significant stenosis. IMA: Patent without evidence of aneurysm, dissection, vasculitis or significant stenosis. Inflow: Marked severity calcification and atherosclerosis without evidence of aneurysmal dilatation, dissection, vasculitis or significant stenosis. Proximal Outflow: Marked severity calcification and atherosclerosis is seen involving the bilateral common femoral and visualized portions of the superficial and profunda femoral arteries, without evidence of aneurysm, dissection, vasculitis or significant stenosis. Veins: No obvious venous abnormality within the limitations of this arterial phase study. Review of the MIP images confirms the above findings. NON-VASCULAR Lower chest: No acute abnormality. Hepatobiliary: There is diffuse fatty infiltration of the liver parenchyma. Numerous punctate calcified granulomas are seen scattered throughout the liver. A stable 1.1 cm cystic appearing areas seen within the right lobe. No gallstones, gallbladder wall thickening, or biliary dilatation. Pancreas: Punctate calcifications are seen along the junction of the pancreatic body and head. A stable 1.7 cm x 0.8 cm cystic appearing area is seen within the tail of the pancreas (axial CT image 54, CT series number 4). Spleen: Numerous punctate calcified granulomas are seen scattered throughout the spleen. Adrenals/Urinary Tract: Adrenal glands are unremarkable. The left  kidney is absent. The right kidney is normal in size, without renal calculi or hydronephrosis. 3.1 cm x 1.7 cm and 1.5 cm diameter cysts are seen within the lower pole of the right kidney. Bladder is unremarkable. Stomach/Bowel: There is a small hiatal hernia. Appendix appears normal. Multiple dilated small bowel loops are seen throughout the abdomen (maximum small bowel diameter of approximately 3.0 cm). While these small bowel loops extend through a ventral hernia, a transition zone is seen along the anterior aspect of the left lower quadrant (axial CT images 127 through 134, CT series number 4). Noninflamed diverticula are seen throughout the large bowel. Lymphatic: No abnormal abdominal or pelvic lymph nodes are identified. Reproductive: Numerous prostate radiation implantation seeds are seen. Other: A stable 7.2 cm x 3.4 cm ventral hernia is noted along the midline of the upper abdomen. This contains a segment of nondilated transverse colon. A stable 6.0 cm x 2.7 cm ventral hernia, containing a short segment of dilated small bowel, is noted just above the umbilicus. Musculoskeletal: Multilevel degenerative changes seen throughout the lumbar spine. IMPRESSION: 1. Small bowel obstruction with a transition zone seen within the left lower quadrant. 2. Marked severity diffuse calcification and atherosclerosis with stable aneurysmal dilatation of the infrarenal abdominal aorta. 3. Stable ventral hernias, as described above. 4. Absent left kidney. 5. Colonic diverticulosis. 6. Small hiatal hernia. 7. Stable cystic appearing area within the tail of the pancreas. Correlation with surveillance MRI is recommended to confirm stability and exclude the presence of an underlying neoplastic process. Aortic Atherosclerosis (ICD10-I70.0). Electronically Signed   By: Virgina Norfolk M.D.   On: 04/24/2020 00:23     Subjective: Patient was seen and examined at bedside.  Overnight events noted.  Patient  reports feeling much  better.   Abdominal pain has resolved,  Patient has had a bowel movement yesterday. Patient has been tolerating thick liquids.   Patient wants to be discharged.  Discharge Exam: Vitals:   04/28/20 0541 04/28/20 0740  BP: 120/66 126/72  Pulse: 60 69  Resp: 16 19  Temp: 99.1 F (37.3 C) 98.5 F (36.9 C)  SpO2: 95% 95%   Vitals:   04/27/20 2021 04/28/20 0144 04/28/20 0541 04/28/20 0740  BP: 122/66 123/74 120/66 126/72  Pulse: 63 76 60 69  Resp: 18 18 16 19   Temp: 98.1 F (36.7 C) 98.2 F (36.8 C) 99.1 F (37.3 C) 98.5 F (36.9 C)  TempSrc:      SpO2: 94% 96% 95% 95%  Weight:      Height:        General: Pt is alert, awake, not in acute distress Cardiovascular: RRR, S1/S2 +, no rubs, no gallops Respiratory: CTA bilaterally, no wheezing, no rhonchi Abdominal: Soft, NT, ND, bowel sounds +, ventral hernia noted. Extremities: no edema, no cyanosis    The results of significant diagnostics from this hospitalization (including imaging, microbiology, ancillary and laboratory) are listed below for reference.     Microbiology: Recent Results (from the past 240 hour(s))  Resp Panel by RT-PCR (Flu A&B, Covid) Nasopharyngeal Swab     Status: None   Collection Time: 04/23/20 11:58 PM   Specimen: Nasopharyngeal Swab; Nasopharyngeal(NP) swabs in vial transport medium  Result Value Ref Range Status   SARS Coronavirus 2 by RT PCR NEGATIVE NEGATIVE Final    Comment: (NOTE) SARS-CoV-2 target nucleic acids are NOT DETECTED.  The SARS-CoV-2 RNA is generally detectable in upper respiratory specimens during the acute phase of infection. The lowest concentration of SARS-CoV-2 viral copies this assay can detect is 138 copies/mL. A negative result does not preclude SARS-Cov-2 infection and should not be used as the sole basis for treatment or other patient management decisions. A negative result may occur with  improper specimen collection/handling, submission of specimen other than  nasopharyngeal swab, presence of viral mutation(s) within the areas targeted by this assay, and inadequate number of viral copies(<138 copies/mL). A negative result must be combined with clinical observations, patient history, and epidemiological information. The expected result is Negative.  Fact Sheet for Patients:  EntrepreneurPulse.com.au  Fact Sheet for Healthcare Providers:  IncredibleEmployment.be  This test is no t yet approved or cleared by the [place] FDA and  has been authorized for detection and/or diagnosis of SARS-CoV-2 by FDA under an Emergency Use Authorization (EUA). This EUA will remain  in effect (meaning this test can be used) for the duration of the COVID-19 declaration under Section 564(b)(1) of the Act, 21 U.S.C.section 360bbb-3(b)(1), unless the authorization is terminated  or revoked sooner.       Influenza A by PCR NEGATIVE NEGATIVE Final   Influenza B by PCR NEGATIVE NEGATIVE Final    Comment: (NOTE) The Xpert Xpress SARS-CoV-2/FLU/RSV plus assay is intended as an aid in the diagnosis of influenza from Nasopharyngeal swab specimens and should not be used as a sole basis for treatment. Nasal washings and aspirates are unacceptable for Xpert Xpress SARS-CoV-2/FLU/RSV testing.  Fact Sheet for Patients: EntrepreneurPulse.com.au  Fact Sheet for Healthcare Providers: IncredibleEmployment.be  This test is not yet approved or cleared by the [place] FDA and has been authorized for detection and/or diagnosis of SARS-CoV-2 by FDA under an Emergency Use Authorization (EUA). This EUA will remain in effect (meaning this test  can be used) for the duration of the COVID-19 declaration under Section 564(b)(1) of the Act, 21 U.S.C. section 360bbb-3(b)(1), unless the authorization is terminated or revoked.  Performed at [facility], [address] [place], [place]  [address]      Labs: BNP (last 3 results) No results for input(s): BNP in the last 8760 hours. Basic Metabolic Panel: Recent Labs  Lab 04/23/20 2301 04/24/20 0557 04/25/20 0506 04/28/20 0522  NA 132* 134* 136 135  K 4.4 4.7 4.1 4.3  CL 98 103 107 105  CO2 21* 22 23 22   GLUCOSE 130* 120* 94 115*  BUN 38* 33* 22 24*  CREATININE 1.94* 1.52* 1.32* 1.41*  CALCIUM 9.6 8.4* 8.3* 8.2*   Liver Function Tests: Recent Labs  Lab 04/23/20 2301 04/25/20 0506  AST 22 19  ALT 16 13  ALKPHOS 87 66  BILITOT 0.7 0.9  PROT 8.0 6.2*  ALBUMIN 4.4 3.3*   Recent Labs  Lab 04/23/20 2301  LIPASE 38   No results for input(s): AMMONIA in the last 168 hours. CBC: Recent Labs  Lab 04/23/20 2301 04/24/20 0557 04/25/20 0506 04/28/20 0522  WBC 11.5* 6.9 7.1 6.2  HGB 13.0 11.7* 11.1* 10.7*  HCT 38.9* 34.3* 33.6* 31.5*  MCV 96.3 96.3 97.7 96.6  PLT 267 225 223 217   Cardiac Enzymes: No results for input(s): CKTOTAL, CKMB, CKMBINDEX, TROPONINI in the last 168 hours. BNP: Invalid input(s): POCBNP CBG: No results for input(s): GLUCAP in the last 168 hours. D-Dimer No results for input(s): DDIMER in the last 72 hours. Hgb A1c No results for input(s): HGBA1C in the last 72 hours. Lipid Profile No results for input(s): CHOL, HDL, LDLCALC, TRIG, CHOLHDL, LDLDIRECT in the last 72 hours. Thyroid function studies No results for input(s): TSH, T4TOTAL, T3FREE, THYROIDAB in the last 72 hours.  Invalid input(s): FREET3 Anemia work up No results for input(s): VITAMINB12, FOLATE, FERRITIN, TIBC, IRON, RETICCTPCT in the last 72 hours. Urinalysis    Component Value Date/Time   COLORURINE YELLOW (A) 04/23/2020 2301   APPEARANCEUR CLEAR (A) 04/23/2020 2301   LABSPEC 1.040 (H) 04/23/2020 2301   PHURINE 5.0 04/23/2020 2301   GLUCOSEU NEGATIVE 04/23/2020 2301   HGBUR NEGATIVE 04/23/2020 2301   BILIRUBINUR NEGATIVE 04/23/2020 2301   KETONESUR NEGATIVE 04/23/2020 2301   PROTEINUR NEGATIVE  04/23/2020 2301   NITRITE NEGATIVE 04/23/2020 2301   LEUKOCYTESUR NEGATIVE 04/23/2020 2301   Sepsis Labs Invalid input(s): PROCALCITONIN,  WBC,  LACTICIDVEN Microbiology Recent Results (from the past 240 hour(s))  Resp Panel by RT-PCR (Flu A&B, Covid) Nasopharyngeal Swab     Status: None   Collection Time: 04/23/20 11:58 PM   Specimen: Nasopharyngeal Swab; Nasopharyngeal(NP) swabs in vial transport medium  Result Value Ref Range Status   SARS Coronavirus 2 by RT PCR NEGATIVE NEGATIVE Final    Comment: (NOTE) SARS-CoV-2 target nucleic acids are NOT DETECTED.  The SARS-CoV-2 RNA is generally detectable in upper respiratory specimens during the acute phase of infection. The lowest concentration of SARS-CoV-2 viral copies this assay can detect is 138 copies/mL. A negative result does not preclude SARS-Cov-2 infection and should not be used as the sole basis for treatment or other patient management decisions. A negative result may occur with  improper specimen collection/handling, submission of specimen other than nasopharyngeal swab, presence of viral mutation(s) within the areas targeted by this assay, and inadequate number of viral copies(<138 copies/mL). A negative result must be combined with clinical observations, patient history, and epidemiological information.  The expected result is Negative.  Fact Sheet for Patients:  EntrepreneurPulse.com.au  Fact Sheet for Healthcare Providers:  IncredibleEmployment.be  This test is no t yet approved or cleared by the [place] FDA and  has been authorized for detection and/or diagnosis of SARS-CoV-2 by FDA under an Emergency Use Authorization (EUA). This EUA will remain  in effect (meaning this test can be used) for the duration of the COVID-19 declaration under Section 564(b)(1) of the Act, 21 U.S.C.section 360bbb-3(b)(1), unless the authorization is terminated  or revoked sooner.        Influenza A by PCR NEGATIVE NEGATIVE Final   Influenza B by PCR NEGATIVE NEGATIVE Final    Comment: (NOTE) The Xpert Xpress SARS-CoV-2/FLU/RSV plus assay is intended as an aid in the diagnosis of influenza from Nasopharyngeal swab specimens and should not be used as a sole basis for treatment. Nasal washings and aspirates are unacceptable for Xpert Xpress SARS-CoV-2/FLU/RSV testing.  Fact Sheet for Patients: EntrepreneurPulse.com.au  Fact Sheet for Healthcare Providers: IncredibleEmployment.be  This test is not yet approved or cleared by the [place] FDA and has been authorized for detection and/or diagnosis of SARS-CoV-2 by FDA under an Emergency Use Authorization (EUA). This EUA will remain in effect (meaning this test can be used) for the duration of the COVID-19 declaration under Section 564(b)(1) of the Act, 21 U.S.C. section 360bbb-3(b)(1), unless the authorization is terminated or revoked.  Performed at [facility], [address], [place], [place] [address]      Time coordinating discharge: Over 30 minutes  SIGNED:   Shawna Clamp, MD  [facility] 04/28/2020, 11:11 AM Pager   If 7PM-7AM, please contact night-coverage [email or web address]

## 2020-04-28 NOTE — TOC Transition Note (Addendum)
Transition of Care [facility]) - CM/SW Discharge Note   Patient Details  Name: Frank Bartlett MRN: [id number] Date of Birth: 1925-07-22  Transition of Care [facility]) CM/SW Contact:  Frank Ivan, LCSW Phone Number: 04/28/2020, 12:35 PM   Clinical Narrative:   Patient to discharge back to [facility] ALF with Home Health PT. Confirmed with Frank Bartlett at [facility]. Asked RN to call report. Faxed FL2 and DC Summary to [facility]. [facility] to pick patient up at [employer]. RN aware. Updated patient and patient's son Frank Bartlett.   Final next level of care: Assisted Living Barriers to Discharge: Barriers Resolved   Patient Goals and CMS Choice Patient states their goals for this hospitalization and ongoing recovery are:: back to ALF with home health CMS Medicare.gov Compare Post Acute Care list provided to:: Patient Choice offered to / list presented to : Patient  Discharge Placement                Patient to be transferred to facility by: [facility] transport Name of family member notified: Frank Bartlett. Patient also notified at bedside Patient and family notified of of transfer: 04/28/20  Discharge Plan and Services   Discharge Planning Services: CM Consult            DME Arranged: N/A DME Agency: NA                  Social Determinants of Health ([place]) Interventions     Readmission Risk Interventions No flowsheet data found.

## 2020-04-28 NOTE — Progress Notes (Signed)
Report given to Luellen Pucker, Therapist, sports at [facility]. Discharge information sent with patient.

## 2020-04-28 NOTE — NC FL2 (Signed)
[place] LEVEL OF CARE SCREENING TOOL     IDENTIFICATION  Patient Name: Frank Bartlett Birthdate: [birth year] Sex: male Admission Date (Current Location): 04/23/2020  [facility] and [place] Number:  Engineering geologist and Address:  [facility], [address], [place], [place] [address]      Provider Number: [id number]  Attending Physician Name and Address:  Shawna Clamp, MD  Relative Name and Phone Number:  [name], [name])   [phone] [facility])    Current Level of Care: Hospital Recommended Level of Care: [place] Prior Approval Number:    Date Approved/Denied:   PASRR Number:    Discharge Plan:  (ALF with Home Health PT)    Current Diagnoses: Patient Active Problem List   Diagnosis Date Noted  . SBO (small bowel obstruction) ([place]) 04/24/2020  . Mood disorder ([place]) 08/06/2019  . HLD (hyperlipidemia) 04/24/2019  . Sick sinus syndrome ([place])   . AAA (abdominal aortic aneurysm) ([place])   . Aortic atherosclerosis ([place])   . Hypertension   . Hypothyroidism   . Osteoarthritis of right knee   . CKD (chronic kidney disease) stage 3, GFR 30-59 ml/min (HCC) 06/01/2016  . Paroxysmal atrial flutter ([place]) 05/16/2016  . History of renal cell cancer 03/26/2016    Orientation RESPIRATION BLADDER Height & Weight     Self,Time,Situation,Place  Normal Incontinent,External catheter Weight: 184 lb (83.5 kg) Height:  5\' 9"  (175.3 cm)  BEHAVIORAL SYMPTOMS/MOOD NEUROLOGICAL BOWEL NUTRITION STATUS      Incontinent Diet (dys 3, nectar thick)  AMBULATORY STATUS COMMUNICATION OF NEEDS Skin   Limited Assist Verbally Normal                       Personal Care Assistance Level of Assistance  Bathing,Feeding,Dressing Bathing Assistance: Independent Feeding assistance: Independent Dressing Assistance: Independent     Functional Limitations Info             SPECIAL CARE FACTORS FREQUENCY  PT (By licensed PT),OT  (By licensed OT)     PT Frequency: home health OT Frequency: home health            Contractures Contractures Info: Not present    Additional Factors Info  Code Status,Allergies Code Status Info: DNR Allergies Info: nka           Current Medications (04/28/2020):  This is the current hospital active medication list Current Facility-Administered Medications  Medication Dose Route Frequency Provider Last Rate Last Admin  . acetaminophen (TYLENOL) tablet 325 mg  325 mg Per Tube Q6H PRN Renda Rolls, RPH       Or  . acetaminophen (TYLENOL) suppository 325 mg  325 mg Rectal Q6H PRN Renda Rolls, RPH      . amLODipine (NORVASC) tablet 10 mg  10 mg Per Tube Daily Renda Rolls, RPH   10 mg at 04/28/20 0933  . enoxaparin (LOVENOX) injection 40 mg  40 mg Subcutaneous Q24H Cox, Amy N, DO   40 mg at 04/28/20 0932  . feeding supplement (BOOST / RESOURCE BREEZE) liquid 1 Container  1 Container Oral TID BM Tylene Fantasia, PA-C   1 Container at 04/28/20 [id number]  . levothyroxine (SYNTHROID) tablet 112 mcg  112 mcg Per Tube QAC breakfast Renda Rolls, RPH   112 mcg at 04/28/20 [id number]  . lisinopril (ZESTRIL) tablet 20 mg  20 mg Per Tube Daily Renda Rolls, RPH   20 mg at 04/28/20 [id number]  .  ondansetron (ZOFRAN) tablet 4 mg  4 mg Per Tube Q6H PRN Renda Rolls, RPH       Or  . ondansetron (ZOFRAN) injection 4 mg  4 mg Intravenous Q6H PRN Belue, Alver Sorrow, RPH      . phenol (CHLORASEPTIC) mouth spray 1 spray  1 spray Mouth/Throat PRN Cox, Amy N, DO   1 spray at 04/25/20 0447  . sertraline (ZOLOFT) tablet 50 mg  50 mg Per Tube Daily Renda Rolls, RPH   50 mg at 04/28/20 2902     Discharge Medications: Please see discharge summary for a list of discharge medications.  Relevant Imaging Results:  Relevant Lab Results:   Additional Information SS#: [id number]  [place], LCSW

## 2020-04-28 NOTE — Evaluation (Signed)
Physical Therapy Evaluation Patient Details Name: Frank Bartlett MRN: [id number] DOB: July 29, 1925 Today's Date: 04/28/2020   History of Present Illness  86 y.o. male with medical history significant for hypothyroid, hypertension, depression, ventral hernia, history of renal cell carcinoma status post left renal nephrectomy, history of AAA, history of sinoatrial node dysfunction.  He  presented to the ED from [facility] for abdominal pain.  Has been conservatively treated for SBO.  Clinical Impression  Pt did well with ambulation and confidently circumambulated the nurses' station, we were completing the second loop when he had some bowel incontinence issues and we sat and returned to room/commode.  He did have some struggle with getting up to standing from standard height surfaces, but ultimately got up w/o direct assist (after repeated momentum gathering rocking) on every effort.  Pt is weaker than his baseline, but should be safe to return to ALF setting with HHPT.     Follow Up Recommendations Home health PT    Equipment Recommendations  None recommended by PT    Recommendations for Other Services       Precautions / Restrictions Precautions Precautions: Fall Restrictions Weight Bearing Restrictions: No      Mobility  Bed Mobility Overal bed mobility: Modified Independent             General bed mobility comments: Pt was able to get himself up to sitting with some extra time and effort.  He did need bed rails as he struggled to fully elevate trunk but managed to get up and scoot to EOB w/o physical assist.    Transfers Overall transfer level: Needs assistance Equipment used: Rolling walker (2 wheeled) Transfers: Sit to/from Stand Sit to Stand: Min guard         General transfer comment: multiple sit to stand efforts from standard height bed and recliner.  Each time he was ultimately able to rise w/o direct assist but did typically need to rock to get momentum and  extra cuing to appropriately use UEs ro assist  Ambulation/Gait Ambulation/Gait assistance: Supervision Gait Distance (Feet): 300 Feet Assistive device: Rolling walker (2 wheeled)       General Gait Details: Pt was able to maintain safe and confident ambulation w/o the issues.  Good overall tolerance with O2 remaining in the 90s and HR also staying stable in the 80-100 range.  Pt uses 4WW at baseline and does mention how own 2WW did not steer and handle as easily as his.  No LOBs or other safety issues  Stairs            Wheelchair Mobility    Modified Rankin (Stroke Patients Only)       Balance Overall balance assessment: Modified Independent                                           Pertinent Vitals/Pain Pain Assessment: No/denies pain    Home Living Family/patient expects to be discharged to:: Assisted living               Home Equipment: Walker - 4 wheels      Prior Function Level of Independence: Independent with assistive device(s)         Comments: Pt reports that he walks at least 1/4 most days with 4WW, can dress himself and stay relatively active     Hand Dominance  Extremity/Trunk Assessment   Upper Extremity Assessment Upper Extremity Assessment: Generalized weakness;Overall WFL for tasks assessed (age appropriate defecits)    Lower Extremity Assessment Lower Extremity Assessment: Generalized weakness;Overall WFL for tasks assessed (age appropriate defecits)       Communication   Communication: No difficulties  Cognition Arousal/Alertness: Awake/alert Behavior During Therapy: WFL for tasks assessed/performed Overall Cognitive Status: Within Functional Limits for tasks assessed                                        General Comments General comments (skin integrity, edema, etc.): Pt did have some issues with bowel incontenence during ambulation, did cut walk short to get back to  room/commode    Exercises     Assessment/Plan    PT Assessment Patient needs continued PT services  PT Problem List Decreased strength;Decreased mobility;Decreased safety awareness;Decreased knowledge of use of DME;Decreased activity tolerance       PT Treatment Interventions DME instruction;Gait training;Functional mobility training;Therapeutic activities;Therapeutic exercise;Balance training;Neuromuscular re-education    PT Goals (Current goals can be found in the Care Plan section)  Acute Rehab PT Goals Patient Stated Goal: Go home PT Goal Formulation: With patient Time For Goal Achievement: 05/12/20 Potential to Achieve Goals: Good    Frequency Min 2X/week   Barriers to discharge        Co-evaluation               AM-PAC PT "6 Clicks" Mobility  Outcome Measure Help needed turning from your back to your side while in a flat bed without using bedrails?: None Help needed moving from lying on your back to sitting on the side of a flat bed without using bedrails?: None Help needed moving to and from a bed to a chair (including a wheelchair)?: A Little Help needed standing up from a chair using your arms (e.g., wheelchair or bedside chair)?: A Little Help needed to walk in hospital room?: A Little Help needed climbing 3-5 steps with a railing? : A Little 6 Click Score: 20    End of Session Equipment Utilized During Treatment: Gait belt Activity Tolerance: Patient tolerated treatment well Patient left: Other (comment) (on commode, nursing aware - pt to use call bell)   PT Visit Diagnosis: Muscle weakness (generalized) (M62.81);Difficulty in walking, not elsewhere classified (R26.2)    Time: [id number] PT Time Calculation (min) (ACUTE ONLY): 29 min   Charges:   PT Evaluation $PT Eval Low Complexity: 1 Low PT Treatments $Gait Training: 8-22 mins        Kreg Shropshire, DPT 04/28/2020, 11:36 AM

## 2020-05-01 LAB — URINE CULTURE: Culture: >=100000 — AB

## 2020-05-02 ENCOUNTER — Other Ambulatory Visit: Payer: Medicare Other

## 2020-05-04 ENCOUNTER — Ambulatory Visit: Payer: Medicare Other | Admitting: Internal Medicine

## 2020-05-04 VITALS — BP 116/75 | HR 74 | Temp 97.6°F | Resp 18 | Wt 182.6 lb

## 2020-05-04 DIAGNOSIS — K56600 Partial intestinal obstruction, unspecified as to cause: Secondary | ICD-10-CM

## 2020-05-04 DIAGNOSIS — D649 Anemia, unspecified: Secondary | ICD-10-CM

## 2020-05-05 ENCOUNTER — Encounter: Payer: Self-pay | Admitting: Internal Medicine

## 2020-05-05 NOTE — Progress Notes (Signed)
Subjective:    Patient ID: Frank Bartlett, male    DOB: December 06, 1925, 86 y.o.   MRN: [id number]  HPI  Resident seen in [address] for Hospital follow up.  Went to the ER 12/18 with c/o abdominal pain. KUB and CT abdomen c/w partial SBO. NG tube was placed for decompression, pt was kept NPO, hydrated with IV fluids. GI was consulted, recommend medical management. He was discharged 12/23. Since that time, he reports scratchy throat and hoarseness from the NG tube that seems to be getting better. His appetite isn't great but improving. He denies nausea or vomiting. He report daily bowel movements. He has has some bleeding hemorrhoids but otherwise denies blood in his stool. He was slightly anemic at discharge, HGB of 31 down from 38 on admission.  Review of Systems      Past Medical History:  Diagnosis Date  . AAA (abdominal aortic aneurysm) ([place])   . Actinic keratosis   . Aortic atherosclerosis ([place])   . Atrial fibrillation ([place]) 03/2016   brief spell  . B12 deficiency   . Basal cell carcinoma 10/30/2016   L [place]  . Bradycardia   . Bradycardia 02/2017   Pacer placed  . Chronic kidney disease (CKD), stage III (moderate) (HCC)   . Hyperlipidemia   . Hypertension   . Hypothyroidism   . Osteoarthritis   . Prostate cancer ([place]) 2001   Rad tx's + seed implants  . Renal cancer, left ([place]) 03/2016   Left Renal Nephrectomy  . Sick sinus syndrome [facility])    Pacemaker    Current Outpatient Medications  Medication Sig Dispense Refill  . acetaminophen (TYLENOL) 500 MG tablet Take 1,000 mg by mouth daily. And 1000mg  twice a day prn    . amLODipine (NORVASC) 10 MG tablet Take 1 tablet (10 mg total) by mouth daily. 90 tablet 3  . aspirin EC 81 MG tablet Take 81 mg by mouth daily.     . Cyanocobalamin 1000 MCG TBCR Take 1,000 mcg by mouth daily.     Marland Kitchen docusate sodium (COLACE) 100 MG capsule Take 100 mg by mouth daily.    Marland Kitchen levothyroxine (SYNTHROID, LEVOTHROID) 112 MCG tablet Take 112  mcg by mouth daily before breakfast.    . lisinopril (PRINIVIL,ZESTRIL) 20 MG tablet Take 20 mg by mouth daily.    . Multiple Vitamin (MULTI-VITAMINS) TABS Take 1 tablet by mouth daily.    . phenol (CHLORASEPTIC) 1.4 % LIQD Use as directed 1 spray in the mouth or throat as needed for throat irritation / pain. 29 mL 0  . PROCTO-MED HC 2.5 % rectal cream Apply 1 application topically as directed.    . sertraline (ZOLOFT) 50 MG tablet Take 50 mg by mouth daily.     No current facility-administered medications for this visit.    No Known Allergies  Family History  Problem Relation Age of Onset  . Hypertension Mother   . Breast cancer Mother   . Colon cancer Father   . Prostate cancer Neg Hx   . Bladder Cancer Neg Hx   . Kidney cancer Neg Hx     Social History   Socioeconomic History  . Marital status: Widowed    Spouse name: Not on file  . Number of children: Not on file  . Years of education: Not on file  . Highest education level: Not on file  Occupational History  . Occupation: Comptroller/accountant    Comment: Retired  Tobacco Use  .  Smoking status: Never Smoker  . Smokeless tobacco: Never Used  Substance and Sexual Activity  . Alcohol use: No    Alcohol/week: 0.0 standard drinks    Comment: rare wine  . Drug use: No  . Sexual activity: Not Currently  Other Topics Concern  . Not on file  Social History Narrative   Widowed 1/18   3 children      Has living will   Son [name] is [facility] POA   Has DNR   No tube feeds if cognitively unaware   Social Determinants of Health   Financial Resource Strain: Not on file  Food Insecurity: Not on file  Transportation Needs: Not on file  Physical Activity: Not on file  Stress: Not on file  Social Connections: Not on file  Intimate Partner Violence: Not on file     Constitutional: Denies fever, malaise, fatigue, headache or abrupt weight changes.  HEENT: Pt reports hoarseness. Denies eye pain, eye redness, ear pain,  ringing in the ears, wax buildup, runny nose, nasal congestion, bloody nose, or sore throat. Respiratory: Denies difficulty breathing, shortness of breath, cough or sputum production.   Cardiovascular: Denies chest pain, chest tightness, palpitations or swelling in the hands or feet.  Gastrointestinal: Denies abdominal pain, bloating, constipation, diarrhea or blood in the stool.   No other specific complaints in a complete review of systems (except as listed in HPI above).  Objective:   Physical Exam   BP 116/75   Pulse 74   Temp 97.6 F (36.4 C)   Resp 18   Wt 182 lb 9.6 oz (82.8 kg)   BMI 26.97 kg/m  Wt Readings from Last 3 Encounters:  05/05/20 182 lb 9.6 oz (82.8 kg)  04/24/20 184 lb (83.5 kg)  02/25/20 185 lb 3.2 oz (84 kg)    General: Appears his stated age, well developed, well nourished in NAD. Cardiovascular: Normal rate and rhythm. S1,S2 noted.  No murmur, rubs or gallops noted.  Pulmonary/Chest: Normal effort and positive vesicular breath sounds. No respiratory distress. No wheezes, rales or ronchi noted.  Abdomen: Soft and nontender. Hyperactive bowel sounds. Ventral hernia noted. Neurological: Alert and oriented.   BMET    Component Value Date/Time   NA 135 04/28/2020 0522   NA 133 (A) 10/02/2018 0000   K 4.3 04/28/2020 0522   CL 105 04/28/2020 0522   CO2 22 04/28/2020 0522   GLUCOSE 115 (H) 04/28/2020 0522   BUN 24 (H) 04/28/2020 0522   BUN 24 10/11/2017 1004   CREATININE 1.41 (H) 04/28/2020 0522   CALCIUM 8.2 (L) 04/28/2020 0522   GFRNONAA 47 (L) 04/28/2020 0522   GFRAA 50 (L) 10/11/2017 1004    Lipid Panel  No results found for: CHOL, TRIG, HDL, CHOLHDL, VLDL, LDLCALC  CBC    Component Value Date/Time   WBC 6.2 04/28/2020 0522   RBC 3.26 (L) 04/28/2020 0522   HGB 10.7 (L) 04/28/2020 0522   HGB 11.0 (L) 04/17/2017 1031   HCT 31.5 (L) 04/28/2020 0522   HCT 33.9 (L) 04/17/2017 1031   PLT 217 04/28/2020 0522   PLT 293 04/17/2017 1031   MCV  96.6 04/28/2020 0522   MCV 94 04/17/2017 1031   MCH 32.8 04/28/2020 0522   MCHC 34.0 04/28/2020 0522   RDW 13.0 04/28/2020 0522   RDW 14.1 04/17/2017 1031   LYMPHSABS 0.5 (L) 03/12/2016 2216   MONOABS 0.6 03/12/2016 2216   EOSABS 0.1 03/12/2016 2216   BASOSABS 0.1 03/12/2016 2216  Hgb A1C No results found for: HGBA1C         Assessment & Plan:   Hospital Follow Up for Partial SBO:  Hospital notes, labs and imaging reviewed Continue Colace, encouraged high fiber diet and adequate water intake Clinically improving Will continue to monitor at this time  Anemia:  Will check CBC with diff and IBC panel for further evaluation  Will reassess as needed Nicki Reaper, NP

## 2020-05-05 NOTE — Patient Instructions (Signed)
Bowel Obstruction °A bowel obstruction means that something is blocking the small or large bowel. The bowel is also called the intestine. It is the long tube that connects the stomach to the opening of the butt (anus). When something blocks the bowel, food and fluids cannot pass through like normal. This condition needs to be treated. Treatment depends on the cause of the problem and how bad the problem is. °What are the causes? °Common causes of this condition include: °· Scar tissue (adhesions) from past surgery or from high-energy X-rays (radiation). °· Recent surgery in the belly. This affects how food moves in the bowel. °· Some diseases, such as: °? Irritation of the lining of the digestive tract (Crohn's disease). °? Irritation of small pouches in the bowel (diverticulitis). °· Growths or tumors. °· A bulging organ (hernia). °· Twisting of the bowel (volvulus). °· A foreign body. °· Slipping of a part of the bowel into another part (intussusception). °What are the signs or symptoms? °Symptoms of this condition include: °· Pain in the belly. °· Feeling sick to your stomach (nauseous). °· Throwing up (vomiting). °· Bloating in the belly. °· Being unable to pass gas. °· Trouble pooping (constipation). °· Watery poop (diarrhea). °· A lot of belching. °How is this diagnosed? °This condition may be diagnosed based on: °· A physical exam. °· Medical history. °· Imaging tests, such as X-ray or CT scan. °· Blood tests. °· Urine tests. °How is this treated? °Treatment for this condition may include: °· Fluids and pain medicines that are given through an IV tube. Your doctor may tell you not to eat or drink if you feel sick to your stomach and are throwing up. °· Eating a clear liquid diet for a few days. °· Putting a small tube (nasogastric tube) into the stomach. This will help with pain, discomfort, and nausea by removing blocked air and fluids from the stomach. °· Surgery. This may be needed if other treatments do  not work. °Follow these instructions at home: °Medicines °· Take over-the-counter and prescription medicines only as told by your doctor. °· If you were prescribed an antibiotic medicine, take it as told by your doctor. Do not stop taking the antibiotic even if you start to feel better. °General instructions °· Follow your diet as told by your doctor. You may need to: °? Only drink clear liquids until you start to get better. °? Avoid solid foods. °· Return to your normal activities as told by your doctor. Ask your doctor what activities are safe for you. °· Do not sit for a long time without moving. Get up to take short walks every 1-2 hours. This is important. Ask for help if you feel weak or unsteady. °· Keep all follow-up visits as told by your doctor. This is important. °How is this prevented? °After having a bowel obstruction, you may be more likely to have another. You can do some things to stop it from happening again. °· If you have a long-term (chronic) disease, contact your doctor if you see changes or problems. °· Take steps to prevent or treat trouble pooping. Your doctor may ask that you: °? Drink enough fluid to keep your pee (urine) pale yellow. °? Take over-the-counter or prescription medicines. °? Eat foods that are high in fiber. These include beans, whole grains, and fresh fruits and vegetables. °? Limit foods that are high in fat and sugar. These include fried or sweet foods. °· Stay active. Ask your doctor which exercises are   safe for you. °· Avoid stress. °· Eat three small meals and three small snacks each day. °· Work with a food expert (dietitian) to make a meal plan that works for you. °· Do not use any products that contain nicotine or tobacco, such as cigarettes and e-cigarettes. If you need help quitting, ask your doctor. °Contact a doctor if: °· You have a fever. °· You have chills. °Get help right away if: °· You have pain or cramps that get worse. °· You throw up blood. °· You are  sick to your stomach. °· You cannot stop throwing up. °· You cannot drink fluids. °· You feel mixed up (confused). °· You feel very thirsty (dehydrated). °· Your belly gets more bloated. °· You feel weak or you pass out (faint). °Summary °· A bowel obstruction means that something is blocking the small or large bowel. °· Treatment may include IV fluids and pain medicine. You may also have a clear liquid diet, a small tube in your stomach, or surgery. °· Drink clear liquids and avoid solid foods until you get better. °This information is not intended to replace advice given to you by your health care provider. Make sure you discuss any questions you have with your health care provider. °Document Revised: 09/04/2017 Document Reviewed: 09/04/2017 °Elsevier Patient Education © 2020 [employer] Inc. ° °

## 2020-05-09 ENCOUNTER — Ambulatory Visit: Payer: Medicare Other | Admitting: Urology

## 2020-05-09 ENCOUNTER — Ambulatory Visit
Admission: RE | Admit: 2020-05-09 | Discharge: 2020-05-09 | Disposition: A | Discharge status: Home / Self Care | Payer: Medicare Other | Source: Ambulatory Visit | Attending: Urology | Admitting: Urology

## 2020-05-09 ENCOUNTER — Other Ambulatory Visit: Payer: Self-pay

## 2020-05-09 DIAGNOSIS — K5730 Diverticulosis of large intestine without perforation or abscess without bleeding: Secondary | ICD-10-CM | POA: Insufficient documentation

## 2020-05-09 DIAGNOSIS — C649 Malignant neoplasm of unspecified kidney, except renal pelvis: Secondary | ICD-10-CM | POA: Diagnosis not present

## 2020-05-09 DIAGNOSIS — C642 Malignant neoplasm of left kidney, except renal pelvis: Secondary | ICD-10-CM | POA: Diagnosis not present

## 2020-05-09 DIAGNOSIS — N183 Chronic kidney disease, stage 3 unspecified: Secondary | ICD-10-CM | POA: Diagnosis not present

## 2020-05-09 DIAGNOSIS — I2510 Atherosclerotic heart disease of native coronary artery without angina pectoris: Secondary | ICD-10-CM | POA: Insufficient documentation

## 2020-05-09 DIAGNOSIS — Z905 Acquired absence of kidney: Secondary | ICD-10-CM | POA: Diagnosis not present

## 2020-05-09 DIAGNOSIS — I358 Other nonrheumatic aortic valve disorders: Secondary | ICD-10-CM | POA: Diagnosis not present

## 2020-05-09 DIAGNOSIS — I714 Abdominal aortic aneurysm, without rupture: Secondary | ICD-10-CM | POA: Diagnosis not present

## 2020-05-09 DIAGNOSIS — I700 Atherosclerosis of aorta: Secondary | ICD-10-CM | POA: Diagnosis not present

## 2020-05-09 DIAGNOSIS — K439 Ventral hernia without obstruction or gangrene: Secondary | ICD-10-CM | POA: Insufficient documentation

## 2020-05-09 MED ORDER — IOHEXOL 300 MG/ML  SOLN
75.0000 mL | Freq: Once | INTRAMUSCULAR | Status: AC | PRN
  Administered 2020-05-09: 75 mL via INTRAVENOUS

## 2020-05-16 ENCOUNTER — Encounter: Payer: Self-pay | Admitting: Urology

## 2020-05-16 ENCOUNTER — Other Ambulatory Visit: Payer: Self-pay

## 2020-05-16 ENCOUNTER — Ambulatory Visit (INDEPENDENT_AMBULATORY_CARE_PROVIDER_SITE_OTHER): Payer: Medicare Other | Admitting: Urology

## 2020-05-16 VITALS — BP 118/73 | HR 88 | Ht 69.0 in | Wt 180.0 lb

## 2020-05-16 DIAGNOSIS — Z85528 Personal history of other malignant neoplasm of kidney: Secondary | ICD-10-CM

## 2020-05-16 DIAGNOSIS — Z8546 Personal history of malignant neoplasm of prostate: Secondary | ICD-10-CM

## 2020-05-16 NOTE — Progress Notes (Signed)
05/16/2020 10:12 AM   Frank Bartlett 02/17/1926 [id number]  Referring provider: Venia Carbon, MD [address] [place],  [place] [address]  Chief Complaint  Patient presents with  . Follow-up    Urologic History 1. History of renal cell carcinoma - Laparoscopic hand-assisted left radical nephrectomy 47/01/6282.  - Complicated by c. Diff post-op - T3a Renal Cell Carcinoma, chromophobe type (03/2016). Tumor invasing renal vein and collecting system. Negative margins  2. History prostate cancer -He underwent brachytherapyin 2001.  -Undetectable PSA- last one Dec 2018.   HPI: 87 y.o. male presents for annual follow-up.   No significant urologic changes since last years visit  Mild urinary urgency  Notes occasional right hemiscrotal pain  Denies flank, abdominal or pelvic pain  No dysuria or gross hematuria  CT chest/abdomen/pelvis earlier this month showed no evidence of recurrent disease  Labs 03/24/2020 remarkable for creatinine 1.63/GFR 42, hepatic function panel without abnormalities; H/H 12.6/37.8  Was admitted [facility] late December for small bowel obstruction which resolved   PMH: Past Medical History:  Diagnosis Date  . AAA (abdominal aortic aneurysm) ([place])   . Actinic keratosis   . Aortic atherosclerosis ([place])   . Atrial fibrillation ([place]) 03/2016   brief spell  . B12 deficiency   . Basal cell carcinoma 10/30/2016   L lat crown  . Bradycardia   . Bradycardia 02/2017   Pacer placed  . Chronic kidney disease (CKD), stage III (moderate) (HCC)   . Hyperlipidemia   . Hypertension   . Hypothyroidism   . Osteoarthritis   . Prostate cancer ([place]) 2001   Rad tx's + seed implants  . Renal cancer, left ([place]) 03/2016   Left Renal Nephrectomy  . Sick sinus syndrome [facility])    Pacemaker    Surgical History: Past Surgical History:  Procedure Laterality Date  . CATARACT EXTRACTION, BILATERAL    . COLONOSCOPY    . EXCISIONAL  HEMORRHOIDECTOMY    . INSERTION PROSTATE RADIATION SEED    . LAPAROSCOPIC NEPHRECTOMY, HAND ASSISTED Left 03/08/2016   Procedure: HAND ASSISTED LAPAROSCOPIC NEPHRECTOMY;  Surgeon: Nickie Retort, MD;  Location: [facility] ORS;  Service: Urology;  Laterality: Left;  . LAPAROTOMY N/A 03/13/2016   Procedure: EXPLORATORY LAPAROTOMY;  Surgeon: Festus Aloe, MD;  Location: [facility] ORS;  Service: Urology;  Laterality: N/A;  . PACEMAKER INSERTION N/A 02/20/2017   Procedure: INSERTION PACEMAKER;  Surgeon: Isaias Cowman, MD;  Location: [facility] ORS;  Service: Cardiovascular;  Laterality: N/A;    Home Medications:  Allergies as of 05/16/2020   No Known Allergies     Medication List       Accurate as of May 16, 2020 10:12 AM. If you have any questions, ask your nurse or doctor.        acetaminophen 500 MG tablet Commonly known as: TYLENOL Take 1,000 mg by mouth daily. And 1000mg  twice a day prn   amLODipine 10 MG tablet Commonly known as: NORVASC Take 1 tablet (10 mg total) by mouth daily.   aspirin EC 81 MG tablet Take 81 mg by mouth daily.   Cyanocobalamin 1000 MCG Tbcr Take 1,000 mcg by mouth daily.   docusate sodium 100 MG capsule Commonly known as: COLACE Take 100 mg by mouth daily.   levothyroxine 112 MCG tablet Commonly known as: SYNTHROID Take 112 mcg by mouth daily before breakfast.   lisinopril 20 MG tablet Commonly known as: ZESTRIL Take 20 mg by mouth daily.   Multi-Vitamins Tabs Take 1 tablet  by mouth daily.   phenol 1.4 % Liqd Commonly known as: CHLORASEPTIC Use as directed 1 spray in the mouth or throat as needed for throat irritation / pain.   Procto-Med HC 2.5 % rectal cream Generic drug: hydrocortisone Apply 1 application topically as directed.   sertraline 50 MG tablet Commonly known as: ZOLOFT Take 50 mg by mouth daily.       Allergies: No Known Allergies  Family History: Family History  Problem Relation Age of Onset  . Hypertension  Mother   . Breast cancer Mother   . Colon cancer Father   . Prostate cancer Neg Hx   . Bladder Cancer Neg Hx   . Kidney cancer Neg Hx     Social History:  reports that he has never smoked. He has never used smokeless tobacco. He reports that he does not drink alcohol and does not use drugs.   Physical Exam: There were no vitals taken for this visit.  Constitutional:  Alert and oriented, No acute distress. HEENT: [place] AT, moist mucus membranes.  Trachea midline, no masses. Cardiovascular: No clubbing, cyanosis, or edema. Respiratory: Normal respiratory effort, no increased work of breathing. Skin: No rashes, bruises or suspicious lesions. Neurologic: Grossly intact, no focal deficits, moving all 4 extremities. Psychiatric: Normal mood and affect.   Assessment & Plan:    1.  History renal cell carcinoma  Stable imaging without evidence of recurrent disease  Regular imaging no longer required and can be obtained on an as-needed basis  Offered him annual follow-up or as needed referral for any symptoms including hematuria or changes in his annual blood work.  He would like to return prn   Abbie Sons, MD  [facility] [address], [place], [place] [address] [phone]

## 2020-06-06 DIAGNOSIS — K5909 Other constipation: Secondary | ICD-10-CM | POA: Diagnosis not present

## 2020-06-06 DIAGNOSIS — Z923 Personal history of irradiation: Secondary | ICD-10-CM | POA: Diagnosis not present

## 2020-07-19 DIAGNOSIS — I495 Sick sinus syndrome: Secondary | ICD-10-CM | POA: Diagnosis not present

## 2020-07-28 ENCOUNTER — Other Ambulatory Visit: Payer: Self-pay

## 2020-07-28 ENCOUNTER — Ambulatory Visit: Payer: Medicare Other | Admitting: Dermatology

## 2020-07-28 ENCOUNTER — Encounter: Payer: Self-pay | Admitting: Internal Medicine

## 2020-07-28 ENCOUNTER — Ambulatory Visit: Payer: Medicare Other | Admitting: Internal Medicine

## 2020-07-28 VITALS — BP 109/70 | HR 92 | Resp 18 | Wt 177.2 lb

## 2020-07-28 DIAGNOSIS — I495 Sick sinus syndrome: Secondary | ICD-10-CM | POA: Diagnosis not present

## 2020-07-28 DIAGNOSIS — I714 Abdominal aortic aneurysm, without rupture, unspecified: Secondary | ICD-10-CM

## 2020-07-28 DIAGNOSIS — N1831 Chronic kidney disease, stage 3a: Secondary | ICD-10-CM | POA: Diagnosis not present

## 2020-07-28 DIAGNOSIS — I10 Essential (primary) hypertension: Secondary | ICD-10-CM | POA: Diagnosis not present

## 2020-07-28 DIAGNOSIS — F39 Unspecified mood [affective] disorder: Secondary | ICD-10-CM

## 2020-07-28 DIAGNOSIS — I700 Atherosclerosis of aorta: Secondary | ICD-10-CM | POA: Diagnosis not present

## 2020-07-28 NOTE — Assessment & Plan Note (Signed)
Maximal size 3.6cm on most recent CT Unlikely to require action---can hold off on regular screening

## 2020-07-28 NOTE — Assessment & Plan Note (Signed)
Has anxiety mostly This has been controlled with very low dose sertraline Will continue for now

## 2020-07-28 NOTE — Assessment & Plan Note (Signed)
Has pacemaker Is due for pacer check soon--he will be going to the office

## 2020-07-28 NOTE — Assessment & Plan Note (Signed)
BP Readings from Last 3 Encounters:  07/28/20 109/70  05/16/20 118/73  05/05/20 116/75   Good control on amlodipine and lisinopril  Will cut the amlodpine

## 2020-07-28 NOTE — Assessment & Plan Note (Signed)
GFR improved on lisinopril

## 2020-07-28 NOTE — Progress Notes (Signed)
Subjective:    Patient ID: Frank Bartlett, male    DOB: [birth year], 87 y.o.   MRN: [id number]  HPI Visit in assisted living apartment for review of chronic health conditions Reviewed status with Luellen Pucker RN  Hospitalized in [facility] Resolved with NG tube and time Notes some issues with fecal incontinence---"it just keeps coming" Benefiber has helped--but GI noticed some weak sphincter  Anxiety seems to be controlled Continues on the very low dose of the sertraline No sig shakiness now Not depressed  Walks daily --uses rollator Independent with ADLs still  No chest pain No SOB No dizziness or syncope  Known aortic atherosclerosis and AAA seen on surveillance CT for past renal cancer No action about this  Last GFR improved--up to 47  Needs evaluation for the pacemaker Not sure if battery life is running out  Current Outpatient Medications on File Prior to Visit  Medication Sig Dispense Refill  . acetaminophen (TYLENOL) 500 MG tablet Take 1,000 mg by mouth daily. And 1000mg  twice a day prn    . amLODipine (NORVASC) 10 MG tablet Take 1 tablet (10 mg total) by mouth daily. 90 tablet 3  . aspirin EC 81 MG tablet Take 81 mg by mouth daily.     . Cyanocobalamin 1000 MCG TBCR Take 1,000 mcg by mouth daily.     Marland Kitchen docusate sodium (COLACE) 100 MG capsule Take 100 mg by mouth daily.    Marland Kitchen levothyroxine (SYNTHROID, LEVOTHROID) 112 MCG tablet Take 112 mcg by mouth daily before breakfast.    . lisinopril (PRINIVIL,ZESTRIL) 20 MG tablet Take 20 mg by mouth daily.    . Multiple Vitamin (MULTI-VITAMINS) TABS Take 1 tablet by mouth daily.    Marland Kitchen PROCTO-MED HC 2.5 % rectal cream Apply 1 application topically as directed.    . sertraline (ZOLOFT) 50 MG tablet Take 25 mg by mouth daily.    . Wheat Dextrin (BENEFIBER) POWD Take 30 mLs by mouth daily.     No current facility-administered medications on file prior to visit.    No Known Allergies  Past Medical History:   Diagnosis Date  . AAA (abdominal aortic aneurysm) ([place])   . Actinic keratosis   . Aortic atherosclerosis ([place])   . Atrial fibrillation ([place]) 03/2016   brief spell  . B12 deficiency   . Basal cell carcinoma 10/30/2016   L lat crown  . Bradycardia   . Bradycardia 02/2017   Pacer placed  . Chronic kidney disease (CKD), stage III (moderate) (HCC)   . Hyperlipidemia   . Hypertension   . Hypothyroidism   . Osteoarthritis   . Prostate cancer ([place]) 2001   Rad tx's + seed implants  . Renal cancer, left ([place]) 03/2016   Left Renal Nephrectomy  . Sick sinus syndrome [facility])    Pacemaker    Past Surgical History:  Procedure Laterality Date  . CATARACT EXTRACTION, BILATERAL    . COLONOSCOPY    . EXCISIONAL HEMORRHOIDECTOMY    . INSERTION PROSTATE RADIATION SEED    . LAPAROSCOPIC NEPHRECTOMY, HAND ASSISTED Left 03/08/2016   Procedure: HAND ASSISTED LAPAROSCOPIC NEPHRECTOMY;  Surgeon: Nickie Retort, MD;  Location: [facility] ORS;  Service: Urology;  Laterality: Left;  . LAPAROTOMY N/A 03/13/2016   Procedure: EXPLORATORY LAPAROTOMY;  Surgeon: Festus Aloe, MD;  Location: [facility] ORS;  Service: Urology;  Laterality: N/A;  . PACEMAKER INSERTION N/A 02/20/2017   Procedure: INSERTION PACEMAKER;  Surgeon: Isaias Cowman, MD;  Location: [facility] ORS;  Service: Cardiovascular;  Laterality:  N/A;    Family History  Problem Relation Age of Onset  . Hypertension Mother   . Breast cancer Mother   . Colon cancer Father   . Prostate cancer Neg Hx   . Bladder Cancer Neg Hx   . Kidney cancer Neg Hx     Social History   Socioeconomic History  . Marital status: Widowed    Spouse name: Not on file  . Number of children: Not on file  . Years of education: Not on file  . Highest education level: Not on file  Occupational History  . Occupation: Comptroller/accountant    Comment: Retired  Tobacco Use  . Smoking status: Never Smoker  . Smokeless tobacco: Never Used  Substance and Sexual  Activity  . Alcohol use: No    Alcohol/week: 0.0 standard drinks    Comment: rare wine  . Drug use: No  . Sexual activity: Not Currently  Other Topics Concern  . Not on file  Social History Narrative   Widowed 1/18   3 children      Has living will   Son [name] is [facility] POA   Has DNR   No tube feeds if cognitively unaware   Social Determinants of Health   Financial Resource Strain: Not on file  Food Insecurity: Not on file  Transportation Needs: Not on file  Physical Activity: Not on file  Stress: Not on file  Social Connections: Not on file  Intimate Partner Violence: Not on file   Review of Systems Appetite hasn't been the same as before C dif infection some years ago Has lost a few pounds--but he is exercising more (weights, situps, etc)    Objective:   Physical Exam Constitutional:      Appearance: Normal appearance.  Cardiovascular:     Rate and Rhythm: Normal rate and regular rhythm.     Heart sounds: No murmur heard. No gallop.   Pulmonary:     Effort: Pulmonary effort is normal.     Breath sounds: Normal breath sounds. No wheezing or rales.  Abdominal:     General: Bowel sounds are normal.     Palpations: Abdomen is soft.     Tenderness: There is no abdominal tenderness.  Musculoskeletal:     Cervical back: Neck supple.     Right lower leg: No edema.     Left lower leg: No edema.  Lymphadenopathy:     Cervical: No cervical adenopathy.  Neurological:     Mental Status: He is alert.  Psychiatric:        Mood and Affect: Mood normal.        Behavior: Behavior normal.            Assessment & Plan:

## 2020-07-28 NOTE — Assessment & Plan Note (Signed)
Incidental finding on CT scans No action given his age

## 2020-08-03 DIAGNOSIS — Z950 Presence of cardiac pacemaker: Secondary | ICD-10-CM | POA: Diagnosis not present

## 2020-08-03 DIAGNOSIS — I495 Sick sinus syndrome: Secondary | ICD-10-CM | POA: Diagnosis not present

## 2020-08-03 DIAGNOSIS — I4892 Unspecified atrial flutter: Secondary | ICD-10-CM | POA: Diagnosis not present

## 2020-08-03 DIAGNOSIS — Z79899 Other long term (current) drug therapy: Secondary | ICD-10-CM | POA: Diagnosis not present

## 2020-08-31 DIAGNOSIS — Z950 Presence of cardiac pacemaker: Secondary | ICD-10-CM | POA: Diagnosis not present

## 2020-08-31 DIAGNOSIS — N1831 Chronic kidney disease, stage 3a: Secondary | ICD-10-CM | POA: Diagnosis not present

## 2020-08-31 DIAGNOSIS — R001 Bradycardia, unspecified: Secondary | ICD-10-CM | POA: Diagnosis not present

## 2020-08-31 DIAGNOSIS — I10 Essential (primary) hypertension: Secondary | ICD-10-CM | POA: Diagnosis not present

## 2020-08-31 DIAGNOSIS — I4892 Unspecified atrial flutter: Secondary | ICD-10-CM | POA: Diagnosis not present

## 2020-08-31 DIAGNOSIS — E7800 Pure hypercholesterolemia, unspecified: Secondary | ICD-10-CM | POA: Diagnosis not present

## 2020-08-31 DIAGNOSIS — I495 Sick sinus syndrome: Secondary | ICD-10-CM | POA: Diagnosis not present

## 2020-09-22 DIAGNOSIS — E039 Hypothyroidism, unspecified: Secondary | ICD-10-CM | POA: Diagnosis not present

## 2020-10-28 ENCOUNTER — Other Ambulatory Visit: Payer: Self-pay

## 2020-10-28 ENCOUNTER — Non-Acute Institutional Stay: Payer: Medicare Other | Admitting: Internal Medicine

## 2020-10-28 DIAGNOSIS — M1711 Unilateral primary osteoarthritis, right knee: Secondary | ICD-10-CM

## 2020-10-28 DIAGNOSIS — E7800 Pure hypercholesterolemia, unspecified: Secondary | ICD-10-CM | POA: Diagnosis not present

## 2020-10-28 DIAGNOSIS — N1832 Chronic kidney disease, stage 3b: Secondary | ICD-10-CM

## 2020-10-28 DIAGNOSIS — I714 Abdominal aortic aneurysm, without rupture, unspecified: Secondary | ICD-10-CM

## 2020-10-28 DIAGNOSIS — F39 Unspecified mood [affective] disorder: Secondary | ICD-10-CM

## 2020-10-28 DIAGNOSIS — I10 Essential (primary) hypertension: Secondary | ICD-10-CM

## 2020-10-28 DIAGNOSIS — E039 Hypothyroidism, unspecified: Secondary | ICD-10-CM

## 2020-10-28 DIAGNOSIS — I700 Atherosclerosis of aorta: Secondary | ICD-10-CM

## 2020-10-28 DIAGNOSIS — I4892 Unspecified atrial flutter: Secondary | ICD-10-CM

## 2020-11-02 ENCOUNTER — Encounter: Payer: Self-pay | Admitting: Internal Medicine

## 2020-11-02 NOTE — Assessment & Plan Note (Signed)
Currently not on statin therapy given age Will monitor

## 2020-11-02 NOTE — Assessment & Plan Note (Signed)
Kidney function reviewed Continue lisinopril for renal protection 

## 2020-11-02 NOTE — Progress Notes (Signed)
Subjective:    Patient ID: Frank Bartlett, male    DOB: 03/25/1926, 87 y.o.   MRN: [id number]  HPI  Resident seen in [address] RN reports persistent bowel issues.  Resident reports he often has rectal leakage after a BM.  He reports some intermittent right lower quadrant pain that is only tender to touch.  He has not noticed any blood in the stool.  Otherwise he reports he is sleeping well.  His appetite is poor because he is scared that it will cause worsening of the bowel issue.  He is independent with ADLs, walks with a walker.  He reports some urinary frequency and incontinence.  He denies pain.  He reports his mood is not good, he is often anxious and hesitant to engage in activities because of his urinary and bowel issues.  He does have a girlfriend which has been good for him but reports he hesitates to do things with her because of these issues.  HTN: His BP today is 132/89.  He is taking Amlodipine and Lisinopril as prescribed.  Hypothyroid: He denies any issues on his current dose of Levothyroxine.  Mood disorder: Persistent anxiety.  He is taking Sertraline as prescribed.  A. fib: Managed on Eliquis.  OA: Mainly in his knees.  He takes Tylenol as prescribed with good relief of symptoms.  CKD: His last creatinine was 1.7, GFR 38.  He is on Lisinopril for renal protection.  HLD with AAA/Aortic Atherosclerosis: He is not currently on cholesterol-lowering medication.  He is taking aspirin daily.  Anemia: His last H/H was 12.1/36.7.  He is not currently taking oral iron supplement at this time.  Review of Systems     Past Medical History:  Diagnosis Date   AAA (abdominal aortic aneurysm) ([place])    Actinic keratosis    Aortic atherosclerosis (H[facility])    Atrial fibrillation ([place]) 03/2016   brief spell   B12 deficiency    Basal cell carcinoma 10/30/2016   L lat crown   Bradycardia    Bradycardia 02/2017   Pacer placed   Chronic kidney disease (CKD), stage III (moderate)  (HCC)    Hyperlipidemia    Hypertension    Hypothyroidism    Osteoarthritis    Prostate cancer ([place]) 2001   Rad tx's + seed implants   Renal cancer, left ([place]) 03/2016   Left Renal Nephrectomy   Sick sinus syndrome (HCC)    Pacemaker    Current Outpatient Medications  Medication Sig Dispense Refill   acetaminophen (TYLENOL) 500 MG tablet Take 1,000 mg by mouth daily. And 1000mg  twice a day prn     amLODipine (NORVASC) 5 MG tablet Take 5 mg by mouth daily.     aspirin EC 81 MG tablet Take 81 mg by mouth daily.      Cyanocobalamin 1000 MCG TBCR Take 1,000 mcg by mouth daily.      docusate sodium (COLACE) 100 MG capsule Take 100 mg by mouth daily.     levothyroxine (SYNTHROID, LEVOTHROID) 112 MCG tablet Take 112 mcg by mouth daily before breakfast.     lisinopril (PRINIVIL,ZESTRIL) 20 MG tablet Take 20 mg by mouth daily.     Multiple Vitamin (MULTI-VITAMINS) TABS Take 1 tablet by mouth daily.     PROCTO-MED HC 2.5 % rectal cream Apply 1 application topically as directed.     sertraline (ZOLOFT) 50 MG tablet Take 25 mg by mouth daily.     Wheat Dextrin (BENEFIBER) POWD Take 30  mLs by mouth daily.     No current facility-administered medications for this visit.    No Known Allergies  Family History  Problem Relation Age of Onset   Hypertension Mother    Breast cancer Mother    Colon cancer Father    Prostate cancer Neg Hx    Bladder Cancer Neg Hx    Kidney cancer Neg Hx     Social History   Socioeconomic History   Marital status: Widowed    Spouse name: Not on file   Number of children: Not on file   Years of education: Not on file   Highest education level: Not on file  Occupational History   Occupation: Comptroller/accountant    Comment: Retired  Tobacco Use   Smoking status: Never   Smokeless tobacco: Never  Substance and Sexual Activity   Alcohol use: No    Alcohol/week: 0.0 standard drinks    Comment: rare wine   Drug use: No   Sexual activity: Not  Currently  Other Topics Concern   Not on file  Social History Narrative   Widowed 1/18   3 children      Has living will   Son [name] is [facility] POA   Has DNR   No tube feeds if cognitively unaware   Social Determinants of Health   Financial Resource Strain: Not on file  Food Insecurity: Not on file  Transportation Needs: Not on file  Physical Activity: Not on file  Stress: Not on file  Social Connections: Not on file  Intimate Partner Violence: Not on file     Constitutional: Denies fever, malaise, fatigue, headache or abrupt weight changes.  HEENT: Denies eye pain, eye redness, ear pain, ringing in the ears, wax buildup, runny nose, nasal congestion, bloody nose, or sore throat. Respiratory: Denies difficulty breathing, shortness of breath, cough or sputum production.   Cardiovascular: Denies chest pain, chest tightness, palpitations or swelling in the hands or feet.  Gastrointestinal: Patient reports rectal leakage.  Denies abdominal pain, bloating, constipation, diarrhea or blood in the stool.  GU: Patient reports urinary frequency and incontinence.  Denies urgency, pain with urination, burning sensation, blood in urine, odor or discharge. Musculoskeletal: Patient reports intermittent knee pain.  Denies decrease in range of motion, difficulty with gait, muscle pain or joint swelling.  Skin: Denies redness, rashes, lesions or ulcercations.  Neurological: Denies dizziness, difficulty with memory, difficulty with speech or problems with balance and coordination.  Psych: Patient has a history of anxiety depression.  Denies SI/HI.  No other specific complaints in a complete review of systems (except as listed in HPI above).  Objective:   Physical Exam  BP 132/89   Pulse 70   Temp 98.1 F (36.7 C)   Resp 13   Wt 173 lb 12.8 oz (78.8 kg)   BMI 25.67 kg/m  Wt Readings from Last 3 Encounters:  11/02/20 173 lb 12.8 oz (78.8 kg)  07/28/20 177 lb 3.2 oz (80.4 kg)  05/16/20 180 lb  (81.6 kg)    General: Appears his stated age, well developed, well nourished in NAD. Skin: Warm, dry and intact.  Neck:  Neck supple, trachea midline. No masses, lumps or thyromegaly present.  Cardiovascular: Normal rate and rhythm. S1,S2 noted.  No murmur, rubs or gallops noted. No JVD or BLE edema.  Pulmonary/Chest: Normal effort and positive vesicular breath sounds. No respiratory distress. No wheezes, rales or ronchi noted.  Abdomen: Soft and nontender. Normal bowel sounds. Hernia noted.  Neurological: Alert and oriented.  Psychiatric: Mildly anxious appearing.  BMET    Component Value Date/Time   NA 135 04/28/2020 0522   NA 133 (A) 10/02/2018 0000   K 4.3 04/28/2020 0522   CL 105 04/28/2020 0522   CO2 22 04/28/2020 0522   GLUCOSE 115 (H) 04/28/2020 0522   BUN 24 (H) 04/28/2020 0522   BUN 24 10/11/2017 1004   CREATININE 1.41 (H) 04/28/2020 0522   CALCIUM 8.2 (L) 04/28/2020 0522   GFRNONAA 47 (L) 04/28/2020 0522   GFRAA 50 (L) 10/11/2017 1004    Lipid Panel  No results found for: CHOL, TRIG, HDL, CHOLHDL, VLDL, LDLCALC  CBC    Component Value Date/Time   WBC 6.2 04/28/2020 0522   RBC 3.26 (L) 04/28/2020 0522   HGB 10.7 (L) 04/28/2020 0522   HGB 11.0 (L) 04/17/2017 1031   HCT 31.5 (L) 04/28/2020 0522   HCT 33.9 (L) 04/17/2017 1031   PLT 217 04/28/2020 0522   PLT 293 04/17/2017 1031   MCV 96.6 04/28/2020 0522   MCV 94 04/17/2017 1031   MCH 32.8 04/28/2020 0522   MCHC 34.0 04/28/2020 0522   RDW 13.0 04/28/2020 0522   RDW 14.1 04/17/2017 1031   LYMPHSABS 0.5 (L) 03/12/2016 2216   MONOABS 0.6 03/12/2016 2216   EOSABS 0.1 03/12/2016 2216   BASOSABS 0.1 03/12/2016 2216    Hgb A1C No results found for: HGBA1C          Assessment & Plan:

## 2020-11-02 NOTE — Assessment & Plan Note (Signed)
-   Continue Eliquis 

## 2020-11-02 NOTE — Assessment & Plan Note (Signed)
Controlled on amlodipine and lisinopril Will monitor

## 2020-11-02 NOTE — Assessment & Plan Note (Signed)
Free T4 reviewed Continue levothyroxine

## 2020-11-02 NOTE — Assessment & Plan Note (Signed)
Will increase sertraline to 75 mg daily Support offered

## 2020-11-02 NOTE — Patient Instructions (Signed)
Fecal Incontinence Fecal incontinence, also called accidental bowel leakage, is not being able to control your bowels. This condition happens because the nerves or muscles around the anus do not work the way they should. This affects their ability to hold stool (feces). What are the causes? This condition may be caused by: Damage to the muscles at the end of the rectum (sphincter). Damage to the nerves that control bowel movements. Diarrhea. Chronic constipation. Pelvic floor dysfunction. This means the muscles in the pelvis do not work well. Loss of bowel storage capacity. This occurs when the rectum can no longer stretch in size in order to store feces. Inflammatory bowel disease (IBD), such as Crohn's disease. Irritable bowel syndrome (IBS). What increases the risk? You are more likely to develop this condition if you: Were born with bowels or a pelvis that did not form correctly. Have had rectal surgery. Have had radiation treatment for certain cancers. Have been pregnant, had a vaginal delivery, or had surgery that damaged the pelvic floor muscles. Had a complicated childbirth, spinal cord injury, or other trauma that caused nerve damage. Have a condition that can affect nerve function, such as diabetes, Parkinson's disease, or multiple sclerosis. Have a condition where the rectum drops down into the anus or vagina (prolapse). Are 82 years of age or older. What are the signs or symptoms? The main symptom of this condition is not being able to control your bowels.You also might not be able to get to the bathroom before a bowel movement. How is this diagnosed? This condition is diagnosed with a medical history and physical exam. You may also have other tests, including: Blood tests. Urine tests. A rectal exam. Ultrasound. MRI. Colonoscopy. This is an exam that looks at your large intestine (colon). Anal manometry. This is a test that measures the strength of the anal  sphincter. Anal electromyogram (EMG). This is a test that uses small electrodes to check for nerve damage. How is this treated? Treatment for this condition depends on the cause and severity. Treatment may also focus on addressing any underlying causes of this condition. Treatment may include: Medicines. This may include medicines to: Prevent diarrhea. Help with constipation (bulk-forming laxatives). Treat any underlying conditions. Biofeedback therapy. This can help to retrain muscles that are affected. Fiber supplements. These can help manage your bowel movements. Nerve stimulation. Injectable gel to promote tissue growth and better muscle control. Surgery. You may need: Sphincter repair surgery. Diversion surgery. This procedure lets feces pass out of your body through a hole in your abdomen. Follow these instructions at home: Eating and drinking  Follow instructions from your health care provider about any eating or drinking restrictions. Work with a dietitian to come up with a healthy diet that will help you avoid the foods that can make your condition worse. Keep a diet diary to find out which foods or drinks could be making your condition worse. Drink enough fluid to keep your urine pale yellow.  Lifestyle Do not use any products that contain nicotine or tobacco, such as cigarettes and e-cigarettes. If you need help quitting, ask your health care provider. This may help your condition. If you are overweight, talk with your health care provider about how to safely lose weight. This may help your condition. Increase your physical activity as told by your health care provider. This may help your condition. Always talk with your health care provider before starting a new exercise program. Carry a change of clothes and supplies to clean up  quickly if you have an episode of fetal incontinence. Consider joining a fecal incontinence support group. You can find a support group online or in  your local community. General instructions  Take over-the-counter and prescription medicines only as told by your health care provider. This includes any supplements. Apply a moisture barrier, such as petroleum jelly, to your rectum. This protects the skin from irritation caused by ongoing leaking or diarrhea. Tell your health care provider if you are upset or depressed about your condition. Keep all follow-up visits as told by your health care provider. This is important.  Where to find more information [employer] Gastrointestinal Disorders: iffgd.[place] [facility] G[facility]: patients.gi.org Contact a health care provider if: You have a fever. You have redness, swelling, or pain around your rectum. Your pain is getting worse or you lose feeling in your rectal area. You have blood in your stool. You feel sad or hopeless. You avoid social or work situations. Get help right away if: You stop having bowel movements. You cannot eat or drink without vomiting. You have rectal bleeding that does not stop. You have severe pain that is getting worse. You have symptoms of dehydration, including: Sleepiness or fatigue. Producing little or no urine, tears, or sweat. Dizziness. Dry mouth. Unusual irritability. Headache. Inability to think clearly. Summary Fecal incontinence, also called accidental bowel leakage, is not being able to control your bowels. This condition happens because the nerves or muscles around the anus do not work the way they should. Treatment varies depending on the cause and severity of your condition. Treatment may also focus on addressing any underlying causes of this condition. Follow instructions from your health care provider about any eating or drinking restrictions, lifestyle changes, and skin care. Take over-the-counter and prescription medicines only as told by your health care provider. This includes any  supplements. Tell your health care provider if your symptoms worsen or if you are upset or depressed about your condition. This information is not intended to replace advice given to you by your health care provider. Make sure you discuss any questions you have with your healthcare provider. Document Revised: 09/05/2017 Document Reviewed: 09/05/2017 Elsevier Patient Education  2022 [employer].

## 2020-11-02 NOTE — Assessment & Plan Note (Signed)
Continue daily aspirin 

## 2020-11-02 NOTE — Assessment & Plan Note (Signed)
Continue Tylenol

## 2020-11-24 ENCOUNTER — Ambulatory Visit: Payer: Medicare Other | Admitting: Dermatology

## 2020-11-24 ENCOUNTER — Other Ambulatory Visit: Payer: Self-pay

## 2020-11-24 DIAGNOSIS — L578 Other skin changes due to chronic exposure to nonionizing radiation: Secondary | ICD-10-CM | POA: Diagnosis not present

## 2020-11-24 DIAGNOSIS — L820 Inflamed seborrheic keratosis: Secondary | ICD-10-CM

## 2020-11-24 DIAGNOSIS — L570 Actinic keratosis: Secondary | ICD-10-CM | POA: Diagnosis not present

## 2020-11-24 DIAGNOSIS — L821 Other seborrheic keratosis: Secondary | ICD-10-CM

## 2020-11-24 NOTE — Patient Instructions (Signed)

## 2020-11-24 NOTE — Progress Notes (Signed)
   Follow-Up Visit   Subjective  Frank Bartlett is a 87 y.o. male who presents for the following: Actinic Keratosis (Face, scalp, ears, >70m f/u/) and ISK f/u (Scalp, >14m f/u).  The following portions of the chart were reviewed this encounter and updated as appropriate:   Tobacco  Allergies  Meds  Problems  Med Hx  Surg Hx  Fam Hx     Review of Systems:  No other skin or systemic complaints except as noted in HPI or Assessment and Plan.  Objective  Well appearing patient in no apparent distress; mood and affect are within normal limits.  A focused examination was performed including face, scalp, ears. Relevant physical exam findings are noted in the Assessment and Plan.  R mid vertex scalp x 1, R crown x 1, L ear x 3, L ear tragus x 1, L face x 6 (12) Pink scaly macules   R face x 1, Total = 1 Erythematous keratotic or waxy stuck-on papule or plaque.    Assessment & Plan  AK (actinic keratosis) (12) R mid vertex scalp x 1, R crown x 1, L ear x 3, L ear tragus x 1, L face x 6  Destruction of lesion - R mid vertex scalp x 1, R crown x 1, L ear x 3, L ear tragus x 1, L face x 6 Complexity: simple   Destruction method: cryotherapy   Informed consent: discussed and consent obtained   Timeout:  patient name, date of birth, surgical site, and procedure verified Lesion destroyed using liquid nitrogen: Yes   Region frozen until ice ball extended beyond lesion: Yes   Outcome: patient tolerated procedure well with no complications   Post-procedure details: wound care instructions given    Inflamed seborrheic keratosis R face x 1, Total = 1  Destruction of lesion - R face x 1, Total = 1 Complexity: simple   Destruction method: cryotherapy   Informed consent: discussed and consent obtained   Timeout:  patient name, date of birth, surgical site, and procedure verified Lesion destroyed using liquid nitrogen: Yes   Region frozen until ice ball extended beyond lesion: Yes    Outcome: patient tolerated procedure well with no complications   Post-procedure details: wound care instructions given    Seborrheic Keratoses - Stuck-on, waxy, tan-brown papules and/or plaques  - Benign-appearing - Discussed benign etiology and prognosis. - Observe - Call for any changes  Actinic Damage - chronic, secondary to cumulative UV radiation exposure/sun exposure over time - diffuse scaly erythematous macules with underlying dyspigmentation - Recommend daily broad spectrum sunscreen SPF 30+ to sun-exposed areas, reapply every 2 hours as needed.  - Recommend staying in the shade or wearing long sleeves, sun glasses (UVA+UVB protection) and wide brim hats (4-inch brim around the entire circumference of the hat). - Call for new or changing lesions.   Return in about 3 months (around 02/24/2021) for Hx of AKs, ISK f/u.  I, Othelia Pulling, RMA, am acting as scribe for Sarina Ser, MD . Documentation: I have reviewed the above documentation for accuracy and completeness, and I agree with the above.  Sarina Ser, MD

## 2020-11-26 ENCOUNTER — Encounter: Payer: Self-pay | Admitting: Dermatology

## 2020-12-13 DIAGNOSIS — E7800 Pure hypercholesterolemia, unspecified: Secondary | ICD-10-CM | POA: Diagnosis not present

## 2020-12-13 DIAGNOSIS — I10 Essential (primary) hypertension: Secondary | ICD-10-CM | POA: Diagnosis not present

## 2020-12-13 DIAGNOSIS — N1831 Chronic kidney disease, stage 3a: Secondary | ICD-10-CM | POA: Diagnosis not present

## 2020-12-13 DIAGNOSIS — I495 Sick sinus syndrome: Secondary | ICD-10-CM | POA: Diagnosis not present

## 2020-12-13 DIAGNOSIS — R001 Bradycardia, unspecified: Secondary | ICD-10-CM | POA: Diagnosis not present

## 2020-12-13 DIAGNOSIS — I4892 Unspecified atrial flutter: Secondary | ICD-10-CM | POA: Diagnosis not present

## 2020-12-13 DIAGNOSIS — Z950 Presence of cardiac pacemaker: Secondary | ICD-10-CM | POA: Diagnosis not present

## 2021-02-02 ENCOUNTER — Other Ambulatory Visit: Payer: Self-pay

## 2021-02-02 ENCOUNTER — Ambulatory Visit: Payer: Medicare Other | Admitting: Internal Medicine

## 2021-02-02 ENCOUNTER — Encounter: Payer: Self-pay | Admitting: Internal Medicine

## 2021-02-02 DIAGNOSIS — I4892 Unspecified atrial flutter: Secondary | ICD-10-CM

## 2021-02-02 DIAGNOSIS — R159 Full incontinence of feces: Secondary | ICD-10-CM | POA: Insufficient documentation

## 2021-02-02 DIAGNOSIS — N1831 Chronic kidney disease, stage 3a: Secondary | ICD-10-CM | POA: Diagnosis not present

## 2021-02-02 DIAGNOSIS — F39 Unspecified mood [affective] disorder: Secondary | ICD-10-CM

## 2021-02-02 DIAGNOSIS — I10 Essential (primary) hypertension: Secondary | ICD-10-CM | POA: Diagnosis not present

## 2021-02-02 NOTE — Progress Notes (Signed)
Subjective:    Patient ID: Frank Bartlett, male    DOB: [birth year], 87 y.o.   MRN: [id number]  HPI Visit in assisted living apartment for review of chronic medical conditions Reviewed status with Luellen Pucker RN  Still notes problems with fecal incontinence Comes out without any warning or awareness Some urine incontinence as well---urge Wears full pull up He has gotten used to this--not bothering him as much now  Not depressed  Anxiety seems to be controlled Still in relationship--this is a good thing  No palpitations Recent pacer check okay--still noted rare atrial flutter No chest pain or SOB Tries to do some exercise daily--walking and resistance work  Labs reviewed Last GFR 47  Current Outpatient Medications on File Prior to Visit  Medication Sig Dispense Refill   acetaminophen (TYLENOL) 500 MG tablet Take 1,000 mg by mouth daily. And 1000mg  twice a day prn     amLODipine (NORVASC) 5 MG tablet Take 5 mg by mouth daily.     Cyanocobalamin 1000 MCG TBCR Take 1,000 mcg by mouth daily.      docusate sodium (COLACE) 100 MG capsule Take 100 mg by mouth daily.     ELIQUIS 2.5 MG TABS tablet Take 2.5 mg by mouth 2 (two) times daily.     levothyroxine (SYNTHROID, LEVOTHROID) 112 MCG tablet Take 112 mcg by mouth daily before breakfast.     lisinopril (ZESTRIL) 10 MG tablet Take 10 mg by mouth daily.     Multiple Vitamin (MULTI-VITAMINS) TABS Take 1 tablet by mouth daily.     PROCTO-MED HC 2.5 % rectal cream Apply 1 application topically as directed.     sertraline (ZOLOFT) 50 MG tablet Take 75 mg by mouth daily.     No current facility-administered medications on file prior to visit.    No Known Allergies  Past Medical History:  Diagnosis Date   AAA (abdominal aortic aneurysm) (HCC)    Actinic keratosis 07/22/2017   left lateral crown, midline crown, right of midline crown   Aortic atherosclerosis (HCC)    Atrial fibrillation ([place]) 03/2016   brief spell   B12 deficiency     Basal cell carcinoma 10/30/2016   L lat crown   Bradycardia    Bradycardia 02/2017   Pacer placed   Chronic kidney disease (CKD), stage III (moderate) (HCC)    Hyperlipidemia    Hypertension    Hypothyroidism    Osteoarthritis    Prostate cancer ([place]) 2001   Rad tx's + seed implants   Renal cancer, left ([place]) 03/2016   Left Renal Nephrectomy   Sick sinus syndrome ([place])    Pacemaker    Past Surgical History:  Procedure Laterality Date   CATARACT EXTRACTION, BILATERAL     COLONOSCOPY     EXCISIONAL HEMORRHOIDECTOMY     INSERTION PROSTATE RADIATION SEED     LAPAROSCOPIC NEPHRECTOMY, HAND ASSISTED Left 03/08/2016   Procedure: HAND ASSISTED LAPAROSCOPIC NEPHRECTOMY;  Surgeon: Nickie Retort, MD;  Location: [facility] ORS;  Service: Urology;  Laterality: Left;   LAPAROTOMY N/A 03/13/2016   Procedure: EXPLORATORY LAPAROTOMY;  Surgeon: Festus Aloe, MD;  Location: [facility] OR[facility];  Service: Urology;  Laterality: N/A;   PACEMAKER INSERTION N/A 02/20/2017   Procedure: INSERTION PACEMAKER;  Surgeon: Isaias Cowman, MD;  Location: [facility] OR[facility];  Service: Cardiovascular;  Laterality: N/A;    Family History  Problem Relation Age of Onset   Hypertension Mother    Breast cancer Mother    Colon cancer Father  Prostate cancer Neg Hx    Bladder Cancer Neg Hx    Kidney cancer Neg Hx     Social History   Socioeconomic History   Marital status: Widowed    Spouse name: Not on file   Number of children: Not on file   Years of education: Not on file   Highest education level: Not on file  Occupational History   Occupation: Comptroller/accountant    Comment: Retired  Tobacco Use   Smoking status: Never   Smokeless tobacco: Never  Substance and Sexual Activity   Alcohol use: No    Alcohol/week: 0.0 standard drinks    Comment: rare wine   Drug use: No   Sexual activity: Not Currently  Other Topics Concern   Not on file  Social History Narrative   Widowed 1/18   3 children       Has living will   Son [name] is [facility] POA   Has DNR   No tube feeds if cognitively unaware   Social Determinants of Health   Financial Resource Strain: Not on file  Food Insecurity: Not on file  Transportation Needs: Not on file  Physical Activity: Not on file  Stress: Not on file  Social Connections: Not on file  Intimate Partner Violence: Not on file   Review of Systems Not much appetite since C dif infection years ago Weight is stable Sleeping well--in recliner Nocturia x 2-3--gets right back to sleep Some right knee pain--not a big deal. No other sig joint issues    Objective:   Physical Exam Cardiovascular:     Rate and Rhythm: Normal rate and regular rhythm.     Heart sounds: No murmur heard.   No gallop.  Pulmonary:     Effort: Pulmonary effort is normal.     Breath sounds: Normal breath sounds. No wheezing or rales.  Abdominal:     Palpations: Abdomen is soft.     Tenderness: There is no abdominal tenderness.  Musculoskeletal:     Right lower leg: No edema.     Left lower leg: No edema.     Comments: No thickening or effusion in right knee  Lymphadenopathy:     Cervical: No cervical adenopathy.  Skin:    Coloration: Skin is not pale.  Neurological:     Mental Status: He is alert.  Psychiatric:        Mood and Affect: Mood normal.        Behavior: Behavior normal.           Assessment & Plan:

## 2021-02-02 NOTE — Assessment & Plan Note (Signed)
Asked him to stop the colace

## 2021-02-02 NOTE — Assessment & Plan Note (Signed)
Is on low dose ACEI

## 2021-02-02 NOTE — Addendum Note (Signed)
Addended by: Viviana Simpler I on: 02/02/2021 11:36 AM   Modules accepted: Orders

## 2021-02-02 NOTE — Assessment & Plan Note (Signed)
BP Readings from Last 3 Encounters:  02/02/21 136/78  11/02/20 132/89  07/28/20 109/70   Okay on lisinopril and amlodipine

## 2021-02-02 NOTE — Assessment & Plan Note (Signed)
Paced rhythm--no symptoms On eliquis

## 2021-02-02 NOTE — Assessment & Plan Note (Signed)
Still frustrated by his incontinence---but doing better with this Still on the sertraline

## 2021-03-02 ENCOUNTER — Other Ambulatory Visit: Payer: Self-pay

## 2021-03-02 ENCOUNTER — Ambulatory Visit: Payer: Medicare Other | Admitting: Dermatology

## 2021-03-02 DIAGNOSIS — L570 Actinic keratosis: Secondary | ICD-10-CM | POA: Diagnosis not present

## 2021-03-02 DIAGNOSIS — L820 Inflamed seborrheic keratosis: Secondary | ICD-10-CM

## 2021-03-02 DIAGNOSIS — L578 Other skin changes due to chronic exposure to nonionizing radiation: Secondary | ICD-10-CM | POA: Diagnosis not present

## 2021-03-02 NOTE — Patient Instructions (Addendum)
Cryotherapy Aftercare  Wash gently with soap and water everyday.   Apply Vaseline and Band-Aid daily until healed.      Levulan/PDT Treatment Common Side Effects For Face in 3-4 weeks   - Burning/stinging, which may be severe and last up to 24-72 hours after your treatment  - Redness, swelling and/or peeling which may last up to 4 weeks  - Scaling/crusting which may last up to 2 weeks  - Sun sensitivity (you MUST avoid sun exposure for 48-72 hours after treatment)  Care Instructions  - Okay to wash with soap and water and shampoo as normal  - If needed, you can do a cold compress (ex. Ice packs) for comfort  - If okay with your Primary Doctor, you may use analgesics such as Tylenol every 4-6 hours, not to exceed recommended dose  - You may apply Cerave Healing Ointment, Vaseline or Aquaphor  - If you have a lot of swelling you may take a Benadryl to help with this (this may cause drowsiness)  Sun Precautions  - Wear a wide brim hat for the next week if outside  - Wear a sunblock with zinc or titanium dioxide at least SPF 50 daily   We will recheck you in 10-12 weeks. If any problems, please call the office and ask to speak with a nurse.       If you have any questions or concerns for your doctor, please call our main line at [phone] and press option 4 to reach your doctor's medical assistant. If no one answers, please leave a voicemail as directed and we will return your call as soon as possible. Messages left after 4 pm will be answered the following business day.   You may also send [place] a message via [place]. We typically respond to MyChart messages within 1-2 business days.  For prescription refills, please ask your pharmacy to contact our office. Our fax number is [phone].  If you have an urgent issue when the clinic is closed that cannot wait until the next business day, you can page your doctor at the number below.    Please note that while we do  our best to be available for urgent issues outside of office hours, we are not available 24/7.   If you have an urgent issue and are unable to reach [place], you may choose to seek medical care at your doctor's office, retail clinic, urgent care center, or emergency room.  If you have a medical emergency, please immediately call 911 or go to the emergency department.  Pager Numbers  - Dr. Nehemiah Massed: [phone]  - Dr. Laurence Ferrari: [phone]  - Dr. Nicole Kindred: [phone]  In the event of inclement weather, please call our main line at [phone] for an update on the status of any delays or closures.  Dermatology Medication Tips: Please keep the boxes that topical medications come in in order to help keep track of the instructions about where and how to use these. Pharmacies typically print the medication instructions only on the boxes and not directly on the medication tubes.   If your medication is too expensive, please contact our office at [phone] option 4 or send [place] a message through [place].   We are unable to tell what your co-pay for medications will be in advance as this is different depending on your insurance coverage. However, we may be able to find a substitute medication at lower cost or fill out paperwork to get insurance to cover a needed medication.  If a prior authorization is required to get your medication covered by your insurance company, please allow [place] 1-2 business days to complete this process.  Drug prices often vary depending on where the prescription is filled and some pharmacies may offer cheaper prices.  The website www.goodrx.com contains coupons for medications through different pharmacies. The prices here do not account for what the cost may be with help from insurance (it may be cheaper with your insurance), but the website can give you the price if you did not use any insurance.  - You can print the associated coupon and take it with your prescription to the  pharmacy.  - You may also stop by our office during regular business hours and pick up a GoodRx coupon card.  - If you need your prescription sent electronically to a different pharmacy, notify our office through [facility] or by phone at [phone] option 4. If you have any questions or concerns for your doctor, please call our main line at [phone] and press option 4 to reach your doctor's medical assistant. If no one answers, please leave a voicemail as directed and we will return your call as soon as possible. Messages left after 4 pm will be answered the following business day.   You may also send [place] a message via [place]. We typically respond to MyChart messages within 1-2 business days.  For prescription refills, please ask your pharmacy to contact our office. Our fax number is [phone].  If you have an urgent issue when the clinic is closed that cannot wait until the next business day, you can page your doctor at the number below.    Please note that while we do our best to be available for urgent issues outside of office hours, we are not available 24/7.   If you have an urgent issue and are unable to reach [place], you may choose to seek medical care at your doctor's office, retail clinic, urgent care center, or emergency room.  If you have a medical emergency, please immediately call 911 or go to the emergency department.  Pager Numbers  - Dr. Nehemiah Massed: [phone]  - Dr. Laurence Ferrari: [phone]  - Dr. Nicole Kindred: [phone]  In the event of inclement weather, please call our main line at [phone] for an update on the status of any delays or closures.  Dermatology Medication Tips: Please keep the boxes that topical medications come in in order to help keep track of the instructions about where and how to use these. Pharmacies typically print the medication instructions only on the boxes and not directly on the medication tubes.   If your medication is too  expensive, please contact our office at [phone] option 4 or send [place] a message through [place].   We are unable to tell what your co-pay for medications will be in advance as this is different depending on your insurance coverage. However, we may be able to find a substitute medication at lower cost or fill out paperwork to get insurance to cover a needed medication.   If a prior authorization is required to get your medication covered by your insurance company, please allow [place] 1-2 business days to complete this process.  Drug prices often vary depending on where the prescription is filled and some pharmacies may offer cheaper prices.  The website www.goodrx.com contains coupons for medications through different pharmacies. The prices here do not account for what the cost may be with help from insurance (it may be cheaper  with your insurance), but the website can give you the price if you did not use any insurance.  - You can print the associated coupon and take it with your prescription to the pharmacy.  - You may also stop by our office during regular business hours and pick up a GoodRx coupon card.  - If you need your prescription sent electronically to a different pharmacy, notify our office through [facility] or by phone at [phone] option 4.

## 2021-03-02 NOTE — Progress Notes (Signed)
Follow-Up Visit   Subjective  Frank Bartlett is a 87 y.o. male who presents for the following: Actinic Keratosis (3 months f/u precancers on the face and scalp).  The following portions of the chart were reviewed this encounter and updated as appropriate:   Tobacco  Allergies  Meds  Problems  Med Hx  Surg Hx  Fam Hx     Review of Systems:  No other skin or systemic complaints except as noted in HPI or Assessment and Plan.  Objective  Well appearing patient in no apparent distress; mood and affect are within normal limits.  A focused examination was performed including face,arms . Relevant physical exam findings are noted in the Assessment and Plan.  nose x 2, right mid vertex scalp x 1, left ear x 5 (8) Erythematous thin papules/macules with gritty scale.   left hand x 17 (17) Erythematous keratotic or waxy stuck-on papule or plaque.    Assessment & Plan  AK (actinic keratosis) (8) nose x 2, right mid vertex scalp x 1, left ear x 5  Actinic keratoses are precancerous spots that appear secondary to cumulative UV radiation exposure/sun exposure over time. They are chronic with expected duration over 1 year. A portion of actinic keratoses will progress to squamous cell carcinoma of the skin. It is not possible to reliably predict which spots will progress to skin cancer and so treatment is recommended to prevent development of skin cancer.  Recommend daily broad spectrum sunscreen SPF 30+ to sun-exposed areas, reapply every 2 hours as needed.  Recommend staying in the shade or wearing long sleeves, sun glasses (UVA+UVB protection) and wide brim hats (4-inch brim around the entire circumference of the hat). Call for new or changing lesions.   Destruction of lesion - nose x 2, right mid vertex scalp x 1, left ear x 5 Complexity: simple   Destruction method: cryotherapy   Informed consent: discussed and consent obtained   Timeout:  patient name, date of birth, surgical  site, and procedure verified Lesion destroyed using liquid nitrogen: Yes   Region frozen until ice ball extended beyond lesion: Yes   Outcome: patient tolerated procedure well with no complications   Post-procedure details: wound care instructions given    Inflamed seborrheic keratosis left hand x 17  Reassured benign age-related growth.  Recommend observation.  Discussed cryotherapy if spot(s) become irritated or inflamed.   Destruction of lesion - left hand x 17 Complexity: simple   Destruction method: cryotherapy   Informed consent: discussed and consent obtained   Timeout:  patient name, date of birth, surgical site, and procedure verified Lesion destroyed using liquid nitrogen: Yes   Region frozen until ice ball extended beyond lesion: Yes   Outcome: patient tolerated procedure well with no complications   Post-procedure details: wound care instructions given    Actinic Damage - chronic, secondary to cumulative UV radiation exposure/sun exposure over time - diffuse scaly erythematous macules with underlying dyspigmentation - Recommend daily broad spectrum sunscreen SPF 30+ to sun-exposed areas, reapply every 2 hours as needed.  - Recommend staying in the shade or wearing long sleeves, sun glasses (UVA+UVB protection) and wide brim hats (4-inch brim around the entire circumference of the hat). - Call for new or changing lesions.  Return in about 6 months (around 08/31/2021) for Aks, ISKs, and PDT in 3-4 weeks .  Marta Lamas, CMA, am acting as scribe for Sarina Ser, MD .  Documentation: I have reviewed the above documentation for  accuracy and completeness, and I agree with the above.  Sarina Ser, MD

## 2021-03-04 ENCOUNTER — Encounter: Payer: Self-pay | Admitting: Dermatology

## 2021-03-23 ENCOUNTER — Ambulatory Visit: Payer: Medicare Other

## 2021-04-17 DIAGNOSIS — I10 Essential (primary) hypertension: Secondary | ICD-10-CM | POA: Diagnosis not present

## 2021-04-17 DIAGNOSIS — E7800 Pure hypercholesterolemia, unspecified: Secondary | ICD-10-CM | POA: Diagnosis not present

## 2021-04-17 DIAGNOSIS — R001 Bradycardia, unspecified: Secondary | ICD-10-CM | POA: Diagnosis not present

## 2021-04-17 DIAGNOSIS — Z23 Encounter for immunization: Secondary | ICD-10-CM | POA: Diagnosis not present

## 2021-04-17 DIAGNOSIS — Z950 Presence of cardiac pacemaker: Secondary | ICD-10-CM | POA: Diagnosis not present

## 2021-04-17 DIAGNOSIS — I495 Sick sinus syndrome: Secondary | ICD-10-CM | POA: Diagnosis not present

## 2021-04-17 DIAGNOSIS — N1831 Chronic kidney disease, stage 3a: Secondary | ICD-10-CM | POA: Diagnosis not present

## 2021-04-17 DIAGNOSIS — I4892 Unspecified atrial flutter: Secondary | ICD-10-CM | POA: Diagnosis not present

## 2021-04-18 ENCOUNTER — Ambulatory Visit: Payer: Medicare Other

## 2021-04-18 ENCOUNTER — Other Ambulatory Visit: Payer: Self-pay

## 2021-04-18 DIAGNOSIS — L570 Actinic keratosis: Secondary | ICD-10-CM

## 2021-04-18 MED ORDER — AMINOLEVULINIC ACID HCL 20 % EX SOLR
1.0000 "application " | Freq: Once | CUTANEOUS | Status: AC
  Administered 2021-04-18: 354 mg via TOPICAL

## 2021-04-18 NOTE — Patient Instructions (Signed)

## 2021-04-18 NOTE — Progress Notes (Signed)
Patient completed PDT therapy today.  1. AK (actinic keratosis) Head - Anterior (Face)  Photodynamic therapy - Head - Anterior (Face) Procedure discussed: discussed risks, benefits, side effects. and alternatives   Prep: site scrubbed/prepped with acetone   Location:  Face Number of lesions:  Multiple Type of treatment:  Blue light Aminolevulinic Acid (see MAR for details): Levulan Number of Levulan sticks used:  1 Incubation time (minutes):  60 Number of minutes under lamp:  16 Number of seconds under lamp:  40 Cooling:  Floor fan Outcome: patient tolerated procedure well with no complications   Post-procedure details: sunscreen applied     

## 2021-04-28 ENCOUNTER — Encounter: Payer: Self-pay | Admitting: Internal Medicine

## 2021-04-28 ENCOUNTER — Non-Acute Institutional Stay: Payer: Medicare Other | Admitting: Internal Medicine

## 2021-04-28 DIAGNOSIS — N1831 Chronic kidney disease, stage 3a: Secondary | ICD-10-CM

## 2021-04-28 DIAGNOSIS — F39 Unspecified mood [affective] disorder: Secondary | ICD-10-CM

## 2021-04-28 DIAGNOSIS — I10 Essential (primary) hypertension: Secondary | ICD-10-CM | POA: Diagnosis not present

## 2021-04-28 DIAGNOSIS — E7800 Pure hypercholesterolemia, unspecified: Secondary | ICD-10-CM

## 2021-04-28 DIAGNOSIS — I4892 Unspecified atrial flutter: Secondary | ICD-10-CM | POA: Diagnosis not present

## 2021-04-28 DIAGNOSIS — I700 Atherosclerosis of aorta: Secondary | ICD-10-CM | POA: Diagnosis not present

## 2021-04-28 DIAGNOSIS — M1711 Unilateral primary osteoarthritis, right knee: Secondary | ICD-10-CM

## 2021-04-28 DIAGNOSIS — E039 Hypothyroidism, unspecified: Secondary | ICD-10-CM

## 2021-04-28 DIAGNOSIS — I495 Sick sinus syndrome: Secondary | ICD-10-CM | POA: Diagnosis not present

## 2021-04-28 DIAGNOSIS — I714 Abdominal aortic aneurysm, without rupture, unspecified: Secondary | ICD-10-CM

## 2021-04-28 DIAGNOSIS — R151 Fecal smearing: Secondary | ICD-10-CM

## 2021-04-28 NOTE — Assessment & Plan Note (Signed)
Continue Sertraline Support offered 

## 2021-04-28 NOTE — Assessment & Plan Note (Signed)
Paced He will continue to see cardiology for now

## 2021-04-28 NOTE — Assessment & Plan Note (Signed)
Currently not on a statin

## 2021-04-28 NOTE — Assessment & Plan Note (Signed)
Continue Tylenol as needed 

## 2021-04-28 NOTE — Assessment & Plan Note (Signed)
Monitor GFR Continue Lisinopril for renal protection

## 2021-04-28 NOTE — Progress Notes (Signed)
Subjective:    Patient ID: Frank Bartlett, male    DOB: 1925-10-31, 87 y.o.   MRN: [id number]  HPI  Resident seen in [address] No new concerns from staff or resident. He sleeps well, has some nocturia but able to fall back asleep easily. His appetite is good, weight is stable. Independent with ADL's. He walks without a device in his room but use rollater for longer distances. He denies chest pain or SB. He reports chronic knee pain that is not worse. He has some fecal incontinence but manages this himself. He denies urinary issues. He reports his mood is good.  AAA: Asymptomatic.   HLD with Aortic Atherosclerosis: He is not currently taking a statin. He is taking Eliquis as prescribed.  HTN: His BP today is slightly low at 97/59 but he denies dizziness. He is talking Amlodipine and Lisinopril as prescribed.  Hypothyroidism: He denies any issues on his current dose of Levothyroxine.  OA: Mainly in his knees. He takes Tylenol as needed with good relief of symptoms  Mood Disorder: Stable on his current dose of Sertraline.  A Flutter: Paroxysmal. He is taking Eliquis as prescribed.   SSS: Has pacer. He follows with cardiology.  CKD 3: His last creatinine was 1.7, GFR 38, 08/2020. He is on Lisinopril for renal protection.  Review of Systems     Past Medical History:  Diagnosis Date   AAA (abdominal aortic aneurysm)    Actinic keratosis 07/22/2017   left lateral crown, midline crown, right of midline crown   Aortic atherosclerosis (HCC)    Atrial fibrillation ([place]) 03/2016   brief spell   B12 deficiency    Basal cell carcinoma 10/30/2016   L lat crown   Bradycardia    Bradycardia 02/2017   Pacer placed   Chronic kidney disease (CKD), stage III (moderate) (HCC)    Hyperlipidemia    Hypertension    Hypothyroidism    Osteoarthritis    Prostate cancer ([place]) 2001   Rad tx's + seed implants   Renal cancer, left ([place]) 03/2016   Left Renal Nephrectomy   Sick sinus syndrome  (HCC)    Pacemaker    Current Outpatient Medications  Medication Sig Dispense Refill   acetaminophen (TYLENOL) 500 MG tablet Take 1,000 mg by mouth daily. And 1000mg  twice a day prn     amLODipine (NORVASC) 5 MG tablet Take 5 mg by mouth daily.     Cyanocobalamin 1000 MCG TBCR Take 1,000 mcg by mouth daily.      ELIQUIS 2.5 MG TABS tablet Take 2.5 mg by mouth 2 (two) times daily.     levothyroxine (SYNTHROID, LEVOTHROID) 112 MCG tablet Take 112 mcg by mouth daily before breakfast.     lisinopril (ZESTRIL) 10 MG tablet Take 10 mg by mouth daily.     Multiple Vitamin (MULTI-VITAMINS) TABS Take 1 tablet by mouth daily.     sertraline (ZOLOFT) 50 MG tablet Take 75 mg by mouth daily.     PROCTO-MED HC 2.5 % rectal cream Apply 1 application topically as directed. (Patient not taking: Reported on 04/28/2021)     No current facility-administered medications for this visit.    No Known Allergies  Family History  Problem Relation Age of Onset   Hypertension Mother    Breast cancer Mother    Colon cancer Father    Prostate cancer Neg Hx    Bladder Cancer Neg Hx    Kidney cancer Neg Hx  Social History   Socioeconomic History   Marital status: Widowed    Spouse name: Not on file   Number of children: Not on file   Years of education: Not on file   Highest education level: Not on file  Occupational History   Occupation: Comptroller/accountant    Comment: Retired  Tobacco Use   Smoking status: Never   Smokeless tobacco: Never  Substance and Sexual Activity   Alcohol use: No    Alcohol/week: 0.0 standard drinks    Comment: rare wine   Drug use: No   Sexual activity: Not Currently  Other Topics Concern   Not on file  Social History Narrative   Widowed 1/18   3 children      Has living will   Son [name] is [facility] POA   Has DNR   No tube feeds if cognitively unaware   Social Determinants of Health   Financial Resource Strain: Not on file  Food Insecurity: Not on file   Transportation Needs: Not on file  Physical Activity: Not on file  Stress: Not on file  Social Connections: Not on file  Intimate Partner Violence: Not on file     Constitutional: Denies fever, malaise, fatigue, headache or abrupt weight changes.  HEENT: Denies eye pain, eye redness, ear pain, ringing in the ears, wax buildup, runny nose, nasal congestion, bloody nose, or sore throat. Respiratory: Denies difficulty breathing, shortness of breath, cough or sputum production.   Cardiovascular: Denies chest pain, chest tightness, palpitations or swelling in the hands or feet.  Gastrointestinal: Pt reports fecal incontinence. Denies abdominal pain, bloating, constipation, diarrhea or blood in the stool.  GU: Pt reports nocturia. Denies urgency, frequency, pain with urination, burning sensation, blood in urine, odor or discharge. Musculoskeletal: Pt reports knee pain. Denies decrease in range of motion, difficulty with gait, muscle pain or joint swelling.  Skin: Denies redness, rashes, lesions or ulcercations.  Neurological: Denies dizziness, difficulty with memory, difficulty with speech or problems with balance and coordination.  Psych: Denies anxiety, depression, SI/HI.  No other specific complaints in a complete review of systems (except as listed in HPI above).  Objective:   Physical Exam  BP (!) 97/59    Pulse (!) 59    Temp 98 F (36.7 C)    Resp 16    Wt 176 lb 9.6 oz (80.1 kg)    SpO2 98%    BMI 26.08 kg/m  Wt Readings from Last 3 Encounters:  04/28/21 176 lb 9.6 oz (80.1 kg)  02/02/21 178 lb (80.7 kg)  11/02/20 173 lb 12.8 oz (78.8 kg)    General: Appears his stated age, well developed, well nourished in NAD. Skin: Warm, dry and intact.  HEENT: Head: normal shape and size;  Cardiovascular: Normal rate and rhythm. S1,S2 noted.  No murmur, rubs or gallops noted. No JVD or BLE edema.  Pulmonary/Chest: Normal effort and positive vesicular breath sounds. No respiratory  distress. No wheezes, rales or ronchi noted.  Abdomen: Soft and nontender. Normal bowel sounds. Hernia noted. Neurological: Alert and oriented.  Psychiatric: Mood and affect normal.  BMET    Component Value Date/Time   NA 135 04/28/2020 0522   NA 133 (A) 10/02/2018 0000   K 4.3 04/28/2020 0522   CL 105 04/28/2020 0522   CO2 22 04/28/2020 0522   GLUCOSE 115 (H) 04/28/2020 0522   BUN 24 (H) 04/28/2020 0522   BUN 24 10/11/2017 1004   CREATININE 1.41 (H) 04/28/2020 0522  CALCIUM 8.2 (L) 04/28/2020 0522   GFRNONAA 47 (L) 04/28/2020 0522   GFRAA 50 (L) 10/11/2017 1004    Lipid Panel  No results found for: CHOL, TRIG, HDL, CHOLHDL, VLDL, LDLCALC  CBC    Component Value Date/Time   WBC 6.2 04/28/2020 0522   RBC 3.26 (L) 04/28/2020 0522   HGB 10.7 (L) 04/28/2020 0522   HGB 11.0 (L) 04/17/2017 1031   HCT 31.5 (L) 04/28/2020 0522   HCT 33.9 (L) 04/17/2017 1031   PLT 217 04/28/2020 0522   PLT 293 04/17/2017 1031   MCV 96.6 04/28/2020 0522   MCV 94 04/17/2017 1031   MCH 32.8 04/28/2020 0522   MCHC 34.0 04/28/2020 0522   RDW 13.0 04/28/2020 0522   RDW 14.1 04/17/2017 1031   LYMPHSABS 0.5 (L) 03/12/2016 2216   MONOABS 0.6 03/12/2016 2216   EOSABS 0.1 03/12/2016 2216   BASOSABS 0.1 03/12/2016 2216    Hgb A1C No results found for: HGBA1C          Assessment & Plan:    Webb Silversmith, NP This visit occurred during the SARS-CoV-2 public health emergency.  Safety protocols were in place, including screening questions prior to the visit, additional usage of staff PPE, and extensive cleaning of exam room while observing appropriate contact time as indicated for disinfecting solutions.

## 2021-04-28 NOTE — Assessment & Plan Note (Signed)
Continue Eliquis Not currently on statin

## 2021-04-28 NOTE — Assessment & Plan Note (Signed)
Continue Levothyroxine Monitor Free T4 yearly

## 2021-04-28 NOTE — Patient Instructions (Signed)
Fecal Incontinence Fecal incontinence, also called accidental bowel leakage, is not being able to control your bowels. This condition happens because the nerves or muscles around the anus do not work the way they should. This affects their ability to hold stool (feces). What are the causes? This condition may be caused by: Damage to the muscles at the end of the rectum (sphincter). Damage to the nerves that control bowel movements. Diarrhea. Chronic constipation. Pelvic floor dysfunction. This means the muscles in the pelvis do not work well. Loss of bowel storage capacity. This occurs when the rectum can no longer stretch in size in order to store feces. Inflammatory bowel disease (IBD), such as Crohn's disease. Irritable bowel syndrome (IBS). What increases the risk? You are more likely to develop this condition if you: Were born with bowels or a pelvis that did not form correctly. Have had rectal surgery. Have had radiation treatment for certain cancers. Have been pregnant, had a vaginal delivery, or had surgery that damaged the pelvic floor muscles. Had a complicated childbirth, spinal cord injury, or other trauma that caused nerve damage. Have a condition that can affect nerve function, such as diabetes, Parkinson's disease, or multiple sclerosis. Have a condition where the rectum drops down into the anus or vagina (prolapse). Are 85 years of age or older. What are the signs or symptoms? The main symptom of this condition is not being able to control your bowels. You also might not be able to get to the bathroom before a bowel movement. How is this diagnosed? This condition is diagnosed with a medical history and physical exam. You may also have other tests, including: Blood tests. Urine tests. A rectal exam. Ultrasound. MRI. Colonoscopy. This is an exam that looks at your large intestine (colon). Anal manometry. This is a test that measures the strength of the anal  sphincter. Anal electromyogram (EMG). This is a test that uses small electrodes to check for nerve damage. How is this treated? Treatment for this condition depends on the cause and severity. Treatment may also focus on addressing any underlying causes of this condition. Treatment may include: Medicines. This may include medicines to: Prevent diarrhea. Help with constipation (bulk-forming laxatives). Treat any underlying conditions. Biofeedback therapy. This can help to retrain muscles that are affected. Fiber supplements. These can help manage your bowel movements. Nerve stimulation. Injectable gel to promote tissue growth and better muscle control. Surgery. You may need: Sphincter repair surgery. Diversion surgery. This procedure lets feces pass out of your body through a hole in your abdomen. Follow these instructions at home: Eating and drinking  Follow instructions from your health care provider about any eating or drinking restrictions. Work with a dietitian to come up with a healthy diet that will help you avoid the foods that can make your condition worse. Keep a diet diary to find out which foods or drinks could be making your condition worse. Drink enough fluid to keep your urine pale yellow. Lifestyle Do not use any products that contain nicotine or tobacco, such as cigarettes and e-cigarettes. If you need help quitting, ask your health care provider. This may help your condition. If you are overweight, talk with your health care provider about how to safely lose weight. This may help your condition. Increase your physical activity as told by your health care provider. This may help your condition. Always talk with your health care provider before starting a new exercise program. Carry a change of clothes and supplies to clean up  quickly if you have an episode of fecal incontinence. Consider joining a fecal incontinence support group. You can find a support group online or in  your local community. General instructions  Take over-the-counter and prescription medicines only as told by your health care provider. This includes any supplements. Apply a moisture barrier, such as petroleum jelly, to your rectum. This protects the skin from irritation caused by ongoing leaking or diarrhea. Tell your health care provider if you are upset or depressed about your condition. Keep all follow-up visits as told by your health care provider. This is important. Where to find more information [employer] Gastrointestinal [employer]: iffgd.[place] [facility] Gastro[facility]: patients.gi.org Contact a health care provider if: You have a fever. You have redness, swelling, or pain around your rectum. Your pain is getting worse or you lose feeling in your rectal area. You have blood in your stool. You feel sad or hopeless. You avoid social or work situations. Get help right away if: You stop having bowel movements. You cannot eat or drink without vomiting. You have rectal bleeding that does not stop. You have severe pain that is getting worse. You have symptoms of dehydration, including: Sleepiness or fatigue. Producing little or no urine, tears, or sweat. Dizziness. Dry mouth. Unusual irritability. Headache. Inability to think clearly. Summary Fecal incontinence, also called accidental bowel leakage, is not being able to control your bowels. This condition happens because the nerves or muscles around the anus do not work the way they should. Treatment varies depending on the cause and severity of your condition. Treatment may also focus on addressing any underlying causes of this condition. Follow instructions from your health care provider about any eating or drinking restrictions, lifestyle changes, and skin care. Take over-the-counter and prescription medicines only as told by your health care provider. This includes any supplements. Tell  your health care provider if your symptoms worsen or if you are upset or depressed about your condition. This information is not intended to replace advice given to you by your health care provider. Make sure you discuss any questions you have with your health care provider. Document Revised: 11/02/2020 Document Reviewed: 11/02/2020 Elsevier Patient Education  [place].

## 2021-04-28 NOTE — Assessment & Plan Note (Signed)
Manages himself Appreciate ALF care

## 2021-04-28 NOTE — Assessment & Plan Note (Signed)
Continue Eliquis Will monitor

## 2021-04-28 NOTE — Assessment & Plan Note (Signed)
Controlled on Amlodipine and Lisinopril If continues to run on the low side, would decrease/stop Amlodipine Monitor

## 2021-04-28 NOTE — Assessment & Plan Note (Signed)
Asymptomatic. 

## 2021-06-13 DIAGNOSIS — R001 Bradycardia, unspecified: Secondary | ICD-10-CM | POA: Diagnosis not present

## 2021-07-27 ENCOUNTER — Ambulatory Visit (INDEPENDENT_AMBULATORY_CARE_PROVIDER_SITE_OTHER): Payer: Medicare Other | Admitting: Internal Medicine

## 2021-07-27 ENCOUNTER — Encounter: Payer: Self-pay | Admitting: Internal Medicine

## 2021-07-27 ENCOUNTER — Other Ambulatory Visit: Payer: Self-pay

## 2021-07-27 VITALS — BP 130/76 | HR 80 | Temp 97.7°F | Resp 16 | Wt 179.4 lb

## 2021-07-27 DIAGNOSIS — N400 Enlarged prostate without lower urinary tract symptoms: Secondary | ICD-10-CM | POA: Insufficient documentation

## 2021-07-27 DIAGNOSIS — I700 Atherosclerosis of aorta: Secondary | ICD-10-CM | POA: Diagnosis not present

## 2021-07-27 DIAGNOSIS — I10 Essential (primary) hypertension: Secondary | ICD-10-CM | POA: Diagnosis not present

## 2021-07-27 DIAGNOSIS — I480 Paroxysmal atrial fibrillation: Secondary | ICD-10-CM | POA: Diagnosis not present

## 2021-07-27 DIAGNOSIS — F39 Unspecified mood [affective] disorder: Secondary | ICD-10-CM

## 2021-07-27 DIAGNOSIS — N1831 Chronic kidney disease, stage 3a: Secondary | ICD-10-CM

## 2021-07-27 DIAGNOSIS — I495 Sick sinus syndrome: Secondary | ICD-10-CM | POA: Diagnosis not present

## 2021-07-27 DIAGNOSIS — M1711 Unilateral primary osteoarthritis, right knee: Secondary | ICD-10-CM | POA: Diagnosis not present

## 2021-07-27 DIAGNOSIS — N401 Enlarged prostate with lower urinary tract symptoms: Secondary | ICD-10-CM

## 2021-07-27 NOTE — Assessment & Plan Note (Signed)
No action at his age and no other ischemic disease ?

## 2021-07-27 NOTE — Assessment & Plan Note (Signed)
Some nocturia that is an issue---doing okay with it for now ?If worsens, would try tamsulosin 0.'4mg'$  daily ?

## 2021-07-27 NOTE — Assessment & Plan Note (Signed)
Does okay with tylenol 

## 2021-07-27 NOTE — Assessment & Plan Note (Signed)
Mood has been good ?Continues on the sertraline '75mg'$  daily ?Does have periods of discouragement---so he would like to maintain this dose ?

## 2021-07-27 NOTE — Assessment & Plan Note (Signed)
Stable GFR on lisinopril '10mg'$  daily ?

## 2021-07-27 NOTE — Assessment & Plan Note (Signed)
BP Readings from Last 3 Encounters:  ?07/27/21 130/76  ?04/28/21 (!) 97/59  ?02/02/21 136/78  ? ?Good BP control on amlodipine '5mg'$  and lisinopril '10mg'$  daily ?

## 2021-07-27 NOTE — Assessment & Plan Note (Signed)
Pacer functioning fine ?

## 2021-07-27 NOTE — Progress Notes (Signed)
? ?Subjective:  ? ? Patient ID: Frank Bartlett, male    DOB: 1925/12/24, 88 y.o.   MRN: [id number] ? ?HPI ?Visit in assisted living apartment for review of chronic health conditions ?Reviewed status with Collier Salina RN ? ?Remains functionally independent ?Gets around with rollator ?Independent with ADLs ?Goes to exercise classes ?Mood has been good ? ?Still having some bowel problems. Will move bowels, usually after breakfast. Solid but then watery and frequent ?Mostly clear---occasional blood from hemorrhoid ?No stomach pain ?Appetite okay---eats per his usual ?Still wears full briefs for protection (plus shield) ? ?Heart is fine ?Gets pacemaker checks remotely. Did see Dr Saralyn Pilar ?No palpitations--but did have some atrial fib noted on pacer  ?No chest pain or SOB ?No dizziness or syncope ? ?Last GFR 47 ?Due for recheck in May ? ?Current Outpatient Medications on File Prior to Visit  ?Medication Sig Dispense Refill  ? acetaminophen (TYLENOL) 500 MG tablet Take 1,000 mg by mouth daily. And '1000mg'$  twice a day prn    ? amLODipine (NORVASC) 5 MG tablet Take 5 mg by mouth daily.    ? Cyanocobalamin 1000 MCG TBCR Take 1,000 mcg by mouth daily.     ? ELIQUIS 2.5 MG TABS tablet Take 2.5 mg by mouth 2 (two) times daily.    ? levothyroxine (SYNTHROID, LEVOTHROID) 112 MCG tablet Take 112 mcg by mouth daily before breakfast.    ? lisinopril (ZESTRIL) 10 MG tablet Take 10 mg by mouth daily.    ? Multiple Vitamin (MULTI-VITAMINS) TABS Take 1 tablet by mouth daily.    ? PROCTO-MED HC 2.5 % rectal cream Apply 1 application. topically as directed.    ? sertraline (ZOLOFT) 50 MG tablet Take 75 mg by mouth daily.    ? ?No current facility-administered medications on file prior to visit.  ? ? ?No Known Allergies ? ?Past Medical History:  ?Diagnosis Date  ? AAA (abdominal aortic aneurysm)   ? Actinic keratosis 07/22/2017  ? left lateral crown, midline crown, right of midline crown  ? Aortic atherosclerosis ([place])   ? Atrial fibrillation  ([place]) 03/2016  ? brief spell  ? B12 deficiency   ? Basal cell carcinoma 10/30/2016  ? L lat crown  ? Bradycardia   ? Bradycardia 02/2017  ? Pacer placed  ? Chronic kidney disease (CKD), stage III (moderate) (HCC)   ? Hyperlipidemia   ? Hypertension   ? Hypothyroidism   ? Osteoarthritis   ? Prostate cancer [facility]) 2001  ? Rad tx's + seed implants  ? Renal cancer, left ([place]) 03/2016  ? Left Renal Nephrectomy  ? Sick sinus syndrome ([place])   ? Pacemaker  ? ? ?Past Surgical History:  ?Procedure Laterality Date  ? CATARACT EXTRACTION, BILATERAL    ? COLONOSCOPY    ? EXCISIONAL HEMORRHOIDECTOMY    ? INSERTION PROSTATE RADIATION SEED    ? LAPAROSCOPIC NEPHRECTOMY, HAND ASSISTED Left 03/08/2016  ? Procedure: HAND ASSISTED LAPAROSCOPIC NEPHRECTOMY;  Surgeon: Nickie Retort, MD;  Location: [facility] ORS;  Service: Urology;  Laterality: Left;  ? LAPAROTOMY N/A 03/13/2016  ? Procedure: EXPLORATORY LAPAROTOMY;  Surgeon: Festus Aloe, MD;  Location: [facility] OR[facility];  Service: Urology;  Laterality: N/A;  ? PACEMAKER INSERTION N/A 02/20/2017  ? Procedure: INSERTION PACEMAKER;  Surgeon: Isaias Cowman, MD;  Location: [facility] OR[facility];  Service: Cardiovascular;  Laterality: N/A;  ? ? ?Family History  ?Problem Relation Age of Onset  ? Hypertension Mother   ? Breast cancer Mother   ? Colon cancer Father   ?  Prostate cancer Neg Hx   ? Bladder Cancer Neg Hx   ? Kidney cancer Neg Hx   ? ? ?Social History  ? ?Socioeconomic History  ? Marital status: Widowed  ?  Spouse name: Not on file  ? Number of children: Not on file  ? Years of education: Not on file  ? Highest education level: Not on file  ?Occupational History  ? Occupation: Comptroller/accountant  ?  Comment: Retired  ?Tobacco Use  ? Smoking status: Never  ? Smokeless tobacco: Never  ?Substance and Sexual Activity  ? Alcohol use: No  ?  Alcohol/week: 0.0 standard drinks  ?  Comment: rare wine  ? Drug use: No  ? Sexual activity: Not Currently  ?Other Topics Concern  ? Not on file  ?Social  History Narrative  ? Widowed 1/18  ? 3 children  ?   ? Has living will  ? Son [name] is [facility] POA  ? Has DNR  ? No tube feeds if cognitively unaware  ? ?Social Determinants of Health  ? ?Financial Resource Strain: Not on file  ?Food Insecurity: Not on file  ?Transportation Needs: Not on file  ?Physical Activity: Not on file  ?Stress: Not on file  ?Social Connections: Not on file  ?Intimate Partner Violence: Not on file  ? ?Review of Systems ?Sleeps okay ?Nocturia every 2 hours or so. Flow is okay in day--and seems to empty okay. Rare urge incontinence ?Can get some low back pain---if prolonged standing. Takes tylenol daily and prn. Improves with sitting ?Some right knee pain--variable. Got rash with diclofenac gel----so stopped ? ?   ?Objective:  ? Physical Exam ?Constitutional:   ?   Appearance: Normal appearance.  ?Cardiovascular:  ?   Rate and Rhythm: Normal rate and regular rhythm.  ?   Heart sounds: No murmur heard. ?  No gallop.  ?Pulmonary:  ?   Effort: Pulmonary effort is normal.  ?   Breath sounds: Normal breath sounds. No wheezing or rales.  ?Abdominal:  ?   Palpations: Abdomen is soft.  ?   Tenderness: There is no abdominal tenderness.  ?Musculoskeletal:  ?   Cervical back: Neck supple.  ?   Right lower leg: No edema.  ?   Left lower leg: No edema.  ?Lymphadenopathy:  ?   Cervical: No cervical adenopathy.  ?Neurological:  ?   Mental Status: He is alert.  ?Psychiatric:     ?   Mood and Affect: Mood normal.     ?   Behavior: Behavior normal.  ?  ? ? ? ? ?   ?Assessment & Plan:  ? ?

## 2021-07-27 NOTE — Assessment & Plan Note (Signed)
Has been found on the pacer interrogation ?No symptoms ?On eliquis 2.'5mg'$  bid ?

## 2021-09-07 ENCOUNTER — Ambulatory Visit: Payer: Medicare Other | Admitting: Dermatology

## 2021-09-07 DIAGNOSIS — L578 Other skin changes due to chronic exposure to nonionizing radiation: Secondary | ICD-10-CM | POA: Diagnosis not present

## 2021-09-07 DIAGNOSIS — Z85828 Personal history of other malignant neoplasm of skin: Secondary | ICD-10-CM | POA: Diagnosis not present

## 2021-09-07 DIAGNOSIS — L570 Actinic keratosis: Secondary | ICD-10-CM | POA: Diagnosis not present

## 2021-09-07 DIAGNOSIS — L820 Inflamed seborrheic keratosis: Secondary | ICD-10-CM

## 2021-09-07 NOTE — Progress Notes (Signed)
Follow-Up Visit   Subjective  Frank Bartlett is a 88 y.o. male who presents for the following: AK f/u (Scalp, face, ears, 42mf/u, PDT w/ ALA 04/18/21) and ISK f/u (L hand, 64m/u). The patient has spots, moles and lesions to be evaluated, some may be new or changing and the patient has concerns that these could be cancer.  The following portions of the chart were reviewed this encounter and updated as appropriate:   Tobacco  Allergies  Meds  Problems  Med Hx  Surg Hx  Fam Hx     Review of Systems:  No other skin or systemic complaints except as noted in HPI or Assessment and Plan.  Objective  Well appearing patient in no apparent distress; mood and affect are within normal limits.  A focused examination was performed including face, scalp,ears, hands. Relevant physical exam findings are noted in the Assessment and Plan.  bil hands x 4 (4) Stuck on waxy paps with erythema  R mid vertex scalp x 1, crown scalp x 3, R ear x 2, R temple x 1, L preauricular x 3, L brow x 1, nose x 1, L temple x 1 (13) hyperkeratotic paps   Assessment & Plan   Actinic Damage - chronic, secondary to cumulative UV radiation exposure/sun exposure over time - diffuse scaly erythematous macules with underlying dyspigmentation - Recommend daily broad spectrum sunscreen SPF 30+ to sun-exposed areas, reapply every 2 hours as needed.  - Recommend staying in the shade or wearing long sleeves, sun glasses (UVA+UVB protection) and wide brim hats (4-inch brim around the entire circumference of the hat). - Call for new or changing lesions.   Inflamed seborrheic keratosis (4) bil hands x 4  Destruction of lesion - bil hands x 4 Complexity: simple   Destruction method: cryotherapy   Informed consent: discussed and consent obtained   Timeout:  patient name, date of birth, surgical site, and procedure verified Lesion destroyed using liquid nitrogen: Yes   Region frozen until ice ball extended beyond  lesion: Yes   Outcome: patient tolerated procedure well with no complications   Post-procedure details: wound care instructions given    Hypertrophic actinic keratosis (13) R mid vertex scalp x 1, crown scalp x 3, R ear x 2, R temple x 1, L preauricular x 3, L brow x 1, nose x 1, L temple x 1  Destruction of lesion - R mid vertex scalp x 1, crown scalp x 3, R ear x 2, R temple x 1, L preauricular x 3, L brow x 1, nose x 1, L temple x 1 Complexity: simple   Destruction method: cryotherapy   Informed consent: discussed and consent obtained   Timeout:  patient name, date of birth, surgical site, and procedure verified Lesion destroyed using liquid nitrogen: Yes   Region frozen until ice ball extended beyond lesion: Yes   Outcome: patient tolerated procedure well with no complications   Post-procedure details: wound care instructions given    History of Basal Cell Carcinoma of the Skin - No evidence of recurrence today - Recommend regular full body skin exams - Recommend daily broad spectrum sunscreen SPF 30+ to sun-exposed areas, reapply every 2 hours as needed.  - Call if any new or changing lesions are noted between office visits  - L lat crown  Return in about 6 months (around 03/10/2022) for Hx of AKs.  I, SoOthelia PullingRMA, am acting as scribe for DaSarina SerMD . Documentation:  I have reviewed the above documentation for accuracy and completeness, and I agree with the above.  Sarina Ser, MD

## 2021-09-07 NOTE — Patient Instructions (Addendum)

## 2021-09-25 ENCOUNTER — Encounter: Payer: Self-pay | Admitting: Dermatology

## 2021-09-27 DIAGNOSIS — L603 Nail dystrophy: Secondary | ICD-10-CM | POA: Diagnosis not present

## 2021-09-27 DIAGNOSIS — I7091 Generalized atherosclerosis: Secondary | ICD-10-CM | POA: Diagnosis not present

## 2021-09-27 DIAGNOSIS — B351 Tinea unguium: Secondary | ICD-10-CM | POA: Diagnosis not present

## 2021-09-27 DIAGNOSIS — R600 Localized edema: Secondary | ICD-10-CM | POA: Diagnosis not present

## 2021-09-28 DIAGNOSIS — N183 Chronic kidney disease, stage 3 unspecified: Secondary | ICD-10-CM | POA: Diagnosis not present

## 2021-09-28 DIAGNOSIS — E039 Hypothyroidism, unspecified: Secondary | ICD-10-CM | POA: Diagnosis not present

## 2021-09-28 DIAGNOSIS — E7800 Pure hypercholesterolemia, unspecified: Secondary | ICD-10-CM | POA: Diagnosis not present

## 2021-10-04 ENCOUNTER — Non-Acute Institutional Stay: Payer: Medicare Other | Admitting: Internal Medicine

## 2021-10-04 VITALS — BP 138/89 | HR 73 | Temp 97.5°F | Resp 18 | Wt 180.2 lb

## 2021-10-04 DIAGNOSIS — R5383 Other fatigue: Secondary | ICD-10-CM

## 2021-10-04 DIAGNOSIS — R410 Disorientation, unspecified: Secondary | ICD-10-CM

## 2021-10-04 DIAGNOSIS — R531 Weakness: Secondary | ICD-10-CM | POA: Diagnosis not present

## 2021-10-04 DIAGNOSIS — E871 Hypo-osmolality and hyponatremia: Secondary | ICD-10-CM | POA: Diagnosis not present

## 2021-10-04 DIAGNOSIS — R296 Repeated falls: Secondary | ICD-10-CM | POA: Diagnosis not present

## 2021-10-04 DIAGNOSIS — R5381 Other malaise: Secondary | ICD-10-CM | POA: Diagnosis not present

## 2021-10-05 NOTE — Progress Notes (Unsigned)
Subjective:    Patient ID: Frank Bartlett, male    DOB: 1925-09-18, 88 y.o.   MRN: [id number]  HPI  Resident seen in [address] RN reports decline in status. He has had 2 falls in the last week. Resident reports he has been weak, fatigued. RN also notes that he seems a little more confused lately. Resident reports he has been sleeping well. His appetite has been fine, he did get nauseated this morning. He reports he drinks 4 small glasses of water each day with his pills. He reports his bowels are moving okay. He does have nocturia. He denies over joint pain. He reports feeling unwell does make him a little depressed. He is on Sertraline 75 mg daily. Of note, on his recent labs, his sodium was 125. He is not on a diuretic.  Review of Systems     Past Medical History:  Diagnosis Date   AAA (abdominal aortic aneurysm) ([place])    Actinic keratosis 07/22/2017   left lateral crown, midline crown, right of midline crown   Aortic atherosclerosis (HCC)    Atrial fibrillation ([place]) 03/2016   brief spell   B12 deficiency    Basal cell carcinoma 10/30/2016   L lat crown   Bradycardia    Bradycardia 02/2017   Pacer placed   Chronic kidney disease (CKD), stage III (moderate) (HCC)    Hyperlipidemia    Hypertension    Hypothyroidism    Osteoarthritis    Prostate cancer ([place]) 2001   Rad tx's + seed implants   Renal cancer, left ([place]) 03/2016   Left Renal Nephrectomy   Sick sinus syndrome (HCC)    Pacemaker    Current Outpatient Medications  Medication Sig Dispense Refill   acetaminophen (TYLENOL) 500 MG tablet Take 1,000 mg by mouth daily. And '1000mg'$  twice a day prn     amLODipine (NORVASC) 5 MG tablet Take 5 mg by mouth daily.     Cyanocobalamin 1000 MCG TBCR Take 1,000 mcg by mouth daily.      ELIQUIS 2.5 MG TABS tablet Take 2.5 mg by mouth 2 (two) times daily.     levothyroxine (SYNTHROID, LEVOTHROID) 112 MCG tablet Take 112 mcg by mouth daily before breakfast.     lisinopril  (ZESTRIL) 10 MG tablet Take 10 mg by mouth daily.     Multiple Vitamin (MULTI-VITAMINS) TABS Take 1 tablet by mouth daily.     PROCTO-MED HC 2.5 % rectal cream Apply 1 application. topically as directed.     sertraline (ZOLOFT) 50 MG tablet Take 75 mg by mouth daily.     No current facility-administered medications for this visit.    No Known Allergies  Family History  Problem Relation Age of Onset   Hypertension Mother    Breast cancer Mother    Colon cancer Father    Prostate cancer Neg Hx    Bladder Cancer Neg Hx    Kidney cancer Neg Hx     Social History   Socioeconomic History   Marital status: Widowed    Spouse name: Not on file   Number of children: Not on file   Years of education: Not on file   Highest education level: Not on file  Occupational History   Occupation: Comptroller/accountant    Comment: Retired  Tobacco Use   Smoking status: Never   Smokeless tobacco: Never  Substance and Sexual Activity   Alcohol use: No    Alcohol/week: 0.0 standard drinks    Comment:  rare wine   Drug use: No   Sexual activity: Not Currently  Other Topics Concern   Not on file  Social History Narrative   Widowed 1/18   3 children      Has living will   Son [name] is [facility] POA   Has DNR   No tube feeds if cognitively unaware   Social Determinants of Health   Financial Resource Strain: Not on file  Food Insecurity: Not on file  Transportation Needs: Not on file  Physical Activity: Not on file  Stress: Not on file  Social Connections: Not on file  Intimate Partner Violence: Not on file     Constitutional: Pt reports fatigue. Denies fever, malaise, headache or abrupt weight changes.  HEENT: Denies eye pain, eye redness, ear pain, ringing in the ears, wax buildup, runny nose, nasal congestion, bloody nose, or sore throat. Respiratory: Denies difficulty breathing, shortness of breath, cough or sputum production.   Cardiovascular: Pt reports nausea. Denies chest pain,  chest tightness, palpitations or swelling in the hands or feet.  Gastrointestinal: Denies abdominal pain, bloating, constipation, diarrhea or blood in the stool.  GU: Pt reports nocturia Denies urgency, frequency, pain with urination, burning sensation, blood in urine, odor or discharge. Musculoskeletal: Pt reports generalized weakness, frequent falls. Denies decrease in range of motion, muscle pain or joint pain and swelling.  Skin: Denies redness, rashes, lesions or ulcercations.  Neurological: Pt reports confusion, difficulty with balance. Denies dizziness,  difficulty with speech.  Psych: Pt has a history of depression. Denies anxiety, SI/HI.  No other specific complaints in a complete review of systems (except as listed in HPI above).  Objective:   Physical Exam   BP 138/89   Pulse 73   Temp (!) 97.5 F (36.4 C)   Resp 18   Wt 180 lb 3.2 oz (81.7 kg)   SpO2 97%   BMI 26.61 kg/m   Wt Readings from Last 3 Encounters:  07/27/21 179 lb 6.4 oz (81.4 kg)  04/28/21 176 lb 9.6 oz (80.1 kg)  02/02/21 178 lb (80.7 kg)    General: Appears his stated age, overweight, in NAD. Skin: Warm, dry and intact.  HEENT: Head: normal shape and size; Eyes: EOMs intact; Cardiovascular: Normal rate and rhythm. S1,S2 noted.  No murmur, rubs or gallops noted.  Pulmonary/Chest: Normal effort and positive vesicular breath sounds. No respiratory distress. No wheezes, rales or ronchi noted.  Abdomen: Soft and nontender. Normal bowel sounds.  Neurological: Alert and oriented.  Psychiatric: Mood and affect normal. Behavior is normal. Judgment and thought content normal.     BMET    Component Value Date/Time   NA 135 04/28/2020 0522   NA 133 (A) 10/02/2018 0000   K 4.3 04/28/2020 0522   CL 105 04/28/2020 0522   CO2 22 04/28/2020 0522   GLUCOSE 115 (H) 04/28/2020 0522   BUN 24 (H) 04/28/2020 0522   BUN 24 10/11/2017 1004   CREATININE 1.41 (H) 04/28/2020 0522   CALCIUM 8.2 (L) 04/28/2020 0522    GFRNONAA 47 (L) 04/28/2020 0522   GFRAA 50 (L) 10/11/2017 1004    Lipid Panel  No results found for: CHOL, TRIG, HDL, CHOLHDL, VLDL, LDLCALC  CBC    Component Value Date/Time   WBC 6.2 04/28/2020 0522   RBC 3.26 (L) 04/28/2020 0522   HGB 10.7 (L) 04/28/2020 0522   HGB 11.0 (L) 04/17/2017 1031   HCT 31.5 (L) 04/28/2020 0522   HCT 33.9 (L) 04/17/2017 1031  PLT 217 04/28/2020 0522   PLT 293 04/17/2017 1031   MCV 96.6 04/28/2020 0522   MCV 94 04/17/2017 1031   MCH 32.8 04/28/2020 0522   MCHC 34.0 04/28/2020 0522   RDW 13.0 04/28/2020 0522   RDW 14.1 04/17/2017 1031   LYMPHSABS 0.5 (L) 03/12/2016 2216   MONOABS 0.6 03/12/2016 2216   EOSABS 0.1 03/12/2016 2216   BASOSABS 0.1 03/12/2016 2216    Hgb A1C No results found for: HGBA1C         Assessment & Plan:   Decline in Status, Generalized Weakness, Fatigue, Confusion, Frequent Falls, Hyponatremia:  His hyponatremia is likely the contributing factor and needs to be fixed Encourage fluid restriction, advised him only to take 2 glasses of water with his pills instead of 4 twice daily We will decrease his Sertraline to 25 mg daily from 75 mg Encouraged him to add more salt to his diet He is not currently on a diuretic We will start Omeprazole 20 mg daily given his complaint of nausea and poor appetite PT has been ordered for further evaluation and treatment of his frequent falls  If he remains weak, confused and continues to fall, would recommend ER evaluation Webb Silversmith, NP

## 2021-10-06 ENCOUNTER — Encounter: Payer: Self-pay | Admitting: Internal Medicine

## 2021-10-06 MED ORDER — SERTRALINE HCL 25 MG PO TABS: 25.0000 mg | ORAL_TABLET | Freq: Every day | ORAL | 3 refills | Status: DC

## 2021-10-06 MED ORDER — OMEPRAZOLE 20 MG PO CPDR: 20.0000 mg | DELAYED_RELEASE_CAPSULE | Freq: Every day | ORAL | 3 refills | Status: DC

## 2021-10-06 NOTE — Patient Instructions (Signed)
Hyponatremia Hyponatremia is when the amount of salt (sodium) in your blood is too low. When salt levels are low, your body cells may take in extra water. This can cause swelling. The swelling often affects the brain. What are the causes? Certain medical problems or conditions. Vomiting a lot. Having watery poop (diarrhea) often. Sweating too much. Taking certain medicines or using illegal drugs. Fluids given through an IV tube. What increases the risk? Having heart, kidney, or liver failure. Having a medical condition that causes you to have watery poop a lot. Doing very hard exercises. Taking medicines that affect the amount of salt that is in your blood. What are the signs or symptoms? Symptoms of this condition include: Headache. Feeling like you may vomit (nausea). Vomiting. Being very tired. Muscle weakness and cramps. Not wanting to eat as much as normal. Feeling weak or dizzy. Very bad symptoms of this condition include: Confusion. Feeling restless. Having a fast heart rate. Fainting. Seizures. Coma. How is this treated? Treatment for this condition depends on the cause. Treatment may include: Getting fluids through an IV tube that is put into one of your veins. Taking medicines to fix the salt levels in your blood. If medicines are causing the problem, your medicines will need to be changed. Limiting how much water or fluid you take in, in some cases. Monitoring in the hospital to watch your symptoms. Follow these instructions at home:  Take over-the-counter and prescription medicines only as told by your doctor. Many medicines can make this condition worse. Talk with your doctor about any medicines that you are taking. Do not drink alcohol. Keep all follow-up visits. Contact a doctor if: You feel more like you may vomit. You feel more tired. Your headache gets worse. You feel more confused. You feel weaker. Your symptoms go away and then they come back. Get  help right away if: You have a seizure. You faint. You keep having watery poop. You keep vomiting. Summary Hyponatremia is when the amount of salt in your blood is too low. When salt levels are low, you can have swelling throughout the body. The swelling mostly affects the brain. Treatment depends on the cause. Treatment may include IV fluids and changing medicines. This information is not intended to replace advice given to you by your health care provider. Make sure you discuss any questions you have with your health care provider. Document Revised: 11/01/2020 Document Reviewed: 11/01/2020 Elsevier Patient Education  2023 [employer]  

## 2021-10-13 DIAGNOSIS — R2689 Other abnormalities of gait and mobility: Secondary | ICD-10-CM | POA: Diagnosis not present

## 2021-10-13 DIAGNOSIS — M25561 Pain in right knee: Secondary | ICD-10-CM | POA: Diagnosis not present

## 2021-10-13 DIAGNOSIS — R296 Repeated falls: Secondary | ICD-10-CM | POA: Diagnosis not present

## 2021-10-13 DIAGNOSIS — R278 Other lack of coordination: Secondary | ICD-10-CM | POA: Diagnosis not present

## 2021-10-18 DIAGNOSIS — N1831 Chronic kidney disease, stage 3a: Secondary | ICD-10-CM | POA: Diagnosis not present

## 2021-10-18 DIAGNOSIS — I4892 Unspecified atrial flutter: Secondary | ICD-10-CM | POA: Diagnosis not present

## 2021-10-18 DIAGNOSIS — E7800 Pure hypercholesterolemia, unspecified: Secondary | ICD-10-CM | POA: Diagnosis not present

## 2021-10-18 DIAGNOSIS — I10 Essential (primary) hypertension: Secondary | ICD-10-CM | POA: Diagnosis not present

## 2021-10-18 DIAGNOSIS — I495 Sick sinus syndrome: Secondary | ICD-10-CM | POA: Diagnosis not present

## 2021-10-18 DIAGNOSIS — R001 Bradycardia, unspecified: Secondary | ICD-10-CM | POA: Diagnosis not present

## 2021-10-19 DIAGNOSIS — N183 Chronic kidney disease, stage 3 unspecified: Secondary | ICD-10-CM | POA: Diagnosis not present

## 2021-10-19 DIAGNOSIS — R2689 Other abnormalities of gait and mobility: Secondary | ICD-10-CM | POA: Diagnosis not present

## 2021-10-19 DIAGNOSIS — E7800 Pure hypercholesterolemia, unspecified: Secondary | ICD-10-CM | POA: Diagnosis not present

## 2021-10-19 DIAGNOSIS — R278 Other lack of coordination: Secondary | ICD-10-CM | POA: Diagnosis not present

## 2021-10-19 DIAGNOSIS — R296 Repeated falls: Secondary | ICD-10-CM | POA: Diagnosis not present

## 2021-10-19 DIAGNOSIS — M25561 Pain in right knee: Secondary | ICD-10-CM | POA: Diagnosis not present

## 2021-10-19 LAB — COMPREHENSIVE METABOLIC PANEL
Calcium: 9 (ref 8.7–10.7)
eGFR: 48

## 2021-10-19 LAB — BASIC METABOLIC PANEL
BUN: 25 — AB (ref 4–21)
Chloride: 100 (ref 99–108)
Creatinine: 1.4 — AB (ref 0.6–1.3)
Glucose: 83
Potassium: 4.6 mEq/L (ref 3.5–5.1)
Sodium: 131 — AB (ref 137–147)

## 2021-10-24 DIAGNOSIS — R278 Other lack of coordination: Secondary | ICD-10-CM | POA: Diagnosis not present

## 2021-10-24 DIAGNOSIS — R296 Repeated falls: Secondary | ICD-10-CM | POA: Diagnosis not present

## 2021-10-24 DIAGNOSIS — R2689 Other abnormalities of gait and mobility: Secondary | ICD-10-CM | POA: Diagnosis not present

## 2021-10-24 DIAGNOSIS — M25561 Pain in right knee: Secondary | ICD-10-CM | POA: Diagnosis not present

## 2021-10-26 DIAGNOSIS — R278 Other lack of coordination: Secondary | ICD-10-CM | POA: Diagnosis not present

## 2021-10-26 DIAGNOSIS — R2689 Other abnormalities of gait and mobility: Secondary | ICD-10-CM | POA: Diagnosis not present

## 2021-10-26 DIAGNOSIS — R296 Repeated falls: Secondary | ICD-10-CM | POA: Diagnosis not present

## 2021-10-26 DIAGNOSIS — M25561 Pain in right knee: Secondary | ICD-10-CM | POA: Diagnosis not present

## 2021-10-27 ENCOUNTER — Non-Acute Institutional Stay: Payer: Medicare Other | Admitting: Internal Medicine

## 2021-10-27 ENCOUNTER — Encounter: Payer: Self-pay | Admitting: Internal Medicine

## 2021-10-27 DIAGNOSIS — E7800 Pure hypercholesterolemia, unspecified: Secondary | ICD-10-CM | POA: Diagnosis not present

## 2021-10-27 DIAGNOSIS — F39 Unspecified mood [affective] disorder: Secondary | ICD-10-CM

## 2021-10-27 DIAGNOSIS — I480 Paroxysmal atrial fibrillation: Secondary | ICD-10-CM | POA: Diagnosis not present

## 2021-10-27 DIAGNOSIS — I495 Sick sinus syndrome: Secondary | ICD-10-CM | POA: Diagnosis not present

## 2021-10-27 DIAGNOSIS — I700 Atherosclerosis of aorta: Secondary | ICD-10-CM | POA: Diagnosis not present

## 2021-10-27 DIAGNOSIS — I714 Abdominal aortic aneurysm, without rupture, unspecified: Secondary | ICD-10-CM | POA: Diagnosis not present

## 2021-10-27 DIAGNOSIS — M1711 Unilateral primary osteoarthritis, right knee: Secondary | ICD-10-CM | POA: Diagnosis not present

## 2021-10-27 DIAGNOSIS — N401 Enlarged prostate with lower urinary tract symptoms: Secondary | ICD-10-CM

## 2021-10-27 DIAGNOSIS — N1831 Chronic kidney disease, stage 3a: Secondary | ICD-10-CM

## 2021-10-27 DIAGNOSIS — E039 Hypothyroidism, unspecified: Secondary | ICD-10-CM

## 2021-10-27 DIAGNOSIS — I10 Essential (primary) hypertension: Secondary | ICD-10-CM

## 2021-10-27 NOTE — Assessment & Plan Note (Signed)
Continue current dose of levothyroxine We will check free T4 yearly

## 2021-10-27 NOTE — Assessment & Plan Note (Signed)
-   Continue Eliquis 

## 2021-10-27 NOTE — Assessment & Plan Note (Signed)
Asymptomatic. 

## 2021-10-27 NOTE — Assessment & Plan Note (Signed)
Not on statin given age

## 2021-10-27 NOTE — Assessment & Plan Note (Signed)
Continue Tylenol as needed

## 2021-10-31 DIAGNOSIS — R2689 Other abnormalities of gait and mobility: Secondary | ICD-10-CM | POA: Diagnosis not present

## 2021-10-31 DIAGNOSIS — M25561 Pain in right knee: Secondary | ICD-10-CM | POA: Diagnosis not present

## 2021-10-31 DIAGNOSIS — R296 Repeated falls: Secondary | ICD-10-CM | POA: Diagnosis not present

## 2021-10-31 DIAGNOSIS — R278 Other lack of coordination: Secondary | ICD-10-CM | POA: Diagnosis not present

## 2021-11-02 DIAGNOSIS — R296 Repeated falls: Secondary | ICD-10-CM | POA: Diagnosis not present

## 2021-11-02 DIAGNOSIS — R278 Other lack of coordination: Secondary | ICD-10-CM | POA: Diagnosis not present

## 2021-11-02 DIAGNOSIS — M25561 Pain in right knee: Secondary | ICD-10-CM | POA: Diagnosis not present

## 2021-11-02 DIAGNOSIS — R2689 Other abnormalities of gait and mobility: Secondary | ICD-10-CM | POA: Diagnosis not present

## 2021-11-07 DIAGNOSIS — R278 Other lack of coordination: Secondary | ICD-10-CM | POA: Diagnosis not present

## 2021-11-07 DIAGNOSIS — R296 Repeated falls: Secondary | ICD-10-CM | POA: Diagnosis not present

## 2021-11-07 DIAGNOSIS — R2689 Other abnormalities of gait and mobility: Secondary | ICD-10-CM | POA: Diagnosis not present

## 2021-11-07 DIAGNOSIS — M25561 Pain in right knee: Secondary | ICD-10-CM | POA: Diagnosis not present

## 2021-11-09 DIAGNOSIS — R296 Repeated falls: Secondary | ICD-10-CM | POA: Diagnosis not present

## 2021-11-09 DIAGNOSIS — M25561 Pain in right knee: Secondary | ICD-10-CM | POA: Diagnosis not present

## 2021-11-09 DIAGNOSIS — R278 Other lack of coordination: Secondary | ICD-10-CM | POA: Diagnosis not present

## 2021-11-09 DIAGNOSIS — R2689 Other abnormalities of gait and mobility: Secondary | ICD-10-CM | POA: Diagnosis not present

## 2021-11-14 DIAGNOSIS — R278 Other lack of coordination: Secondary | ICD-10-CM | POA: Diagnosis not present

## 2021-11-14 DIAGNOSIS — R296 Repeated falls: Secondary | ICD-10-CM | POA: Diagnosis not present

## 2021-11-14 DIAGNOSIS — M25561 Pain in right knee: Secondary | ICD-10-CM | POA: Diagnosis not present

## 2021-11-14 DIAGNOSIS — R2689 Other abnormalities of gait and mobility: Secondary | ICD-10-CM | POA: Diagnosis not present

## 2021-11-16 DIAGNOSIS — M25561 Pain in right knee: Secondary | ICD-10-CM | POA: Diagnosis not present

## 2021-11-16 DIAGNOSIS — R2689 Other abnormalities of gait and mobility: Secondary | ICD-10-CM | POA: Diagnosis not present

## 2021-11-16 DIAGNOSIS — R278 Other lack of coordination: Secondary | ICD-10-CM | POA: Diagnosis not present

## 2021-11-16 DIAGNOSIS — R296 Repeated falls: Secondary | ICD-10-CM | POA: Diagnosis not present

## 2021-11-21 DIAGNOSIS — M25561 Pain in right knee: Secondary | ICD-10-CM | POA: Diagnosis not present

## 2021-11-21 DIAGNOSIS — R296 Repeated falls: Secondary | ICD-10-CM | POA: Diagnosis not present

## 2021-11-21 DIAGNOSIS — R278 Other lack of coordination: Secondary | ICD-10-CM | POA: Diagnosis not present

## 2021-11-21 DIAGNOSIS — R2689 Other abnormalities of gait and mobility: Secondary | ICD-10-CM | POA: Diagnosis not present

## 2021-11-21 DIAGNOSIS — I495 Sick sinus syndrome: Secondary | ICD-10-CM | POA: Diagnosis not present

## 2021-11-23 DIAGNOSIS — R278 Other lack of coordination: Secondary | ICD-10-CM | POA: Diagnosis not present

## 2021-11-23 DIAGNOSIS — R296 Repeated falls: Secondary | ICD-10-CM | POA: Diagnosis not present

## 2021-11-23 DIAGNOSIS — R2689 Other abnormalities of gait and mobility: Secondary | ICD-10-CM | POA: Diagnosis not present

## 2021-11-23 DIAGNOSIS — M25561 Pain in right knee: Secondary | ICD-10-CM | POA: Diagnosis not present

## 2021-11-28 DIAGNOSIS — R2689 Other abnormalities of gait and mobility: Secondary | ICD-10-CM | POA: Diagnosis not present

## 2021-11-28 DIAGNOSIS — R296 Repeated falls: Secondary | ICD-10-CM | POA: Diagnosis not present

## 2021-11-28 DIAGNOSIS — R278 Other lack of coordination: Secondary | ICD-10-CM | POA: Diagnosis not present

## 2021-11-28 DIAGNOSIS — M25561 Pain in right knee: Secondary | ICD-10-CM | POA: Diagnosis not present

## 2021-11-30 DIAGNOSIS — R278 Other lack of coordination: Secondary | ICD-10-CM | POA: Diagnosis not present

## 2021-11-30 DIAGNOSIS — M25561 Pain in right knee: Secondary | ICD-10-CM | POA: Diagnosis not present

## 2021-11-30 DIAGNOSIS — R2689 Other abnormalities of gait and mobility: Secondary | ICD-10-CM | POA: Diagnosis not present

## 2021-11-30 DIAGNOSIS — R296 Repeated falls: Secondary | ICD-10-CM | POA: Diagnosis not present

## 2021-12-05 DIAGNOSIS — M25561 Pain in right knee: Secondary | ICD-10-CM | POA: Diagnosis not present

## 2021-12-05 DIAGNOSIS — R2689 Other abnormalities of gait and mobility: Secondary | ICD-10-CM | POA: Diagnosis not present

## 2021-12-05 DIAGNOSIS — R278 Other lack of coordination: Secondary | ICD-10-CM | POA: Diagnosis not present

## 2021-12-05 DIAGNOSIS — R296 Repeated falls: Secondary | ICD-10-CM | POA: Diagnosis not present

## 2021-12-07 DIAGNOSIS — M25561 Pain in right knee: Secondary | ICD-10-CM | POA: Diagnosis not present

## 2021-12-07 DIAGNOSIS — R296 Repeated falls: Secondary | ICD-10-CM | POA: Diagnosis not present

## 2021-12-07 DIAGNOSIS — R2689 Other abnormalities of gait and mobility: Secondary | ICD-10-CM | POA: Diagnosis not present

## 2021-12-07 DIAGNOSIS — R278 Other lack of coordination: Secondary | ICD-10-CM | POA: Diagnosis not present

## 2021-12-12 DIAGNOSIS — R2689 Other abnormalities of gait and mobility: Secondary | ICD-10-CM | POA: Diagnosis not present

## 2021-12-12 DIAGNOSIS — R296 Repeated falls: Secondary | ICD-10-CM | POA: Diagnosis not present

## 2021-12-12 DIAGNOSIS — M25561 Pain in right knee: Secondary | ICD-10-CM | POA: Diagnosis not present

## 2021-12-12 DIAGNOSIS — R278 Other lack of coordination: Secondary | ICD-10-CM | POA: Diagnosis not present

## 2021-12-14 DIAGNOSIS — R296 Repeated falls: Secondary | ICD-10-CM | POA: Diagnosis not present

## 2021-12-14 DIAGNOSIS — M25561 Pain in right knee: Secondary | ICD-10-CM | POA: Diagnosis not present

## 2021-12-14 DIAGNOSIS — R278 Other lack of coordination: Secondary | ICD-10-CM | POA: Diagnosis not present

## 2021-12-14 DIAGNOSIS — R2689 Other abnormalities of gait and mobility: Secondary | ICD-10-CM | POA: Diagnosis not present

## 2021-12-19 DIAGNOSIS — R2689 Other abnormalities of gait and mobility: Secondary | ICD-10-CM | POA: Diagnosis not present

## 2021-12-19 DIAGNOSIS — R278 Other lack of coordination: Secondary | ICD-10-CM | POA: Diagnosis not present

## 2021-12-19 DIAGNOSIS — R296 Repeated falls: Secondary | ICD-10-CM | POA: Diagnosis not present

## 2021-12-19 DIAGNOSIS — M25561 Pain in right knee: Secondary | ICD-10-CM | POA: Diagnosis not present

## 2021-12-21 DIAGNOSIS — R2689 Other abnormalities of gait and mobility: Secondary | ICD-10-CM | POA: Diagnosis not present

## 2021-12-21 DIAGNOSIS — R296 Repeated falls: Secondary | ICD-10-CM | POA: Diagnosis not present

## 2021-12-21 DIAGNOSIS — R278 Other lack of coordination: Secondary | ICD-10-CM | POA: Diagnosis not present

## 2021-12-21 DIAGNOSIS — M25561 Pain in right knee: Secondary | ICD-10-CM | POA: Diagnosis not present

## 2021-12-26 DIAGNOSIS — R296 Repeated falls: Secondary | ICD-10-CM | POA: Diagnosis not present

## 2021-12-26 DIAGNOSIS — M25561 Pain in right knee: Secondary | ICD-10-CM | POA: Diagnosis not present

## 2021-12-26 DIAGNOSIS — R2689 Other abnormalities of gait and mobility: Secondary | ICD-10-CM | POA: Diagnosis not present

## 2021-12-26 DIAGNOSIS — R278 Other lack of coordination: Secondary | ICD-10-CM | POA: Diagnosis not present

## 2021-12-28 DIAGNOSIS — R296 Repeated falls: Secondary | ICD-10-CM | POA: Diagnosis not present

## 2021-12-28 DIAGNOSIS — R278 Other lack of coordination: Secondary | ICD-10-CM | POA: Diagnosis not present

## 2021-12-28 DIAGNOSIS — R2689 Other abnormalities of gait and mobility: Secondary | ICD-10-CM | POA: Diagnosis not present

## 2021-12-28 DIAGNOSIS — M25561 Pain in right knee: Secondary | ICD-10-CM | POA: Diagnosis not present

## 2022-01-02 DIAGNOSIS — R2689 Other abnormalities of gait and mobility: Secondary | ICD-10-CM | POA: Diagnosis not present

## 2022-01-02 DIAGNOSIS — R296 Repeated falls: Secondary | ICD-10-CM | POA: Diagnosis not present

## 2022-01-02 DIAGNOSIS — R278 Other lack of coordination: Secondary | ICD-10-CM | POA: Diagnosis not present

## 2022-01-02 DIAGNOSIS — M25561 Pain in right knee: Secondary | ICD-10-CM | POA: Diagnosis not present

## 2022-01-04 DIAGNOSIS — M25561 Pain in right knee: Secondary | ICD-10-CM | POA: Diagnosis not present

## 2022-01-04 DIAGNOSIS — R278 Other lack of coordination: Secondary | ICD-10-CM | POA: Diagnosis not present

## 2022-01-04 DIAGNOSIS — R2689 Other abnormalities of gait and mobility: Secondary | ICD-10-CM | POA: Diagnosis not present

## 2022-01-04 DIAGNOSIS — R296 Repeated falls: Secondary | ICD-10-CM | POA: Diagnosis not present

## 2022-01-09 DIAGNOSIS — M25561 Pain in right knee: Secondary | ICD-10-CM | POA: Diagnosis not present

## 2022-01-09 DIAGNOSIS — R296 Repeated falls: Secondary | ICD-10-CM | POA: Diagnosis not present

## 2022-01-09 DIAGNOSIS — R2689 Other abnormalities of gait and mobility: Secondary | ICD-10-CM | POA: Diagnosis not present

## 2022-01-09 DIAGNOSIS — R278 Other lack of coordination: Secondary | ICD-10-CM | POA: Diagnosis not present

## 2022-01-30 DIAGNOSIS — H35373 Puckering of macula, bilateral: Secondary | ICD-10-CM | POA: Diagnosis not present

## 2022-01-30 DIAGNOSIS — Z961 Presence of intraocular lens: Secondary | ICD-10-CM | POA: Diagnosis not present

## 2022-02-20 ENCOUNTER — Non-Acute Institutional Stay: Payer: Medicare Other | Admitting: Nurse Practitioner

## 2022-02-20 ENCOUNTER — Encounter: Payer: Self-pay | Admitting: Nurse Practitioner

## 2022-02-20 DIAGNOSIS — E871 Hypo-osmolality and hyponatremia: Secondary | ICD-10-CM | POA: Diagnosis not present

## 2022-02-20 DIAGNOSIS — E039 Hypothyroidism, unspecified: Secondary | ICD-10-CM | POA: Diagnosis not present

## 2022-02-20 DIAGNOSIS — I480 Paroxysmal atrial fibrillation: Secondary | ICD-10-CM | POA: Diagnosis not present

## 2022-02-20 DIAGNOSIS — R296 Repeated falls: Secondary | ICD-10-CM | POA: Diagnosis not present

## 2022-02-20 DIAGNOSIS — I10 Essential (primary) hypertension: Secondary | ICD-10-CM | POA: Diagnosis not present

## 2022-02-20 DIAGNOSIS — R600 Localized edema: Secondary | ICD-10-CM

## 2022-02-20 DIAGNOSIS — R151 Fecal smearing: Secondary | ICD-10-CM

## 2022-02-20 DIAGNOSIS — F39 Unspecified mood [affective] disorder: Secondary | ICD-10-CM

## 2022-02-20 NOTE — Progress Notes (Signed)
Location:  Other Nursing Home Room Number: [id number] of Service:  ALF (13)  Dewayne Shorter, MD  Patient Care Team: Dewayne Shorter, MD as PCP - General (Family Medicine) Lauree Chandler, NP as Nurse Practitioner (Geriatric Medicine)  Extended Emergency Contact Information Primary Emergency Contact: [facility] Address: [place] [address] [name] of [place] Phone: [phone] Mobile Phone: [phone] Relation: Son Secondary Emergency Contact: Leanora Ivanoff [place] of [place] Phone: [phone] Mobile Phone: [phone] Relation: Son  Goals of care: Advanced Directive information    02/20/2022    3:23 PM  Advanced Directives  Does Patient Have a Medical Advance Directive? Yes  Type of Advance Directive Living will;Out of facility DNR (pink MOST or yellow form)  Does patient want to make changes to medical advance directive? No - Patient declined  Pre-existing out of facility DNR order (yellow form or pink MOST form) Yellow form placed in chart (order not valid for inpatient use)     Chief Complaint  Patient presents with   Medical Management of Chronic Issues    Routine follow up. Leg swelling   Immunizations    COVID vaccine,tetanus/tdap vaccine, zoster vaccine, pneumonia vaccine and flu vaccine due    HPI:  Pt is a 88 y.o. male seen today for routine follow up.  Nursing staff reports he has not been himself for the past few days. Increase fatigue and weakness. He has had several falls- one prior to visit today. No injury noted.   He has  hemorrhoids and incontinent of bowel and bladder and was trying to reach a pad for his adult brief when he fell.    He has had worsening lower extremity edema that has progressively gotten worse. No shortness of breath noted, cough or congestion noted.   Htn- blood pressure controlled, he is taking norvasc and lisinopril.   A fib- rate controlled, continues on  eliquis for anticoagulation   Hypothyroid-- currently on synthroid 112 mcg  Mood disorder- stable at this time, he had a reduction in zoloft over the summer due to hyponatremia. Continues on zoloft 25 mg daily   Past Medical History:  Diagnosis Date   AAA (abdominal aortic aneurysm) (HCC)    Actinic keratosis 07/22/2017   left lateral crown, midline crown, right of midline crown   Aortic atherosclerosis (HCC)    Atrial fibrillation ([place]) 03/2016   brief spell   B12 deficiency    Basal cell carcinoma 10/30/2016   L lat crown   Bradycardia    Bradycardia 02/2017   Pacer placed   Chronic kidney disease (CKD), stage III (moderate) (HCC)    Hyperlipidemia    Hypertension    Hypothyroidism    Osteoarthritis    Prostate cancer ([place]) 2001   Rad tx's + seed implants   Renal cancer, left ([place]) 03/2016   Left Renal Nephrectomy   Sick sinus syndrome ([place])    Pacemaker   Past Surgical History:  Procedure Laterality Date   CATARACT EXTRACTION, BILATERAL     COLONOSCOPY     EXCISIONAL HEMORRHOIDECTOMY     INSERTION PROSTATE RADIATION SEED     LAPAROSCOPIC NEPHRECTOMY, HAND ASSISTED Left 03/08/2016   Procedure: HAND ASSISTED LAPAROSCOPIC NEPHRECTOMY;  Surgeon: Nickie Retort, MD;  Location: [facility];  Service: Urology;  Laterality: Left;   LAPAROTOMY N/A 03/13/2016   Procedure: EXPLORATORY LAPAROTOMY;  Surgeon: Festus Aloe, MD;  Location: [facility];  Service:  Urology;  Laterality: N/A;   PACEMAKER INSERTION N/A 02/20/2017   Procedure: INSERTION PACEMAKER;  Surgeon: Isaias Cowman, MD;  Location: [facility];  Service: Cardiovascular;  Laterality: N/A;    No Known Allergies  Outpatient Encounter Medications as of 02/20/2022  Medication Sig   acetaminophen (TYLENOL) 325 MG tablet Take 650 mg by mouth every 4 (four) hours as needed.   acetaminophen (TYLENOL) 500 MG tablet Take 1,000 mg by mouth daily. And '1000mg'$  twice a day prn   amLODipine (NORVASC) 5 MG tablet Take 5 mg by  mouth daily.   Bismuth Subsalicylate (KAOPECTATE PO) Take 10 mLs by mouth as needed. For diarrhea   carbamide peroxide (DEBROX) 6.5 % OTIC solution Place 5 drops into both ears as needed.   cetirizine (ZYRTEC) 10 MG tablet Take 10 mg by mouth as needed for allergies.   Cyanocobalamin 1000 MCG TBCR Take 1,000 mcg by mouth daily.    dextromethorphan-guaiFENesin (ROBITUSSIN-DM) 10-100 MG/5ML liquid Take 10 mLs by mouth every 4 (four) hours as needed for cough.   diphenhydrAMINE HCl (BENADRYL ALLERGY PO) Take 1 tablet by mouth.   ELIQUIS 2.5 MG TABS tablet Take 2.5 mg by mouth 2 (two) times daily.   Glucose 15 GM/32ML GEL Take 1 packet by mouth as needed. For low blodd sugar   levothyroxine (SYNTHROID, LEVOTHROID) 112 MCG tablet Take 112 mcg by mouth daily before breakfast.   lisinopril (ZESTRIL) 10 MG tablet Take 10 mg by mouth daily.   magnesium hydroxide (MILK OF MAGNESIA) 400 MG/5ML suspension Take 30 mLs by mouth daily as needed for mild constipation.   Multiple Vitamin (MULTI-VITAMINS) TABS Take 1 tablet by mouth daily.   mupirocin ointment (BACTROBAN) 2 % 1 Application daily. Apply to scalp daily   nystatin (MYCOSTATIN/NYSTOP) powder Apply 1 Application topically as needed.   ondansetron (ZOFRAN) 4 MG tablet Take 4 mg by mouth as needed for nausea or vomiting. Up to three times a day   sertraline (ZOLOFT) 25 MG tablet Take 1 tablet (25 mg total) by mouth daily.   Zinc Oxide (DESITIN) 13 % CREA Apply topically. Apply to rectum topically as needed   [DISCONTINUED] Magnesium Hydroxide (MILK OF MAGNESIA PO) Take by mouth.   [DISCONTINUED] omeprazole (PRILOSEC) 20 MG capsule Take 1 capsule (20 mg total) by mouth daily.   [DISCONTINUED] PROCTO-MED HC 2.5 % rectal cream Apply 1 application. topically as directed. (Patient not taking: Reported on 10/27/2021)   No facility-administered encounter medications on file as of 02/20/2022.    Review of Systems  Constitutional:  Negative for activity  change, appetite change, fatigue and unexpected weight change.  HENT:  Negative for congestion and hearing loss.   Eyes: Negative.   Respiratory:  Negative for cough and shortness of breath.   Cardiovascular:  Negative for chest pain, palpitations and leg swelling.  Gastrointestinal:  Negative for abdominal pain, constipation and diarrhea.  Genitourinary:  Negative for difficulty urinating and dysuria.  Musculoskeletal:  Negative for arthralgias and myalgias.  Skin:  Negative for color change and wound.  Neurological:  Positive for weakness. Negative for dizziness.  Psychiatric/Behavioral:  Negative for agitation, behavioral problems and confusion.     Immunization History  Administered Date(s) Administered   Influenza, High Dose Seasonal PF 01/25/2017, 03/04/2018   Influenza-Unspecified 03/14/2015, 02/21/2021   Moderna Sars-Covid-2 Vaccination 05/18/2019, 06/15/2019   Pneumococcal Conjugate-13 07/09/2014, 12/05/2015   Pneumococcal-Unspecified 12/04/2016   Tdap 04/12/2018   Unspecified SARS-COV-2 Vaccination 10/03/2021   Pertinent  Health Maintenance Due  Topic Date  Due   INFLUENZA VACCINE  12/05/2021      04/26/2020   11:00 AM 04/26/2020    8:00 PM 04/27/2020   11:00 AM 04/27/2020    7:50 PM 04/28/2020    8:00 AM  Fall Risk  Patient Fall Risk Level Moderate fall risk Moderate fall risk Moderate fall risk High fall risk High fall risk   Functional Status Survey:    Vitals:   02/20/22 1448  BP: 130/77  Pulse: 78  Resp: 20  Temp: 97.7 F (36.5 C)  SpO2: 98%  Weight: 188 lb (85.3 kg)  Height: '5\' 9"'$  (1.753 m)   Body mass index is 27.76 kg/m. Physical Exam Constitutional:      General: He is not in acute distress.    Appearance: He is well-developed. He is not diaphoretic.  HENT:     Head: Normocephalic and atraumatic.     Right Ear: External ear normal.     Left Ear: External ear normal.     Mouth/Throat:     Pharynx: No oropharyngeal exudate.  Eyes:      Conjunctiva/sclera: Conjunctivae normal.     Pupils: Pupils are equal, round, and reactive to light.  Cardiovascular:     Rate and Rhythm: Normal rate. Rhythm irregular.     Heart sounds: Normal heart sounds.  Pulmonary:     Effort: Pulmonary effort is normal.     Breath sounds: Normal breath sounds.  Abdominal:     General: Bowel sounds are normal.     Palpations: Abdomen is soft.  Musculoskeletal:        General: No tenderness.     Cervical back: Normal range of motion and neck supple.     Right lower leg: Edema (2+ pitting) present.     Left lower leg: Edema (2+ pitting) present.  Skin:    General: Skin is warm and dry.  Neurological:     Mental Status: He is alert and oriented to person, place, and time.  Psychiatric:        Mood and Affect: Mood normal.        Behavior: Behavior normal.     Labs reviewed: Recent Labs    10/19/21 0000  NA 131*  K 4.6  CL 100  BUN 25*  CREATININE 1.4*  CALCIUM 9.0   No results for input(s): "AST", "ALT", "ALKPHOS", "BILITOT", "PROT", "ALBUMIN" in the last 8760 hours. No results for input(s): "WBC", "NEUTROABS", "HGB", "HCT", "MCV", "PLT" in the last 8760 hours. No results found for: "TSH" No results found for: "HGBA1C" No results found for: "CHOL", "HDL", "LDLCALC", "LDLDIRECT", "TRIG", "CHOLHDL"  Significant Diagnostic Results in last 30 days:  No results found.  Assessment/Plan 1. Paroxysmal atrial fibrillation (HCC) Rate controlled on current   2. Primary hypertension -Blood pressure well controlled, goal bp <140/90 -will stop Norvasc due to LE edema and continue to have staff monitor bp weekly, can increase lisinopril if needed follow metabolic panel  3. Hyponatremia -noted on last labs, will re-evaluate at this time  4. Acquired hypothyroidism -follow up TSH -continues on synthroid 112 mcg  5. Lower extremity edema -worsening LE --encouraged to elevate legs above level of heart as tolerates, compression hose as  tolerates (on in am, off in pm) -will stop norvasc to see if this is contributing  6. Mood disorder ([place]) -stable continues zoloft for now, if sodium level remains low consider stopping.   7. Frequent falls PT/OT consult. Also he is on eliquis and high risk for  bleeding. If continues may need to consider DC eliquis.   8. Fecal smearing -add benefiber daily    Towana Stenglein [facility], [facility] [phone]

## 2022-02-22 ENCOUNTER — Telehealth: Payer: Self-pay | Admitting: Nurse Practitioner

## 2022-02-22 DIAGNOSIS — I10 Essential (primary) hypertension: Secondary | ICD-10-CM | POA: Diagnosis not present

## 2022-02-22 DIAGNOSIS — E871 Hypo-osmolality and hyponatremia: Secondary | ICD-10-CM | POA: Diagnosis not present

## 2022-02-22 DIAGNOSIS — E039 Hypothyroidism, unspecified: Secondary | ICD-10-CM | POA: Diagnosis not present

## 2022-02-22 DIAGNOSIS — I480 Paroxysmal atrial fibrillation: Secondary | ICD-10-CM | POA: Diagnosis not present

## 2022-02-22 DIAGNOSIS — R600 Localized edema: Secondary | ICD-10-CM | POA: Diagnosis not present

## 2022-02-22 NOTE — Telephone Encounter (Signed)
Sodium level of 121 resulted back today. Pt has had multiple falls in his assisted living apartment, advised to go to hospital due to critical lab, at risk for seizure, falls.   Called EMT at facility to help facilitate transfer to hospital. EMS arrived and told patient about extended wait time and pt declined transport. [facility] staff made aware and attempting to get in touch with family to update.   I attempted to call son Frank Bartlett and daughter Frank Bartlett to update but no answer.

## 2022-02-23 ENCOUNTER — Inpatient Hospital Stay: Payer: Medicare Other

## 2022-02-23 ENCOUNTER — Emergency Department: Payer: Medicare Other

## 2022-02-23 ENCOUNTER — Other Ambulatory Visit: Payer: Self-pay

## 2022-02-23 ENCOUNTER — Inpatient Hospital Stay
Admission: EM | Admit: 2022-02-23 | Discharge: 2022-03-01 | DRG: 291 | Disposition: A | Discharge status: Skilled Nursing Facility | Payer: Medicare Other | Source: Skilled Nursing Facility | Attending: Internal Medicine | Admitting: Internal Medicine

## 2022-02-23 DIAGNOSIS — I340 Nonrheumatic mitral (valve) insufficiency: Secondary | ICD-10-CM | POA: Diagnosis not present

## 2022-02-23 DIAGNOSIS — Z8546 Personal history of malignant neoplasm of prostate: Secondary | ICD-10-CM | POA: Diagnosis not present

## 2022-02-23 DIAGNOSIS — Z923 Personal history of irradiation: Secondary | ICD-10-CM

## 2022-02-23 DIAGNOSIS — I5043 Acute on chronic combined systolic (congestive) and diastolic (congestive) heart failure: Secondary | ICD-10-CM

## 2022-02-23 DIAGNOSIS — Z79890 Hormone replacement therapy (postmenopausal): Secondary | ICD-10-CM

## 2022-02-23 DIAGNOSIS — Z905 Acquired absence of kidney: Secondary | ICD-10-CM

## 2022-02-23 DIAGNOSIS — R600 Localized edema: Secondary | ICD-10-CM | POA: Diagnosis not present

## 2022-02-23 DIAGNOSIS — E039 Hypothyroidism, unspecified: Secondary | ICD-10-CM | POA: Diagnosis present

## 2022-02-23 DIAGNOSIS — I495 Sick sinus syndrome: Secondary | ICD-10-CM | POA: Diagnosis present

## 2022-02-23 DIAGNOSIS — E222 Syndrome of inappropriate secretion of antidiuretic hormone: Secondary | ICD-10-CM | POA: Diagnosis not present

## 2022-02-23 DIAGNOSIS — I509 Heart failure, unspecified: Secondary | ICD-10-CM

## 2022-02-23 DIAGNOSIS — N1831 Chronic kidney disease, stage 3a: Secondary | ICD-10-CM | POA: Diagnosis present

## 2022-02-23 DIAGNOSIS — F32A Depression, unspecified: Secondary | ICD-10-CM | POA: Diagnosis present

## 2022-02-23 DIAGNOSIS — D649 Anemia, unspecified: Secondary | ICD-10-CM | POA: Diagnosis present

## 2022-02-23 DIAGNOSIS — Z85828 Personal history of other malignant neoplasm of skin: Secondary | ICD-10-CM | POA: Diagnosis not present

## 2022-02-23 DIAGNOSIS — R5381 Other malaise: Secondary | ICD-10-CM | POA: Diagnosis not present

## 2022-02-23 DIAGNOSIS — R278 Other lack of coordination: Secondary | ICD-10-CM | POA: Diagnosis not present

## 2022-02-23 DIAGNOSIS — Z66 Do not resuscitate: Secondary | ICD-10-CM | POA: Diagnosis present

## 2022-02-23 DIAGNOSIS — R262 Difficulty in walking, not elsewhere classified: Secondary | ICD-10-CM | POA: Diagnosis not present

## 2022-02-23 DIAGNOSIS — Z85528 Personal history of other malignant neoplasm of kidney: Secondary | ICD-10-CM | POA: Diagnosis not present

## 2022-02-23 DIAGNOSIS — I2510 Atherosclerotic heart disease of native coronary artery without angina pectoris: Secondary | ICD-10-CM | POA: Diagnosis present

## 2022-02-23 DIAGNOSIS — Z950 Presence of cardiac pacemaker: Secondary | ICD-10-CM

## 2022-02-23 DIAGNOSIS — E876 Hypokalemia: Secondary | ICD-10-CM | POA: Diagnosis not present

## 2022-02-23 DIAGNOSIS — R2681 Unsteadiness on feet: Secondary | ICD-10-CM | POA: Diagnosis not present

## 2022-02-23 DIAGNOSIS — Z803 Family history of malignant neoplasm of breast: Secondary | ICD-10-CM

## 2022-02-23 DIAGNOSIS — I130 Hypertensive heart and chronic kidney disease with heart failure and stage 1 through stage 4 chronic kidney disease, or unspecified chronic kidney disease: Secondary | ICD-10-CM | POA: Diagnosis not present

## 2022-02-23 DIAGNOSIS — D509 Iron deficiency anemia, unspecified: Secondary | ICD-10-CM | POA: Diagnosis present

## 2022-02-23 DIAGNOSIS — I480 Paroxysmal atrial fibrillation: Secondary | ICD-10-CM | POA: Diagnosis not present

## 2022-02-23 DIAGNOSIS — Z800 Family history of malignant neoplasm of digestive organs: Secondary | ICD-10-CM

## 2022-02-23 DIAGNOSIS — Z7189 Other specified counseling: Secondary | ICD-10-CM | POA: Diagnosis not present

## 2022-02-23 DIAGNOSIS — Z741 Need for assistance with personal care: Secondary | ICD-10-CM | POA: Diagnosis not present

## 2022-02-23 DIAGNOSIS — Z9181 History of falling: Secondary | ICD-10-CM

## 2022-02-23 DIAGNOSIS — Z743 Need for continuous supervision: Secondary | ICD-10-CM | POA: Diagnosis not present

## 2022-02-23 DIAGNOSIS — R531 Weakness: Secondary | ICD-10-CM

## 2022-02-23 DIAGNOSIS — R6889 Other general symptoms and signs: Secondary | ICD-10-CM | POA: Diagnosis not present

## 2022-02-23 DIAGNOSIS — Z79899 Other long term (current) drug therapy: Secondary | ICD-10-CM

## 2022-02-23 DIAGNOSIS — E785 Hyperlipidemia, unspecified: Secondary | ICD-10-CM | POA: Diagnosis present

## 2022-02-23 DIAGNOSIS — I5023 Acute on chronic systolic (congestive) heart failure: Secondary | ICD-10-CM | POA: Diagnosis present

## 2022-02-23 DIAGNOSIS — I700 Atherosclerosis of aorta: Secondary | ICD-10-CM | POA: Diagnosis present

## 2022-02-23 DIAGNOSIS — W19XXXA Unspecified fall, initial encounter: Secondary | ICD-10-CM | POA: Diagnosis present

## 2022-02-23 DIAGNOSIS — M1990 Unspecified osteoarthritis, unspecified site: Secondary | ICD-10-CM | POA: Diagnosis not present

## 2022-02-23 DIAGNOSIS — I714 Abdominal aortic aneurysm, without rupture, unspecified: Secondary | ICD-10-CM | POA: Diagnosis not present

## 2022-02-23 DIAGNOSIS — I110 Hypertensive heart disease with heart failure: Secondary | ICD-10-CM | POA: Diagnosis not present

## 2022-02-23 DIAGNOSIS — R296 Repeated falls: Secondary | ICD-10-CM | POA: Diagnosis present

## 2022-02-23 DIAGNOSIS — J3489 Other specified disorders of nose and nasal sinuses: Secondary | ICD-10-CM | POA: Diagnosis not present

## 2022-02-23 DIAGNOSIS — I10 Essential (primary) hypertension: Secondary | ICD-10-CM | POA: Diagnosis present

## 2022-02-23 DIAGNOSIS — R609 Edema, unspecified: Secondary | ICD-10-CM | POA: Diagnosis not present

## 2022-02-23 DIAGNOSIS — Z8249 Family history of ischemic heart disease and other diseases of the circulatory system: Secondary | ICD-10-CM

## 2022-02-23 DIAGNOSIS — J90 Pleural effusion, not elsewhere classified: Secondary | ICD-10-CM | POA: Diagnosis not present

## 2022-02-23 DIAGNOSIS — E871 Hypo-osmolality and hyponatremia: Secondary | ICD-10-CM | POA: Diagnosis not present

## 2022-02-23 DIAGNOSIS — S0990XA Unspecified injury of head, initial encounter: Secondary | ICD-10-CM | POA: Diagnosis not present

## 2022-02-23 DIAGNOSIS — I5031 Acute diastolic (congestive) heart failure: Secondary | ICD-10-CM | POA: Diagnosis not present

## 2022-02-23 DIAGNOSIS — Z7901 Long term (current) use of anticoagulants: Secondary | ICD-10-CM

## 2022-02-23 DIAGNOSIS — M6281 Muscle weakness (generalized): Secondary | ICD-10-CM | POA: Diagnosis not present

## 2022-02-23 DIAGNOSIS — Z5320 Procedure and treatment not carried out because of patient's decision for unspecified reasons: Secondary | ICD-10-CM | POA: Diagnosis present

## 2022-02-23 DIAGNOSIS — K649 Unspecified hemorrhoids: Secondary | ICD-10-CM | POA: Diagnosis present

## 2022-02-23 DIAGNOSIS — Z7401 Bed confinement status: Secondary | ICD-10-CM | POA: Diagnosis not present

## 2022-02-23 LAB — PROTIME-INR
INR: 1.2 (ref 0.8–1.2)
Prothrombin Time: 15.4 seconds — ABNORMAL HIGH (ref 11.4–15.2)

## 2022-02-23 LAB — CBC
HCT: 31.8 % — ABNORMAL LOW (ref 39.0–52.0)
Hemoglobin: 10.6 g/dL — ABNORMAL LOW (ref 13.0–17.0)
MCH: 28.6 pg (ref 26.0–34.0)
MCHC: 33.3 g/dL (ref 30.0–36.0)
MCV: 85.7 fL (ref 80.0–100.0)
Platelets: 258 10*3/uL (ref 150–400)
RBC: 3.71 MIL/uL — ABNORMAL LOW (ref 4.22–5.81)
RDW: 14.5 % (ref 11.5–15.5)
WBC: 4.6 10*3/uL (ref 4.0–10.5)
nRBC: 0 % (ref 0.0–0.2)

## 2022-02-23 LAB — COMPREHENSIVE METABOLIC PANEL
ALT: 29 U/L (ref 0–44)
AST: 31 U/L (ref 15–41)
Albumin: 3.7 g/dL (ref 3.5–5.0)
Alkaline Phosphatase: 90 U/L (ref 38–126)
Anion gap: 9 (ref 5–15)
BUN: 22 mg/dL (ref 8–23)
CO2: 21 mmol/L — ABNORMAL LOW (ref 22–32)
Calcium: 8.9 mg/dL (ref 8.9–10.3)
Chloride: 96 mmol/L — ABNORMAL LOW (ref 98–111)
Creatinine, Ser: 1.15 mg/dL (ref 0.61–1.24)
GFR, Estimated: 59 mL/min — ABNORMAL LOW (ref >60)
Glucose, Bld: 101 mg/dL — ABNORMAL HIGH (ref 70–99)
Potassium: 4.1 mmol/L (ref 3.5–5.1)
Sodium: 126 mmol/L — ABNORMAL LOW (ref 135–145)
Total Bilirubin: 0.9 mg/dL (ref 0.3–1.2)
Total Protein: 7.2 g/dL (ref 6.5–8.1)

## 2022-02-23 LAB — CBC WITH DIFFERENTIAL/PLATELET
Abs Immature Granulocytes: 0.02 10*3/uL (ref 0.00–0.07)
Basophils Absolute: 0 10*3/uL (ref 0.0–0.1)
Basophils Relative: 1 %
Eosinophils Absolute: 0.2 10*3/uL (ref 0.0–0.5)
Eosinophils Relative: 3 %
HCT: 33.4 % — ABNORMAL LOW (ref 39.0–52.0)
Hemoglobin: 10.8 g/dL — ABNORMAL LOW (ref 13.0–17.0)
Immature Granulocytes: 0 %
Lymphocytes Relative: 9 %
Lymphs Abs: 0.5 10*3/uL — ABNORMAL LOW (ref 0.7–4.0)
MCH: 28.1 pg (ref 26.0–34.0)
MCHC: 32.3 g/dL (ref 30.0–36.0)
MCV: 87 fL (ref 80.0–100.0)
Monocytes Absolute: 0.6 10*3/uL (ref 0.1–1.0)
Monocytes Relative: 13 %
Neutro Abs: 3.6 10*3/uL (ref 1.7–7.7)
Neutrophils Relative %: 74 %
Platelets: 249 10*3/uL (ref 150–400)
RBC: 3.84 MIL/uL — ABNORMAL LOW (ref 4.22–5.81)
RDW: 14.6 % (ref 11.5–15.5)
WBC: 4.8 10*3/uL (ref 4.0–10.5)
nRBC: 0 % (ref 0.0–0.2)

## 2022-02-23 LAB — APTT: aPTT: 41 seconds — ABNORMAL HIGH (ref 24–36)

## 2022-02-23 LAB — BASIC METABOLIC PANEL
Anion gap: 9 (ref 5–15)
BUN: 21 mg/dL (ref 8–23)
CO2: 21 mmol/L — ABNORMAL LOW (ref 22–32)
Calcium: 8.9 mg/dL (ref 8.9–10.3)
Chloride: 95 mmol/L — ABNORMAL LOW (ref 98–111)
Creatinine, Ser: 1.24 mg/dL (ref 0.61–1.24)
GFR, Estimated: 54 mL/min — ABNORMAL LOW (ref >60)
Glucose, Bld: 112 mg/dL — ABNORMAL HIGH (ref 70–99)
Potassium: 4 mmol/L (ref 3.5–5.1)
Sodium: 125 mmol/L — ABNORMAL LOW (ref 135–145)

## 2022-02-23 LAB — OSMOLALITY: Osmolality: 262 mOsm/kg — ABNORMAL LOW (ref 275–295)

## 2022-02-23 LAB — TROPONIN I (HIGH SENSITIVITY)
Troponin I (High Sensitivity): 14 ng/L (ref <18)
Troponin I (High Sensitivity): 14 ng/L (ref <18)

## 2022-02-23 LAB — MAGNESIUM: Magnesium: 2 mg/dL (ref 1.7–2.4)

## 2022-02-23 LAB — TSH: TSH: 8.962 u[IU]/mL — ABNORMAL HIGH (ref 0.350–4.500)

## 2022-02-23 LAB — PHOSPHORUS: Phosphorus: 3.4 mg/dL (ref 2.5–4.6)

## 2022-02-23 LAB — BRAIN NATRIURETIC PEPTIDE: B Natriuretic Peptide: 1122.7 pg/mL — ABNORMAL HIGH (ref 0.0–100.0)

## 2022-02-23 MED ORDER — ONDANSETRON HCL 4 MG/2ML IJ SOLN: 4.0000 mg | Freq: Three times a day (TID) | INTRAMUSCULAR | Status: DC | PRN

## 2022-02-23 MED ORDER — SERTRALINE HCL 50 MG PO TABS
25.0000 mg | ORAL_TABLET | Freq: Every day | ORAL | Status: DC
  Administered 2022-02-24 – 2022-02-26 (×3): 25 mg via ORAL
  Filled 2022-02-23 (×3): qty 1

## 2022-02-23 MED ORDER — SODIUM CHLORIDE 1 G PO TABS
1.0000 g | ORAL_TABLET | Freq: Two times a day (BID) | ORAL | Status: DC
  Filled 2022-02-23: qty 1

## 2022-02-23 MED ORDER — VITAMIN B-12 1000 MCG PO TABS
1000.0000 ug | ORAL_TABLET | Freq: Every day | ORAL | Status: DC
  Administered 2022-02-24 – 2022-02-26 (×3): 1000 ug via ORAL
  Filled 2022-02-23 (×3): qty 1

## 2022-02-23 MED ORDER — MUPIROCIN 2 % EX OINT
1.0000 | TOPICAL_OINTMENT | Freq: Every day | CUTANEOUS | Status: DC
  Administered 2022-02-25 – 2022-03-01 (×5): 1 via TOPICAL
  Filled 2022-02-23 (×2): qty 22

## 2022-02-23 MED ORDER — GUAIFENESIN 100 MG/5ML PO LIQD: 10.0000 mL | ORAL | Status: DC | PRN

## 2022-02-23 MED ORDER — LEVOTHYROXINE SODIUM 112 MCG PO TABS
112.0000 ug | ORAL_TABLET | Freq: Every day | ORAL | Status: DC
  Administered 2022-02-24 – 2022-02-25 (×2): 112 ug via ORAL
  Filled 2022-02-23 (×2): qty 1

## 2022-02-23 MED ORDER — ADULT MULTIVITAMIN W/MINERALS CH
1.0000 | ORAL_TABLET | Freq: Every day | ORAL | Status: DC
  Administered 2022-02-24 – 2022-02-26 (×3): 1 via ORAL
  Filled 2022-02-23 (×3): qty 1

## 2022-02-23 MED ORDER — LORATADINE 10 MG PO TABS: 10.0000 mg | ORAL_TABLET | Freq: Every day | ORAL | Status: DC | PRN

## 2022-02-23 MED ORDER — ACETAMINOPHEN 325 MG PO TABS
650.0000 mg | ORAL_TABLET | Freq: Four times a day (QID) | ORAL | Status: DC | PRN
  Administered 2022-02-25 (×2): 650 mg via ORAL
  Filled 2022-02-23 (×2): qty 2

## 2022-02-23 MED ORDER — ALBUTEROL SULFATE (2.5 MG/3ML) 0.083% IN NEBU: 2.5000 mL | INHALATION_SOLUTION | RESPIRATORY_TRACT | Status: DC | PRN

## 2022-02-23 MED ORDER — HYDRALAZINE HCL 20 MG/ML IJ SOLN: 5.0000 mg | INTRAMUSCULAR | Status: DC | PRN

## 2022-02-23 MED ORDER — FUROSEMIDE 10 MG/ML IJ SOLN
40.0000 mg | Freq: Once | INTRAMUSCULAR | Status: AC
  Administered 2022-02-23: 40 mg via INTRAVENOUS
  Filled 2022-02-23: qty 4

## 2022-02-23 MED ORDER — AMLODIPINE BESYLATE 5 MG PO TABS
5.0000 mg | ORAL_TABLET | Freq: Every day | ORAL | Status: DC
  Administered 2022-02-23 – 2022-02-26 (×4): 5 mg via ORAL
  Filled 2022-02-23 (×4): qty 1

## 2022-02-23 MED ORDER — WITCH HAZEL-GLYCERIN EX PADS: MEDICATED_PAD | CUTANEOUS | Status: DC | PRN

## 2022-02-23 MED ORDER — BISMUTH SUBSALICYLATE 262 MG PO CHEW: 1.0000 | CHEWABLE_TABLET | ORAL | Status: DC | PRN

## 2022-02-23 MED ORDER — SODIUM CHLORIDE 1 G PO TABS
1.0000 g | ORAL_TABLET | Freq: Two times a day (BID) | ORAL | Status: DC
  Administered 2022-02-23 – 2022-02-25 (×4): 1 g via ORAL
  Filled 2022-02-23 (×4): qty 1

## 2022-02-23 MED ORDER — FUROSEMIDE 10 MG/ML IJ SOLN
20.0000 mg | Freq: Two times a day (BID) | INTRAMUSCULAR | Status: DC
  Administered 2022-02-23: 20 mg via INTRAVENOUS
  Filled 2022-02-23: qty 4

## 2022-02-23 NOTE — ED Provider Notes (Signed)
[facility] Provider Note    Event Date/Time   First MD Initiated Contact with Patient 02/23/22 [phone]     (approximate)   History   Leg Swelling   HPI  Frank Bartlett is a 88 y.o. male with a history of atrial fibrillation, CKD, hypertension, hypothyroidism, AAA, and hyperlipidemia who presents with bilateral leg swelling with some weeping as well as generalized weakness, several falls over the last few days, and hyponatremia seen on recent labs at his facility.  The patient himself endorses some generalized weakness and lightheadedness especially when standing.  He denies any chest pain or difficulty breathing.  He agrees that the lower extremity swelling is somewhat worse than baseline.  The patient denies any headache, vomiting, diarrhea, fever or chills, or cough.  I reviewed the past medical records.  Per hospitalist discharge summary the patient was most recently admitted in December 2021 with SBO which was treated with conservative management.  I do not see any documented history of CHF.   Physical Exam   Triage Vital Signs: ED Triage Vitals [02/23/22 0711]  Enc Vitals Group     BP 138/88     Pulse Rate 65     Resp 17     Temp 98 F (36.7 C)     Temp Source Oral     SpO2 100 %     Weight      Height      Head Circumference      Peak Flow      Pain Score      Pain Loc      Pain Edu?      Excl. in [place]?     Most recent vital signs: Vitals:   02/23/22 0711 02/23/22 0915  BP: 138/88 (!) 130/91  Pulse: 65 (!) 59  Resp: 17 14  Temp: 98 F (36.7 C)   SpO2: 100% 100%     General: Alert and oriented, well-appearing for age. CV:  Good peripheral perfusion.  Resp:  Normal effort.  Lungs CTA B. Abd:  Ventral hernia, soft and nontender.  No distention.  Other:  2+ bilateral lower extremity edema with no erythema, induration, or abnormal warmth.  Motor intact in all extremities.   ED Results / Procedures / Treatments   Labs (all labs  ordered are listed, but only abnormal results are displayed) Labs Reviewed  CBC WITH DIFFERENTIAL/PLATELET - Abnormal; Notable for the following components:      Result Value   RBC 3.84 (*)    Hemoglobin 10.8 (*)    HCT 33.4 (*)    Lymphs Abs 0.5 (*)    All other components within normal limits  COMPREHENSIVE METABOLIC PANEL - Abnormal; Notable for the following components:   Sodium 126 (*)    Chloride 96 (*)    CO2 21 (*)    Glucose, Bld 101 (*)    GFR, Estimated 59 (*)    All other components within normal limits  BRAIN NATRIURETIC PEPTIDE - Abnormal; Notable for the following components:   B Natriuretic Peptide 1,122.7 (*)    All other components within normal limits  OSMOLALITY - Abnormal; Notable for the following components:   Osmolality 262 (*)    All other components within normal limits  MAGNESIUM  PHOSPHORUS  URINALYSIS, ROUTINE W REFLEX MICROSCOPIC  SODIUM, URINE, RANDOM  OSMOLALITY, URINE  BASIC METABOLIC PANEL  BASIC METABOLIC PANEL  BASIC METABOLIC PANEL  TSH  TROPONIN I (HIGH SENSITIVITY)  TROPONIN  I (HIGH SENSITIVITY)     EKG  ED ECG REPORT I, Arta Silence, the attending physician, personally viewed and interpreted this ECG.  Date: 02/23/2022 EKG Time: 0710 Rate: 63 Rhythm: Atrial fibrillation QRS Axis: normal Intervals: Nonspecific IVCD ST/T Wave abnormalities: LVH with repolarization abnormality; nonspecific ST maladies Narrative Interpretation: Nonspecific abnormalities with no evidence of acute ischemia; initial machine read indicates acute MI but there is no significant change when compared to EKG of 04/23/2020    RADIOLOGY  Chest x-ray: I independently viewed and interpreted the images; there are bilateral interstitial opacities consistent with CHF  PROCEDURES:  Critical Care performed: No  Procedures   MEDICATIONS ORDERED IN ED: Medications  ondansetron (ZOFRAN) injection 4 mg (has no administration in time range)   acetaminophen (TYLENOL) tablet 650 mg (has no administration in time range)  hydrALAZINE (APRESOLINE) injection 5 mg (has no administration in time range)  albuterol (PROVENTIL) (2.5 MG/3ML) 0.083% nebulizer solution 2.5 mL (has no administration in time range)  furosemide (LASIX) injection 40 mg (40 mg Intravenous Given 02/23/22 0914)     IMPRESSION / MDM / ASSESSMENT AND PLAN / ED COURSE  I reviewed the triage vital signs and the nursing notes.  88 year old male with PMH as noted above presents with edema, generalized weakness, several falls, and labs from his facility show a sodium of 121.  Physical exam reveals bilateral lower extremity edema but no anasarca.  He is alert and oriented with a nonfocal neurologic exam.  Overall the patient appears fluid overloaded rather than dehydrated.  Differential diagnosis includes, but is not limited to, new onset CHF, fluid overload due to kidney disease or other etiology as this overall appears to be hypervolemic hyponatremia.  We will obtain chest x-ray, lab work-up, and reassess.  I anticipate the patient will need diuresis and admission.   Patient's presentation is most consistent with acute presentation with potential threat to life or bodily function.  The patient is on the cardiac monitor to evaluate for evidence of arrhythmia and/or significant heart rate changes.  ----------------------------------------- 10:31 AM on 02/23/2022 -----------------------------------------  Sodium is slightly improved from outside labs at 126.  BNP is significantly elevated.  Osmolality is somewhat low.  Troponin is negative.  I have ordered IV Lasix.  I consulted Dr. Blaine Hamper from the hospitalist service; based on our discussion he agrees to admit the patient.   FINAL CLINICAL IMPRESSION(S) / ED DIAGNOSES   Final diagnoses:  None     Rx / DC Orders   ED Discharge Orders     None        Note:  This document was prepared using Dragon voice  recognition software and may include unintentional dictation errors.    Arta Silence, MD 02/23/22 1031

## 2022-02-23 NOTE — H&P (Signed)
History and Physical    Frank Bartlett:[id number] DOB: Jul 08, 1925 DOA: 02/23/2022  Referring MD/NP/PA:   PCP: Dewayne Shorter, MD   Patient coming from:  The patient is coming from ALF  Chief Complaint: leg edema and abnormal lab, fall.  HPI: Frank Bartlett is a 88 y.o. male with medical history significant of hypertension, hyperlipidemia, hypothyroidism, SSS, s/p of pacemaker placement, renal cell cancer (s/p of left nephrectomy), prostate cancer (s/p of radiation seeds implants), CAD-3A, bradycardia, atrial fibrillation on Eliquis, AAA, obesity with BMI 27.76, who presents with leg edema and abnormal labs, fall.    Patient states that he has worsening bilateral leg edema recently.  He has generalized weakness, lightheadedness.  Denies unilateral numbness or tinglings in extremities.  He fell several times, possibly injured his head at one time.  No loss of consciousness.  Patient has mild shortness breath, denies cough, chest pain.  No fever or chills.  Patient states that he does not have nausea, vomiting or abdominal pain.  He states that after he had procedure for radiation seeds implanted for prostate cancer, he has intermittent difficulty controlling his bowel movement due to anal sphincter disturbance.  He also reports hemorrhoid, intermittently bleeding from hemorrhoid. Per report, pt had outpt lab which showed hyponatremia with a sodium 121.  His sodium is 126 in ED today.   Data reviewed independently and ED Course: pt was found to have WBC 4.8, troponin level 14, 14, BNP 1122, stable renal function, potassium 4.1, magnesium 2.0, phosphorus 3.4, sodium of 126.  Temperature normal, blood pressure 130/91, heart rate 59, RR 17, oxygen saturation 100% on room air.  Chest x-ray showed pulmonary edema.  Patient is admitted to telemetry bed as inpatient.   EKG: I have personally reviewed.  Paced rhythm, QTc 511.   Review of Systems:   General: no fevers, chills, no body  weight gain, has fatigue HEENT: no blurry vision, hearing changes or sore throat Respiratory: has mild dyspnea, no coughing, wheezing CV: no chest pain, no palpitations GI: no nausea, vomiting, abdominal pain, diarrhea, constipation GU: no dysuria, burning on urination, increased urinary frequency, hematuria. Ext: has leg edema Neuro: no unilateral weakness, numbness, or tingling, no vision change or hearing loss. Has fall.  Skin: no rash, no skin tear. MSK: No muscle spasm, no deformity, no limitation of range of movement in spin Heme: No easy bruising.  Travel history: No recent long distant travel.   Allergy: No Known Allergies  Past Medical History:  Diagnosis Date   AAA (abdominal aortic aneurysm) (HCC)    Actinic keratosis 07/22/2017   left lateral crown, midline crown, right of midline crown   Aortic atherosclerosis (HCC)    Atrial fibrillation ([place]) 03/2016   brief spell   B12 deficiency    Basal cell carcinoma 10/30/2016   L lat crown   Bradycardia    Bradycardia 02/2017   Pacer placed   Chronic kidney disease (CKD), stage III (moderate) (HCC)    Hyperlipidemia    Hypertension    Hypothyroidism    Osteoarthritis    Prostate cancer ([place]) 2001   Rad tx's + seed implants   Renal cancer, left ([place]) 03/2016   Left Renal Nephrectomy   Sick sinus syndrome ([place])    Pacemaker    Past Surgical History:  Procedure Laterality Date   CATARACT EXTRACTION, BILATERAL     COLONOSCOPY     EXCISIONAL HEMORRHOIDECTOMY     INSERTION PROSTATE RADIATION SEED     LAPAROSCOPIC  NEPHRECTOMY, HAND ASSISTED Left 03/08/2016   Procedure: HAND ASSISTED LAPAROSCOPIC NEPHRECTOMY;  Surgeon: Nickie Retort, MD;  Location: [facility] ORS;  Service: Urology;  Laterality: Left;   LAPAROTOMY N/A 03/13/2016   Procedure: EXPLORATORY LAPAROTOMY;  Surgeon: Festus Aloe, MD;  Location: [facility] ORS;  Service: Urology;  Laterality: N/A;   PACEMAKER INSERTION N/A 02/20/2017   Procedure: INSERTION  PACEMAKER;  Surgeon: Isaias Cowman, MD;  Location: [facility] ORS;  Service: Cardiovascular;  Laterality: N/A;    Social History:  reports that he has never smoked. He has never used smokeless tobacco. He reports that he does not drink alcohol and does not use drugs.  Family History:  Family History  Problem Relation Age of Onset   Hypertension Mother    Breast cancer Mother    Colon cancer Father    Prostate cancer Neg Hx    Bladder Cancer Neg Hx    Kidney cancer Neg Hx      Prior to Admission medications   Medication Sig Start Date End Date Taking? Authorizing Provider  acetaminophen (TYLENOL) 325 MG tablet Take 650 mg by mouth every 4 (four) hours as needed.    [provider]  acetaminophen (TYLENOL) 500 MG tablet Take 1,000 mg by mouth daily. And '1000mg'$  twice a day prn    [provider]  amLODipine (NORVASC) 5 MG tablet Take 5 mg by mouth daily.    [provider]  Bismuth Subsalicylate (KAOPECTATE PO) Take 10 mLs by mouth as needed. For diarrhea    [provider]  carbamide peroxide (DEBROX) 6.5 % OTIC solution Place 5 drops into both ears as needed.    [provider]  cetirizine (ZYRTEC) 10 MG tablet Take 10 mg by mouth as needed for allergies.    [provider]  Cyanocobalamin 1000 MCG TBCR Take 1,000 mcg by mouth daily.     [provider]  dextromethorphan-guaiFENesin (ROBITUSSIN-DM) 10-100 MG/5ML liquid Take 10 mLs by mouth every 4 (four) hours as needed for cough.    [provider]  diphenhydrAMINE HCl (BENADRYL ALLERGY PO) Take 1 tablet by mouth.    [provider]  ELIQUIS 2.5 MG TABS tablet Take 2.5 mg by mouth 2 (two) times daily. 11/04/20   [provider]  Glucose 15 GM/32ML GEL Take 1 packet by mouth as needed. For low blodd sugar    [provider]  levothyroxine (SYNTHROID, LEVOTHROID) 112 MCG tablet Take 112 mcg by mouth daily before breakfast.    [provider]  lisinopril (ZESTRIL) 10 MG tablet Take 10 mg by mouth daily.    [provider]  magnesium hydroxide (MILK OF MAGNESIA) 400 MG/5ML suspension Take 30 mLs by mouth daily as needed for mild constipation.    [provider]  Multiple Vitamin (MULTI-VITAMINS) TABS Take 1 tablet by mouth daily.    [provider]  mupirocin ointment (BACTROBAN) 2 % 1 Application daily. Apply to scalp daily    [provider]  nystatin (MYCOSTATIN/NYSTOP) powder Apply 1 Application topically as needed.    [provider]  ondansetron (ZOFRAN) 4 MG tablet Take 4 mg by mouth as needed for nausea or vomiting. Up to three times a day    [provider]  sertraline (ZOLOFT) 25 MG tablet Take 1 tablet (25 mg total) by mouth daily. 10/06/21   Jearld Fenton, NP  Zinc Oxide (DESITIN) 13 % CREA Apply topically. Apply to rectum topically as needed    [provider]    Physical Exam: Vitals:   02/23/22 0711 02/23/22 0915  BP: 138/88 (!) 130/91  Pulse: 65 (!) 59  Resp: 17 14  Temp: 98 F (36.7 C)   TempSrc: Oral   SpO2: 100% 100%   General: Not in acute distress HEENT:       Eyes: PERRL, EOMI, no scleral icterus.       ENT: No discharge from the ears and nose, no pharynx injection, no tonsillar enlargement.        Neck: positive JVD, no bruit, no mass felt. Heme: No neck lymph node enlargement. Cardiac: S1/S2, RRR, No murmurs, No gallops or rubs. Respiratory: No rales, wheezing, rhonchi or rubs. GI: Soft, nondistended, nontender, no rebound pain, no organomegaly, BS present. GU: No hematuria Ext: 2+ pitting leg edema bilaterally. 1+DP/PT pulse bilaterally. Musculoskeletal: No joint deformities, No joint redness or warmth, no limitation of ROM in spin. Skin: No rashes.  Neuro: Alert, oriented X3, cranial nerves II-XII grossly intact, moves all extremities normally.  Psych: Patient is not psychotic, no suicidal or hemocidal  ideation.  Labs on Admission: I have personally reviewed following labs and imaging studies  CBC: Recent Labs  Lab 02/23/22 0715  WBC 4.8  NEUTROABS 3.6  HGB 10.8*  HCT 33.4*  MCV 87.0  PLT 672   Basic Metabolic Panel: Recent Labs  Lab 02/23/22 0715  NA 126*  K 4.1  CL 96*  CO2 21*  GLUCOSE 101*  BUN 22  CREATININE 1.15  CALCIUM 8.9  MG 2.0  PHOS 3.4   GFR: Estimated Creatinine Clearance: 43.4 mL/min (by C-G formula based on SCr of 1.15 mg/dL). Liver Function Tests: Recent Labs  Lab 02/23/22 0715  AST 31  ALT 29  ALKPHOS 90  BILITOT 0.9  PROT 7.2  ALBUMIN 3.7   No results for input(s): "LIPASE", "AMYLASE" in the last 168 hours. No results for input(s): "AMMONIA" in the last 168 hours. Coagulation Profile: Recent Labs  Lab 02/23/22 0715  INR 1.2   Cardiac Enzymes: No results for input(s): "CKTOTAL", "CKMB", "CKMBINDEX", "TROPONINI" in the last 168 hours. BNP (last 3 results) No results for input(s): "PROBNP" in the last 8760 hours. HbA1C: No results for input(s): "HGBA1C" in the last 72 hours. CBG: No results for input(s): "GLUCAP" in the last 168 hours. Lipid Profile: No results for input(s): "CHOL", "HDL", "LDLCALC", "TRIG", "CHOLHDL", "LDLDIRECT" in the last 72 hours. Thyroid Function Tests: Recent Labs    02/23/22 0715  TSH 8.962*   Anemia Panel: No results for input(s): "VITAMINB12", "FOLATE", "FERRITIN", "TIBC", "IRON", "RETICCTPCT" in the last 72 hours. Urine analysis:    Component Value Date/Time   COLORURINE YELLOW (A) 04/28/2020 1118   APPEARANCEUR CLOUDY (A) 04/28/2020 1118   LABSPEC 1.021 04/28/2020 1118   PHURINE 7.0 04/28/2020 1118   GLUCOSEU NEGATIVE 04/28/2020 1118   HGBUR NEGATIVE 04/28/2020 1118   BILIRUBINUR NEGATIVE 04/28/2020 1118   KETONESUR NEGATIVE 04/28/2020 1118   PROTEINUR 30 (A) 04/28/2020 1118   NITRITE NEGATIVE 04/28/2020 1118   LEUKOCYTESUR NEGATIVE 04/28/2020 1118   Sepsis  Labs: '@LABRCNTIP'$ (procalcitonin:4,lacticidven:4) )No results found for this or any previous visit (from the past 240 hour(s)).   Radiological Exams on Admission: CT HEAD WO CONTRAST (5MM)  Result Date: 02/23/2022 CLINICAL DATA:  Head trauma, minor (Age >= 65y) EXAM: CT HEAD WITHOUT CONTRAST TECHNIQUE: Contiguous axial images were obtained from the base of the skull through the vertex without intravenous contrast. RADIATION DOSE REDUCTION: This exam was performed according to the  departmental dose-optimization program which includes automated exposure control, adjustment of the mA and/or kV according to patient size and/or use of iterative reconstruction technique. COMPARISON:  CT head August 05, 2017. FINDINGS: Brain: No evidence of acute infarction, hemorrhage, hydrocephalus, extra-axial collection or mass lesion/mass effect. Mild for age patchy white matter hypodensities, nonspecific but compatible with chronic ischemic disease. Vascular: No hyperdense vessel identified. Skull: No acute fracture. Sinuses/Orbits: Partial right sphenoid sinus opacification with scattered paranasal sinus mucosal thickening. No acute orbital findings. Other: No mastoid effusions. IMPRESSION: No evidence of acute intracranial abnormality. Electronically Signed   By: Margaretha Sheffield M.D.   On: 02/23/2022 11:39   DG Chest 2 View  Result Date: 02/23/2022 CLINICAL DATA:  88 year old male with history of edema. EXAM: CHEST - 2 VIEW COMPARISON:  Chest x-ray 04/25/2020. FINDINGS: There is cephalization of the pulmonary vasculature and slight indistinctness of the interstitial markings suggestive of mild pulmonary edema. Small bilateral pleural effusions. Bibasilar opacities which may reflect areas of atelectasis and/or consolidation. Mild cardiomegaly. Upper mediastinal contours are within normal limits allowing for mild patient rotation. Atherosclerotic calcifications are noted in the thoracic aorta. Left pacemaker with lead tips  projecting over the expected location of the right atrium and right ventricle. IMPRESSION: 1. The appearance of the chest suggests mild congestive heart failure. 2. Bibasilar opacities favored to reflect areas of subsegmental atelectasis, although areas of airspace consolidation are not excluded. 3. Aortic atherosclerosis. Electronically Signed   By: Vinnie Langton M.D.   On: 02/23/2022 07:43      Assessment/Plan Principal Problem:   Acute CHF [facility]) Active Problems:   Hyponatremia   Fall   Hypertension   HLD (hyperlipidemia)   Hypothyroidism   Paroxysmal atrial fibrillation (HCC)   Stage 3a chronic kidney disease (HCC)   Normocytic anemia   Hemorrhoid   Depression   Assessment and Plan:  Acute CHF [facility]): Patient does not have history of CHF.  No 2D echo on record.  Patient presents with leg edema, elevated BNP 1122, chest x-ray showed pulmonary edema, clinically consistent with acute CHF.  No oxygen desaturation.  Not sure which type of CHF.  Given long history of hypertension, patient may have acute diastolic CHF.  It is difficult situation since patient also has hyponatremia.  Patient was given 40 mg of Lasix in ED, the repeated BMP showed hyponatremia slightly worsened from 126-125.  Mental status normal.  -will admit to tele bed as inpatient -Lasix 20 mg bid by IV after giving 40 mg of IV in ED -2d echo -Daily weights -strict I/O's -Low salt diet -Fluid restriction -Obtain REDs Vest reading  Hyponatremia: initial Na 126 which decreased to 125 after giving 40 mg of mental status normal. - Will check urine sodium, urine osmolality, serum osmolality. - check TSH - Fluid restriction - f/u by BMP q6h - avoid over correction too fast due to risk of central pontine myelinolysis - start sodium chloride tablet 1 g twice daily -Hold lisinopril  Fall: CT head negative. -fall precaution -pt/ot  Hypertension -IV hydralazine as needed -Patient is on IV Lasix -Hold lisinopril  due to hyponatremia - amlodipine  HLD (hyperlipidemia): Patient is not taking medications currently -Follow-up with PCP  Hypothyroidism -Synthroid  Paroxysmal atrial fibrillation [facility]): Patient has bradycardia with heart rate 59 -Hold Eliquis due to hemorrhoid bleeding  Stage 3a chronic kidney disease ([place]): Renal function is better than recent baseline.  Her creatinine was 1.7 on 08/26/2020.  Today her creatinine is 1.15, BUN 22.  GFR 59 -Follow-up with BMI of  Normocytic anemia: Hemoglobin 10.8.  Hemoglobin was at 12.6 on 08/31/2020.  Patient reports intermittent hemorrhoidal bleeding -Follow-up with CBC every 8 hours -Hold Eliquis  Hemorrhoid: -TUCK pad  Depression -Continue home medications      DVT ppx: SCD  Code Status: DNR (pt has DNR document from facility)  Family Communication: I offered to call her family, but patient states that her son is in [place], and her daughter is in [place] currently, I will not be able to reach them.  Disposition Plan:  Anticipate discharge back to previous environment  Consults called:  none  Admission status and Level of care: Telemetry Cardiac:   as inpt       Dispo: The patient is from: ALF              Anticipated d/c is to: ALF              Anticipated d/c date is: 2 days              Patient currently is not medically stable to d/c.    Severity of Illness:  The appropriate patient status for this patient is INPATIENT. Inpatient status is judged to be reasonable and necessary in order to provide the required intensity of service to ensure the patient's safety. The patient's presenting symptoms, physical exam findings, and initial radiographic and laboratory data in the context of their chronic comorbidities is felt to place them at high risk for further clinical deterioration. Furthermore, it is not anticipated that the patient will be medically stable for discharge from the hospital within 2 midnights of admission.   *  I certify that at the point of admission it is my clinical judgment that the patient will require inpatient hospital care spanning beyond 2 midnights from the point of admission due to high intensity of service, high risk for further deterioration and high frequency of surveillance required.*       Date of Service 02/23/2022    Ivor Costa [facility]   If 7PM-7AM, please contact night-coverage [email or web address] 02/23/2022, 12:32 PM

## 2022-02-23 NOTE — ED Triage Notes (Signed)
Pt arrives via EMS today from [facility] Per EMS, Staff at the facility sts that pt labs are off and that he has had increased swelling in his legs with drainage. Per Pt, I get dizzy when I stand up if I am sitting to long but rather than that I feel fine. I also have a little more leg swelling I guess but the drainage is normal.

## 2022-02-24 ENCOUNTER — Inpatient Hospital Stay
Admit: 2022-02-24 | Discharge: 2022-02-24 | Disposition: A | Discharge status: Home / Self Care | Payer: Medicare Other | Attending: Internal Medicine | Admitting: Internal Medicine

## 2022-02-24 DIAGNOSIS — D649 Anemia, unspecified: Secondary | ICD-10-CM | POA: Diagnosis not present

## 2022-02-24 DIAGNOSIS — I509 Heart failure, unspecified: Secondary | ICD-10-CM | POA: Diagnosis not present

## 2022-02-24 DIAGNOSIS — E871 Hypo-osmolality and hyponatremia: Secondary | ICD-10-CM | POA: Diagnosis not present

## 2022-02-24 LAB — CBC WITH DIFFERENTIAL/PLATELET
Abs Immature Granulocytes: 0.03 10*3/uL (ref 0.00–0.07)
Basophils Absolute: 0 10*3/uL (ref 0.0–0.1)
Basophils Relative: 1 %
Eosinophils Absolute: 0.1 10*3/uL (ref 0.0–0.5)
Eosinophils Relative: 2 %
HCT: 31.4 % — ABNORMAL LOW (ref 39.0–52.0)
Hemoglobin: 10.3 g/dL — ABNORMAL LOW (ref 13.0–17.0)
Immature Granulocytes: 1 %
Lymphocytes Relative: 6 %
Lymphs Abs: 0.4 10*3/uL — ABNORMAL LOW (ref 0.7–4.0)
MCH: 28.4 pg (ref 26.0–34.0)
MCHC: 32.8 g/dL (ref 30.0–36.0)
MCV: 86.5 fL (ref 80.0–100.0)
Monocytes Absolute: 0.8 10*3/uL (ref 0.1–1.0)
Monocytes Relative: 12 %
Neutro Abs: 5 10*3/uL (ref 1.7–7.7)
Neutrophils Relative %: 78 %
Platelets: 250 10*3/uL (ref 150–400)
RBC: 3.63 MIL/uL — ABNORMAL LOW (ref 4.22–5.81)
RDW: 14.5 % (ref 11.5–15.5)
WBC: 6.3 10*3/uL (ref 4.0–10.5)
nRBC: 0 % (ref 0.0–0.2)

## 2022-02-24 LAB — CBC
HCT: 32.2 % — ABNORMAL LOW (ref 39.0–52.0)
Hemoglobin: 10.5 g/dL — ABNORMAL LOW (ref 13.0–17.0)
MCH: 28.3 pg (ref 26.0–34.0)
MCHC: 32.6 g/dL (ref 30.0–36.0)
MCV: 86.8 fL (ref 80.0–100.0)
Platelets: 258 10*3/uL (ref 150–400)
RBC: 3.71 MIL/uL — ABNORMAL LOW (ref 4.22–5.81)
RDW: 14.4 % (ref 11.5–15.5)
WBC: 5.5 10*3/uL (ref 4.0–10.5)
nRBC: 0 % (ref 0.0–0.2)

## 2022-02-24 LAB — SODIUM, URINE, RANDOM: Sodium, Ur: 90 mmol/L

## 2022-02-24 LAB — URINALYSIS, ROUTINE W REFLEX MICROSCOPIC
Bilirubin Urine: NEGATIVE
Glucose, UA: NEGATIVE mg/dL
Hgb urine dipstick: NEGATIVE
Ketones, ur: NEGATIVE mg/dL
Leukocytes,Ua: NEGATIVE
Nitrite: NEGATIVE
Protein, ur: NEGATIVE mg/dL
Specific Gravity, Urine: 1.005 (ref 1.005–1.030)
Squamous Epithelial / HPF: NONE SEEN (ref 0–5)
pH: 5 (ref 5.0–8.0)

## 2022-02-24 LAB — ECHOCARDIOGRAM COMPLETE
AR max vel: 3.22 cm2
AV Peak grad: 7.7 mmHg
Ao pk vel: 1.39 m/s
Area-P 1/2: 3.06 cm2
S' Lateral: 4.5 cm
Single Plane A4C EF: 32.4 %

## 2022-02-24 LAB — BASIC METABOLIC PANEL
Anion gap: 8 (ref 5–15)
BUN: 21 mg/dL (ref 8–23)
CO2: 23 mmol/L (ref 22–32)
Calcium: 8.5 mg/dL — ABNORMAL LOW (ref 8.9–10.3)
Chloride: 96 mmol/L — ABNORMAL LOW (ref 98–111)
Creatinine, Ser: 1.28 mg/dL — ABNORMAL HIGH (ref 0.61–1.24)
GFR, Estimated: 52 mL/min — ABNORMAL LOW (ref >60)
Glucose, Bld: 107 mg/dL — ABNORMAL HIGH (ref 70–99)
Potassium: 3.8 mmol/L (ref 3.5–5.1)
Sodium: 127 mmol/L — ABNORMAL LOW (ref 135–145)

## 2022-02-24 LAB — IRON AND TIBC
Iron: 30 ug/dL — ABNORMAL LOW (ref 45–182)
Saturation Ratios: 9 % — ABNORMAL LOW (ref 17.9–39.5)
TIBC: 343 ug/dL (ref 250–450)
UIBC: 313 ug/dL

## 2022-02-24 LAB — OSMOLALITY, URINE: Osmolality, Ur: 265 mOsm/kg — ABNORMAL LOW (ref 300–900)

## 2022-02-24 LAB — FERRITIN: Ferritin: 14 ng/mL — ABNORMAL LOW (ref 24–336)

## 2022-02-24 LAB — MAGNESIUM: Magnesium: 1.7 mg/dL (ref 1.7–2.4)

## 2022-02-24 MED ORDER — FUROSEMIDE 10 MG/ML IJ SOLN
40.0000 mg | Freq: Every day | INTRAMUSCULAR | Status: DC
  Administered 2022-02-24: 40 mg via INTRAVENOUS
  Filled 2022-02-24: qty 4

## 2022-02-24 NOTE — Progress Notes (Signed)
  Echocardiogram 2D Echocardiogram has been performed.  Frank Bartlett 02/24/2022, 3:16 PM

## 2022-02-24 NOTE — ED Notes (Addendum)
Updated charge nurse that patient successfully got out of bed. Attached to multiple cords. Requested for patient to have a sitter. Patient placed on bed alarm.

## 2022-02-24 NOTE — Hospital Course (Signed)
Frank Bartlett is a 88 y.o. male with medical history significant of hypertension, hyperlipidemia, hypothyroidism, SSS, s/p of pacemaker placement, renal cell cancer (s/p of left nephrectomy), prostate cancer (s/p of radiation seeds implants), CAD-3A, bradycardia, atrial fibrillation on Eliquis, AAA, obesity with BMI 27.76, who presents with leg edema and abnormal labs, fall.  Upon arriving the hospital, chest x-ray showed mild pulm edema, BNP elevated at 1122.  Patient was given IV Lasix for congestive heart failure, echocardiogram showed ejection fraction 30 to 91%, grade 2 diastolic dysfunction. 10/23.  Patient refused to take all his medications, request to transition to comfort care. 10/24.  Patient feels better today, changed his mind and want to continue treatment.  Restarted diuretics with torsemide.  10/25 and 10/26.  Patient breathing better.  Currently on room air.  Increased up to 1.49.  Will hold torsemide today and restart at lower dose 20 mg daily starting tomorrow.  Recommend checking a a BMP in 3 to 4 days.  Patient is a DNR.  Recommend palliative care following at facility.

## 2022-02-24 NOTE — Progress Notes (Signed)
  Progress Note   Patient: Frank Bartlett KJZ:[id number] DOB: 1925/06/07 DOA: 02/23/2022     1 DOS: the patient was seen and examined on 02/24/2022   Brief hospital course: Frank Bartlett is a 88 y.o. male with medical history significant of hypertension, hyperlipidemia, hypothyroidism, SSS, s/p of pacemaker placement, renal cell cancer (s/p of left nephrectomy), prostate cancer (s/p of radiation seeds implants), CAD-3A, bradycardia, atrial fibrillation on Eliquis, AAA, obesity with BMI 27.76, who presents with leg edema and abnormal labs, fall.  Upon arriving the hospital, chest x-ray showed mild pulm edema, BNP elevated at 1122.  Patient was given IV Lasix for congestive heart failure, echocardiogram is performed.  Assessment and Plan: Acute on chronic congestive heart failure.  Ejection fraction unknown at this point. Patient received IV Lasix, condition improved.  Lung has cleared up.  He has received 1 more dose of IV Lasix morning, I will discontinue afterwards.  Monitor renal function.  Hyponatremia Chronic kidney disease stage IIIa. Sodium tablets started, will continue for now.  Hyponatremia appears to be chronic, baseline 131.  General weakness. Frequent fall. PT/OT evaluation.  Patient currently living in assisted living facility.  Paroxysmal atrial fibrillation. Patient has mild hemorrhoidal bleeding, hold Eliquis for today.  We will restart at time of discharge.  Chronic anemia. Hemorrhoid with intermittent bleeding. Stable, no active bleeding.  Check iron B12 level.      Subjective:  Patient feels better with breathing, still significant weakness.  Physical Exam: Vitals:   02/24/22 0800 02/24/22 0830 02/24/22 0830 02/24/22 0944  BP: 117/68 129/75  (!) 151/127  Pulse: 71 68    Resp: 15 17    Temp:   98.2 F (36.8 C)   TempSrc:   Oral   SpO2: 99% 97%     General exam: Appears calm and comfortable  Respiratory system: Lungs essentially cleared.  Respiratory effort normal. Cardiovascular system: S1 & S2 heard, RRR. No JVD, murmurs, rubs, gallops or clicks. No pedal edema. Gastrointestinal system: Abdomen is nondistended, soft and nontender. No organomegaly or masses felt. Normal bowel sounds heard. Central nervous system: Alert and oriented x3. No focal neurological deficits. Extremities: Symmetric 5 x 5 power. Skin: No rashes, lesions or ulcers Psychiatry: Mood & affect appropriate.   Data Reviewed:  Lab results and x-ray reviewed.  Family Communication: sons are not reached.   Disposition: Status is: Inpatient Remains inpatient appropriate because: Severity of disease,  Planned Discharge Destination:  tbd    Time spent: 35 minutes  Author: Sharen Hones, MD 02/24/2022 10:35 AM  For on call review [email or web address].

## 2022-02-24 NOTE — Evaluation (Signed)
Occupational Therapy Evaluation Patient Details Name: Frank Bartlett MRN: [id number] DOB: January 20, 1926 Today's Date: 02/24/2022   History of Present Illness Pt admitted to [facility] on 02/23/22 from [facility] for c/o abnormal labs (hyponatremia), BLE edema with weeping, dizziness in standing, and several falls. Chest x-ray showed mild pulm edema, BNP elevated at 1122.  Patient was given IV Lasix for congestive heart failure. Significant PMH includes: hypertension, hyperlipidemia, hypothyroidism, SSS, s/p of pacemaker placement, renal cell cancer (s/p of left nephrectomy), prostate cancer (s/p of radiation seeds implants), CAD-3A, bradycardia, atrial fibrillation on Eliquis, AAA, obesity.   Clinical Impression   Frank Bartlett was seen for OT evaluation this date. Prior to hospital admission, pt was MOD I for ADL management. Pt reports living at [facility] but does not require assist for ADL management. Pt presents to acute OT demonstrating impaired ADL performance and functional mobility 2/2 impaired cognition, generalized weakness, and decreased activity tolerance (See OT problem list). Pt currently requires MOD A for functional mobility/transfers, SET UP assist for UB ADL management at bed level. Pt noted with variable cognition during session and has regularly been getting OOB without assistance per RN. RN notified once OT evaluation was complete and pt left with all needs met bilat bed rails raised. Pt would benefit from skilled OT services to address noted impairments and functional limitations (see below for any additional details) in order to maximize safety and independence while minimizing falls risk and caregiver burden. Upon hospital discharge, recommend STR to maximize pt safety and return to PLOF.        Recommendations for follow up therapy are one component of a multi-disciplinary discharge planning process, led by the attending physician.  Recommendations may be updated based on patient  status, additional functional criteria and insurance authorization.   Follow Up Recommendations  Skilled nursing-short term rehab (<3 hours/day)    Assistance Recommended at Discharge Frequent or constant Supervision/Assistance  Patient can return home with the following A lot of help with walking and/or transfers;A lot of help with bathing/dressing/bathroom;Assistance with cooking/housework;Assist for transportation    Functional Status Assessment  Patient has had a recent decline in their functional status and demonstrates the ability to make significant improvements in function in a reasonable and predictable amount of time.  Equipment Recommendations   (defer)    Recommendations for Other Services       Precautions / Restrictions Precautions Precautions: Fall Restrictions Weight Bearing Restrictions: No Other Position/Activity Restrictions: O2 >/= 92%      Mobility Bed Mobility Overal bed mobility: Needs Assistance Bed Mobility: Supine to Sit, Sit to Supine     Supine to sit: Mod assist, HOB elevated          Transfers       Sit to Stand: From elevated surface           General transfer comment: deferred for pt safety. Per PT note, pt required MOD A for bed/functional mobility.      Balance Overall balance assessment: Needs assistance                                         ADL either performed or assessed with clinical judgement   ADL Overall ADL's : Needs assistance/impaired  General ADL Comments: Pt functionally limited by impaired cognition, decreased safety awareness and decreased awareness of deficits. Requires intermittent cueing to perform UB ADL management while semi-supine in bed. Functional mobility deferred at RN request as pt has been attempting to get OOB after therapy.     Vision Baseline Vision/History: 1 Wears glasses Patient Visual Report: No change from  baseline       Perception     Praxis      Pertinent Vitals/Pain Pain Assessment Pain Assessment: No/denies pain     Hand Dominance Left   Extremity/Trunk Assessment Upper Extremity Assessment Upper Extremity Assessment: Overall WFL for tasks assessed (resting essential tremor noted.)   Lower Extremity Assessment Lower Extremity Assessment: Defer to PT evaluation;Overall [facility] for tasks assessed   Cervical / Trunk Assessment Cervical / Trunk Assessment: Kyphotic   Communication Communication Communication: HOH   Cognition Arousal/Alertness: Awake/alert Behavior During Therapy: WFL for tasks assessed/performed Overall Cognitive Status: Within Functional Limits for tasks assessed                                 General Comments: Generally oriented to self, place, and limited situation. Cognition seems to flucctuate during session as pt does think he needs to be in church. Per RN, pt has been intermittently confused and attempting to get OOB regularly without assist.     General Comments  +3 pitting edema in BLE with weeping; increased erythema and with multiple healing/scabbed wounds. TTP. Increased skin breakdown with overlapping toes bil.    Exercises Other Exercises Other Exercises: OT facilitated bed level UB ADL management, pt educated on role of OT in acute setting and falls prevention strategies for home and hospital. pt education limited by cognition.   Shoulder Instructions      Home Living Family/patient expects to be discharged to:: Assisted living                             Home Equipment: Rollator (4 wheels)          Prior Functioning/Environment Prior Level of Function : Independent/Modified Independent;Driving             Mobility Comments: Sleeps in Risk analyst at [facility]. Mod I for transfers and limited gait with rollator. ADLs Comments: Mod I for ADL's (stands to bathe, but has shower chair available) and  medication management. Reports increased difficulty with LB dressing due to edema/weeping of BLE. IADL's provided by facility.        OT Problem List: Decreased strength;Decreased range of motion;Decreased activity tolerance;Decreased safety awareness;Impaired balance (sitting and/or standing);Decreased knowledge of use of DME or AE;Decreased cognition      OT Treatment/Interventions: Self-care/ADL training;Therapeutic exercise;DME and/or AE instruction;Patient/family education;Balance training;Cognitive remediation/compensation;Therapeutic activities    OT Goals(Current goals can be found in the care plan section) Acute Rehab OT Goals Patient Stated Goal: To go home OT Goal Formulation: With patient Time For Goal Achievement: 03/10/22 Potential to Achieve Goals: Good  OT Frequency: Min 2X/week    Co-evaluation              AM-PAC OT "6 Clicks" Daily Activity     Outcome Measure Help from another person eating meals?: A Little Help from another person taking care of personal grooming?: A Little Help from another person toileting, which includes using toliet, bedpan, or urinal?: A Lot Help from another person bathing (including  washing, rinsing, drying)?: A Lot Help from another person to put on and taking off regular upper body clothing?: A Little Help from another person to put on and taking off regular lower body clothing?: A Lot 6 Click Score: 15   End of Session Equipment Utilized During Treatment: Gait belt;Rolling walker (2 wheels) Nurse Communication: Mobility status  Activity Tolerance: Patient tolerated treatment well Patient left: in bed;with call bell/phone within reach (On ED gurney bed. No alarm pad, RN placed in trendelenburg with bilat bed rails up.)  OT Visit Diagnosis: Other abnormalities of gait and mobility (R26.89);Muscle weakness (generalized) (M62.81);Other symptoms and signs involving cognitive function                Time: 1412-1430 OT Time  Calculation (min): 18 min Charges:  OT General Charges $OT Visit: 1 Visit OT Evaluation $OT Eval Moderate Complexity: 1 Mod  Shara Blazing, M.S., OTR/L 02/24/22, 3:26 PM

## 2022-02-24 NOTE — Evaluation (Signed)
Physical Therapy Evaluation Patient Details Name: DONNIE PANIK MRN: [id number] DOB: 12-Oct-1925 Today's Date: 02/24/2022  History of Present Illness  Pt admitted to [facility] on 02/23/22 from [facility] for c/o abnormal labs (hyponatremia), BLE edema with weeping, dizziness in standing, and several falls. Chest x-ray showed mild pulm edema, BNP elevated at 1122.  Patient was given IV Lasix for congestive heart failure. Significant PMH includes: hypertension, hyperlipidemia, hypothyroidism, SSS, s/p of pacemaker placement, renal cell cancer (s/p of left nephrectomy), prostate cancer (s/p of radiation seeds implants), CAD-3A, bradycardia, atrial fibrillation on Eliquis, AAA, obesity.   Clinical Impression  Pt is a 88 year old M admitted to hospital on 02/23/22 for acute CHF. At baseline, pt reports being mod I with ADL's, medication management, and limited ambulation with rollator; he notes increased difficulty with LB dressing due to weeping from BLE edema. He reports [facility] assists with IADL's.   Pt presents with functional weakness, 3+ pitting edema with weeping in BLE, decreased gross balance with posterior lean, decreased activity tolerance, increased pain levels, poor safety awareness, and intermittent confusion, resulting in impaired functional mobility from baseline. Due to deficits, pt required mod assist for bed mobility, mod assist for multiple STS transfers from elevated bed height, and min assist to take 3 lateral steps at bedside with RW. Increased cueing for safety required throughout session due to fatigue and poor safety awareness. Increased time/effort required during session for PT assist with hygiene after BM incontinent episode. Unable to assess orthostatics in standing, however, BP normal in supine/sitting (126/74 mmHg and 120/77 mmHg respectively) and pt asymptomatic.  Deficits limit the pt's ability to safely and independently perform ADL's, transfer, and ambulate. Pt  will benefit from acute skilled PT services to address deficits for return to baseline function. At this time, PT recommends SNF rehab at [place] to address functional deficits and improve safety mobility for return to baseline function.        Recommendations for follow up therapy are one component of a multi-disciplinary discharge planning process, led by the attending physician.  Recommendations may be updated based on patient status, additional functional criteria and insurance authorization.  Follow Up Recommendations Skilled nursing-short term rehab (<3 hours/day) Can patient physically be transported by private vehicle: Yes    Assistance Recommended at Discharge Frequent or constant Supervision/Assistance  Patient can return home with the following  A lot of help with walking and/or transfers;A lot of help with bathing/dressing/bathroom;Assistance with cooking/housework;Assist for transportation;Help with stairs or ramp for entrance;Direct supervision/assist for medications management    Equipment Recommendations  (defer to post acute)     Functional Status Assessment Patient has had a recent decline in their functional status and demonstrates the ability to make significant improvements in function in a reasonable and predictable amount of time.     Precautions / Restrictions Precautions Precautions: Fall Restrictions Other Position/Activity Restrictions: O2 >/= 92%      Mobility  Bed Mobility Overal bed mobility: Needs Assistance Bed Mobility: Supine to Sit, Sit to Supine     Supine to sit: Mod assist, HOB elevated Sit to supine: Mod assist   General bed mobility comments: for trunk and BLE facilitation for EOB mobility; increased time/effort with HOB elevated    Transfers Overall transfer level: Needs assistance Equipment used: Rolling walker (2 wheels) Transfers: Sit to/from Stand Sit to Stand: Mod assist, From elevated surface           General transfer  comment: for power to stand  x3 from EOB for pericare after BM incontinent episode. Verbal cues for BLE/hand placement and sequencing. Limited standing tolerance.    Ambulation/Gait Ambulation/Gait assistance: Min assist Gait Distance (Feet): 2 Feet (3 lateral steps towards HOB) Assistive device: Rolling walker (2 wheels)         General Gait Details: Min assist for balance/RW management to ambulate short distance at bedside. Demonstrates narrow BOS, decreased step length/foot clearance, impaired motor planning, downward gaze, and posterior lean that worsened with fatigue. Required increased assist/cueing for RW management and sequencing.     Balance Overall balance assessment: Needs assistance Sitting-balance support: Bilateral upper extremity supported, Feet supported Sitting balance-Leahy Scale: Poor Sitting balance - Comments: intermittent assist ranging from CGA-min assist for static seated balance at EOB with BUE support Postural control: Posterior lean Standing balance support: Bilateral upper extremity supported, During functional activity Standing balance-Leahy Scale: Poor Standing balance comment: required at least min assist for static/dynamic standing balance with RW; increased posterior lean with fatigue                             Pertinent Vitals/Pain Pain Assessment Pain Assessment: Faces Faces Pain Scale: Hurts even more Pain Location: WB/touch to BLE Pain Intervention(s): Monitored during session, Limited activity within patient's tolerance    Home Living Family/patient expects to be discharged to:: Assisted living                 Home Equipment: Rollator (4 wheels)      Prior Function               Mobility Comments: Sleeps in Risk analyst at [facility]. Mod I for transfers and limited gait with rollator. ADLs Comments: Mod I for ADL's (stands to bathe, but has shower chair available) and medication management. Reports increased  difficulty with LB dressing due to edema/weeping of BLE. IADL's provided by facility.        Extremity/Trunk Assessment   Upper Extremity Assessment Upper Extremity Assessment: Overall WFL for tasks assessed (Grossly 4/5; sensation intact. Resting/essential tremors present)    Lower Extremity Assessment Lower Extremity Assessment: Overall WFL for tasks assessed (Grossly 4+/5; diminished sensation L2-3 bil, otherwise grossly intact. Coordination intact. TTP along distal LE where edema is present)    Cervical / Trunk Assessment Cervical / Trunk Assessment: Kyphotic  Communication   Communication: HOH  Cognition Arousal/Alertness: Awake/alert Behavior During Therapy: WFL for tasks assessed/performed Overall Cognitive Status: Within Functional Limits for tasks assessed                                 General Comments: A&O x4 although requires intermittent redirection about place (intermittently thinks he's at [facility]).        General Comments General comments (skin integrity, edema, etc.): +3 pitting edema in BLE with weeping; increased erythema and with multiple healing/scabbed wounds. TTP. Increased skin breakdown with overlapping toes bil.    Exercises Other Exercises Other Exercises: Participates in bed mobility, multiple STS transfers, and minimal gait at bedside with RW. PT to assist with pericare after BM incontinence. Other Exercises: Pt educated re: PT role/POC, DC recommendations, calling for help.   Assessment/Plan    PT Assessment Patient needs continued PT services  PT Problem List Decreased strength;Decreased activity tolerance;Decreased balance;Decreased mobility;Decreased cognition;Decreased safety awareness;Impaired sensation;Pain;Decreased skin integrity;Obesity       PT Treatment Interventions DME instruction;Gait training;Functional  mobility training;Therapeutic activities;Therapeutic exercise;Balance training;Neuromuscular  re-education;Cognitive remediation    PT Goals (Current goals can be found in the Care Plan section)  Acute Rehab PT Goals Patient Stated Goal: "get balance better" PT Goal Formulation: With patient Time For Goal Achievement: 03/10/22 Potential to Achieve Goals: Good    Frequency Min 2X/week        AM-PAC PT "6 Clicks" Mobility  Outcome Measure Help needed turning from your back to your side while in a flat bed without using bedrails?: A Lot Help needed moving from lying on your back to sitting on the side of a flat bed without using bedrails?: A Lot Help needed moving to and from a bed to a chair (including a wheelchair)?: A Little Help needed standing up from a chair using your arms (e.g., wheelchair or bedside chair)?: A Lot Help needed to walk in hospital room?: A Little Help needed climbing 3-5 steps with a railing? : A Lot 6 Click Score: 14    End of Session Equipment Utilized During Treatment: Gait belt Activity Tolerance: Patient tolerated treatment well;Patient limited by fatigue Patient left: in bed;with call bell/phone within reach Nurse Communication: Mobility status PT Visit Diagnosis: Unsteadiness on feet (R26.81);Muscle weakness (generalized) (M62.81);Difficulty in walking, not elsewhere classified (R26.2);Pain Pain - Right/Left:  (bil) Pain - part of body: Leg    Time: 1235-1311 PT Time Calculation (min) (ACUTE ONLY): 36 min   Charges:   PT Evaluation $PT Eval Low Complexity: 1 Low PT Treatments $Therapeutic Activity: 8-22 mins        Herminio Commons, PT, DPT 3:03 PM,02/24/22 Physical Therapist - [facility]

## 2022-02-25 DIAGNOSIS — I5043 Acute on chronic combined systolic (congestive) and diastolic (congestive) heart failure: Secondary | ICD-10-CM | POA: Diagnosis not present

## 2022-02-25 DIAGNOSIS — N1831 Chronic kidney disease, stage 3a: Secondary | ICD-10-CM | POA: Diagnosis not present

## 2022-02-25 DIAGNOSIS — I480 Paroxysmal atrial fibrillation: Secondary | ICD-10-CM | POA: Diagnosis not present

## 2022-02-25 LAB — MRSA NEXT GEN BY PCR, NASAL: MRSA by PCR Next Gen: NOT DETECTED

## 2022-02-25 LAB — BASIC METABOLIC PANEL
Anion gap: 7 (ref 5–15)
BUN: 20 mg/dL (ref 8–23)
CO2: 23 mmol/L (ref 22–32)
Calcium: 8.2 mg/dL — ABNORMAL LOW (ref 8.9–10.3)
Chloride: 99 mmol/L (ref 98–111)
Creatinine, Ser: 1.2 mg/dL (ref 0.61–1.24)
GFR, Estimated: 56 mL/min — ABNORMAL LOW (ref >60)
Glucose, Bld: 109 mg/dL — ABNORMAL HIGH (ref 70–99)
Potassium: 3.5 mmol/L (ref 3.5–5.1)
Sodium: 129 mmol/L — ABNORMAL LOW (ref 135–145)

## 2022-02-25 LAB — VITAMIN B12: Vitamin B-12: 4191 pg/mL — ABNORMAL HIGH (ref 180–914)

## 2022-02-25 LAB — CBC
HCT: 27.7 % — ABNORMAL LOW (ref 39.0–52.0)
Hemoglobin: 9.4 g/dL — ABNORMAL LOW (ref 13.0–17.0)
MCH: 28.6 pg (ref 26.0–34.0)
MCHC: 33.9 g/dL (ref 30.0–36.0)
MCV: 84.2 fL (ref 80.0–100.0)
Platelets: 237 10*3/uL (ref 150–400)
RBC: 3.29 MIL/uL — ABNORMAL LOW (ref 4.22–5.81)
RDW: 14 % (ref 11.5–15.5)
WBC: 4.8 10*3/uL (ref 4.0–10.5)
nRBC: 0 % (ref 0.0–0.2)

## 2022-02-25 LAB — MAGNESIUM: Magnesium: 1.9 mg/dL (ref 1.7–2.4)

## 2022-02-25 LAB — BRAIN NATRIURETIC PEPTIDE: B Natriuretic Peptide: 1301.2 pg/mL — ABNORMAL HIGH (ref 0.0–100.0)

## 2022-02-25 MED ORDER — FUROSEMIDE 10 MG/ML IJ SOLN
40.0000 mg | Freq: Two times a day (BID) | INTRAMUSCULAR | Status: DC
  Administered 2022-02-25 – 2022-02-26 (×2): 40 mg via INTRAVENOUS
  Filled 2022-02-25 (×2): qty 4

## 2022-02-25 MED ORDER — SODIUM CHLORIDE 0.9 % IV SOLN
300.0000 mg | Freq: Once | INTRAVENOUS | Status: AC
  Administered 2022-02-25: 300 mg via INTRAVENOUS
  Filled 2022-02-25: qty 300

## 2022-02-25 MED ORDER — SODIUM CHLORIDE 1 G PO TABS
1.0000 g | ORAL_TABLET | Freq: Every day | ORAL | Status: DC
  Administered 2022-02-26: 1 g via ORAL

## 2022-02-25 MED ORDER — CARVEDILOL 3.125 MG PO TABS
3.1250 mg | ORAL_TABLET | Freq: Two times a day (BID) | ORAL | Status: DC
  Administered 2022-02-25 – 2022-02-26 (×2): 3.125 mg via ORAL
  Filled 2022-02-25 (×2): qty 1

## 2022-02-25 MED ORDER — FUROSEMIDE 10 MG/ML IJ SOLN
40.0000 mg | Freq: Every day | INTRAMUSCULAR | Status: DC
  Administered 2022-02-25: 40 mg via INTRAVENOUS
  Filled 2022-02-25: qty 4

## 2022-02-25 MED ORDER — LEVOTHYROXINE SODIUM 50 MCG PO TABS
150.0000 ug | ORAL_TABLET | Freq: Every day | ORAL | Status: DC
  Administered 2022-02-26: 150 ug via ORAL
  Filled 2022-02-25: qty 1

## 2022-02-25 NOTE — NC FL2 (Signed)
[place] LEVEL OF CARE SCREENING TOOL     IDENTIFICATION  Patient Name: Frank Bartlett Birthdate: 1926/03/17 Sex: male Admission Date (Current Location): 02/23/2022  [facility] and [place] Number:  Engineering geologist and Address:  [facility], [address], [place], [place] [address]      Provider Number: [id number]  Attending Physician Name and Address:  Sharen Hones, MD  Relative Name and Phone Number:  Frank Bartlett [phone]    Current Level of Care: Hospital Recommended Level of Care: [place] Prior Approval Number:    Date Approved/Denied:   PASRR Number:    Discharge Plan: SNF    Current Diagnoses: Patient Active Problem List   Diagnosis Date Noted   Acute on chronic combined systolic and diastolic CHF (congestive heart failure) ([place]) 02/25/2022   Acute CHF ([place]) 02/23/2022   Fall 02/23/2022   Depression 02/23/2022   Normocytic anemia 02/23/2022   Hemorrhoid 02/23/2022   Benign enlargement of prostate 07/27/2021   Fecal incontinence 02/02/2021   History of prostate cancer 05/16/2020   Mood disorder ([place]) 08/06/2019   HLD (hyperlipidemia) 04/24/2019   Sick sinus syndrome ([place])    AAA (abdominal aortic aneurysm) ([place])    Aortic atherosclerosis ([place])    Hypertension    Hypothyroidism    Osteoarthritis of right knee    Stage 3a chronic kidney disease ([place]) 06/01/2016   Paroxysmal atrial fibrillation ([place]) 05/16/2016   History of renal cell cancer 03/26/2016   Hyponatremia 06/29/2015    Orientation RESPIRATION BLADDER Height & Weight     Self  Normal External catheter Weight: 79.4 kg Height:  '5\' 9"'$  (175.3 cm)  BEHAVIORAL SYMPTOMS/MOOD NEUROLOGICAL BOWEL NUTRITION STATUS      Incontinent    AMBULATORY STATUS COMMUNICATION OF NEEDS Skin   Limited Assist Verbally Other (Comment) (BLE w/ drsg due to 3+ edema)                       Personal Care Assistance Level of Assistance   Bathing, Feeding, Dressing Bathing Assistance: Limited assistance Feeding assistance: Limited assistance Dressing Assistance: Limited assistance     Functional Limitations [employer]  PT (By licensed PT), OT (By licensed OT)     PT Frequency: 5 X weekly OT Frequency: 5 X weekly            Contractures      Additional Factors Info  Code Status (DNR) Code Status Info: DNR             Current Medications (02/25/2022):  This is the current hospital active medication list Current Facility-Administered Medications  Medication Dose Route Frequency Provider Last Rate Last Admin   acetaminophen (TYLENOL) tablet 650 mg  650 mg Oral Q6H PRN Ivor Costa, MD   650 mg at 02/25/22 0152   albuterol (PROVENTIL) (2.5 MG/3ML) 0.083% nebulizer solution 2.5 mL  2.5 mL Inhalation Q4H PRN Ivor Costa, MD       amLODipine (NORVASC) tablet 5 mg  5 mg Oral Daily Ivor Costa, MD   5 mg at 02/25/22 1245   bismuth subsalicylate (PEPTO BISMOL) chewable tablet 262 mg  1 tablet Oral PRN Ivor Costa, MD       carvedilol (COREG) tablet 3.125 mg  3.125 mg Oral BID WC Sharen Hones, MD       cyanocobalamin (VITAMIN B12) tablet 1,000 mcg  1,000  mcg Oral Daily Ivor Costa, MD   1,000 mcg at 02/25/22 6015   furosemide (LASIX) injection 40 mg  40 mg Intravenous BID Sharen Hones, MD       guaiFENesin (ROBITUSSIN) 100 MG/5ML liquid 10 mL  10 mL Oral Q4H PRN Ivor Costa, MD       hydrALAZINE (APRESOLINE) injection 5 mg  5 mg Intravenous Q2H PRN Ivor Costa, MD       Derrill Memo ON 02/26/2022] levothyroxine (SYNTHROID) tablet 150 mcg  150 mcg Oral Q0600 Sharen Hones, MD       loratadine (CLARITIN) tablet 10 mg  10 mg Oral Daily PRN Ivor Costa, MD       multivitamin with minerals tablet 1 tablet  1 tablet Oral Daily Ivor Costa, MD   1 tablet at 02/25/22 6153   mupirocin ointment (BACTROBAN) 2 % 1 Application  1 Application Topical Daily Ivor Costa, MD   1 Application at 79/43/27 1024    ondansetron (ZOFRAN) injection 4 mg  4 mg Intravenous Q8H PRN Ivor Costa, MD       sertraline (ZOLOFT) tablet 25 mg  25 mg Oral Daily Ivor Costa, MD   25 mg at 02/25/22 0921   [START ON 02/26/2022] sodium chloride tablet 1 g  1 g Oral Q0600 Sharen Hones, MD       witch hazel-glycerin (TUCKS) pad   Topical PRN Ivor Costa, MD         Discharge Medications: Please see discharge summary for a list of discharge medications.  Relevant Imaging Results:  Relevant Lab Results:   Additional Information SS# [id number]  Harriet Masson, RN

## 2022-02-25 NOTE — Plan of Care (Signed)
  Problem: Education: Goal: Knowledge of General Education information will improve Description: Including pain rating scale, medication(s)/side effects and non-pharmacologic comfort measures Outcome: Progressing   Problem: Clinical Measurements: Goal: Respiratory complications will improve Outcome: Progressing   Problem: Activity: Goal: Risk for activity intolerance will decrease Outcome: Progressing   Problem: Education: Goal: Knowledge of General Education information will improve Description: Including pain rating scale, medication(s)/side effects and non-pharmacologic comfort measures Outcome: Progressing

## 2022-02-25 NOTE — Progress Notes (Signed)
  Progress Note   Patient: Frank Bartlett:[id number] DOB: 1926-02-12 DOA: 02/23/2022     2 DOS: the patient was seen and examined on 02/25/2022   Brief hospital course: Frank Bartlett is a 88 y.o. male with medical history significant of hypertension, hyperlipidemia, hypothyroidism, SSS, s/p of pacemaker placement, renal cell cancer (s/p of left nephrectomy), prostate cancer (s/p of radiation seeds implants), CAD-3A, bradycardia, atrial fibrillation on Eliquis, AAA, obesity with BMI 27.76, who presents with leg edema and abnormal labs, fall.  Upon arriving the hospital, chest x-ray showed mild pulm edema, BNP elevated at 1122.  Patient was given IV Lasix for congestive heart failure, echocardiogram showed ejection fraction 30 to 32%, grade 2 diastolic dysfunction.  Assessment and Plan: Acute on chronic combined systolic and diastolic congestive heart failure. Patient received 40 mg IV Lasix yesterday, he feels better.  However, BNP still significantly elevated.  Renal function is better, I will increase IV Lasix to twice a day.   Hyponatremia secondary to SIADH. Chronic kidney disease stage IIIa. Sodium level gradually improving, due to continued IV Lasix, I will decrease salt tablets to 1 g daily.  Continue fluid restriction.   General weakness. Frequent fall. Patient has been evaluated by PT/OT, recommended nursing home placement.   Paroxysmal atrial fibrillation. Patient has mild hemorrhoidal bleeding, hold Eliquis.  We will restart at time of discharge.   Chronic anemia. Hemorrhoid with intermittent bleeding. Iron deficient anemia. Significant iron deficiency, B12 pending.  We will give IV iron.  Hypothyroidism. TSH 8.9, increase Synthroid dose.     Subjective:  Patient feels better with breathing, no cough.  Physical Exam: Vitals:   02/25/22 0020 02/25/22 0451 02/25/22 0604 02/25/22 0826  BP: 105/82 106/69  130/80  Pulse: 67 60  (!) 59  Resp: (!) '24 18  16   '$ Temp: 98.2 F (36.8 C) 98.7 F (37.1 C)  98.3 F (36.8 C)  TempSrc: Oral Axillary  Oral  SpO2: 95% 91%  94%  Weight:   79.4 kg   Height:       General exam: Appears calm and frail Respiratory system: Decreased breath sounds. Respiratory effort normal. Cardiovascular system: Irregular.  No JVD, murmurs, rubs, gallops or clicks. No pedal edema. Gastrointestinal system: Abdomen is nondistended, soft and nontender. No organomegaly or masses felt. Normal bowel sounds heard. Central nervous system: Alert and oriented. No focal neurological deficits. Extremities: Symmetric 5 x 5 power. Skin: No rashes, lesions or ulcers Psychiatry: Mood & affect appropriate.   Data Reviewed:  Echocardiogram the lab results reviewed.  Family Communication: Called all 3 children's, could not get through.  Disposition: Status is: Inpatient Remains inpatient appropriate because: Severity of disease, IV treatment.  Planned Discharge Destination: Skilled nursing facility    Time spent: 35 minutes  Author: Sharen Hones, MD 02/25/2022 12:05 PM  For on call review [email or web address].

## 2022-02-25 NOTE — Evaluation (Signed)
Clinical/Bedside Swallow Evaluation Patient Details  Name: Frank Bartlett MRN: [id number] Date of Birth: 10-20-1925  Today's Date: 02/25/2022 Time: SLP Start Time (ACUTE ONLY): 1500 SLP Stop Time (ACUTE ONLY): 1525 SLP Time Calculation (min) (ACUTE ONLY): 25 min  Past Medical History:  Past Medical History:  Diagnosis Date   AAA (abdominal aortic aneurysm) ([place])    Actinic keratosis 07/22/2017   left lateral crown, midline crown, right of midline crown   Aortic atherosclerosis ([facility])    Atrial fibrillation ([place]) 03/2016   brief spell   B12 deficiency    Basal cell carcinoma 10/30/2016   L lat crown   Bradycardia    Bradycardia 02/2017   Pacer placed   Chronic kidney disease (CKD), stage III (moderate) ([facility])    Hyperlipidemia    Hypertension    Hypothyroidism    Osteoarthritis    Prostate cancer ([place]) 2001   Rad tx's + seed implants   Renal cancer, left ([place]) 03/2016   Left Renal Nephrectomy   Sick sinus syndrome ([place])    Pacemaker   Past Surgical History:  Past Surgical History:  Procedure Laterality Date   CATARACT EXTRACTION, BILATERAL     COLONOSCOPY     EXCISIONAL HEMORRHOIDECTOMY     INSERTION PROSTATE RADIATION SEED     LAPAROSCOPIC NEPHRECTOMY, HAND ASSISTED Left 03/08/2016   Procedure: HAND ASSISTED LAPAROSCOPIC NEPHRECTOMY;  Surgeon: Nickie Retort, MD;  Location: [facility] ORS;  Service: Urology;  Laterality: Left;   LAPAROTOMY N/A 03/13/2016   Procedure: EXPLORATORY LAPAROTOMY;  Surgeon: Festus Aloe, MD;  Location: [facility] ORS;  Service: Urology;  Laterality: N/A;   PACEMAKER INSERTION N/A 02/20/2017   Procedure: INSERTION PACEMAKER;  Surgeon: Isaias Cowman, MD;  Location: [facility] ORS;  Service: Cardiovascular;  Laterality: N/A;   HPI:  Per H&P: "Frank Bartlett is a 88 y.o. male with medical history significant of hypertension, hyperlipidemia, hypothyroidism, SSS, s/p of pacemaker placement, renal cell cancer (s/p of left nephrectomy), prostate  cancer (s/p of radiation seeds implants), CAD-3A, bradycardia, atrial fibrillation on Eliquis, AAA, obesity with BMI 27.76, who presents with leg edema and abnormal labs, fall" Pt is currently on room air and regular solids and thin liquids. Head CT 02/23/22: No evidence of acute intracranial abnormality. CXR 02/23/22: 1. The appearance of the chest suggests mild congestive heart failure. Bibasilar opacities favored to reflect areas of subsegmental atelectasis, although areas of airspace consolidation are not excluded. Aortic atherosclerosis." RN reporting some coughing with eating breakfast and coughing with medications.    Assessment / Plan / Recommendation  Clinical Impression  Pt with known history of mild oropharyngeal dysphagia, as indicated by MBSS completed in December 2021 during prior hospitalization. Results of assessment included, "Patient presents with mild pharyngeal dysphagia, with trace aspiration occurring with thin liquids after the swallow. Suspect normative age-related changes in swallowing are exacerbated by acute illness/decreased ability to compensate." At the time pt was recommended for nectar thick liquids and mech soft solids. Pt reports consuming regular solids and thin liquids in the interim between hospitalizations. Pt further reported that he coughs occasionally after liquid, but not consistently.       For today's assessment pt seen with trials of thin liquids, nectar thick liquids, puree, and regular solids. Pt with cough following serial sips of thin liquid x2. Not repeated with trials of single sips of thin liquids. No other s/sx of aspiration with trials of nectar thick liquids or solids. Oral phase grossly intact for mastication and transfer for puree and  regular consistencies.       Education shared with pt regarding rationale for aspiration precautions (with emphasis on slow rate and single sips) in the setting of age and likely changes to swallow coordination. Pt  reported understanding. Given grossly stable respiratory status and minimal s/sx of aspiration, recommend continued regular solids and thin liquids with aspiration precautions (slow rate, small bites, elevated HOB, and alert for PO intake). If pt were to have acute change to respiratory status or increased concern for aspiration PNA, trialing nectar thick liquids could be warranted. Recommend medications whole in puree. MD and RN aware and in agreement with plan. SLP to follow up for further education for aspiration precautions and tolerance for diet.  SLP Visit Diagnosis: Dysphagia, pharyngeal phase (R13.13)    Aspiration Risk  Mild aspiration risk    Diet Recommendation   Regular solids and thin liquids   Medication Administration: Whole meds with puree    Other  Recommendations Oral Care Recommendations: Oral care BID    Recommendations for follow up therapy are one component of a multi-disciplinary discharge planning process, led by the attending physician.  Recommendations may be updated based on patient status, additional functional criteria and insurance authorization.  Follow up Recommendations No SLP follow up      Assistance Recommended at Discharge Intermittent Supervision/Assistance  Functional Status Assessment Patient has not had a recent decline in their functional status (suspect close to basline for swallow function)  Frequency and Duration min 1 x/week  1 week       Prognosis Prognosis for Safe Diet Advancement: Good      Swallow Study   General Date of Onset: 02/25/22 HPI: Per H&P: "Frank Bartlett is a 88 y.o. male with medical history significant of hypertension, hyperlipidemia, hypothyroidism, SSS, s/p of pacemaker placement, renal cell cancer (s/p of left nephrectomy), prostate cancer (s/p of radiation seeds implants), CAD-3A, bradycardia, atrial fibrillation on Eliquis, AAA, obesity with BMI 27.76, who presents with leg edema and abnormal labs, fall" Pt is  currently on room air and regular solids and thin liquids. Head CT 02/23/22: No evidence of acute intracranial abnormality. CXR 02/23/22: 1. The appearance of the chest suggests mild congestive heart failure. Bibasilar opacities favored to reflect areas of subsegmental atelectasis, although areas of airspace consolidation are not excluded. Aortic atherosclerosis." RN reporting some coughing with eating breakfast and coughing with medications. Type of Study: Bedside Swallow Evaluation Previous Swallow Assessment: MBSS in December 2021 Diet Prior to this Study: Regular;Thin liquids Temperature Spikes Noted: No (Temp 98.7; WBC 4.8) Respiratory Status: Room air History of Recent Intubation: No Behavior/Cognition: Alert;Cooperative;Pleasant mood Oral Cavity Assessment: Within Functional Limits Oral Care Completed by SLP: Recent completion by staff Oral Cavity - Dentition: Adequate natural dentition Vision: Functional for self-feeding Self-Feeding Abilities: Able to feed self Patient Positioning: Upright in bed Baseline Vocal Quality: Normal Volitional Cough: Strong Volitional Swallow: Able to elicit    Oral/Motor/Sensory Function Overall Oral Motor/Sensory Function: Within functional limits   Ice Chips Ice chips: Not tested   Thin Liquid Thin Liquid: Impaired Presentation: Straw Pharyngeal  Phase Impairments: Cough - Immediate;Cough - Delayed    Nectar Thick Nectar Thick Liquid: Within functional limits   Honey Thick Honey Thick Liquid: Not tested   Puree Puree: Within functional limits   Solid     Solid: Within functional limits     [place] Meryem Haertel Clapp MS CCC SLP   [place] J Clapp 02/25/2022,5:14 PM

## 2022-02-25 NOTE — TOC Progression Note (Signed)
Transition of Care [facility]) - Progression Note    Patient Details  Name: Frank Bartlett MRN: [id number] Date of Birth: [birth year]  [facility] CM/SW Contact  Zigmund Daniel Dorian Pod, RN Phone Number:[phone] 02/25/2022, 3:39 PM  Clinical Narrative:    RN spoke with [facility] Seth Bake) who indicated bed were available for pt from ALF if requested. RN attempted to confirm SNF level of care for [facility] with Son [name]) however only able to leave a vm requesting a call back. TOC will start with FL2 for this level of care.  TOC will follow up the son on confirming [facility] for SNF level of care for this pt.        Expected Discharge Plan and Services                                                 Social Determinants of Health (SDOH) Interventions    Readmission Risk Interventions     No data to display

## 2022-02-26 DIAGNOSIS — E871 Hypo-osmolality and hyponatremia: Secondary | ICD-10-CM | POA: Diagnosis not present

## 2022-02-26 DIAGNOSIS — I5043 Acute on chronic combined systolic (congestive) and diastolic (congestive) heart failure: Secondary | ICD-10-CM | POA: Diagnosis not present

## 2022-02-26 DIAGNOSIS — I480 Paroxysmal atrial fibrillation: Secondary | ICD-10-CM | POA: Diagnosis not present

## 2022-02-26 LAB — BASIC METABOLIC PANEL
Anion gap: 10 (ref 5–15)
BUN: 18 mg/dL (ref 8–23)
CO2: 24 mmol/L (ref 22–32)
Calcium: 8.3 mg/dL — ABNORMAL LOW (ref 8.9–10.3)
Chloride: 94 mmol/L — ABNORMAL LOW (ref 98–111)
Creatinine, Ser: 1.3 mg/dL — ABNORMAL HIGH (ref 0.61–1.24)
GFR, Estimated: 51 mL/min — ABNORMAL LOW (ref >60)
Glucose, Bld: 102 mg/dL — ABNORMAL HIGH (ref 70–99)
Potassium: 3.3 mmol/L — ABNORMAL LOW (ref 3.5–5.1)
Sodium: 128 mmol/L — ABNORMAL LOW (ref 135–145)

## 2022-02-26 LAB — CBC
HCT: 29.3 % — ABNORMAL LOW (ref 39.0–52.0)
Hemoglobin: 10 g/dL — ABNORMAL LOW (ref 13.0–17.0)
MCH: 29.1 pg (ref 26.0–34.0)
MCHC: 34.1 g/dL (ref 30.0–36.0)
MCV: 85.2 fL (ref 80.0–100.0)
Platelets: 234 10*3/uL (ref 150–400)
RBC: 3.44 MIL/uL — ABNORMAL LOW (ref 4.22–5.81)
RDW: 14.5 % (ref 11.5–15.5)
WBC: 4.9 10*3/uL (ref 4.0–10.5)
nRBC: 0 % (ref 0.0–0.2)

## 2022-02-26 LAB — MAGNESIUM: Magnesium: 1.8 mg/dL (ref 1.7–2.4)

## 2022-02-26 NOTE — Progress Notes (Signed)
SLP Cancellation Note  Patient Details Name: Frank Bartlett MRN: [id number] DOB: [birth year]   Cancelled treatment:       Reason Eval/Treat Not Completed: Other (comment) (speech therapy discontinued. Pt now on comfort measures.)   [place] Micayla Brathwaite Clapp MS [facility] SLP   [place] J Clapp 02/26/2022, 10:37 AM

## 2022-02-26 NOTE — Progress Notes (Addendum)
Per MD patient is requesting comfort care.   Patient alert and oriented x1.   Attempt to contact patient's son's and daughter. No answer at any phone numbers listed.   Spoke with patient's Chaplin from [facility]. Patient's son's and daughter are out of the country.   Spoke with Seth Bake at [facility] admissions. Patient previously living in ALF. Seth Bake stated prior to patient's admissions his goals were to return to facility. Patient would be able to return to SNF at [facility] with a option to begin hospice once he was there. Obtained an email for patient's son from [place]. [email or web address]. Email correspondence sent requesting call or return email regarding discharge plan. Seth Bake provided a internet phone number for family [phone]. Number attempted unsuccessfully. Left message requesting call back .   Per MD patient will return to [facility] 10/24.

## 2022-02-26 NOTE — Consult Note (Addendum)
   Heart Failure Nurse Navigator Note  HFrEF 30 to 35%.  Left ventricle is mildly dilated.  Grade 2 diastolic dysfunction.  Right ventricular systolic function is moderately reduced.  Moderate tricuspid regurgitation.  Moderate aortic insufficiency.  Was sent from assisted living due to having sustained a fall, leg edema and abnormal lab work.  BNP 1152.  Chest x-ray revealed mild cardiomegaly with heart failure.  Comorbidities:  Abdominal aortic aneurysm Atrial fibrillation Bradycardia Insertion of permanent pacemaker Hyperlipidemia Hypertension Hypothyroidism Osteoarthritis History of renal cancer with left nephrectomy  Medications: These were discontinued: amlodipine 5 mg daily, carvedilol 3.125 mg 2 times a day with meals Furosemide 40 mg IV twice daily Levothyroxine 1 micrograms daily   Labs:  Sodium 128, potassium 3.3, chloride 94, CO2 24, BUN 18, creatinine 1.3, estimated GFR 51, magnesium 1.8, WBC 4.9, hemoglobin 10, hematocrit 29 3, platelet count 234 Weight is 79.6 kg Intake 1115 mL Output 2575m Blood pressure is 118/77    Initial meeting with patient on this admission.  He is awake and alert in no acute distress.  He tells me that he was in  assisted living.   Asked what heart failure meant to him;He felt that it meant that his heart was going to stop.  Discussed heart failure, his function and what it means.  Went over the importance of daily weights, fluid restriction, removing the saltshaker from the table and eating low-sodium foods.  He states in the past he has been told he has a low sodium level and was encouraged to use salt.  He states before retirement he worked in [employer]  He states that the hospice nurse was in and talk to him.  He states "that I really am feeling better and I am not sure that that is the road I want go down at this time."( Patients nurse sent secure chat.)  He was given the living with heart failure teaching booklet,  zone magnet, info on low-sodium and heart failure along with weight chart.  He has an appointment in the outpatient heart failure clinic on October 31 at 11:30 AM.  He has a 4% no-show which is 3 out of 69 appointments.  JPricilla RiffleRN CHFN

## 2022-02-26 NOTE — Progress Notes (Addendum)
  Progress Note   Patient: Frank Bartlett UXL:[id number] DOB: 1925/09/09 DOA: 02/23/2022     3 DOS: the patient was seen and examined on 02/26/2022   Brief hospital course: Frank Bartlett is a 88 y.o. male with medical history significant of hypertension, hyperlipidemia, hypothyroidism, SSS, s/p of pacemaker placement, renal cell cancer (s/p of left nephrectomy), prostate cancer (s/p of radiation seeds implants), CAD-3A, bradycardia, atrial fibrillation on Eliquis, AAA, obesity with BMI 27.76, who presents with leg edema and abnormal labs, fall.  Upon arriving the hospital, chest x-ray showed mild pulm edema, BNP elevated at 1122.  Patient was given IV Lasix for congestive heart failure, echocardiogram showed ejection fraction 30 to 53%, grade 2 diastolic dysfunction. 10/23.  Patient has requested comfort care and stop all medications.  Assessment and Plan: Acute on chronic combined systolic and diastolic congestive heart failure. Patient condition is improving after diuretics.  Currently patient has requested comfort care.  Currently, patient is alert and oriented to time place and person.  He does not show signs of depression.  I am aware that the patient had occasional confusion, but currently he does not have any confusion.  He has capacity to make his own decision.  We will discontinue all active treatment.  Hyponatremia secondary to SIADH. Chronic kidney disease stage IIIa. Discontinue all treatment.   General weakness. Frequent fall. Discontinue physical therapy.   Paroxysmal atrial fibrillation. No need for anticoagulation.   Chronic anemia. Hemorrhoid with intermittent bleeding. Iron deficient anemia.    Hypothyroidism.        Subjective:  Patient feels much better, no shortness of breath. He has poor appetite  Physical Exam: Vitals:   02/25/22 2000 02/26/22 0600 02/26/22 0615 02/26/22 0726  BP: 96/65  119/74 118/77  Pulse: 60  60 (!) 58  Resp:    14   Temp: 97.9 F (36.6 C)  97.9 F (36.6 C) 98.1 F (36.7 C)  TempSrc: Oral  Oral Oral  SpO2: 95%  97% 91%  Weight:  79.6 kg    Height:       General exam: Appears calm and comfortable  Respiratory system: Clear to auscultation. Respiratory effort normal. Cardiovascular system: S1 & S2 heard, RRR. No JVD, murmurs, rubs, gallops or clicks. No pedal edema. Gastrointestinal system: Abdomen is nondistended, soft and nontender. No organomegaly or masses felt. Normal bowel sounds heard. Central nervous system: Alert and oriented x3. No focal neurological deficits. Extremities: Symmetric 5 x 5 power. Skin: No rashes, lesions or ulcers Psychiatry: Judgement and insight appear normal. Mood & affect appropriate.   Data Reviewed:  Lab results reviewed.  Family Communication: Not able to reach family.  Disposition: Status is: Inpatient Remains inpatient appropriate because: Comfort care.  Planned Discharge Destination:  TBD    Time spent: 35 minutes  Author: Sharen Hones, MD 02/26/2022 1:04 PM  For on call review [email or web address].

## 2022-02-26 NOTE — Progress Notes (Signed)
[facility] Liaison Note:  Referral received for hospice at home.  Met with Mr. Geller at the bedside.  He is alert but pleasantly confused.  Family has been unable to be reached due to being out of the country.  One son lives in [place] and sister is visiting.  The other son is in [place].    TOC updated, ACC will continue to follow for discharge disposition.    Thank you for allowing to participate in this patients care.    Please call with any questions or concerns.  Kenna Gilbert BSN, RN  [facility] Liaison  [phone]

## 2022-02-26 NOTE — Progress Notes (Signed)
Referral for hospice made to [facility] [place].

## 2022-02-26 NOTE — Care Management Important Message (Signed)
Important Message  Patient Details  Name: Frank Bartlett MRN: [id number] Date of Birth: May 29, 1925   Medicare Important Message Given:  Yes     Dannette Barbara 02/26/2022, 3:45 PM

## 2022-02-26 NOTE — Progress Notes (Signed)
Nutrition Brief Note  RD pulled to chart secondary to CHF Exacerbation.   Wt Readings from Last 15 Encounters:  02/26/22 79.6 kg  02/20/22 85.3 kg  10/27/21 80.5 kg  10/06/21 81.7 kg  07/27/21 81.4 kg  04/28/21 80.1 kg  02/02/21 80.7 kg  11/02/20 78.8 kg  07/28/20 80.4 kg  05/16/20 81.6 kg  05/05/20 82.8 kg  04/24/20 83.5 kg  02/25/20 84 kg  11/26/19 82.8 kg  08/27/19 84.7 kg   Pt with medical history significant of hypertension, hyperlipidemia, hypothyroidism, SSS, s/p of pacemaker placement, renal cell cancer (s/p of left nephrectomy), prostate cancer (s/p of radiation seeds implants), CAD-3A, bradycardia, atrial fibrillation on Eliquis, AAA, obesity with BMI 27.76, who presents with leg edema and abnormal labs, fall.   RD provided "Low Sodium Nutrition Therapy" handout from [facility] Nutrition Care Manual; attached to AVS/ discharge summary.   Current diet order is Heart Healthy with 1.5 L fluid restriction (liberalized to 2 gram sodium), patient is consuming approximately 100% of meals at this time. Labs and medications reviewed.   No nutrition interventions warranted at this time. If nutrition issues arise, please consult RD.   Loistine Chance, RD, LDN, [place] Registered Dietitian II Certified Diabetes Care and Education Specialist Please refer to [facility] for RD and/or RD on-call/weekend/after hours pager

## 2022-02-26 NOTE — Discharge Instructions (Signed)

## 2022-02-26 NOTE — Progress Notes (Signed)
PT Cancellation Note  Patient Details Name: Frank Bartlett MRN: [id number] DOB: 03-28-1926   Cancelled Treatment:    Reason Eval/Treat Not Completed: Other (comment). Discussed at bedside with patient and MD. Pt wishes to pursue comfort care. Will dc in house at this time.   Kiffany Schelling 02/26/2022, 10:31 AM Greggory Stallion, PT, DPT, GCS [phone]

## 2022-02-27 ENCOUNTER — Encounter: Payer: Self-pay | Admitting: Internal Medicine

## 2022-02-27 DIAGNOSIS — I5043 Acute on chronic combined systolic (congestive) and diastolic (congestive) heart failure: Secondary | ICD-10-CM | POA: Diagnosis not present

## 2022-02-27 DIAGNOSIS — I480 Paroxysmal atrial fibrillation: Secondary | ICD-10-CM | POA: Diagnosis not present

## 2022-02-27 MED ORDER — POTASSIUM CHLORIDE 10 MEQ/100ML IV SOLN: 10.0000 meq | INTRAVENOUS | Status: DC

## 2022-02-27 MED ORDER — LEVOTHYROXINE SODIUM 50 MCG PO TABS
150.0000 ug | ORAL_TABLET | Freq: Every day | ORAL | Status: DC
  Administered 2022-02-27 – 2022-03-01 (×3): 150 ug via ORAL
  Filled 2022-02-27 (×3): qty 1

## 2022-02-27 MED ORDER — APIXABAN 2.5 MG PO TABS
2.5000 mg | ORAL_TABLET | Freq: Two times a day (BID) | ORAL | Status: DC
  Administered 2022-02-27 – 2022-03-01 (×5): 2.5 mg via ORAL
  Filled 2022-02-27 (×4): qty 1

## 2022-02-27 MED ORDER — POTASSIUM CHLORIDE 10 MEQ/100ML IV SOLN
10.0000 meq | INTRAVENOUS | Status: AC
  Filled 2022-02-27 (×2): qty 100

## 2022-02-27 MED ORDER — TORSEMIDE 20 MG PO TABS
40.0000 mg | ORAL_TABLET | Freq: Two times a day (BID) | ORAL | Status: DC
  Administered 2022-02-27 – 2022-02-28 (×3): 40 mg via ORAL
  Filled 2022-02-27 (×3): qty 2

## 2022-02-27 MED ORDER — LISINOPRIL 5 MG PO TABS
5.0000 mg | ORAL_TABLET | Freq: Every day | ORAL | Status: DC
  Administered 2022-02-27 – 2022-03-01 (×3): 5 mg via ORAL
  Filled 2022-02-27 (×3): qty 1

## 2022-02-27 MED ORDER — VITAMIN B-12 1000 MCG PO TABS
1000.0000 ug | ORAL_TABLET | Freq: Every day | ORAL | Status: DC
  Administered 2022-02-27: 1000 ug via ORAL
  Filled 2022-02-27: qty 1

## 2022-02-27 NOTE — Progress Notes (Signed)
[facility] Liaison Note:  Referral received for hospice at home 10/23.  Today patient is wanting to continue w/ treatment, no longer wanting just comfort care.  Palliative medicine consulted to address goals of care with patient.  ACC will continue to follow if patient desires to have hospice services at discharge.    Thank you for allowing to participate in this patients care.    Please call with any questions or concerns.  Kenna Gilbert BSN, RN  [facility] Liaison  [phone]

## 2022-02-27 NOTE — Progress Notes (Signed)
Occupational Therapy Treatment Patient Details Name: Frank Bartlett MRN: [id number] DOB: 03/27/1926 Today's Date: 02/27/2022   History of present illness Pt admitted to [facility] on 02/23/22 from [facility] for c/o abnormal labs (hyponatremia), BLE edema with weeping, dizziness in standing, and several falls. Chest x-ray showed mild pulm edema, BNP elevated at 1122.  Patient was given IV Lasix for congestive heart failure. Significant PMH includes: hypertension, hyperlipidemia, hypothyroidism, SSS, s/p of pacemaker placement, renal cell cancer (s/p of left nephrectomy), prostate cancer (s/p of radiation seeds implants), CAD-3A, bradycardia, atrial fibrillation on Eliquis, AAA, obesity.   OT comments  Upon entering session, pt resting in bed and agreeable to OT. Pt was on comfort care, however, has now changed his mind and wants to continue working with OT/PT. OT to re-initiate therapy. Tx session targeted improving functional balance during ADL tasks in preparation for improved functional transfers and ADL task completion. Pt tolerated sitting EOB for ~15 min with close supervision while engaging in grooming tasks. Set up A provided. Pt required Max A for posterior hygiene in standing after incontinent BM episode. He was then able to take ~2 lateral steps with Min A using RW before returning to EOB. Pt left as received with all needs in reach. Pt is making progress toward goal completion. D/C recommendation remains appropriate. OT will continue to follow acutely.     Recommendations for follow up therapy are one component of a multi-disciplinary discharge planning process, led by the attending physician.  Recommendations may be updated based on patient status, additional functional criteria and insurance authorization.    Follow Up Recommendations  Skilled nursing-short term rehab (<3 hours/day)    Assistance Recommended at Discharge Frequent or constant Supervision/Assistance  Patient can return  home with the following  A lot of help with walking and/or transfers;A lot of help with bathing/dressing/bathroom;Assistance with cooking/housework;Assist for transportation   Equipment Recommendations  Other (comment) (defer to next venue of care)    Recommendations for Other Services      Precautions / Restrictions Precautions Precautions: Fall Restrictions Weight Bearing Restrictions: No Other Position/Activity Restrictions: O2 >/= 92%       Mobility Bed Mobility Overal bed mobility: Needs Assistance Bed Mobility: Supine to Sit, Sit to Supine     Supine to sit: Mod assist, HOB elevated Sit to supine: Max assist, HOB elevated   General bed mobility comments: Mod A to scoot hips forward at EOB, +2 assist to scoot pt up towards [facility]    Transfers Overall transfer level: Needs assistance Equipment used: Rolling walker (2 wheels) Transfers: Sit to/from Stand Sit to Stand: From elevated surface, Min assist, Mod assist                 Balance Overall balance assessment: Needs assistance Sitting-balance support: Bilateral upper extremity supported, Feet supported Sitting balance-Leahy Scale: Fair Sitting balance - Comments: close supervision, pt with difficulty keeping head up while sitting EOB requiring VC   Standing balance support: Bilateral upper extremity supported, During functional activity Standing balance-Leahy Scale: Fair Standing balance comment: pt able to maintain standing balance with BUE support from RW while OT assisting with posterior hygiene                           ADL either performed or assessed with clinical judgement   ADL Overall ADL's : Needs assistance/impaired     Grooming: Oral care;Wash/dry face;Sitting;Set up;Supervision/safety Grooming Details (indicate cue type and reason): assist  for placing toothpaste on toothbrush             Lower Body Dressing: Maximal assistance;Sitting/lateral leans Lower Body Dressing  Details (indicate cue type and reason): socks     Toileting- Clothing Manipulation and Hygiene: Sit to/from stand;Maximal assistance Toileting - Clothing Manipulation Details (indicate cue type and reason): Max A for posterior hygiene in standing after incontinent BM episode, BUE support from RW     Functional mobility during ADLs: Minimal assistance;Rolling walker (2 wheels) (to take ~2 steps toward [facility], assist for RW management) General ADL Comments: Pt instructed to take small sips of water during oral care, tolerated with no issues. Right before getting back to bed, pt with coughing spell and OT providing close monitoring. Pt endorsing, "this happens sometimes." Pt repositioned for safety with HOB upright at end of session. Pt's RN and charge nurse aware.    Extremity/Trunk Assessment Upper Extremity Assessment Upper Extremity Assessment: Overall WFL for tasks assessed   Lower Extremity Assessment Lower Extremity Assessment: Overall WFL for tasks assessed   Cervical / Trunk Assessment Cervical / Trunk Assessment: Kyphotic    Vision Baseline Vision/History: 1 Wears glasses Patient Visual Report: No change from baseline     Perception     Praxis      Cognition Arousal/Alertness: Awake/alert Behavior During Therapy: WFL for tasks assessed/performed Overall Cognitive Status: Within Functional Limits for tasks assessed                                 General Comments: Oriented to self, place, situation; disoriented to time (reading date off of whiteboard in room). Able to follow all commands        Exercises      Shoulder Instructions       General Comments      Pertinent Vitals/ Pain       Pain Assessment Pain Assessment: No/denies pain  Home Living                                          Prior Functioning/Environment              Frequency  Min 2X/week        Progress Toward Goals  OT Goals(current goals can now  be found in the care plan section)  Progress towards OT goals: Progressing toward goals  Acute Rehab OT Goals Patient Stated Goal: to go home OT Goal Formulation: With patient Time For Goal Achievement: 03/10/22 Potential to Achieve Goals: [place] Discharge plan remains appropriate;Frequency remains appropriate    Co-evaluation                 AM-PAC OT "6 Clicks" Daily Activity     Outcome Measure   Help from another person eating meals?: A Little Help from another person taking care of personal grooming?: A Little Help from another person toileting, which includes using toliet, bedpan, or urinal?: A Lot Help from another person bathing (including washing, rinsing, drying)?: A Lot Help from another person to put on and taking off regular upper body clothing?: A Little Help from another person to put on and taking off regular lower body clothing?: A Lot 6 Click Score: 15    End of Session Equipment Utilized During Treatment: Gait belt;Rolling walker (2 wheels)  OT Visit Diagnosis: Other  abnormalities of gait and mobility (R26.89);Muscle weakness (generalized) (M62.81);Other symptoms and signs involving cognitive function   Activity Tolerance Patient tolerated treatment well;Patient limited by fatigue   Patient Left in bed;with call bell/phone within reach;with bed alarm set   Nurse Communication Mobility status        Time: 9622-2979 OT Time Calculation (min): 39 min  Charges: OT General Charges $OT Visit: 1 Visit OT Treatments $Self Care/Home Management : 38-52 mins  [facility] MS, OTR/L ascom [phone]  02/27/22, 6:09 PM

## 2022-02-27 NOTE — Progress Notes (Signed)
Progress Note   Patient: Frank Bartlett ZWC:[id number] DOB: Dec 08, 1925 DOA: 02/23/2022     4 DOS: the patient was seen and examined on 02/27/2022   Brief hospital course: Frank Bartlett is a 88 y.o. male with medical history significant of hypertension, hyperlipidemia, hypothyroidism, SSS, s/p of pacemaker placement, renal cell cancer (s/p of left nephrectomy), prostate cancer (s/p of radiation seeds implants), CAD-3A, bradycardia, atrial fibrillation on Eliquis, AAA, obesity with BMI 27.76, who presents with leg edema and abnormal labs, fall.  Upon arriving the hospital, chest x-ray showed mild pulm edema, BNP elevated at 1122.  Patient was given IV Lasix for congestive heart failure, echocardiogram showed ejection fraction 30 to 23%, grade 2 diastolic dysfunction. 10/23.  Patient refused to take all his medications, request to transition to comfort care. 10/24.  Patient feels better today, changed his mind and want to continue treatment.  Restarted diuretics with torsemide.  Assessment and Plan: Acute on chronic combined systolic and diastolic congestive heart failure. Patient volume status is better, will start torsemide, continue monitor renal function and electrolytes.  Renal function started worse yesterday.   Hyponatremia secondary to SIADH. Chronic kidney disease stage IIIa. Hypokalemia. Continue monitor sodium closely, if sodium level continue to drop, may consider add salt tablets. Renal function stable. Replete potassium.    General weakness. Frequent fall. PT/OT.   Paroxysmal atrial fibrillation. Restart anticoagulation, rate under control.   Chronic anemia. Hemorrhoid with intermittent bleeding. Iron deficient anemia. Received IV iron, continue monitor hemoglobin.  No additional bleeding.   Hypothyroidism. Elevated TSH, Synthroid dose increased.  Goal of care discussion Contacted the family over the last few days, could not go through. Patient refused all  treatments yesterday, requested transition to comfort care. However, after additional discussion today, patient changed his mind.  He states that he feels better today, he feels that still has some hope of getting better.  He request to put back on treatment. But I still believe that he is alive his pregnancy is less than 6 months, he still can going to nursing home with hospice care after condition improves.      Subjective:  Patient feels better physically, now he is hopeful that he can actually getting better. Short of breath essentially resolved.  No cough.  Physical Exam: Vitals:   02/26/22 2102 02/27/22 0522 02/27/22 0802 02/27/22 0840  BP: 103/66  112/66 124/73  Pulse: (!) 59  63 97  Resp: '20  16 14  '$ Temp: 98.1 F (36.7 C)  99.6 F (37.6 C) 97.9 F (36.6 C)  TempSrc:   Oral Oral  SpO2: 92%  95% 100%  Weight:  74.6 kg    Height:  '5\' 9"'$  (1.753 m)     General exam: Appears calm and comfortable  Respiratory system: Decreased breathing sounds, no crackles. Respiratory effort normal. Cardiovascular system: S1 & S2 heard, RRR. No JVD, murmurs, rubs, gallops or clicks. No pedal edema. Gastrointestinal system: Abdomen is nondistended, soft and nontender. No organomegaly or masses felt. Normal bowel sounds heard. Central nervous system: Alert and oriented x3. No focal neurological deficits. Extremities: Symmetric 5 x 5 power. Skin: No rashes, lesions or ulcers Psychiatry: Mood & affect appropriate.   Data Reviewed:  Lab results reviewed.  Family Communication: Not able to reach family, left message on the phone. one son is coming to hospital in the next 2 days per patient  Disposition: Status is: Inpatient Remains inpatient appropriate because: Severity of disease, active treatment for congestive heart failure.  Planned Discharge Destination: Skilled nursing facility    Time spent: 55 minutes  Author: Sharen Hones, MD 02/27/2022 10:27 AM  For on call review  [email or web address].

## 2022-02-27 NOTE — Plan of Care (Signed)
  Problem: Education: Goal: Ability to demonstrate management of disease process will improve Outcome: Progressing Goal: Ability to verbalize understanding of medication therapies will improve Outcome: Progressing Goal: Individualized Educational Video(s) Outcome: Progressing   Problem: Activity: Goal: Capacity to carry out activities will improve Outcome: Progressing   Problem: Cardiac: Goal: Ability to achieve and maintain adequate cardiopulmonary perfusion will improve Outcome: Progressing   Problem: Education: Goal: Knowledge of General Education information will improve Description: Including pain rating scale, medication(s)/side effects and non-pharmacologic comfort measures Outcome: Progressing   Problem: Health Behavior/Discharge Planning: Goal: Ability to manage health-related needs will improve Outcome: Progressing   Problem: Clinical Measurements: Goal: Ability to maintain clinical measurements within normal limits will improve Outcome: Progressing Goal: Will remain free from infection Outcome: Progressing Goal: Diagnostic test results will improve Outcome: Progressing Goal: Respiratory complications will improve Outcome: Progressing Goal: Cardiovascular complication will be avoided Outcome: Progressing   Problem: Activity: Goal: Risk for activity intolerance will decrease Outcome: Progressing   Problem: Nutrition: Goal: Adequate nutrition will be maintained Outcome: Progressing   Problem: Coping: Goal: Level of anxiety will decrease Outcome: Progressing   Problem: Elimination: Goal: Will not experience complications related to bowel motility Outcome: Progressing Goal: Will not experience complications related to urinary retention Outcome: Progressing   Problem: Pain Managment: Goal: General experience of comfort will improve Outcome: Progressing   Problem: Safety: Goal: Ability to remain free from injury will improve Outcome: Progressing    Problem: Skin Integrity: Goal: Risk for impaired skin integrity will decrease Outcome: Progressing   Problem: Education: Goal: Knowledge of General Education information will improve Description: Including pain rating scale, medication(s)/side effects and non-pharmacologic comfort measures Outcome: Progressing   Problem: Health Behavior/Discharge Planning: Goal: Ability to manage health-related needs will improve Outcome: Progressing   Problem: Clinical Measurements: Goal: Ability to maintain clinical measurements within normal limits will improve Outcome: Progressing Goal: Will remain free from infection Outcome: Progressing Goal: Diagnostic test results will improve Outcome: Progressing Goal: Respiratory complications will improve Outcome: Progressing Goal: Cardiovascular complication will be avoided Outcome: Progressing   Problem: Activity: Goal: Risk for activity intolerance will decrease Outcome: Progressing   Problem: Nutrition: Goal: Adequate nutrition will be maintained Outcome: Progressing   Problem: Coping: Goal: Level of anxiety will decrease Outcome: Progressing   Problem: Elimination: Goal: Will not experience complications related to bowel motility Outcome: Progressing Goal: Will not experience complications related to urinary retention Outcome: Progressing   Problem: Pain Managment: Goal: General experience of comfort will improve Outcome: Progressing   Problem: Safety: Goal: Ability to remain free from injury will improve Outcome: Progressing   Problem: Skin Integrity: Goal: Risk for impaired skin integrity will decrease Outcome: Progressing

## 2022-02-27 NOTE — Progress Notes (Signed)
Speech Language Pathology Treatment: Dysphagia  Patient Details Name: Frank Bartlett MRN: [id number] DOB: Dec 09, 1925 Today's Date: 02/27/2022 Time: 1135-1220 SLP Time Calculation (min) (ACUTE ONLY): 45 min  Assessment / Plan / Recommendation Clinical Impression  Patient seen for follow-up for dysphagia; MD reconsulted as pt expresses desire to discontinue comfort measures. No change in swallow function reported; RN reports pt taking medications without difficulty. Pt endorsed coughing on sip of liquid prior to SLP entering room; he was partially reclined. SLP repositioned pt and reinforced aspiration precautions. Pt recalled that he was to take single sips of liquid; initially he did so but at an accelerated rate, with minimal time for recovery. This resulted in delayed cough, throat clearing initially. With small sips and brief rest between sips, pt consumed further 6 oz thin liquids, 4 oz puree and 2 graham crackers with no overt s/sx aspiration. Patient able to implement aspiration precautions (slow rate, small sips, rest between bites/sips, intermittent throat clear) without cues once instructed. As respiratory status remains stable, recommend pt continue current diet, discussed with RN aspiration risk due to history of dysphagia and reinforced importance of upright positioning for pt when consuming POs. No further ST indicated at this time, SLP to s/o.      HPI HPI: Per H&P: "Frank Bartlett is a 88 y.o. male with medical history significant of hypertension, hyperlipidemia, hypothyroidism, SSS, s/p of pacemaker placement, renal cell cancer (s/p of left nephrectomy), prostate cancer (s/p of radiation seeds implants), CAD-3A, bradycardia, atrial fibrillation on Eliquis, AAA, obesity with BMI 27.76, who presents with leg edema and abnormal labs, fall" Pt is currently on room air and regular solids and thin liquids. Head CT 02/23/22: No evidence of acute intracranial abnormality. CXR 02/23/22:  1. The appearance of the chest suggests mild congestive heart failure. Bibasilar opacities favored to reflect areas of subsegmental atelectasis, although areas of airspace consolidation are not excluded. Aortic atherosclerosis." RN reporting some coughing with eating breakfast and coughing with medications.      SLP Plan  Discharge SLP treatment due to (comment) (education completed)      Recommendations for follow up therapy are one component of a multi-disciplinary discharge planning process, led by the attending physician.  Recommendations may be updated based on patient status, additional functional criteria and insurance authorization.    Recommendations  Diet recommendations: Regular;Thin liquid Liquids provided via: Cup;Straw Medication Administration: Whole meds with puree Supervision: Patient able to self feed (Please assist with upright positioning prior to meals) Compensations: Slow rate;Small sips/bites;Clear throat intermittently Postural Changes and/or Swallow Maneuvers: Seated upright 90 degrees                Oral Care Recommendations: Oral care BID Follow Up Recommendations: No SLP follow up Assistance recommended at discharge:  (defer to PT/OT recs) SLP Visit Diagnosis: Dysphagia, pharyngeal phase (R13.13) Plan: Discharge SLP treatment due to (comment) (education completed)        Deneise Lever, [place], CCC-SLP Speech-Language Pathologist [phone]'   Aliene Altes  02/27/2022, 2:14 PM

## 2022-02-27 NOTE — Plan of Care (Signed)
Patient remains in PCU. Active orders remain as "comfort measures only'. No obvious, acute distress.    Problem: Education: Goal: Ability to demonstrate management of disease process will improve Outcome: Not Progressing Goal: Ability to verbalize understanding of medication therapies will improve Outcome: Not Progressing Goal: Individualized Educational Video(s) Outcome: Not Progressing   Problem: Activity: Goal: Capacity to carry out activities will improve Outcome: Not Progressing   Problem: Cardiac: Goal: Ability to achieve and maintain adequate cardiopulmonary perfusion will improve Outcome: Not Progressing   Problem: Education: Goal: Knowledge of General Education information will improve Description: Including pain rating scale, medication(s)/side effects and non-pharmacologic comfort measures Outcome: Not Progressing   Problem: Health Behavior/Discharge Planning: Goal: Ability to manage health-related needs will improve Outcome: Not Progressing   Problem: Clinical Measurements: Goal: Ability to maintain clinical measurements within normal limits will improve Outcome: Not Progressing Goal: Will remain free from infection Outcome: Not Progressing Goal: Diagnostic test results will improve Outcome: Not Progressing Goal: Respiratory complications will improve Outcome: Not Progressing Goal: Cardiovascular complication will be avoided Outcome: Not Progressing   Problem: Activity: Goal: Risk for activity intolerance will decrease Outcome: Not Progressing   Problem: Nutrition: Goal: Adequate nutrition will be maintained Outcome: Not Progressing   Problem: Coping: Goal: Level of anxiety will decrease Outcome: Not Progressing   Problem: Elimination: Goal: Will not experience complications related to bowel motility Outcome: Not Progressing Goal: Will not experience complications related to urinary retention Outcome: Not Progressing   Problem: Pain  Managment: Goal: General experience of comfort will improve Outcome: Not Progressing   Problem: Safety: Goal: Ability to remain free from injury will improve Outcome: Not Progressing   Problem: Skin Integrity: Goal: Risk for impaired skin integrity will decrease Outcome: Not Progressing   Problem: Education: Goal: Knowledge of General Education information will improve Description: Including pain rating scale, medication(s)/side effects and non-pharmacologic comfort measures Outcome: Not Progressing   Problem: Health Behavior/Discharge Planning: Goal: Ability to manage health-related needs will improve Outcome: Not Progressing   Problem: Clinical Measurements: Goal: Ability to maintain clinical measurements within normal limits will improve Outcome: Not Progressing Goal: Will remain free from infection Outcome: Not Progressing Goal: Diagnostic test results will improve Outcome: Not Progressing Goal: Respiratory complications will improve Outcome: Not Progressing Goal: Cardiovascular complication will be avoided Outcome: Not Progressing   Problem: Activity: Goal: Risk for activity intolerance will decrease Outcome: Not Progressing   Problem: Nutrition: Goal: Adequate nutrition will be maintained Outcome: Not Progressing   Problem: Coping: Goal: Level of anxiety will decrease Outcome: Not Progressing   Problem: Elimination: Goal: Will not experience complications related to bowel motility Outcome: Not Progressing Goal: Will not experience complications related to urinary retention Outcome: Not Progressing   Problem: Pain Managment: Goal: General experience of comfort will improve Outcome: Not Progressing   Problem: Safety: Goal: Ability to remain free from injury will improve Outcome: Not Progressing   Problem: Skin Integrity: Goal: Risk for impaired skin integrity will decrease Outcome: Not Progressing

## 2022-02-28 DIAGNOSIS — E039 Hypothyroidism, unspecified: Secondary | ICD-10-CM | POA: Diagnosis not present

## 2022-02-28 DIAGNOSIS — I5023 Acute on chronic systolic (congestive) heart failure: Secondary | ICD-10-CM | POA: Diagnosis not present

## 2022-02-28 DIAGNOSIS — I10 Essential (primary) hypertension: Secondary | ICD-10-CM | POA: Diagnosis not present

## 2022-02-28 DIAGNOSIS — Z7189 Other specified counseling: Secondary | ICD-10-CM

## 2022-02-28 DIAGNOSIS — E871 Hypo-osmolality and hyponatremia: Secondary | ICD-10-CM | POA: Diagnosis not present

## 2022-02-28 LAB — BASIC METABOLIC PANEL
Anion gap: 10 (ref 5–15)
BUN: 28 mg/dL — ABNORMAL HIGH (ref 8–23)
CO2: 26 mmol/L (ref 22–32)
Calcium: 8 mg/dL — ABNORMAL LOW (ref 8.9–10.3)
Chloride: 91 mmol/L — ABNORMAL LOW (ref 98–111)
Creatinine, Ser: 1.37 mg/dL — ABNORMAL HIGH (ref 0.61–1.24)
GFR, Estimated: 48 mL/min — ABNORMAL LOW (ref >60)
Glucose, Bld: 100 mg/dL — ABNORMAL HIGH (ref 70–99)
Potassium: 3.7 mmol/L (ref 3.5–5.1)
Sodium: 127 mmol/L — ABNORMAL LOW (ref 135–145)

## 2022-02-28 LAB — CBC
HCT: 28.9 % — ABNORMAL LOW (ref 39.0–52.0)
Hemoglobin: 9.7 g/dL — ABNORMAL LOW (ref 13.0–17.0)
MCH: 28.7 pg (ref 26.0–34.0)
MCHC: 33.6 g/dL (ref 30.0–36.0)
MCV: 85.5 fL (ref 80.0–100.0)
Platelets: 226 10*3/uL (ref 150–400)
RBC: 3.38 MIL/uL — ABNORMAL LOW (ref 4.22–5.81)
RDW: 14.6 % (ref 11.5–15.5)
WBC: 5.8 10*3/uL (ref 4.0–10.5)
nRBC: 0 % (ref 0.0–0.2)

## 2022-02-28 LAB — MAGNESIUM: Magnesium: 1.8 mg/dL (ref 1.7–2.4)

## 2022-02-28 MED ORDER — FERROUS SULFATE 325 (65 FE) MG PO TABS
325.0000 mg | ORAL_TABLET | Freq: Every day | ORAL | Status: DC
  Administered 2022-02-28 – 2022-03-01 (×2): 325 mg via ORAL
  Filled 2022-02-28: qty 1

## 2022-02-28 MED ORDER — DAPAGLIFLOZIN PROPANEDIOL 5 MG PO TABS
5.0000 mg | ORAL_TABLET | Freq: Every day | ORAL | Status: DC
  Administered 2022-03-01: 5 mg via ORAL
  Filled 2022-02-28: qty 1

## 2022-02-28 MED ORDER — ORAL CARE MOUTH RINSE: 15.0000 mL | OROMUCOSAL | Status: DC | PRN

## 2022-02-28 NOTE — Evaluation (Signed)
Physical Therapy Re-Evaluation Patient Details Name: Frank Bartlett MRN: [id number] DOB: [birth year] Today's Date: 02/28/2022  History of Present Illness  Pt was admitted to [facility] on 02/23/22 from [facility] for c/o abnormal labs (hyponatremia), BLE edema with weeping, dizziness in standing, and several falls. Chest x-ray showed mild pulm edema, BNP elevated at 1122.  Patient was given IV Lasix for congestive heart failure. Significant PMH includes: hypertension, hyperlipidemia, hypothyroidism, SSS, s/p of pacemaker placement, renal cell cancer (s/p of left nephrectomy), prostate cancer (s/p of radiation seeds implants), CAD-3A, bradycardia, atrial fibrillation on Eliquis, AAA, obesity.  Clinical Impression  Pt had been dropped from PT due to going comfort care, he has now rescinded this plan and is re-evaluated by PT this date.  He was able to get up and do some walking, though he he far from his baseline and needed considerable assist for bed mobility, attaining and standing and ultimately was slow and labored with modest ambulation effort (reports he walks easily to the dining area TID w/o issue).  Pt is weaker/more limited than his baseline and will benefit from STR to address safety and mobility deficits.        Recommendations for follow up therapy are one component of a multi-disciplinary discharge planning process, led by the attending physician.  Recommendations may be updated based on patient status, additional functional criteria and insurance authorization.  Follow Up Recommendations Skilled nursing-short term rehab (<3 hours/day) Can patient physically be transported by private vehicle: Yes    Assistance Recommended at Discharge Frequent or constant Supervision/Assistance  Patient can return home with the following  Assistance with cooking/housework;Assist for transportation;Help with stairs or ramp for entrance;Direct supervision/assist for medications management;A little help with  walking and/or transfers;A little help with bathing/dressing/bathroom    Equipment Recommendations Rolling walker (2 wheels)  Recommendations for Other Services       Functional Status Assessment Patient has had a recent decline in their functional status and demonstrates the ability to make significant improvements in function in a reasonable and predictable amount of time.     Precautions / Restrictions Precautions Precautions: Fall Restrictions Weight Bearing Restrictions: No      Mobility  Bed Mobility Overal bed mobility: Needs Assistance Bed Mobility: Supine to Sit     Supine to sit: Min assist, Mod assist     General bed mobility comments: Pt showed good effort with trying to get toward EOB, ultimately needed assist intermittently w/o the effort.  Direct assist to get LEs to EOB and to get trunk to upright despite good effort    Transfers Overall transfer level: Needs assistance Equipment used: Rolling walker (2 wheels) Transfers: Sit to/from Stand Sit to Stand: From elevated surface, Mod assist           General transfer comment: Pt showed good effort, ultimatly unable to rise from higher bed height w/o direct assist, mod assist to actually attain standing    Ambulation/Gait Ambulation/Gait assistance: Min assist Gait Distance (Feet): 45 Feet Assistive device: Rolling walker (2 wheels)         General Gait Details: Pt with very slow and shuffling gait, but actually managed to do more than he expected, especially as he states he has not not been out of bed much since arrival.  Stairs            Wheelchair Mobility    Modified Rankin (Stroke Patients Only)       Balance Overall balance assessment: Needs assistance Sitting-balance support: Bilateral  upper extremity supported, Feet supported Sitting balance-Leahy Scale: Fair Sitting balance - Comments: close supervision, pt with difficulty keeping head up while sitting EOB requiring VC    Standing balance support: Bilateral upper extremity supported, During functional activity Standing balance-Leahy Scale: Fair Standing balance comment: pt able to maintain standing balance with BUE support from RW during standing/mobility tasks                             Pertinent Vitals/Pain Pain Assessment Pain Assessment: No/denies pain    Home Living Family/patient expects to be discharged to:: [place]: Rollator (4 wheels) Additional Comments: Has been at [place] for years    Prior Function Prior Level of Function : Independent/Modified Independent;Driving             Mobility Comments: Sleeps in [facility] at [facility]. Mod I for transfers and reports walking to dining hall with rollator 3x/day. ADLs Comments: Mod I for ADL's (stands to bathe, but has shower chair available) and medication management. Reports increased difficulty with LB dressing due to edema/weeping of BLE. IADL's provided by facility.     Hand Dominance        Extremity/Trunk Assessment   Upper Extremity Assessment Upper Extremity Assessment: Overall WFL for tasks assessed    Lower Extremity Assessment Lower Extremity Assessment: Generalized weakness    Cervical / Trunk Assessment Cervical / Trunk Assessment: Kyphotic  Communication   Communication: HOH  Cognition Arousal/Alertness: Awake/alert Behavior During Therapy: WFL for tasks assessed/performed Overall Cognitive Status: Within Functional Limits for tasks assessed                                          General Comments      Exercises     Assessment/Plan    PT Assessment Patient needs continued PT services  PT Problem List Decreased strength;Decreased activity tolerance;Decreased balance;Decreased mobility;Decreased cognition;Decreased safety awareness;Impaired sensation;Pain;Decreased skin integrity;Obesity       PT Treatment  Interventions DME instruction;Gait training;Functional mobility training;Therapeutic activities;Therapeutic exercise;Balance training;Neuromuscular re-education;Cognitive remediation;Patient/family education    PT Goals (Current goals can be found in the Care Plan section)  Acute Rehab PT Goals Patient Stated Goal: get stronger at rehab PT Goal Formulation: With patient Time For Goal Achievement: 03/13/22 Potential to Achieve Goals: Good    Frequency Min 2X/week     Co-evaluation               AM-PAC PT "6 Clicks" Mobility  Outcome Measure Help needed turning from your back to your side while in a flat bed without using bedrails?: A Little Help needed moving from lying on your back to sitting on the side of a flat bed without using bedrails?: A Lot Help needed moving to and from a bed to a chair (including a wheelchair)?: A Little Help needed standing up from a chair using your arms (e.g., wheelchair or bedside chair)?: A Lot Help needed to walk in hospital room?: A Little Help needed climbing 3-5 steps with a railing? : Total 6 Click Score: 14    End of Session Equipment Utilized During Treatment: Gait belt Activity Tolerance: Patient tolerated treatment well;Patient limited by fatigue Patient left: in bed;with call bell/phone within reach Nurse Communication:  Mobility status PT Visit Diagnosis: Unsteadiness on feet (R26.81);Muscle weakness (generalized) (M62.81);Difficulty in walking, not elsewhere classified (R26.2);Pain Pain - Right/Left: Right Pain - part of body: Leg    Time: 0930-1010 PT Time Calculation (min) (ACUTE ONLY): 40 min   Charges:   PT Evaluation $PT Re-evaluation: 1 Re-eval PT Treatments $Gait Training: 8-22 mins        Kreg Shropshire, DPT 02/28/2022, 11:18 AM

## 2022-02-28 NOTE — Assessment & Plan Note (Addendum)
Likely secondary to SIADH.  Continue to watch with diuresis.  Sodium a little higher at 129 today.

## 2022-02-28 NOTE — Assessment & Plan Note (Signed)
Monitor creatinine with diuresis.  Current creatinine 1.37

## 2022-02-28 NOTE — Assessment & Plan Note (Signed)
Physical therapy recommending rehab 

## 2022-02-28 NOTE — Assessment & Plan Note (Signed)
Continue low-dose Eliquis.

## 2022-02-28 NOTE — Assessment & Plan Note (Signed)
EF 30 to 35% with moderate mitral regurgitation.  Patient started back on diuretics.  With creatinine creeping up to 1.49 today I will hold torsemide today and restart at lower dose 20 mg of torsemide daily after that.  Daily weights.  Continue lisinopril.  Added [place].  If creatinine improves can consider adding Aldactone as outpatient.  Follow-up with CHF clinic.  With heart rate in the 60s, beta-blocker contraindicated.

## 2022-02-28 NOTE — Assessment & Plan Note (Addendum)
Last hemoglobin 9.7 with a ferritin of 14.  Consistent with iron deficiency anemia.  Start ferrous sulfate.

## 2022-02-28 NOTE — Assessment & Plan Note (Signed)
On levothyroxine  ?

## 2022-02-28 NOTE — Assessment & Plan Note (Signed)
Blood pressure currently stable on torsemide and lisinopril

## 2022-02-28 NOTE — Consult Note (Signed)
Consultation Note Date: 02/28/2022   Patient Name: Frank Bartlett  DOB: 10/24/1925  MRN: [id number]  Age / Sex: 88 y.o., male  PCP: Frank Shorter, MD Referring Physician: Loletha Grayer, MD  Reason for Consultation: Establishing goals of care  HPI/Patient Profile: Frank Bartlett is a 88 y.o. male with medical history significant of hypertension, hyperlipidemia, hypothyroidism, SSS, s/p of pacemaker placement, renal cell cancer (s/p of left nephrectomy), prostate cancer (s/p of radiation seeds implants), CAD-3A, bradycardia, atrial fibrillation on Eliquis, AAA, obesity with BMI 27.76, who presents with leg edema and abnormal labs, fall.  Upon arriving the hospital, chest x-ray showed mild pulm edema, BNP elevated at 1122.  Patient was given IV Lasix for congestive heart failure, echocardiogram showed ejection fraction 30 to 76%, grade 2 diastolic dysfunction. 10/23.  Patient refused to take all his medications, request to transition to comfort care. 10/24.  Patient feels better today, changed his mind and want to continue treatment.  Restarted diuretics with torsemide.  Clinical Assessment and Goals of Care:  Notes and labs reviewed.  Patient is sitting in bedside chair, no family at bedside.  He states at baseline he lives in assisted living at [facility].  He states he is widowed.  He tells me that he has 3 children. One son that is in [place] for a year as his wife is the Biochemist, clinical for a university, and is there for work.  He has 1 son that works in Engineer, materials and is currently working in [place].  He has a daughter that lives in [place], but has been in [place] visiting with his son.   We discussed his diagnosis, prognosis, GOC, EOL wishes disposition and options.  Created space and opportunity for patient  to explore thoughts and feelings regarding current medical information.  He  states he came into the hospital following a fall the day before.  He tells me he was unaware of his heart failure diagnosis.  A detailed discussion was had today regarding advanced directives.  Concepts specific to code status, artifical feeding and hydration, IV antibiotics and rehospitalization were discussed.  The difference between an aggressive medical intervention path and a comfort care path was discussed.  Values and goals of care important to patient and family were attempted to be elicited.  Discussed limitations of medical interventions to prolong quality of life in some situations and discussed the concept of human mortality.   He states he had opted for hospice level care initially because he felt that his time is limited and that he would die.  He states since then, he has felt better and now wants to continue to treat the treatable.  He confirms DNR/DNI status.    SUMMARY OF RECOMMENDATIONS   Recommend outpatient palliative with transition to hospice when ready.      Primary Diagnoses: Present on Admission:  HLD (hyperlipidemia)  Hypertension  Hypothyroidism  Paroxysmal atrial fibrillation (HCC)  Stage 3a chronic kidney disease (HCC)  Hyponatremia  Depression  Normocytic anemia  Hemorrhoid  I have reviewed the medical record, interviewed the patient and family, and examined the patient. The following aspects are pertinent.  Past Medical History:  Diagnosis Date   AAA (abdominal aortic aneurysm) ([place])    Actinic keratosis 07/22/2017   left lateral crown, midline crown, right of midline crown   Aortic atherosclerosis (HCC)    Atrial fibrillation ([place]) 03/2016   brief spell   B12 deficiency    Basal cell carcinoma 10/30/2016   L lat crown   Bradycardia    Bradycardia 02/2017   Pacer placed   Chronic kidney disease (CKD), stage III (moderate) (HCC)    Hyperlipidemia    Hypertension    Hypothyroidism    Osteoarthritis    Prostate cancer ([place]) 2001   Rad  tx's + seed implants   Renal cancer, left ([place]) 03/2016   Left Renal Nephrectomy   Sick sinus syndrome [facility])    Pacemaker   Social History   Socioeconomic History   Marital status: Widowed    Spouse name: Not on file   Number of children: Not on file   Years of education: Not on file   Highest education level: Not on file  Occupational History   Occupation: Comptroller/accountant    Comment: Retired  Tobacco Use   Smoking status: Never   Smokeless tobacco: Never  Substance and Sexual Activity   Alcohol use: No    Alcohol/week: 0.0 standard drinks of alcohol    Comment: rare wine   Drug use: No   Sexual activity: Not Currently  Other Topics Concern   Not on file  Social History Narrative   Widowed 1/18   3 children      Has living will   Son Frank Bartlett is [facility] POA   Has DNR   No tube feeds if cognitively unaware   Social Determinants of Health   Financial Resource Strain: Not on file  Food Insecurity: No Food Insecurity (02/23/2022)   Hunger Vital Sign    Worried About Running Out of Food in the Last Year: Never true    Ran Out of Food in the Last Year: Never true  Transportation Needs: No Transportation Needs (02/23/2022)   PRAPARE - Hydrologist (Medical): No    Lack of Transportation (Non-Medical): No  Physical Activity: Not on file  Stress: Not on file  Social Connections: Not on file   Family History  Problem Relation Age of Onset   Hypertension Mother    Breast cancer Mother    Colon cancer Father    Prostate cancer Neg Hx    Bladder Cancer Neg Hx    Kidney cancer Neg Hx    Scheduled Meds:  apixaban  2.5 mg Oral BID   [START ON 03/01/2022] dapagliflozin propanediol  5 mg Oral Daily   ferrous sulfate  325 mg Oral Q breakfast   levothyroxine  150 mcg Oral Q0600   lisinopril  5 mg Oral Daily   mupirocin ointment  1 Application Topical Daily   torsemide  40 mg Oral BID   Continuous Infusions: PRN Meds:.acetaminophen,  guaiFENesin, ondansetron (ZOFRAN) IV, mouth rinse, witch hazel-glycerin Medications Prior to Admission:  Prior to Admission medications   Medication Sig Start Date End Date Taking? Authorizing Provider  acetaminophen (TYLENOL) 500 MG tablet Take 1,000 mg by mouth daily. And '1000mg'$  twice a day prn   Yes [provider]  Cyanocobalamin 1000 MCG TBCR Take 1,000 mcg by mouth daily.    Yes [provider]  ELIQUIS 2.5 MG TABS tablet Take 2.5 mg by mouth 2 (two) times daily. 11/04/20  Yes [provider]  levothyroxine (SYNTHROID, LEVOTHROID) 112 MCG tablet Take 112 mcg by mouth daily before breakfast.   Yes [provider]  lisinopril (ZESTRIL) 10 MG tablet Take 10 mg by mouth daily.   Yes [provider]  Multiple Vitamin (MULTI-VITAMINS) TABS Take 1 tablet by mouth daily.   Yes [provider]  mupirocin ointment (BACTROBAN) 2 % 1 Application daily. Apply to scalp daily   Yes [provider]  sertraline (ZOLOFT) 25 MG tablet Take 1 tablet (25 mg total) by mouth daily. 10/06/21  Yes Jearld Fenton, NP  acetaminophen (TYLENOL) 325 MG tablet Take 650 mg by mouth every 4 (four) hours as needed.    [provider]  amLODipine (NORVASC) 5 MG tablet Take 5 mg by mouth daily.    [provider]  Bismuth Subsalicylate (KAOPECTATE PO) Take 10 mLs by mouth as needed. For diarrhea    [provider]  carbamide peroxide (DEBROX) 6.5 % OTIC solution Place 5 drops into both ears as needed.    [provider]  cetirizine (ZYRTEC) 10 MG tablet Take 10 mg by mouth as needed for allergies.    [provider]  dextromethorphan-guaiFENesin (ROBITUSSIN-DM) 10-100 MG/5ML liquid Take 10 mLs by mouth every 4 (four) hours as needed for cough.    [provider]  diphenhydrAMINE HCl (BENADRYL ALLERGY PO) Take 1 tablet by mouth.    [provider]  Glucose 15 GM/32ML GEL Take 1 packet by mouth as needed.  For low blodd sugar    [provider]  magnesium hydroxide (MILK OF MAGNESIA) 400 MG/5ML suspension Take 30 mLs by mouth daily as needed for mild constipation.    [provider]  nystatin (MYCOSTATIN/NYSTOP) powder Apply 1 Application topically as needed.    [provider]  ondansetron (ZOFRAN) 4 MG tablet Take 4 mg by mouth as needed for nausea or vomiting. Up to three times a day    [provider]  Zinc Oxide (DESITIN) 13 % CREA Apply topically. Apply to rectum topically as needed    [provider]   No Known Allergies Review of Systems  All other systems reviewed and are negative.   Physical Exam Pulmonary:     Effort: Pulmonary effort is normal.  Neurological:     Mental Status: He is alert.     Vital Signs: BP 106/61 (BP Location: Left Arm)   Pulse 64   Temp 97.9 F (36.6 C) (Oral)   Resp 18   Ht '5\' 9"'$  (1.753 m)   Wt 76.6 kg   SpO2 98%   BMI 24.94 kg/m  Pain Scale: 0-10   Pain Score: 0-No pain   SpO2: SpO2: 98 % O2 Device:SpO2: 98 % O2 Flow Rate: .   IO: Intake/output summary:  Intake/Output Summary (Last 24 hours) at 02/28/2022 1506 Last data filed at 02/28/2022 1424 Gross per 24 hour  Intake 1080 ml  Output 2200 ml  Net -1120 ml    LBM: Last BM Date : 02/24/22 Baseline Weight: Weight: 79.2 kg Most recent weight: Weight: 76.6 kg       Signed by: Asencion Gowda, NP   Please contact Palliative Medicine Team phone at [phone] for questions and concerns.  For individual provider: See Shea Evans

## 2022-02-28 NOTE — Progress Notes (Signed)
Progress Note   Patient: Frank Bartlett OJJ:[id number] DOB: 09/09/1925 DOA: 02/23/2022     5 DOS: the patient was seen and examined on 02/28/2022   Brief hospital course: ANTWAUN BUTH is a 88 y.o. male with medical history significant of hypertension, hyperlipidemia, hypothyroidism, SSS, s/p of pacemaker placement, renal cell cancer (s/p of left nephrectomy), prostate cancer (s/p of radiation seeds implants), CAD-3A, bradycardia, atrial fibrillation on Eliquis, AAA, obesity with BMI 27.76, who presents with leg edema and abnormal labs, fall.  Upon arriving the hospital, chest x-ray showed mild pulm edema, BNP elevated at 1122.  Patient was given IV Lasix for congestive heart failure, echocardiogram showed ejection fraction 30 to 93%, grade 2 diastolic dysfunction. 10/23.  Patient refused to take all his medications, request to transition to comfort care. 10/24.  Patient feels better today, changed his mind and want to continue treatment.  Restarted diuretics with torsemide.  Assessment and Plan: * Acute on chronic systolic CHF (congestive heart failure) (HCC) EF 30 to 35% with moderate mitral regurgitation.  Patient started back on diuretics.  On torsemide 40 mg twice daily lisinopril 5 mg daily.  With heart rate on the lower side unable to do beta-blocker at this time.  If kidney function remains stable he may be able to Aldactone.  We will add [place] for tomorrow.  Hyponatremia Likely secondary to SIADH.  Continue to watch with diuresis.  Hypertension Blood pressure currently stable on torsemide and lisinopril  Hypothyroidism On levothyroxine  Paroxysmal atrial fibrillation (HCC) Continue low-dose Eliquis.  Stage 3a chronic kidney disease (HCC) Monitor creatinine with diuresis.  Current creatinine 1.37  Normocytic anemia Last hemoglobin 9.7 with a ferritin of 14.  Consistent with iron deficiency anemia.  Start ferrous sulfate.  Weakness Physical therapy recommending  rehab        Subjective: Patient walked a little bit with physical therapy.  Does not feel great but feeling better.  Little short of breath.  Urinating well.  Admitted with lower extremity edema and CHF exacerbation.  Physical Exam: Vitals:   02/28/22 0358 02/28/22 0400 02/28/22 0726 02/28/22 1309  BP: 115/70  121/71 106/61  Pulse: (!) 59  60 64  Resp: '18  18 18  '$ Temp: 98.4 F (36.9 C)  97.6 F (36.4 C) 97.9 F (36.6 C)  TempSrc: Oral  Oral Oral  SpO2: 93%  97% 98%  Weight:  76.6 kg    Height:       Physical Exam HENT:     Head: Normocephalic.     Mouth/Throat:     Pharynx: No oropharyngeal exudate.  Eyes:     General: Lids are normal.     Conjunctiva/sclera: Conjunctivae normal.  Cardiovascular:     Rate and Rhythm: Normal rate and regular rhythm.     Heart sounds: Normal heart sounds, S1 normal and S2 normal.  Pulmonary:     Breath sounds: No decreased breath sounds, wheezing, rhonchi or rales.  Abdominal:     Palpations: Abdomen is soft.     Tenderness: There is no abdominal tenderness.  Musculoskeletal:     Right lower leg: Swelling present.     Left lower leg: Swelling present.  Skin:    General: Skin is warm.     Findings: No rash.  Neurological:     Mental Status: He is alert and oriented to person, place, and time.     Data Reviewed: Echo reviewed, ferritin 14, creatinine 1.37, sodium 127, hemoglobin 9.7   Family Communication:  Left message for patient's  Disposition: Status is: Inpatient Remains inpatient appropriate because: TOC to look into [facility] rehab.  Will need insurance authorization also.  Planned Discharge Destination: Rehab    Time spent: 28 minutes  Author: Loletha Grayer, MD 02/28/2022 1:51 PM  For on call review [email or web address].

## 2022-02-28 NOTE — TOC Progression Note (Addendum)
Transition of Care [facility]) - Progression Note    Patient Details  Name: Frank Bartlett MRN: [id number] Date of Birth: 08/27/1925  Transition of Care [facility]) CM/SW [place], [place] Phone Number: 02/28/2022, 1:32 PM  Clinical Narrative:     Per MD patient has cancelled comfort care and wishes  to go to [facility], per Seth Bake with [facility] patient will need insurance auth.   Insurance authorization has been started with [facility], pending auth at this time.         Expected Discharge Plan and Services                                                 Social Determinants of Health (SDOH) Interventions    Readmission Risk Interventions     No data to display

## 2022-03-01 ENCOUNTER — Telehealth (HOSPITAL_COMMUNITY): Payer: Self-pay | Admitting: Pharmacy Technician

## 2022-03-01 ENCOUNTER — Other Ambulatory Visit (HOSPITAL_COMMUNITY): Payer: Self-pay

## 2022-03-01 DIAGNOSIS — E871 Hypo-osmolality and hyponatremia: Secondary | ICD-10-CM | POA: Diagnosis not present

## 2022-03-01 DIAGNOSIS — R2681 Unsteadiness on feet: Secondary | ICD-10-CM | POA: Diagnosis not present

## 2022-03-01 DIAGNOSIS — I130 Hypertensive heart and chronic kidney disease with heart failure and stage 1 through stage 4 chronic kidney disease, or unspecified chronic kidney disease: Secondary | ICD-10-CM | POA: Diagnosis not present

## 2022-03-01 DIAGNOSIS — K5909 Other constipation: Secondary | ICD-10-CM | POA: Diagnosis not present

## 2022-03-01 DIAGNOSIS — I10 Essential (primary) hypertension: Secondary | ICD-10-CM | POA: Diagnosis not present

## 2022-03-01 DIAGNOSIS — L89626 Pressure-induced deep tissue damage of left heel: Secondary | ICD-10-CM | POA: Diagnosis not present

## 2022-03-01 DIAGNOSIS — M545 Low back pain, unspecified: Secondary | ICD-10-CM | POA: Diagnosis not present

## 2022-03-01 DIAGNOSIS — L98491 Non-pressure chronic ulcer of skin of other sites limited to breakdown of skin: Secondary | ICD-10-CM | POA: Diagnosis not present

## 2022-03-01 DIAGNOSIS — R6889 Other general symptoms and signs: Secondary | ICD-10-CM | POA: Diagnosis not present

## 2022-03-01 DIAGNOSIS — Z950 Presence of cardiac pacemaker: Secondary | ICD-10-CM | POA: Diagnosis not present

## 2022-03-01 DIAGNOSIS — R531 Weakness: Secondary | ICD-10-CM | POA: Diagnosis not present

## 2022-03-01 DIAGNOSIS — R109 Unspecified abdominal pain: Secondary | ICD-10-CM | POA: Diagnosis not present

## 2022-03-01 DIAGNOSIS — I5023 Acute on chronic systolic (congestive) heart failure: Secondary | ICD-10-CM | POA: Diagnosis not present

## 2022-03-01 DIAGNOSIS — F32A Depression, unspecified: Secondary | ICD-10-CM

## 2022-03-01 DIAGNOSIS — M6281 Muscle weakness (generalized): Secondary | ICD-10-CM | POA: Diagnosis not present

## 2022-03-01 DIAGNOSIS — I714 Abdominal aortic aneurysm, without rupture, unspecified: Secondary | ICD-10-CM | POA: Diagnosis not present

## 2022-03-01 DIAGNOSIS — E039 Hypothyroidism, unspecified: Secondary | ICD-10-CM | POA: Diagnosis not present

## 2022-03-01 DIAGNOSIS — N1831 Chronic kidney disease, stage 3a: Secondary | ICD-10-CM | POA: Diagnosis not present

## 2022-03-01 DIAGNOSIS — Z741 Need for assistance with personal care: Secondary | ICD-10-CM | POA: Diagnosis not present

## 2022-03-01 DIAGNOSIS — E46 Unspecified protein-calorie malnutrition: Secondary | ICD-10-CM | POA: Diagnosis not present

## 2022-03-01 DIAGNOSIS — I495 Sick sinus syndrome: Secondary | ICD-10-CM | POA: Diagnosis not present

## 2022-03-01 DIAGNOSIS — I480 Paroxysmal atrial fibrillation: Secondary | ICD-10-CM | POA: Diagnosis not present

## 2022-03-01 DIAGNOSIS — R278 Other lack of coordination: Secondary | ICD-10-CM | POA: Diagnosis not present

## 2022-03-01 DIAGNOSIS — R410 Disorientation, unspecified: Secondary | ICD-10-CM | POA: Diagnosis not present

## 2022-03-01 DIAGNOSIS — R262 Difficulty in walking, not elsewhere classified: Secondary | ICD-10-CM | POA: Diagnosis not present

## 2022-03-01 DIAGNOSIS — I110 Hypertensive heart disease with heart failure: Secondary | ICD-10-CM | POA: Diagnosis not present

## 2022-03-01 DIAGNOSIS — Z7401 Bed confinement status: Secondary | ICD-10-CM | POA: Diagnosis not present

## 2022-03-01 DIAGNOSIS — Z66 Do not resuscitate: Secondary | ICD-10-CM | POA: Diagnosis not present

## 2022-03-01 DIAGNOSIS — F331 Major depressive disorder, recurrent, moderate: Secondary | ICD-10-CM | POA: Diagnosis not present

## 2022-03-01 DIAGNOSIS — R634 Abnormal weight loss: Secondary | ICD-10-CM | POA: Diagnosis not present

## 2022-03-01 DIAGNOSIS — D649 Anemia, unspecified: Secondary | ICD-10-CM | POA: Diagnosis not present

## 2022-03-01 LAB — BASIC METABOLIC PANEL
Anion gap: 13 (ref 5–15)
BUN: 35 mg/dL — ABNORMAL HIGH (ref 8–23)
CO2: 26 mmol/L (ref 22–32)
Calcium: 8.3 mg/dL — ABNORMAL LOW (ref 8.9–10.3)
Chloride: 90 mmol/L — ABNORMAL LOW (ref 98–111)
Creatinine, Ser: 1.49 mg/dL — ABNORMAL HIGH (ref 0.61–1.24)
GFR, Estimated: 43 mL/min — ABNORMAL LOW (ref >60)
Glucose, Bld: 99 mg/dL (ref 70–99)
Potassium: 3.3 mmol/L — ABNORMAL LOW (ref 3.5–5.1)
Sodium: 129 mmol/L — ABNORMAL LOW (ref 135–145)

## 2022-03-01 MED ORDER — TORSEMIDE 20 MG PO TABS: 20.0000 mg | ORAL_TABLET | Freq: Two times a day (BID) | ORAL | Status: DC

## 2022-03-01 MED ORDER — DAPAGLIFLOZIN PROPANEDIOL 5 MG PO TABS: 5.0000 mg | ORAL_TABLET | Freq: Every day | ORAL | 0 refills | Status: DC

## 2022-03-01 MED ORDER — FERROUS SULFATE 325 (65 FE) MG PO TABS: 325.0000 mg | ORAL_TABLET | Freq: Every day | ORAL | 0 refills | Status: AC

## 2022-03-01 MED ORDER — POTASSIUM CHLORIDE CRYS ER 20 MEQ PO TBCR
40.0000 meq | EXTENDED_RELEASE_TABLET | Freq: Once | ORAL | Status: AC
  Administered 2022-03-01: 40 meq via ORAL
  Filled 2022-03-01: qty 2

## 2022-03-01 MED ORDER — WITCH HAZEL-GLYCERIN EX PADS: MEDICATED_PAD | CUTANEOUS | 0 refills | Status: AC | PRN

## 2022-03-01 MED ORDER — TORSEMIDE 20 MG PO TABS: 20.0000 mg | ORAL_TABLET | Freq: Every day | ORAL | 0 refills | Status: DC

## 2022-03-01 MED ORDER — LISINOPRIL 5 MG PO TABS: 5.0000 mg | ORAL_TABLET | Freq: Every day | ORAL | 0 refills | Status: DC

## 2022-03-01 NOTE — Telephone Encounter (Signed)
Pharmacy Patient Advocate Encounter  Insurance verification completed.    The patient is insured through [employer] Part D   The patient is currently admitted and ran test claims for the following: Farxiga 10 mg.  Copays and coinsurance results were relayed to Inpatient clinical team.

## 2022-03-01 NOTE — Progress Notes (Signed)
[facility]) Hospital Liaison Note:   Referral received to outpatient palliative services at [facility] for [name].   Please call with any questions related to hospice or palliative care.    Thank you, Kenna Gilbert BSN, RN  [facility] Liaison  [phone]

## 2022-03-01 NOTE — Discharge Summary (Signed)
Physician Discharge Summary   Patient: Frank Bartlett MRN: [id number] DOB: 07/19/1925  Admit date:     02/23/2022  Discharge date: 03/01/22  Discharge Physician: Loletha Grayer   PCP: Dewayne Shorter, MD   Recommendations at discharge:   Follow-up with Dr. Ernst Spell 1 day Follow-up CHF clinic  Discharge Diagnoses: Principal Problem:   Acute on chronic systolic CHF (congestive heart failure) ([place]) Active Problems:   Hyponatremia   Hypertension   HLD (hyperlipidemia)   Hypothyroidism   Paroxysmal atrial fibrillation (HCC)   Stage 3a chronic kidney disease (HCC)   Normocytic anemia   Hemorrhoid   Depression   Weakness    Hospital Course: Frank Bartlett is a 88 y.o. male with medical history significant of hypertension, hyperlipidemia, hypothyroidism, SSS, s/p of pacemaker placement, renal cell cancer (s/p of left nephrectomy), prostate cancer (s/p of radiation seeds implants), CAD-3A, bradycardia, atrial fibrillation on Eliquis, AAA, obesity with BMI 27.76, who presents with leg edema and abnormal labs, fall.  Upon arriving the hospital, chest x-ray showed mild pulm edema, BNP elevated at 1122.  Patient was given IV Lasix for congestive heart failure, echocardiogram showed ejection fraction 30 to 36%, grade 2 diastolic dysfunction. 10/23.  Patient refused to take all his medications, request to transition to comfort care. 10/24.  Patient feels better today, changed his mind and want to continue treatment.  Restarted diuretics with torsemide.  10/25 and 10/26.  Patient breathing better.  Currently on room air.  Increased up to 1.49.  Will hold torsemide today and restart at lower dose 20 mg daily starting tomorrow.  Recommend checking a a BMP in 3 to 4 days.  Patient is a DNR.  Recommend palliative care following at facility.  Assessment and Plan: * Acute on chronic systolic CHF (congestive heart failure) (HCC) EF 30 to 35% with moderate mitral regurgitation.  Patient  started back on diuretics.  With creatinine creeping up to 1.49 today I will hold torsemide today and restart at lower dose 20 mg of torsemide daily after that.  Daily weights.  Continue lisinopril.  Added [place].  If creatinine improves can consider adding Aldactone as outpatient.  Follow-up with CHF clinic.  With heart rate in the 60s, beta-blocker contraindicated.  Recommend palliative care also following at facility.  Hyponatremia Likely secondary to SIADH.  Continue to watch with diuresis.  Sodium a little higher at 129 today.  Hypertension Blood pressure currently stable on torsemide and lisinopril  Hypothyroidism On levothyroxine  Paroxysmal atrial fibrillation (HCC) Continue low-dose Eliquis.  Stage 3a chronic kidney disease (HCC) Monitor creatinine with diuresis.  Creatinine increased to 1.49 today.  Holding diuresis today and restart torsemide 20 mg tomorrow.  Recommend checking BMP in 3 to 4 days.  Normocytic anemia Last hemoglobin 9.7 with a ferritin of 14.  Consistent with iron deficiency anemia.  Start ferrous sulfate.  Recommend checking a hemoglobin in 3 to 4 days.  Depression Continue Zoloft  Weakness Physical therapy recommending rehab         Consultants: Palliative care Procedures performed: None Disposition: Rehabilitation facility Diet recommendation:  Cardiac diet DISCHARGE MEDICATION: Allergies as of 03/01/2022   No Known Allergies      Medication List     STOP taking these medications    amLODipine 5 MG tablet Commonly known as: NORVASC   BENADRYL ALLERGY PO       TAKE these medications    acetaminophen 325 MG tablet Commonly known as: TYLENOL Take 650 mg by mouth every  4 (four) hours as needed. What changed: Another medication with the same name was removed. Continue taking this medication, and follow the directions you see here.   carbamide peroxide 6.5 % OTIC solution Commonly known as: DEBROX Place 5 drops into both ears  as needed.   cetirizine 10 MG tablet Commonly known as: ZYRTEC Take 10 mg by mouth as needed for allergies.   Cyanocobalamin 1000 MCG Tbcr Take 1,000 mcg by mouth daily.   dapagliflozin propanediol 5 MG Tabs tablet Commonly known as: FARXIGA Take 1 tablet (5 mg total) by mouth daily.   Desitin 13 % Crea Generic drug: Zinc Oxide Apply topically. Apply to rectum topically as needed   dextromethorphan-guaiFENesin 10-100 MG/5ML liquid Commonly known as: ROBITUSSIN-DM Take 10 mLs by mouth every 4 (four) hours as needed for cough.   Eliquis 2.5 MG Tabs tablet Generic drug: apixaban Take 2.5 mg by mouth 2 (two) times daily.   ferrous sulfate 325 (65 FE) MG tablet Take 1 tablet (325 mg total) by mouth daily with breakfast. Start taking on: March 02, 2022   Glucose 15 GM/32ML Gel Take 1 packet by mouth as needed. For low blodd sugar   KAOPECTATE PO Take 10 mLs by mouth as needed. For diarrhea   levothyroxine 112 MCG tablet Commonly known as: SYNTHROID Take 112 mcg by mouth daily before breakfast.   lisinopril 5 MG tablet Commonly known as: ZESTRIL Take 1 tablet (5 mg total) by mouth daily. What changed:  medication strength how much to take   Milk of Magnesia 400 MG/5ML suspension Generic drug: magnesium hydroxide Take 30 mLs by mouth daily as needed for mild constipation.   Multi-Vitamins Tabs Take 1 tablet by mouth daily.   mupirocin ointment 2 % Commonly known as: BACTROBAN 1 Application daily. Apply to scalp daily   nystatin powder Commonly known as: MYCOSTATIN/NYSTOP Apply 1 Application topically as needed.   ondansetron 4 MG tablet Commonly known as: ZOFRAN Take 4 mg by mouth as needed for nausea or vomiting. Up to three times a day   sertraline 25 MG tablet Commonly known as: ZOLOFT Take 1 tablet (25 mg total) by mouth daily.   torsemide 20 MG tablet Commonly known as: DEMADEX Take 1 tablet (20 mg total) by mouth daily.   witch hazel-glycerin  pad Commonly known as: TUCKS Apply topically as needed for itching.        Follow-up Information     Doctor at rehab Follow up in 1 day(s).                 Discharge Exam: Filed Weights   02/27/22 0522 02/28/22 0400 03/01/22 0324  Weight: 74.6 kg 76.6 kg 74.8 kg   Physical Exam HENT:     Head: Normocephalic.     Mouth/Throat:     Pharynx: No oropharyngeal exudate.  Eyes:     General: Lids are normal.     Conjunctiva/sclera: Conjunctivae normal.  Cardiovascular:     Rate and Rhythm: Normal rate and regular rhythm.     Heart sounds: Normal heart sounds, S1 normal and S2 normal.  Pulmonary:     Breath sounds: Examination of the right-lower field reveals decreased breath sounds. Examination of the left-lower field reveals decreased breath sounds. Decreased breath sounds present. No wheezing, rhonchi or rales.  Abdominal:     Palpations: Abdomen is soft.     Tenderness: There is no abdominal tenderness.  Musculoskeletal:     Right lower leg: Swelling present.  Left lower leg: Swelling present.  Skin:    General: Skin is warm.     Findings: No rash.  Neurological:     Mental Status: He is alert and oriented to person, place, and time.      Condition at discharge: stable  The results of significant diagnostics from this hospitalization (including imaging, microbiology, ancillary and laboratory) are listed below for reference.   Imaging Studies: ECHOCARDIOGRAM COMPLETE  Result Date: 02/24/2022    ECHOCARDIOGRAM REPORT   Patient Name:   ROALD LUKACS Date of Exam: 02/24/2022 Medical Rec #:  [id number]          Height:       69.0 in Accession #:    [id number]         Weight:       188.0 lb Date of Birth:  [birth year]          BSA:          2.012 m Patient Age:    22 years           BP:           123/69 mmHg Patient Gender: M                  HR:           58 bpm. Exam Location:  [facility] Procedure: 2D Echo, Color Doppler and Cardiac Doppler Indications:     CHF  I50.31  History:         Patient has no prior history of Echocardiogram examinations.  Sonographer:     Kathlen Brunswick RDCS Referring Phys:  Unknown Foley NIU Diagnosing Phys: Neoma Laming  Sonographer Comments: Technically difficult study due to poor echo windows. Image acquisition challenging due to uncooperative patient. IMPRESSIONS  1. Left ventricular ejection fraction, by estimation, is 30 to 35%. The left ventricle has moderately decreased function. The left ventricle demonstrates global hypokinesis. The left ventricular internal cavity size was moderately dilated. There is mild  concentric left ventricular hypertrophy. Left ventricular diastolic parameters are consistent with Grade II diastolic dysfunction (pseudonormalization).  2. Right ventricular systolic function is moderately reduced. The right ventricular size is moderately enlarged.  3. Left atrial size was severely dilated.  4. Right atrial size was severely dilated.  5. The mitral valve is normal in structure. Moderate mitral valve regurgitation. No evidence of mitral stenosis.  6. Tricuspid valve regurgitation is moderate.  7. The aortic valve is calcified. Aortic valve regurgitation is mild. Aortic valve sclerosis/calcification is present, without any evidence of aortic stenosis.  8. The inferior vena cava is normal in size with greater than 50% respiratory variability, suggesting right atrial pressure of 3 mmHg. Conclusion(s)/Recommendation(s): Findings consistent with dilated cardiomyopathy. Unable to exclude left ventricular thrombus, would recommend a repeat transthoracic echocardiogram with contrast. Cannot r/o LV apical thrombus. FINDINGS  Left Ventricle: Left ventricular ejection fraction, by estimation, is 30 to 35%. The left ventricle has moderately decreased function. The left ventricle demonstrates global hypokinesis. The left ventricular internal cavity size was moderately dilated. There is mild concentric left ventricular hypertrophy.  Left ventricular diastolic parameters are consistent with Grade II diastolic dysfunction (pseudonormalization). Right Ventricle: The right ventricular size is moderately enlarged. No increase in right ventricular wall thickness. Right ventricular systolic function is moderately reduced. Left Atrium: Left atrial size was severely dilated. Right Atrium: Right atrial size was severely dilated. Pericardium: There is no evidence of pericardial effusion. Mitral Valve: The mitral valve is normal in  structure. Moderate mitral valve regurgitation. No evidence of mitral valve stenosis. Tricuspid Valve: The tricuspid valve is normal in structure. Tricuspid valve regurgitation is moderate . No evidence of tricuspid stenosis. Aortic Valve: The aortic valve is calcified. Aortic valve regurgitation is mild. Aortic valve sclerosis/calcification is present, without any evidence of aortic stenosis. Aortic valve peak gradient measures 7.7 mmHg. Pulmonic Valve: The pulmonic valve was normal in structure. Pulmonic valve regurgitation is not visualized. No evidence of pulmonic stenosis. Aorta: The aortic root is normal in size and structure. Venous: The inferior vena cava is normal in size with greater than 50% respiratory variability, suggesting right atrial pressure of 3 mmHg. IAS/Shunts: No atrial level shunt detected by color flow Doppler.  LEFT VENTRICLE PLAX 2D LVIDd:         5.10 cm      Diastology LVIDs:         4.50 cm      LV e' medial:    4.68 cm/s LV PW:         1.20 cm      LV E/e' medial:  19.6 LV IVS:        1.00 cm      LV e' lateral:   18.10 cm/s LVOT diam:     2.40 cm      LV E/e' lateral: 5.1 LV SV:         74 LV SV Index:   37 LVOT Area:     4.52 cm  LV Volumes (MOD) LV vol d, MOD A4C: 134.0 ml LV vol s, MOD A4C: 90.6 ml LV SV MOD A4C:     134.0 ml LEFT ATRIUM            Index LA diam:      4.40 cm  2.19 cm/m LA Vol (A4C): 109.0 ml 54.16 ml/m  AORTIC VALVE AV Area (Vmax): 3.22 cm AV Vmax:        139.00 cm/s AV  Peak Grad:   7.7 mmHg LVOT Vmax:      98.97 cm/s LVOT Vmean:     64.100 cm/s LVOT VTI:       0.163 m  AORTA Ao Root diam: 4.10 cm Ao Asc diam:  3.50 cm MITRAL VALVE               TRICUSPID VALVE MV Area (PHT): 3.06 cm    TV Peak grad:   34.0 mmHg MV Decel Time: 248 msec    TV Vmax:        2.92 m/s MV E velocity: 91.70 cm/s  TR Peak grad:   41.2 mmHg MV A velocity: 34.60 cm/s  TR Vmax:        321.00 cm/s MV E/A ratio:  2.65                            SHUNTS                            Systemic VTI:  0.16 m                            Systemic Diam: 2.40 cm Neoma Laming Electronically signed by Neoma Laming Signature Date/Time: 02/24/2022/5:51:28 PM    Final    CT HEAD WO CONTRAST (5MM)  Result Date: 02/23/2022 CLINICAL DATA:  Head trauma, minor (Age >= 65y) EXAM: CT  HEAD WITHOUT CONTRAST TECHNIQUE: Contiguous axial images were obtained from the base of the skull through the vertex without intravenous contrast. RADIATION DOSE REDUCTION: This exam was performed according to the departmental dose-optimization program which includes automated exposure control, adjustment of the mA and/or kV according to patient size and/or use of iterative reconstruction technique. COMPARISON:  CT head August 05, 2017. FINDINGS: Brain: No evidence of acute infarction, hemorrhage, hydrocephalus, extra-axial collection or mass lesion/mass effect. Mild for age patchy white matter hypodensities, nonspecific but compatible with chronic ischemic disease. Vascular: No hyperdense vessel identified. Skull: No acute fracture. Sinuses/Orbits: Partial right sphenoid sinus opacification with scattered paranasal sinus mucosal thickening. No acute orbital findings. Other: No mastoid effusions. IMPRESSION: No evidence of acute intracranial abnormality. Electronically Signed   By: Margaretha Sheffield M.D.   On: 02/23/2022 11:39   DG Chest 2 View  Result Date: 02/23/2022 CLINICAL DATA:  88 year old male with history of edema. EXAM: CHEST - 2 VIEW  COMPARISON:  Chest x-ray 04/25/2020. FINDINGS: There is cephalization of the pulmonary vasculature and slight indistinctness of the interstitial markings suggestive of mild pulmonary edema. Small bilateral pleural effusions. Bibasilar opacities which may reflect areas of atelectasis and/or consolidation. Mild cardiomegaly. Upper mediastinal contours are within normal limits allowing for mild patient rotation. Atherosclerotic calcifications are noted in the thoracic aorta. Left pacemaker with lead tips projecting over the expected location of the right atrium and right ventricle. IMPRESSION: 1. The appearance of the chest suggests mild congestive heart failure. 2. Bibasilar opacities favored to reflect areas of subsegmental atelectasis, although areas of airspace consolidation are not excluded. 3. Aortic atherosclerosis. Electronically Signed   By: Vinnie Langton M.D.   On: 02/23/2022 07:43    Microbiology: Results for orders placed or performed during the hospital encounter of 02/23/22  MRSA Next Gen by PCR, Nasal     Status: None   Collection Time: 02/25/22  5:41 AM   Specimen: Nasal Mucosa; Nasal Swab  Result Value Ref Range Status   MRSA by PCR Next Gen NOT DETECTED NOT DETECTED Final    Comment: (NOTE) The GeneXpert MRSA Assay (FDA approved for NASAL specimens only), is one component of a comprehensive MRSA colonization surveillance program. It is not intended to diagnose MRSA infection nor to guide or monitor treatment for MRSA infections. Test performance is not FDA approved in patients less than 50 years old. Performed at [facility], [address] [place], [place] [address]     Labs: CBC: Recent Labs  Lab 02/23/22 0715 02/23/22 1359 02/24/22 0236 02/24/22 0829 02/25/22 0542 02/26/22 0653 02/28/22 0433  WBC 4.8   < > 5.5 6.3 4.8 4.9 5.8  NEUTROABS 3.6  --   --  5.0  --   --   --   HGB 10.8*   < > 10.5* 10.3* 9.4* 10.0* 9.7*  HCT 33.4*   < > 32.2* 31.4* 27.7*  29.3* 28.9*  MCV 87.0   < > 86.8 86.5 84.2 85.2 85.5  PLT 249   < > 258 250 237 234 226   < > = values in this interval not displayed.   Basic Metabolic Panel: Recent Labs  Lab 02/23/22 0715 02/23/22 1359 02/24/22 0236 02/25/22 0542 02/26/22 9449 02/28/22 0433 03/01/22 0625  NA 126*   < > 127* 129* 128* 127* 129*  K 4.1   < > 3.8 3.5 3.3* 3.7 3.3*  CL 96*   < > 96* 99 94* 91* 90*  CO2 21*   < >  $'23 23 24 26 26  'v$ GLUCOSE 101*   < > 107* 109* 102* 100* 99  BUN 22   < > '21 20 18 '$ 28* 35*  CREATININE 1.15   < > 1.28* 1.20 1.30* 1.37* 1.49*  CALCIUM 8.9   < > 8.5* 8.2* 8.3* 8.0* 8.3*  MG 2.0  --  1.7 1.9 1.8 1.8  --   PHOS 3.4  --   --   --   --   --   --    < > = values in this interval not displayed.   Liver Function Tests: Recent Labs  Lab 02/23/22 0715  AST 31  ALT 29  ALKPHOS 90  BILITOT 0.9  PROT 7.2  ALBUMIN 3.7    Discharge time spent: greater than 30 minutes.  Signed: Loletha Grayer, MD [facility] 03/01/2022

## 2022-03-01 NOTE — Assessment & Plan Note (Signed)
Continue Zoloft 

## 2022-03-01 NOTE — TOC Progression Note (Signed)
Transition of Care [facility]) - Progression Note    Patient Details  Name: Frank Bartlett MRN: [id number] Date of Birth: 1925/07/08  [facility] CM/SW [place], [place] Phone Number: 03/01/2022, 9:21 AM  Clinical Narrative:     Patient has insurance authorization for [facility] Auth ID [id number] Candace Cruise ID [id number]  MD and facility updated.        Expected Discharge Plan and Services                                                 Social Determinants of Health (SDOH) Interventions    Readmission Risk Interventions     No data to display

## 2022-03-01 NOTE — TOC Benefit Eligibility Note (Signed)
Patient Teacher, English as a foreign language completed.    The patient is currently admitted and upon discharge could be taking Farxiga 10 mg.  The current 30 day co-pay is $151.18 due to being in Coverage Gap (donut hole).   The patient is insured through [place] Patient Advocate Specialist [place] Patient Advocate Team Direct Number: [phone]  Fax: [phone]

## 2022-03-01 NOTE — TOC Transition Note (Signed)
Transition of Care [facility]) - CM/SW Discharge Note   Patient Details  Name: Frank Bartlett MRN: [id number] Date of Birth: [birth year]  Transition [facility] Care [facility]) CM/SW Contact:  Amador Cunas, [place] Phone Number: 03/01/2022, 2:42 PM   Clinical Narrative:   Pt for dc to SNF level of care at [facility] today. [facility] to follow for outpt palliative. Seth Bake at [facility] confirmed they are prepared to admit pt to room 111. Ambulance transport arranged and RN provided with number for report. SW signing off at dc.   Wandra Feinstein, MSW, LCSW [phone] (coverage)      Final next level of care: Skilled Nursing Facility Barriers to Discharge: No Barriers Identified   Patient Goals and CMS Choice   CMS Medicare.gov Compare Post Acute Care list provided to:: Patient Choice offered to / list presented to : Patient  Discharge Placement              Patient chooses bed at: [facility] Patient to be transferred to facility by: [facility] Name of family member notified: pt Ox3, unable to reach family Patient and family notified of of transfer: 03/01/22  Discharge Plan and Services                                     Social Determinants of Health (SDOH) Interventions     Readmission Risk Interventions     No data to display

## 2022-03-02 ENCOUNTER — Non-Acute Institutional Stay (SKILLED_NURSING_FACILITY): Payer: Medicare Other | Admitting: Student

## 2022-03-02 ENCOUNTER — Encounter: Payer: Self-pay | Admitting: Student

## 2022-03-02 DIAGNOSIS — Z66 Do not resuscitate: Secondary | ICD-10-CM | POA: Diagnosis not present

## 2022-03-02 DIAGNOSIS — E871 Hypo-osmolality and hyponatremia: Secondary | ICD-10-CM | POA: Diagnosis not present

## 2022-03-02 DIAGNOSIS — R159 Full incontinence of feces: Secondary | ICD-10-CM

## 2022-03-02 DIAGNOSIS — I714 Abdominal aortic aneurysm, without rupture, unspecified: Secondary | ICD-10-CM

## 2022-03-02 DIAGNOSIS — I495 Sick sinus syndrome: Secondary | ICD-10-CM | POA: Diagnosis not present

## 2022-03-02 DIAGNOSIS — I480 Paroxysmal atrial fibrillation: Secondary | ICD-10-CM | POA: Diagnosis not present

## 2022-03-02 DIAGNOSIS — F331 Major depressive disorder, recurrent, moderate: Secondary | ICD-10-CM | POA: Diagnosis not present

## 2022-03-02 DIAGNOSIS — N1831 Chronic kidney disease, stage 3a: Secondary | ICD-10-CM

## 2022-03-02 DIAGNOSIS — R410 Disorientation, unspecified: Secondary | ICD-10-CM | POA: Diagnosis not present

## 2022-03-02 DIAGNOSIS — I10 Essential (primary) hypertension: Secondary | ICD-10-CM | POA: Diagnosis not present

## 2022-03-02 DIAGNOSIS — I5023 Acute on chronic systolic (congestive) heart failure: Secondary | ICD-10-CM

## 2022-03-02 DIAGNOSIS — E039 Hypothyroidism, unspecified: Secondary | ICD-10-CM

## 2022-03-02 NOTE — Progress Notes (Signed)
Provider:  Tomasa Rand, MD  Location:  [facility] Room Number: 111-A Place of Service:  ALF [phone]  PCP: Dewayne Shorter, MD Patient Care Team: Dewayne Shorter, MD as PCP - General (Family Medicine) Lauree Chandler, NP as Nurse Practitioner (Geriatric Medicine)  Extended Emergency Contact Information Primary Emergency Contact: [facility] Address: [place] [address] [name] of [place] Phone: [phone] Mobile Phone: [phone] Relation: Son Secondary Emergency Contact: Leanora Ivanoff [place] of [place] Phone: [phone] Mobile Phone: [phone] Relation: Son  Code Status: DNR Goals of Care: Advanced Directive information    03/02/2022   11:12 AM  Advanced Directives  Does Patient Have a Medical Advance Directive? Yes  Type of Advance Directive Living will;Out of facility DNR (pink MOST or yellow form)  Does patient want to make changes to medical advance directive? No - Patient declined  Pre-existing out of facility DNR order (yellow form or pink MOST form) Yellow form placed in chart (order not valid for inpatient use)      Chief Complaint  Patient presents with   New Admission to [place] admission to [facility]     HPI: Patient is a 88 y.o. male seen today for admission to 3.5 in [place]  [place] total [place]. They moved here in [place] 08, 1996. She had alzheimer's and passed away in July 08, 2013.  His typical day was involving activities at [facility]. He fell 2 weeks ago which is what put him in the hospital. He uses a rollator all the time. No rollator - [place] He took his own showers and looked after himself.   He doesn't feel like he can go back to his apartment. He reached a point where he was ready to die. He had the impression that he went to the [employer] football game in person. He doesn't see himself improving to his normal. He feels like it's  he woudn't be upset if he were to die.   Son from [place] and son in [place]. Children were visiting son in [place]. He has been there >1 year.   This is the first time he was told he had heart failure.   His son [name] is here from [place].   Advance Care Planning  Discussed patient's goals of care and he states he would like to remain comfortable at [facility] if he is to become ill in the future. He is okay with continuing with therapy at this time, but if he isn't having improvement is okay with staying in skilled level of care and transitioning to hospice at that time. He does not want any life-prolonging interventions. Declines feeding tube or fluids if he stops eating. He does not want to return to the hospital.       Past Medical History:  Diagnosis Date   AAA (abdominal aortic aneurysm) ([place])    Actinic keratosis 07/22/2017   left lateral crown, midline crown, right of midline crown   Aortic atherosclerosis (HCC)    Atrial fibrillation ([place]) 03/2016   brief spell   B12 deficiency    Basal cell carcinoma 10/30/2016   L lat crown   Bradycardia    Bradycardia 02/2017   Pacer placed   Chronic kidney disease (CKD), stage III (moderate) (HCC)    Hyperlipidemia    Hypertension    Hypothyroidism  Osteoarthritis    Prostate cancer ([place]) 2001   Rad tx's + seed implants   Renal cancer, left ([place]) 03/2016   Left Renal Nephrectomy   Sick sinus syndrome [facility])    Pacemaker   Past Surgical History:  Procedure Laterality Date   CATARACT EXTRACTION, BILATERAL     COLONOSCOPY     EXCISIONAL HEMORRHOIDECTOMY     INSERTION PROSTATE RADIATION SEED     LAPAROSCOPIC NEPHRECTOMY, HAND ASSISTED Left 03/08/2016   Procedure: HAND ASSISTED LAPAROSCOPIC NEPHRECTOMY;  Surgeon: Nickie Retort, MD;  Location: [facility] ORS;  Service: Urology;  Laterality: Left;   LAPAROTOMY N/A 03/13/2016   Procedure: EXPLORATORY LAPAROTOMY;  Surgeon: Festus Aloe, MD;  Location: [facility] ORS;  Service: Urology;   Laterality: N/A;   PACEMAKER INSERTION N/A 02/20/2017   Procedure: INSERTION PACEMAKER;  Surgeon: Isaias Cowman, MD;  Location: [facility] ORS;  Service: Cardiovascular;  Laterality: N/A;    reports that he has never smoked. He has never used smokeless tobacco. He reports that he does not drink alcohol and does not use drugs. Social History   Socioeconomic History   Marital status: Widowed    Spouse name: Not on file   Number of children: Not on file   Years of education: Not on file   Highest education level: Not on file  Occupational History   Occupation: Comptroller/accountant    Comment: Retired  Tobacco Use   Smoking status: Never   Smokeless tobacco: Never  Vaping Use   Vaping Use: Never used  Substance and Sexual Activity   Alcohol use: No    Alcohol/week: 0.0 standard drinks of alcohol    Comment: rare wine   Drug use: No   Sexual activity: Not Currently  Other Topics Concern   Not on file  Social History Narrative   Widowed 1/18   3 children      Has living will   Son [name] is [facility] POA   Has DNR   No tube feeds if cognitively unaware   Social Determinants of Health   Financial Resource Strain: Not on file  Food Insecurity: No Food Insecurity (02/23/2022)   Hunger Vital Sign    Worried About Running Out of Food in the Last Year: Never true    Ran Out of Food in the Last Year: Never true  Transportation Needs: No Transportation Needs (02/23/2022)   PRAPARE - Hydrologist (Medical): No    Lack of Transportation (Non-Medical): No  Physical Activity: Not on file  Stress: Not on file  Social Connections: Not on file  Intimate Partner Violence: Not At Risk (02/23/2022)   Humiliation, Afraid, Rape, and Kick questionnaire    Fear of Current or Ex-Partner: No    Emotionally Abused: No    Physically Abused: No    Sexually Abused: No    Functional Status Survey:    Family History  Problem Relation Age of Onset   Hypertension  Mother    Breast cancer Mother    Colon cancer Father    Prostate cancer Neg Hx    Bladder Cancer Neg Hx    Kidney cancer Neg Hx     Health Maintenance  Topic Date Due   Medicare Annual Wellness (AWV)  Never done   Zoster Vaccines- Shingrix (1 of 2) Never done   COVID-19 Vaccine (4 - Mixed Product risk series) 11/28/2021   TETANUS/TDAP  04/12/2028   Pneumonia Vaccine 11+ Years old  Completed   INFLUENZA VACCINE  Completed   HPV VACCINES  Aged Out    No Known Allergies  Outpatient Encounter Medications as of 03/02/2022  Medication Sig   acetaminophen (TYLENOL) 650 MG CR tablet Take 650 mg by mouth every 4 (four) hours as needed for pain.   Bismuth Subsalicylate (KAOPECTATE PO) Take 10 mLs by mouth as needed. For diarrhea   carbamide peroxide (DEBROX) 6.5 % OTIC solution Place 5 drops into both ears as needed.   cetirizine (ZYRTEC) 10 MG tablet Take 10 mg by mouth as needed for allergies.   Cyanocobalamin 1000 MCG TBCR Take 1,000 mcg by mouth daily.    dapagliflozin propanediol (FARXIGA) 5 MG TABS tablet Take 1 tablet (5 mg total) by mouth daily.   dextromethorphan-guaiFENesin (ROBITUSSIN-DM) 10-100 MG/5ML liquid Take 10 mLs by mouth every 4 (four) hours as needed for cough.   ELIQUIS 2.5 MG TABS tablet Take 2.5 mg by mouth 2 (two) times daily.   ferrous sulfate 325 (65 FE) MG tablet Take 1 tablet (325 mg total) by mouth daily with breakfast.   levothyroxine (SYNTHROID, LEVOTHROID) 112 MCG tablet Take 112 mcg by mouth daily before breakfast.   lisinopril (ZESTRIL) 5 MG tablet Take 1 tablet (5 mg total) by mouth daily.   magnesium hydroxide (MILK OF MAGNESIA) 400 MG/5ML suspension Take 30 mLs by mouth daily as needed for mild constipation.   Multiple Vitamin (MULTI-VITAMINS) TABS Take 1 tablet by mouth daily.   mupirocin ointment (BACTROBAN) 2 % 1 Application daily. Apply to scalp daily   nystatin (MYCOSTATIN/NYSTOP) powder Apply 1 Application topically as needed.   ondansetron  (ZOFRAN) 4 MG tablet Take 4 mg by mouth as needed for nausea or vomiting. Up to three times a day   sertraline (ZOLOFT) 25 MG tablet Take 1 tablet (25 mg total) by mouth daily.   torsemide (DEMADEX) 20 MG tablet Take 1 tablet (20 mg total) by mouth daily.   witch hazel-glycerin (TUCKS) pad Apply topically as needed for itching.   Zinc Oxide (DESITIN) 13 % CREA Apply topically. Apply to rectum topically as needed   acetaminophen (TYLENOL) 325 MG tablet Take 650 mg by mouth every 4 (four) hours as needed.   [DISCONTINUED] Glucose 15 GM/32ML GEL Take 1 packet by mouth as needed. For low blodd sugar   No facility-administered encounter medications on file as of 03/02/2022.    Review of Systems  Genitourinary:        Fecal and Urinary incontinence   Psychiatric/Behavioral:  Positive for dysphoric mood.   All other systems reviewed and are negative.  Vitals:   03/02/22 1101  BP: 105/66  Pulse: 60  Resp: 18  Temp: (!) 95.4 F (35.2 C)  Weight: 165 lb (74.8 kg)  Height: _0  (1.753 m)   Body mass index is 24.37 kg/m. Physical Exam Constitutional:      Appearance: Normal appearance.  Cardiovascular:     Rate and Rhythm: Normal rate. Rhythm irregular.     Pulses: Normal pulses.  Pulmonary:     Effort: Pulmonary effort is normal.     Breath sounds: Normal breath sounds.  Abdominal:     General: Abdomen is flat. Bowel sounds are normal.     Palpations: Abdomen is soft.     Comments: Ventral abdominal hernia  Musculoskeletal:        General: Tenderness present. No swelling.     Comments: Right heel with increased redness, left foot normal.   Neurological:     Mental Status: He is alert  and oriented to person, place, and time. Mental status is at baseline.  Psychiatric:     Comments: Depressed mood   Labs reviewed: Basic Metabolic Panel: Recent Labs    02/23/22 0715 02/23/22 1359 02/25/22 0542 02/26/22 0653 02/28/22 0433 03/01/22 0625  NA 126*   < > 129* 128* 127* 129*   K 4.1   < > 3.5 3.3* 3.7 3.3*  CL 96*   < > 99 94* 91* 90*  CO2 21*   < > _0 GLUCOSE 101*   < > 109* 102* 100* 99  BUN 22   < > 20 18 28* 35*  CREATININE 1.15   < > 1.20 1.30* 1.37* 1.49*  CALCIUM 8.9   < > 8.2* 8.3* 8.0* 8.3*  MG 2.0   < > 1.9 1.8 1.8  --   PHOS 3.4  --   --   --   --   --    < > = values in this interval not displayed.   Liver Function Tests: Recent Labs    02/23/22 0715  AST 31  ALT 29  ALKPHOS 90  BILITOT 0.9  PROT 7.2  ALBUMIN 3.7   No results for input(s): "LIPASE", "AMYLASE" in the last 8760 hours. No results for input(s): "AMMONIA" in the last 8760 hours. CBC: Recent Labs    02/23/22 0715 02/23/22 1359 02/24/22 0829 02/25/22 0542 02/26/22 0653 02/28/22 0433  WBC 4.8   < > 6.3 4.8 4.9 5.8  NEUTROABS 3.6  --  5.0  --   --   --   HGB 10.8*   < > 10.3* 9.4* 10.0* 9.7*  HCT 33.4*   < > 31.4* 27.7* 29.3* 28.9*  MCV 87.0   < > 86.5 84.2 85.2 85.5  PLT 249   < > 250 237 234 226   < > = values in this interval not displayed.   Cardiac Enzymes: No results for input(s): "CKTOTAL", "CKMB", "CKMBINDEX", "TROPONINI" in the last 8760 hours. BNP: Invalid input(s): "POCBNP" No results found for: "HGBA1C" Lab Results  Component Value Date   TSH 8.962 (H) 02/23/2022   Lab Results  Component Value Date   VITAMINB12 4,191 (H) 02/25/2022   No results found for: "FOLATE" Lab Results  Component Value Date   IRON 30 (L) 02/24/2022   TIBC 343 02/24/2022   FERRITIN 14 (L) 02/24/2022    Imaging and Procedures obtained prior to SNF admission: ECHOCARDIOGRAM COMPLETE  Result Date: 02/24/2022    ECHOCARDIOGRAM REPORT   Patient Name:   EVERETT EHRLER Date of Exam: 02/24/2022 Medical Rec #:  [id number]          Height:       69.0 in Accession #:    [id number]         Weight:       188.0 lb Date of Birth:  1926/04/26          BSA:          2.012 m Patient Age:    88 years           BP:           123/69 mmHg Patient Gender: M                  HR:            58 bpm. Exam Location:  [facility] Procedure: 2D Echo, Color Doppler and Cardiac Doppler Indications:     CHF  I50.31  History:         Patient has no prior history of Echocardiogram examinations.  Sonographer:     Kathlen Brunswick RDCS Referring Phys:  Unknown Foley NIU Diagnosing Phys: Neoma Laming  Sonographer Comments: Technically difficult study due to poor echo windows. Image acquisition challenging due to uncooperative patient. IMPRESSIONS  1. Left ventricular ejection fraction, by estimation, is 30 to 35%. The left ventricle has moderately decreased function. The left ventricle demonstrates global hypokinesis. The left ventricular internal cavity size was moderately dilated. There is mild  concentric left ventricular hypertrophy. Left ventricular diastolic parameters are consistent with Grade II diastolic dysfunction (pseudonormalization).  2. Right ventricular systolic function is moderately reduced. The right ventricular size is moderately enlarged.  3. Left atrial size was severely dilated.  4. Right atrial size was severely dilated.  5. The mitral valve is normal in structure. Moderate mitral valve regurgitation. No evidence of mitral stenosis.  6. Tricuspid valve regurgitation is moderate.  7. The aortic valve is calcified. Aortic valve regurgitation is mild. Aortic valve sclerosis/calcification is present, without any evidence of aortic stenosis.  8. The inferior vena cava is normal in size with greater than 50% respiratory variability, suggesting right atrial pressure of 3 mmHg. Conclusion(s)/Recommendation(s): Findings consistent with dilated cardiomyopathy. Unable to exclude left ventricular thrombus, would recommend a repeat transthoracic echocardiogram with contrast. Cannot r/o LV apical thrombus. FINDINGS  Left Ventricle: Left ventricular ejection fraction, by estimation, is 30 to 35%. The left ventricle has moderately decreased function. The left ventricle demonstrates global hypokinesis. The  left ventricular internal cavity size was moderately dilated. There is mild concentric left ventricular hypertrophy. Left ventricular diastolic parameters are consistent with Grade II diastolic dysfunction (pseudonormalization). Right Ventricle: The right ventricular size is moderately enlarged. No increase in right ventricular wall thickness. Right ventricular systolic function is moderately reduced. Left Atrium: Left atrial size was severely dilated. Right Atrium: Right atrial size was severely dilated. Pericardium: There is no evidence of pericardial effusion. Mitral Valve: The mitral valve is normal in structure. Moderate mitral valve regurgitation. No evidence of mitral valve stenosis. Tricuspid Valve: The tricuspid valve is normal in structure. Tricuspid valve regurgitation is moderate . No evidence of tricuspid stenosis. Aortic Valve: The aortic valve is calcified. Aortic valve regurgitation is mild. Aortic valve sclerosis/calcification is present, without any evidence of aortic stenosis. Aortic valve peak gradient measures 7.7 mmHg. Pulmonic Valve: The pulmonic valve was normal in structure. Pulmonic valve regurgitation is not visualized. No evidence of pulmonic stenosis. Aorta: The aortic root is normal in size and structure. Venous: The inferior vena cava is normal in size with greater than 50% respiratory variability, suggesting right atrial pressure of 3 mmHg. IAS/Shunts: No atrial level shunt detected by color flow Doppler.  LEFT VENTRICLE PLAX 2D LVIDd:         5.10 cm      Diastology LVIDs:         4.50 cm      LV e' medial:    4.68 cm/s LV PW:         1.20 cm      LV E/e' medial:  19.6 LV IVS:        1.00 cm      LV e' lateral:   18.10 cm/s LVOT diam:     2.40 cm      LV E/e' lateral: 5.1 LV SV:         74 LV SV Index:   37 LVOT Area:  4.52 cm  LV Volumes (MOD) LV vol d, MOD A4C: 134.0 ml LV vol s, MOD A4C: 90.6 ml LV SV MOD A4C:     134.0 ml LEFT ATRIUM            Index LA diam:      4.40 cm   2.19 cm/m LA Vol (A4C): 109.0 ml 54.16 ml/m  AORTIC VALVE AV Area (Vmax): 3.22 cm AV Vmax:        139.00 cm/s AV Peak Grad:   7.7 mmHg LVOT Vmax:      98.97 cm/s LVOT Vmean:     64.100 cm/s LVOT VTI:       0.163 m  AORTA Ao Root diam: 4.10 cm Ao Asc diam:  3.50 cm MITRAL VALVE               TRICUSPID VALVE MV Area (PHT): 3.06 cm    TV Peak grad:   34.0 mmHg MV Decel Time: 248 msec    TV Vmax:        2.92 m/s MV E velocity: 91.70 cm/s  TR Peak grad:   41.2 mmHg MV A velocity: 34.60 cm/s  TR Vmax:        321.00 cm/s MV E/A ratio:  2.65                            SHUNTS                            Systemic VTI:  0.16 m                            Systemic Diam: 2.40 cm Neoma Laming Electronically signed by Neoma Laming Signature Date/Time: 02/24/2022/5:51:28 PM    Final     Assessment/Plan 1. Acute on chronic systolic CHF (congestive heart failure) ([place])  Delirium Patient with recent hospitalization for CHF exacerbation. He was volume overloaded. He appears euvolemic on exam with no leg swelling, clear lungs, and no oxygen requirements. Patient states he had moments feeling like he was not in the hospital. He is back to his baseline cognition alert and oriented to self. Discussed importance of maintaining a normal sleep-wake cycle. Will continue lisinopril 45m for now, however, patient's blood pressure is quite low on this regimen. If BP dips below 1433systolic will have to discontinue. Continue torsemide 20 mg daily.   2. Paroxysmal atrial fibrillation ([place] 4. Sick sinus syndrome ([place] Patient's pace maker maintains a regular pace. On eliquis 2.5 mg BID. Continue at this time.   3. Moderate episode of recurrent major depressive disorder ([facility] Patient states this illness led to worsening of his depressive symptoms. Sertraline decreased to 25 mg in the hospital due to hyponatremia. Based on patient's goals of improved mood for quality of life will return to 567mtablet. BMP next week. If persistently low  will consider NACL supplmentation.   5. Abdominal aortic aneurysm (AAA) without rupture, unspecified part ([place] well-controlled. Continue blood thinners at this time.  6. Stage 3a chronic kidney disease ([place] recent eGFR 43. Will continue to monitor kidney function with q3 mo labs. BMP in 1 week.   7. Primary hypertension BP well-controlled on current medication. Will consider discontinuing lisinopril if BP systolics less than 10295  8. Hypothyroidism, unspecified type TSH 8.92 based on age, this is reasonable level. Patient  currently on levothyroxine 112 mcg, will continue  9. Hyponatremia  hypokalemia  Likely abnormal secondary to medications. Will reassess after rechecking electrolytes on BMP.   10. Full incontinence of feces Continue briefs   11. Do not resuscitate Patient maintains he does not want CPR and does not want to return to the hospital. If he has decompensation, will transition patient to comfort care.      Family/ staff Communication: Hulan Fess and Nursing  Labs/tests ordered:  Tomasa Rand, MD, [facility] [phone]

## 2022-03-04 NOTE — Progress Notes (Deleted)
   Patient ID: Frank Bartlett, male    DOB: [birth year], 88 y.o.   MRN: [id number]  HPI  Frank Bartlett is a 88 y/o male with a history of  Echo report from 02/24/22 reviewed and showed an EF of 30-35% along with mild LVH, severe LAE/RAE, mild AR and moderate Frank/TR.   Admitted 02/23/22 due to leg edema and abnormal labs. CXR showed pulmonary edemal and BNP elevated. Initially given IV lasix. Patient refused meds and wanted comfort care. Then started feeling better and wanted treatment. Palliative care consult obtained. Restarted diuretics. Hyponatremia likely due to SIADH. OT/ speech/ PT evaluations done. Discharged after 6 days.   He presents today for her initial visit with a chief complaint of   Review of Systems    Physical Exam    Assessment & Plan:  1: Chronic heart failure with reduced ejection fraction- - NYHA class - on GDMT of  - BNP 02/25/22 was 1301.2  2: HTN with CKD- - BP - currently seeing PCP at SNF - BMP 03/01/22 reviewed and showed sodium 129, potassium 3.3, creatinine 1.49 and GFR 43  3: Hyponatremia- - most recent sodium 129  4: PAF- - saw cardiology (Frank Bartlett) 10/18/21

## 2022-03-06 ENCOUNTER — Telehealth: Payer: Self-pay | Admitting: Family

## 2022-03-06 ENCOUNTER — Ambulatory Visit: Payer: Medicare Other | Admitting: Family

## 2022-03-06 NOTE — Telephone Encounter (Signed)
Patient did not show for his initial Heart Failure Clinic appointment on 03/06/22. Will attempt to reschedule.

## 2022-03-12 DIAGNOSIS — M545 Low back pain, unspecified: Secondary | ICD-10-CM | POA: Diagnosis not present

## 2022-03-12 DIAGNOSIS — K5909 Other constipation: Secondary | ICD-10-CM | POA: Diagnosis not present

## 2022-03-12 DIAGNOSIS — R109 Unspecified abdominal pain: Secondary | ICD-10-CM | POA: Diagnosis not present

## 2022-03-15 ENCOUNTER — Ambulatory Visit: Payer: Medicare Other | Admitting: Dermatology

## 2022-03-19 ENCOUNTER — Encounter: Payer: Self-pay | Admitting: Student

## 2022-03-19 ENCOUNTER — Non-Acute Institutional Stay (SKILLED_NURSING_FACILITY): Payer: Medicare Other | Admitting: Student

## 2022-03-19 DIAGNOSIS — R634 Abnormal weight loss: Secondary | ICD-10-CM

## 2022-03-19 DIAGNOSIS — L89626 Pressure-induced deep tissue damage of left heel: Secondary | ICD-10-CM

## 2022-03-19 DIAGNOSIS — L98491 Non-pressure chronic ulcer of skin of other sites limited to breakdown of skin: Secondary | ICD-10-CM

## 2022-03-19 DIAGNOSIS — E46 Unspecified protein-calorie malnutrition: Secondary | ICD-10-CM | POA: Diagnosis not present

## 2022-03-19 NOTE — Progress Notes (Unsigned)
Location:  Other [place] Room Number: [place] of Service:  SNF [phone]) Provider:  Dr. Amada Kingfisher, MD  Patient Care Team: Dewayne Shorter, MD as PCP - General (Family Medicine) Lauree Chandler, NP as Nurse Practitioner (Geriatric Medicine)  Extended Emergency Contact Information Primary Emergency Contact: [facility] Address: [place] [address] [name] of [place] Phone: [phone] Mobile Phone: [phone] Relation: Son Secondary Emergency Contact: Leanora Ivanoff States of [place] Phone: [phone] Mobile Phone: [phone] Relation: Son  Code Status:  DNR Goals of care: Advanced Directive information    03/19/2022   11:31 AM  Advanced Directives  Does Patient Have a Medical Advance Directive? Yes  Type of Advance Directive Out of facility DNR (pink MOST or yellow form)  Does patient want to make changes to medical advance directive? No - Patient declined     Chief Complaint  Patient presents with   Acute Visit    Foot Ulcer    HPI:  Pt is a 88 y.o. male seen today for an acute visit for pressure wounds of the bilateral heels.   Denies chest pain. He huffs and puffs a bit when he walks, but that it is not new. Therapy is going well. He is looking forward to getting back to his apartment when it is time.   Denies fevers, chills, nausea, vomiting.   He has lost weight since being here in the facility. He says he hasn't had much of an appetite. He does not get protein supplementation, but he is open to taking them if they are available.    Past Medical History:  Diagnosis Date   AAA (abdominal aortic aneurysm) ([place])    Actinic keratosis 07/22/2017   left lateral crown, midline crown, right of midline crown   Aortic atherosclerosis (H[facility])    Atrial fibrillation ([place]) 03/2016   brief spell   B12 deficiency    Basal cell carcinoma 10/30/2016   L lat  crown   Bradycardia    Bradycardia 02/2017   Pacer placed   Chronic kidney disease (CKD), stage III (moderate) (HCC)    Hyperlipidemia    Hypertension    Hypothyroidism    Osteoarthritis    Prostate cancer ([place]) 2001   Rad tx's + seed implants   Renal cancer, left ([place]) 03/2016   Left Renal Nephrectomy   Sick sinus syndrome ([place])    Pacemaker   Past Surgical History:  Procedure Laterality Date   CATARACT EXTRACTION, BILATERAL     COLONOSCOPY     EXCISIONAL HEMORRHOIDECTOMY     INSERTION PROSTATE RADIATION SEED     LAPAROSCOPIC NEPHRECTOMY, HAND ASSISTED Left 03/08/2016   Procedure: HAND ASSISTED LAPAROSCOPIC NEPHRECTOMY;  Surgeon: Nickie Retort, MD;  Location: [facility] ORS;  Service: Urology;  Laterality: Left;   LAPAROTOMY N/A 03/13/2016   Procedure: EXPLORATORY LAPAROTOMY;  Surgeon: Festus Aloe, MD;  Location: [facility] ORS;  Service: Urology;  Laterality: N/A;   PACEMAKER INSERTION N/A 02/20/2017   Procedure: INSERTION PACEMAKER;  Surgeon: Isaias Cowman, MD;  Location: [facility] ORS;  Service: Cardiovascular;  Laterality: N/A;    No Known Allergies  Outpatient Encounter Medications as of 03/19/2022  Medication Sig   acetaminophen (TYLENOL) 650 MG CR tablet Take 650 mg by mouth every 4 (four) hours as needed for pain.   Bismuth Subsalicylate (KAOPECTATE PO) Take 10 mLs by mouth daily as needed. For diarrhea  carbamide peroxide (DEBROX) 6.5 % OTIC solution Place 5 drops into both ears as needed.   Cyanocobalamin 1000 MCG TBCR Take 1,000 mcg by mouth daily.    dapagliflozin propanediol (FARXIGA) 5 MG TABS tablet Take 1 tablet (5 mg total) by mouth daily.   dextromethorphan-guaiFENesin (ROBITUSSIN-DM) 10-100 MG/5ML liquid Take 10 mLs by mouth every 4 (four) hours as needed for cough.   ELIQUIS 2.5 MG TABS tablet Take 2.5 mg by mouth 2 (two) times daily.   ferrous sulfate 325 (65 FE) MG tablet Take 1 tablet (325 mg total) by mouth daily with breakfast.   levothyroxine  (SYNTHROID, LEVOTHROID) 112 MCG tablet Take 112 mcg by mouth daily before breakfast.   magnesium hydroxide (MILK OF MAGNESIA) 400 MG/5ML suspension Take 30 mLs by mouth daily as needed for mild constipation.   Multiple Vitamin (MULTI-VITAMINS) TABS Take 1 tablet by mouth daily.   mupirocin ointment (BACTROBAN) 2 % 1 Application daily. Apply to scalp daily   nystatin (MYCOSTATIN/NYSTOP) powder Apply 1 Application topically as needed.   ondansetron (ZOFRAN) 4 MG tablet Take 4 mg by mouth as needed for nausea or vomiting. Up to three times a day   sertraline (ZOLOFT) 50 MG tablet Take 50 mg by mouth daily.   torsemide (DEMADEX) 20 MG tablet Take 1 tablet (20 mg total) by mouth daily.   witch hazel-glycerin (TUCKS) pad Apply topically as needed for itching.   Zinc Oxide (DESITIN) 13 % CREA Apply topically. Apply to rectum topically as needed   [DISCONTINUED] acetaminophen (TYLENOL) 325 MG tablet Take 650 mg by mouth every 4 (four) hours as needed.   [DISCONTINUED] lisinopril (ZESTRIL) 5 MG tablet Take 1 tablet (5 mg total) by mouth daily.   [DISCONTINUED] sertraline (ZOLOFT) 25 MG tablet Take 1 tablet (25 mg total) by mouth daily. (Patient taking differently: Take 50 mg by mouth daily.)   No facility-administered encounter medications on file as of 03/19/2022.    Review of Systems  All other systems reviewed and are negative.   Immunization History  Administered Date(s) Administered   Influenza, High Dose Seasonal PF 01/25/2017, 03/04/2018, 02/21/2022   Influenza-Unspecified 03/14/2015, 02/21/2021   Moderna Sars-Covid-2 Vaccination 05/18/2019, 06/15/2019, 03/16/2022   Pneumococcal Conjugate-13 07/09/2014, 12/05/2015   Pneumococcal Polysaccharide-23 12/04/2016   Tdap 04/12/2018   Unspecified SARS-COV-2 Vaccination 10/03/2021   Pertinent  Health Maintenance Due  Topic Date Due   INFLUENZA VACCINE  Completed      02/26/2022    9:00 PM 02/27/2022    7:46 PM 02/28/2022    2:00 PM  02/28/2022    7:53 PM 03/01/2022    8:30 AM  Fall Risk  Patient Fall Risk Level High fall risk High fall risk High fall risk High fall risk High fall risk   Functional Status Survey:    Vitals:   03/19/22 1119  BP: 128/78  Pulse: 77  Resp: 18  Temp: (!) 97 F (36.1 C)  SpO2: 95%  Weight: 148 lb 9.6 oz (67.4 kg)  Height: '5\' 9"'$  (1.753 m)   Body mass index is 21.94 kg/m. Physical Exam Constitutional:      Appearance: Normal appearance.  Cardiovascular:     Rate and Rhythm: Normal rate.     Pulses: Normal pulses.  Pulmonary:     Effort: Pulmonary effort is normal.     Breath sounds: Normal breath sounds.  Abdominal:     General: Bowel sounds are normal.     Palpations: Abdomen is soft.  Skin:  Comments: Bilateral feet with wounds, photo below  Neurological:     Mental Status: He is alert and oriented to person, place, and time.          Labs reviewed: Recent Labs    02/23/22 0715 02/23/22 1359 02/25/22 0542 02/26/22 0653 02/28/22 0433 03/01/22 0625  NA 126*   < > 129* 128* 127* 129*  K 4.1   < > 3.5 3.3* 3.7 3.3*  CL 96*   < > 99 94* 91* 90*  CO2 21*   < > '23 24 26 26  '$ GLUCOSE 101*   < > 109* 102* 100* 99  BUN 22   < > 20 18 28* 35*  CREATININE 1.15   < > 1.20 1.30* 1.37* 1.49*  CALCIUM 8.9   < > 8.2* 8.3* 8.0* 8.3*  MG 2.0   < > 1.9 1.8 1.8  --   PHOS 3.4  --   --   --   --   --    < > = values in this interval not displayed.   Recent Labs    02/23/22 0715  AST 31  ALT 29  ALKPHOS 90  BILITOT 0.9  PROT 7.2  ALBUMIN 3.7   Recent Labs    02/23/22 0715 02/23/22 1359 02/24/22 0829 02/25/22 0542 02/26/22 0653 02/28/22 0433  WBC 4.8   < > 6.3 4.8 4.9 5.8  NEUTROABS 3.6  --  5.0  --   --   --   HGB 10.8*   < > 10.3* 9.4* 10.0* 9.7*  HCT 33.4*   < > 31.4* 27.7* 29.3* 28.9*  MCV 87.0   < > 86.5 84.2 85.2 85.5  PLT 249   < > 250 237 234 226   < > = values in this interval not displayed.   Lab Results  Component Value Date   TSH 8.962  (H) 02/23/2022   No results found for: "HGBA1C" No results found for: "CHOL", "HDL", "LDLCALC", "LDLDIRECT", "TRIG", "CHOLHDL"  Significant Diagnostic Results in last 30 days:  ECHOCARDIOGRAM COMPLETE  Result Date: 02/24/2022    ECHOCARDIOGRAM REPORT   Patient Name:   MANUAL NAVARRA Date of Exam: 02/24/2022 Medical Rec #:  [id number]          Height:       69.0 in Accession #:    [id number]         Weight:       188.0 lb Date of Birth:  [birth year]          BSA:          2.012 m Patient Age:    81 years           BP:           123/69 mmHg Patient Gender: M                  HR:           58 bpm. Exam Location:  [facility] Procedure: 2D Echo, Color Doppler and Cardiac Doppler Indications:     CHF I50.31  History:         Patient has no prior history of Echocardiogram examinations.  Sonographer:     Kathlen Brunswick RDCS Referring Phys:  Unknown Foley NIU Diagnosing Phys: Neoma Laming  Sonographer Comments: Technically difficult study due to poor echo windows. Image acquisition challenging due to uncooperative patient. IMPRESSIONS  1. Left ventricular ejection fraction, by estimation, is 30 to 35%. The left  ventricle has moderately decreased function. The left ventricle demonstrates global hypokinesis. The left ventricular internal cavity size was moderately dilated. There is mild  concentric left ventricular hypertrophy. Left ventricular diastolic parameters are consistent with Grade II diastolic dysfunction (pseudonormalization).  2. Right ventricular systolic function is moderately reduced. The right ventricular size is moderately enlarged.  3. Left atrial size was severely dilated.  4. Right atrial size was severely dilated.  5. The mitral valve is normal in structure. Moderate mitral valve regurgitation. No evidence of mitral stenosis.  6. Tricuspid valve regurgitation is moderate.  7. The aortic valve is calcified. Aortic valve regurgitation is mild. Aortic valve sclerosis/calcification is present, without any  evidence of aortic stenosis.  8. The inferior vena cava is normal in size with greater than 50% respiratory variability, suggesting right atrial pressure of 3 mmHg. Conclusion(s)/Recommendation(s): Findings consistent with dilated cardiomyopathy. Unable to exclude left ventricular thrombus, would recommend a repeat transthoracic echocardiogram with contrast. Cannot r/o LV apical thrombus. FINDINGS  Left Ventricle: Left ventricular ejection fraction, by estimation, is 30 to 35%. The left ventricle has moderately decreased function. The left ventricle demonstrates global hypokinesis. The left ventricular internal cavity size was moderately dilated. There is mild concentric left ventricular hypertrophy. Left ventricular diastolic parameters are consistent with Grade II diastolic dysfunction (pseudonormalization). Right Ventricle: The right ventricular size is moderately enlarged. No increase in right ventricular wall thickness. Right ventricular systolic function is moderately reduced. Left Atrium: Left atrial size was severely dilated. Right Atrium: Right atrial size was severely dilated. Pericardium: There is no evidence of pericardial effusion. Mitral Valve: The mitral valve is normal in structure. Moderate mitral valve regurgitation. No evidence of mitral valve stenosis. Tricuspid Valve: The tricuspid valve is normal in structure. Tricuspid valve regurgitation is moderate . No evidence of tricuspid stenosis. Aortic Valve: The aortic valve is calcified. Aortic valve regurgitation is mild. Aortic valve sclerosis/calcification is present, without any evidence of aortic stenosis. Aortic valve peak gradient measures 7.7 mmHg. Pulmonic Valve: The pulmonic valve was normal in structure. Pulmonic valve regurgitation is not visualized. No evidence of pulmonic stenosis. Aorta: The aortic root is normal in size and structure. Venous: The inferior vena cava is normal in size with greater than 50% respiratory variability,  suggesting right atrial pressure of 3 mmHg. IAS/Shunts: No atrial level shunt detected by color flow Doppler.  LEFT VENTRICLE PLAX 2D LVIDd:         5.10 cm      Diastology LVIDs:         4.50 cm      LV e' medial:    4.68 cm/s LV PW:         1.20 cm      LV E/e' medial:  19.6 LV IVS:        1.00 cm      LV e' lateral:   18.10 cm/s LVOT diam:     2.40 cm      LV E/e' lateral: 5.1 LV SV:         74 LV SV Index:   37 LVOT Area:     4.52 cm  LV Volumes (MOD) LV vol d, MOD A4C: 134.0 ml LV vol s, MOD A4C: 90.6 ml LV SV MOD A4C:     134.0 ml LEFT ATRIUM            Index LA diam:      4.40 cm  2.19 cm/m LA Vol (A4C): 109.0 ml 54.16 ml/m  AORTIC VALVE  AV Area (Vmax): 3.22 cm AV Vmax:        139.00 cm/s AV Peak Grad:   7.7 mmHg LVOT Vmax:      98.97 cm/s LVOT Vmean:     64.100 cm/s LVOT VTI:       0.163 m  AORTA Ao Root diam: 4.10 cm Ao Asc diam:  3.50 cm MITRAL VALVE               TRICUSPID VALVE MV Area (PHT): 3.06 cm    TV Peak grad:   34.0 mmHg MV Decel Time: 248 msec    TV Vmax:        2.92 m/s MV E velocity: 91.70 cm/s  TR Peak grad:   41.2 mmHg MV A velocity: 34.60 cm/s  TR Vmax:        321.00 cm/s MV E/A ratio:  2.65                            SHUNTS                            Systemic VTI:  0.16 m                            Systemic Diam: 2.40 cm [employer] Electronically signed by Neoma Laming Signature Date/Time: 02/24/2022/5:51:28 PM    Final    CT HEAD WO CONTRAST (5MM)  Result Date: 02/23/2022 CLINICAL DATA:  Head trauma, minor (Age >= 65y) EXAM: CT HEAD WITHOUT CONTRAST TECHNIQUE: Contiguous axial images were obtained from the base of the skull through the vertex without intravenous contrast. RADIATION DOSE REDUCTION: This exam was performed according to the departmental dose-optimization program which includes automated exposure control, adjustment of the mA and/or kV according to patient size and/or use of iterative reconstruction technique. COMPARISON:  CT head August 05, 2017. FINDINGS: Brain:  No evidence of acute infarction, hemorrhage, hydrocephalus, extra-axial collection or mass lesion/mass effect. Mild for age patchy white matter hypodensities, nonspecific but compatible with chronic ischemic disease. Vascular: No hyperdense vessel identified. Skull: No acute fracture. Sinuses/Orbits: Partial right sphenoid sinus opacification with scattered paranasal sinus mucosal thickening. No acute orbital findings. Other: No mastoid effusions. IMPRESSION: No evidence of acute intracranial abnormality. Electronically Signed   By: Margaretha Sheffield M.D.   On: 02/23/2022 11:39   DG Chest 2 View  Result Date: 02/23/2022 CLINICAL DATA:  88 year old male with history of edema. EXAM: CHEST - 2 VIEW COMPARISON:  Chest x-ray 04/25/2020. FINDINGS: There is cephalization of the pulmonary vasculature and slight indistinctness of the interstitial markings suggestive of mild pulmonary edema. Small bilateral pleural effusions. Bibasilar opacities which may reflect areas of atelectasis and/or consolidation. Mild cardiomegaly. Upper mediastinal contours are within normal limits allowing for mild patient rotation. Atherosclerotic calcifications are noted in the thoracic aorta. Left pacemaker with lead tips projecting over the expected location of the right atrium and right ventricle. IMPRESSION: 1. The appearance of the chest suggests mild congestive heart failure. 2. Bibasilar opacities favored to reflect areas of subsegmental atelectasis, although areas of airspace consolidation are not excluded. 3. Aortic atherosclerosis. Electronically Signed   By: Vinnie Langton M.D.   On: 02/23/2022 07:43    Assessment/Plan 1. Pressure injury of deep tissue of left heel 2. Stage 2 skin ulcer limited to breakdown of skin (HCC) Bilateral heels with pressure injuries. Discussed  concern for patient's nutrition given he is ambulatory. He states he would like to return to his apartment in ALF if he continues to progress with therapy,  however, with pressure wounds, unclear he will be accepted back to ALF. Continue calcium alginate and mepilex on right heel. Continue mepilex on left heal. Do not debride given DTI.   3. Protein deficiency ([place]) 4. Weight loss Encourage full, high calorie diet. Protein supplementation with each meal. Consider starting appetite stimulating medication.     Family/ staff Communication: nursing  Labs/tests ordered:  none.  Tomasa Rand, MD, [facility] [phone]

## 2022-03-21 DIAGNOSIS — L98499 Non-pressure chronic ulcer of skin of other sites with unspecified severity: Secondary | ICD-10-CM | POA: Insufficient documentation

## 2022-03-21 DIAGNOSIS — L89626 Pressure-induced deep tissue damage of left heel: Secondary | ICD-10-CM | POA: Insufficient documentation

## 2022-03-21 DIAGNOSIS — R634 Abnormal weight loss: Secondary | ICD-10-CM | POA: Insufficient documentation

## 2022-03-21 DIAGNOSIS — E46 Unspecified protein-calorie malnutrition: Secondary | ICD-10-CM | POA: Insufficient documentation

## 2022-03-26 DIAGNOSIS — R278 Other lack of coordination: Secondary | ICD-10-CM | POA: Diagnosis not present

## 2022-03-26 DIAGNOSIS — I495 Sick sinus syndrome: Secondary | ICD-10-CM | POA: Diagnosis not present

## 2022-03-26 DIAGNOSIS — R262 Difficulty in walking, not elsewhere classified: Secondary | ICD-10-CM | POA: Diagnosis not present

## 2022-03-26 DIAGNOSIS — F32A Depression, unspecified: Secondary | ICD-10-CM | POA: Diagnosis not present

## 2022-03-26 DIAGNOSIS — M6281 Muscle weakness (generalized): Secondary | ICD-10-CM | POA: Diagnosis not present

## 2022-03-26 DIAGNOSIS — E039 Hypothyroidism, unspecified: Secondary | ICD-10-CM | POA: Diagnosis not present

## 2022-03-26 DIAGNOSIS — R2681 Unsteadiness on feet: Secondary | ICD-10-CM | POA: Diagnosis not present

## 2022-03-26 DIAGNOSIS — I5023 Acute on chronic systolic (congestive) heart failure: Secondary | ICD-10-CM | POA: Diagnosis not present

## 2022-03-26 DIAGNOSIS — E871 Hypo-osmolality and hyponatremia: Secondary | ICD-10-CM | POA: Diagnosis not present

## 2022-03-26 DIAGNOSIS — Z741 Need for assistance with personal care: Secondary | ICD-10-CM | POA: Diagnosis not present

## 2022-03-26 DIAGNOSIS — I110 Hypertensive heart disease with heart failure: Secondary | ICD-10-CM | POA: Diagnosis not present

## 2022-03-26 DIAGNOSIS — D649 Anemia, unspecified: Secondary | ICD-10-CM | POA: Diagnosis not present

## 2022-03-26 DIAGNOSIS — N1831 Chronic kidney disease, stage 3a: Secondary | ICD-10-CM | POA: Diagnosis not present

## 2022-03-26 DIAGNOSIS — Z950 Presence of cardiac pacemaker: Secondary | ICD-10-CM | POA: Diagnosis not present

## 2022-03-26 DIAGNOSIS — I480 Paroxysmal atrial fibrillation: Secondary | ICD-10-CM | POA: Diagnosis not present

## 2022-03-26 DIAGNOSIS — I130 Hypertensive heart and chronic kidney disease with heart failure and stage 1 through stage 4 chronic kidney disease, or unspecified chronic kidney disease: Secondary | ICD-10-CM | POA: Diagnosis not present

## 2022-03-26 LAB — HEPATIC FUNCTION PANEL
ALT: 12 U/L (ref 10–40)
AST: 12 — AB (ref 14–40)
Alkaline Phosphatase: 74 (ref 25–125)
Bilirubin, Total: 0.5

## 2022-03-26 LAB — COMPREHENSIVE METABOLIC PANEL
Albumin: 6 — AB (ref 3.5–5.0)
Calcium: 8.7 (ref 8.7–10.7)
Globulin: 2.5

## 2022-03-26 LAB — BASIC METABOLIC PANEL
BUN: 39 — AB (ref 4–21)
CO2: 23 — AB (ref 13–22)
Chloride: 108 (ref 99–108)
Creatinine: 1.4 — AB (ref 0.6–1.3)
Glucose: 80
Potassium: 4.5 mEq/L (ref 3.5–5.1)
Sodium: 140 (ref 137–147)

## 2022-03-26 LAB — CBC: RBC: 4.18 (ref 3.87–5.11)

## 2022-03-26 LAB — CBC AND DIFFERENTIAL
HCT: 37 — AB (ref 41–53)
Hemoglobin: 12.5 — AB (ref 13.5–17.5)
Neutrophils Absolute: 3023
Platelets: 203 10*3/uL (ref 150–400)
WBC: 4.9

## 2022-03-26 NOTE — Progress Notes (Unsigned)
Patient ID: Frank Bartlett, male    DOB: March 15, 1926, 88 y.o.   MRN: [id number]  HPI  Frank Bartlett is a 88 y/o male with a history of AAA, prostate/ renal cancer, hyperlipidemia, HTN, CKD, thyroid disease, anemia, PAF, depression, hyponatremia, SSS with pacemaker and chronic heart failure.   Echo report from 02/24/22 reviewed and showed an EF of 30-35% along with mild LVH, severe LAE/RAE, mild AR and moderate Frank/TR.   Admitted 02/23/22 due to leg edema and abnormal labs. CXR showed pulmonary edemal and BNP elevated. Initially given IV lasix. Patient refused meds and wanted comfort care. Then started feeling better and wanted treatment. Palliative care consult obtained. Restarted diuretics. Hyponatremia likely due to SIADH. OT/ speech/ PT evaluations done. Discharged after 6 days.   He presents today for her initial visit with a chief complaint of minimal fatigue with moderate exertion. Describes this as chronic in nature. He has associated decreased appetite, shortness of breath, intermittent dizziness and wounds on the bottom of both heels along with this. He denies any abdominal distention, palpitations, pedal edema, chest pain, cough or weight gain.   Past Medical History:  Diagnosis Date   AAA (abdominal aortic aneurysm) (Frank Bartlett)    Actinic keratosis 07/22/2017   left lateral crown, midline crown, right of midline crown   Anemia    Aortic atherosclerosis (HCC)    Atrial fibrillation (Frank Bartlett) 03/2016   brief spell   B12 deficiency    Basal cell carcinoma 10/30/2016   L lat crown   Bradycardia    Bradycardia 02/2017   Pacer placed   CHF (congestive heart failure) (HCC)    Chronic kidney disease (CKD), stage III (moderate) (HCC)    Depression    Hyperlipidemia    Hypertension    Hyponatremia    Hypothyroidism    Osteoarthritis    Prostate cancer (Frank Bartlett) 2001   Rad tx's + seed implants   Renal cancer, left (Frank Bartlett) 03/2016   Left Renal Nephrectomy   Sick sinus syndrome (Frank Bartlett)     Pacemaker   Past Surgical History:  Procedure Laterality Date   CATARACT EXTRACTION, BILATERAL     COLONOSCOPY     EXCISIONAL HEMORRHOIDECTOMY     INSERTION PROSTATE RADIATION SEED     LAPAROSCOPIC NEPHRECTOMY, HAND ASSISTED Left 03/08/2016   Procedure: HAND ASSISTED LAPAROSCOPIC NEPHRECTOMY;  Surgeon: Nickie Retort, MD;  Location: [facility];  Service: Urology;  Laterality: Left;   LAPAROTOMY N/A 03/13/2016   Procedure: EXPLORATORY LAPAROTOMY;  Surgeon: Festus Aloe, MD;  Location: [facility];  Service: Urology;  Laterality: N/A;   PACEMAKER INSERTION N/A 02/20/2017   Procedure: INSERTION PACEMAKER;  Surgeon: Isaias Cowman, MD;  Location: [facility];  Service: Cardiovascular;  Laterality: N/A;   Family History  Problem Relation Age of Onset   Hypertension Mother    Breast cancer Mother    Colon cancer Father    Prostate cancer Neg Hx    Bladder Cancer Neg Hx    Kidney cancer Neg Hx    Social History   Tobacco Use   Smoking status: Never   Smokeless tobacco: Never  Substance Use Topics   Alcohol use: No    Alcohol/week: 0.0 standard drinks of alcohol    Comment: rare wine   No Known Allergies Prior to Admission medications   Medication Sig Start Date End Date Taking? Authorizing Provider  acetaminophen (TYLENOL) 650 MG CR tablet Take 650 mg by mouth every 4 (four) hours as needed for pain.  Yes [provider]  Bismuth Subsalicylate (KAOPECTATE PO) Take 10 mLs by mouth daily as needed. For diarrhea   Yes [provider]  carbamide peroxide (DEBROX) 6.5 % OTIC solution Place 5 drops into both ears as needed.   Yes [provider]  Cyanocobalamin 1000 MCG TBCR Take 1,000 mcg by mouth daily.    Yes [provider]  dapagliflozin propanediol (FARXIGA) 5 MG TABS tablet Take 1 tablet (5 mg total) by mouth daily. 03/01/22  Yes Wieting, Richard, MD  dextromethorphan-guaiFENesin (ROBITUSSIN-DM) 10-100 MG/5ML liquid Take 10 mLs by mouth  every 4 (four) hours as needed for cough.   Yes [provider]  ELIQUIS 2.5 MG TABS tablet Take 2.5 mg by mouth 2 (two) times daily. 11/04/20  Yes [provider]  ferrous sulfate 325 (65 FE) MG tablet Take 1 tablet (325 mg total) by mouth daily with breakfast. 03/02/22  Yes Wieting, Richard, MD  levothyroxine (SYNTHROID, LEVOTHROID) 112 MCG tablet Take 112 mcg by mouth daily before breakfast.   Yes [provider]  magnesium hydroxide (MILK OF MAGNESIA) 400 MG/5ML suspension Take 30 mLs by mouth daily as needed for mild constipation.   Yes [provider]  Multiple Vitamin (MULTI-VITAMINS) TABS Take 1 tablet by mouth daily.   Yes [provider]  mupirocin ointment (BACTROBAN) 2 % 1 Application daily. Apply to scalp daily   Yes [provider]  nystatin (MYCOSTATIN/NYSTOP) powder Apply 1 Application topically as needed.   Yes [provider]  ondansetron (ZOFRAN) 4 MG tablet Take 4 mg by mouth as needed for nausea or vomiting. Up to three times a day   Yes [provider]  sertraline (ZOLOFT) 50 MG tablet Take 50 mg by mouth daily.   Yes [provider]  torsemide (DEMADEX) 20 MG tablet Take 20 mg by mouth daily.   Yes [provider]  witch hazel-glycerin (TUCKS) pad Apply topically as needed for itching. 03/01/22  Yes Wieting, Richard, MD  Zinc Oxide (DESITIN) 13 % CREA Apply topically. Apply to rectum topically ever 2 hours  needed   Yes [provider]    Review of Systems  Constitutional:  Positive for appetite change (decreased) and fatigue.  HENT:  Negative for congestion, postnasal drip and sore throat.   Eyes: Negative.   Respiratory:  Positive for shortness of breath. Negative for cough and chest tightness.   Cardiovascular:  Negative for chest pain, palpitations and leg swelling.  Gastrointestinal:  Negative for abdominal distention and abdominal pain.  Endocrine: Negative.    Genitourinary: Negative.   Musculoskeletal:  Positive for arthralgias (feet pain). Negative for back pain.  Skin:  Positive for wound (bottom of both heels).  Allergic/Immunologic: Negative.   Neurological:  Positive for dizziness (at times). Negative for light-headedness.  Hematological:  Negative for adenopathy. Does not bruise/bleed easily.  Psychiatric/Behavioral:  Negative for dysphoric mood and sleep disturbance (sleeping on 2 pillows). The patient is not nervous/anxious.    Vitals:   03/27/22 1256  BP: 96/64  Pulse: 78  Resp: 16  SpO2: 100%  Weight: 153 lb 8 oz (69.6 kg)   Wt Readings from Last 3 Encounters:  03/27/22 153 lb 8 oz (69.6 kg)  03/27/22 150 lb (68 kg)  03/19/22 148 lb 9.6 oz (67.4 kg)   Lab Results  Component Value Date   CREATININE 1.4 (A) 03/26/2022   CREATININE 1.49 (H) 03/01/2022   CREATININE 1.37 (H) 02/28/2022   Physical Exam Vitals and nursing note  reviewed. Exam conducted with a chaperone present (facility staff member).  Constitutional:      Appearance: Normal appearance.  HENT:     Head: Normocephalic and atraumatic.  Cardiovascular:     Rate and Rhythm: Normal rate. Rhythm irregular.  Pulmonary:     Effort: Pulmonary effort is normal. No respiratory distress.     Breath sounds: No wheezing or rales.  Abdominal:     General: There is no distension.     Palpations: Abdomen is soft.     Tenderness: There is no abdominal tenderness.  Musculoskeletal:        General: No tenderness.     Cervical back: Normal range of motion and neck supple.     Right lower leg: Edema (trace pitting) present.     Left lower leg: Edema (trace pitting) present.  Skin:    General: Skin is warm and dry.     Comments: Reports wounds on bottom of both heels  Neurological:     General: No focal deficit present.     Mental Status: He is alert and oriented to person, place, and time.  Psychiatric:        Mood and Affect: Mood normal.        Behavior: Behavior  normal.        Judgment: Judgment normal.   Assessment & Plan:  1: Chronic heart failure with reduced ejection fraction- - NYHA class II - euvolemic today - being weighed daily at [facility]; reminded to call for an overnight weight gain of > 2 pounds or a weekly weight gain of > 5 pounds - not adding salt to his food - currently receiving daily PT - on GDMT of farxiga (low dose) - current BP will not allow for other GDMT - BNP 02/25/22 was 1301.2  2: HTN with CKD- - BP soft at 96/64 - currently seeing PCP at SNF with most recent visit earlier today - BMP 03/26/22 reviewed and showed sodium 140, potassium 4.5, creatinine 1.4  3: Hyponatremia- - resolved; previous sodium had been upper 120's  4: PAF- - saw cardiology ([facility]) 10/18/21 - currently taking apixaban   Facility medication list reviewed.   Return in 2 months, sooner if needed.

## 2022-03-27 ENCOUNTER — Encounter: Payer: Self-pay | Admitting: Family

## 2022-03-27 ENCOUNTER — Non-Acute Institutional Stay (SKILLED_NURSING_FACILITY): Payer: Medicare Other | Admitting: Nurse Practitioner

## 2022-03-27 ENCOUNTER — Ambulatory Visit: Payer: Medicare Other | Attending: Family | Admitting: Family

## 2022-03-27 ENCOUNTER — Encounter: Payer: Self-pay | Admitting: Nurse Practitioner

## 2022-03-27 VITALS — BP 96/64 | HR 78 | Resp 16 | Wt 153.5 lb

## 2022-03-27 DIAGNOSIS — Z8679 Personal history of other diseases of the circulatory system: Secondary | ICD-10-CM | POA: Insufficient documentation

## 2022-03-27 DIAGNOSIS — R278 Other lack of coordination: Secondary | ICD-10-CM | POA: Diagnosis not present

## 2022-03-27 DIAGNOSIS — I5022 Chronic systolic (congestive) heart failure: Secondary | ICD-10-CM | POA: Insufficient documentation

## 2022-03-27 DIAGNOSIS — H6121 Impacted cerumen, right ear: Secondary | ICD-10-CM | POA: Diagnosis not present

## 2022-03-27 DIAGNOSIS — I10 Essential (primary) hypertension: Secondary | ICD-10-CM

## 2022-03-27 DIAGNOSIS — D631 Anemia in chronic kidney disease: Secondary | ICD-10-CM | POA: Insufficient documentation

## 2022-03-27 DIAGNOSIS — N1832 Chronic kidney disease, stage 3b: Secondary | ICD-10-CM

## 2022-03-27 DIAGNOSIS — Z85528 Personal history of other malignant neoplasm of kidney: Secondary | ICD-10-CM | POA: Insufficient documentation

## 2022-03-27 DIAGNOSIS — Z950 Presence of cardiac pacemaker: Secondary | ICD-10-CM | POA: Diagnosis not present

## 2022-03-27 DIAGNOSIS — E46 Unspecified protein-calorie malnutrition: Secondary | ICD-10-CM | POA: Diagnosis not present

## 2022-03-27 DIAGNOSIS — I130 Hypertensive heart and chronic kidney disease with heart failure and stage 1 through stage 4 chronic kidney disease, or unspecified chronic kidney disease: Secondary | ICD-10-CM | POA: Diagnosis not present

## 2022-03-27 DIAGNOSIS — E039 Hypothyroidism, unspecified: Secondary | ICD-10-CM

## 2022-03-27 DIAGNOSIS — I480 Paroxysmal atrial fibrillation: Secondary | ICD-10-CM | POA: Diagnosis not present

## 2022-03-27 DIAGNOSIS — D649 Anemia, unspecified: Secondary | ICD-10-CM | POA: Diagnosis not present

## 2022-03-27 DIAGNOSIS — F331 Major depressive disorder, recurrent, moderate: Secondary | ICD-10-CM

## 2022-03-27 DIAGNOSIS — R262 Difficulty in walking, not elsewhere classified: Secondary | ICD-10-CM | POA: Diagnosis not present

## 2022-03-27 DIAGNOSIS — Z741 Need for assistance with personal care: Secondary | ICD-10-CM | POA: Diagnosis not present

## 2022-03-27 DIAGNOSIS — Z7901 Long term (current) use of anticoagulants: Secondary | ICD-10-CM | POA: Insufficient documentation

## 2022-03-27 DIAGNOSIS — I5023 Acute on chronic systolic (congestive) heart failure: Secondary | ICD-10-CM | POA: Diagnosis not present

## 2022-03-27 DIAGNOSIS — E785 Hyperlipidemia, unspecified: Secondary | ICD-10-CM | POA: Insufficient documentation

## 2022-03-27 DIAGNOSIS — F32A Depression, unspecified: Secondary | ICD-10-CM | POA: Insufficient documentation

## 2022-03-27 DIAGNOSIS — I495 Sick sinus syndrome: Secondary | ICD-10-CM | POA: Diagnosis not present

## 2022-03-27 DIAGNOSIS — M6281 Muscle weakness (generalized): Secondary | ICD-10-CM | POA: Diagnosis not present

## 2022-03-27 DIAGNOSIS — R2681 Unsteadiness on feet: Secondary | ICD-10-CM | POA: Diagnosis not present

## 2022-03-27 DIAGNOSIS — N1831 Chronic kidney disease, stage 3a: Secondary | ICD-10-CM | POA: Diagnosis not present

## 2022-03-27 DIAGNOSIS — I110 Hypertensive heart disease with heart failure: Secondary | ICD-10-CM | POA: Diagnosis not present

## 2022-03-27 DIAGNOSIS — E871 Hypo-osmolality and hyponatremia: Secondary | ICD-10-CM | POA: Insufficient documentation

## 2022-03-27 NOTE — Patient Instructions (Addendum)
Continue weighing daily and call for an overnight weight gain of 3 pounds or more or a weekly weight gain of more than 5 pounds.  °

## 2022-03-27 NOTE — Progress Notes (Signed)
Location:  Other [facility]) Nursing Home Room Number: 208-A Place of Service:  SNF [phone]) Provider:  Carlos American. Dewaine Oats, NP   Patient Care Team: Dewayne Shorter, MD as PCP - General (Family Medicine) Lauree Chandler, NP as Nurse Practitioner (Geriatric Medicine)  Extended Emergency Contact Information Primary Emergency Contact: [facility] Address: [place] [address] [name] of [place] Phone: [phone] Mobile Phone: [phone] Relation: Son Secondary Emergency Contact: Leanora Ivanoff [place] of [place] Phone: [phone] Mobile Phone: [phone] Relation: Son  Code Status:  DNR Goals of care: Advanced Directive information    03/27/2022    9:41 AM  Advanced Directives  Does Patient Have a Medical Advance Directive? Yes  Type of Advance Directive Out of facility DNR (pink MOST or yellow form);Living will  Does patient want to make changes to medical advance directive? No - Patient declined  Pre-existing out of facility DNR order (yellow form or pink MOST form) Yellow form placed in chart (order not valid for inpatient use);Pink MOST form placed in chart (order not valid for inpatient use)     Chief Complaint  Patient presents with   Medical Management of Chronic Issues    Routine visit. Discuss need for AWV and shingrix or post pone if patient refuses or is not a candidate.     HPI:  Pt is a 88 y.o. male seen today for medical management of chronic diseases.  He is currently a long term resident of the skilled facility at [facility], recently moved [place] due to frequent falls and increase in debility.   He has deep tissue injury to bilateral heels- Nursing monitoring and doing routine dressing changes.   Pt following with heart failure clinic after recent hospitalization.  Weight trending up but no increase in swelling or shortness of breath.  Continues on farxiga, demadex   Iron def anemia- on  supplment   Hearing loss noted- worse at this time, had hearing aides  No diarrhea or constipation  A fib- without abnormal bruising or bleeding noted. No palpitations or chest pains.    Past Medical History:  Diagnosis Date   AAA (abdominal aortic aneurysm) ([place])    Actinic keratosis 07/22/2017   left lateral crown, midline crown, right of midline crown   Aortic atherosclerosis (HCC)    Atrial fibrillation ([place]) 03/2016   brief spell   B12 deficiency    Basal cell carcinoma 10/30/2016   L lat crown   Bradycardia    Bradycardia 02/2017   Pacer placed   Chronic kidney disease (CKD), stage III (moderate) (HCC)    Hyperlipidemia    Hypertension    Hypothyroidism    Osteoarthritis    Prostate cancer ([place]) 2001   Rad tx's + seed implants   Renal cancer, left ([place]) 03/2016   Left Renal Nephrectomy   Sick sinus syndrome ([place])    Pacemaker   Past Surgical History:  Procedure Laterality Date   CATARACT EXTRACTION, BILATERAL     COLONOSCOPY     EXCISIONAL HEMORRHOIDECTOMY     INSERTION PROSTATE RADIATION SEED     LAPAROSCOPIC NEPHRECTOMY, HAND ASSISTED Left 03/08/2016   Procedure: HAND ASSISTED LAPAROSCOPIC NEPHRECTOMY;  Surgeon: Nickie Retort, MD;  Location: [facility];  Service: Urology;  Laterality: Left;   LAPAROTOMY N/A 03/13/2016   Procedure: EXPLORATORY LAPAROTOMY;  Surgeon: Festus Aloe, MD;  Location: [facility];  Service: Urology;  Laterality: N/A;  PACEMAKER INSERTION N/A 02/20/2017   Procedure: INSERTION PACEMAKER;  Surgeon: Isaias Cowman, MD;  Location: [facility];  Service: Cardiovascular;  Laterality: N/A;    No Known Allergies  Outpatient Encounter Medications as of 03/27/2022  Medication Sig   acetaminophen (TYLENOL) 650 MG CR tablet Take 650 mg by mouth every 4 (four) hours as needed for pain.   Bismuth Subsalicylate (KAOPECTATE PO) Take 10 mLs by mouth daily as needed. For diarrhea   carbamide peroxide (DEBROX) 6.5 % OTIC solution Place 5 drops  into both ears as needed.   Cyanocobalamin 1000 MCG TBCR Take 1,000 mcg by mouth daily.    dapagliflozin propanediol (FARXIGA) 5 MG TABS tablet Take 1 tablet (5 mg total) by mouth daily.   dextromethorphan-guaiFENesin (ROBITUSSIN-DM) 10-100 MG/5ML liquid Take 10 mLs by mouth every 4 (four) hours as needed for cough.   ELIQUIS 2.5 MG TABS tablet Take 2.5 mg by mouth 2 (two) times daily.   ferrous sulfate 325 (65 FE) MG tablet Take 1 tablet (325 mg total) by mouth daily with breakfast.   levothyroxine (SYNTHROID, LEVOTHROID) 112 MCG tablet Take 112 mcg by mouth daily before breakfast.   magnesium hydroxide (MILK OF MAGNESIA) 400 MG/5ML suspension Take 30 mLs by mouth daily as needed for mild constipation.   Multiple Vitamin (MULTI-VITAMINS) TABS Take 1 tablet by mouth daily.   mupirocin ointment (BACTROBAN) 2 % 1 Application daily. Apply to scalp daily   nystatin (MYCOSTATIN/NYSTOP) powder Apply 1 Application topically as needed.   ondansetron (ZOFRAN) 4 MG tablet Take 4 mg by mouth as needed for nausea or vomiting. Up to three times a day   sertraline (ZOLOFT) 100 MG tablet Take 100 mg by mouth daily.   torsemide (DEMADEX) 20 MG tablet Take 20 mg by mouth every Monday, Wednesday, and Friday.   Zinc Oxide (DESITIN) 13 % CREA Apply topically. Apply to rectum topically ever 2 hours  needed   witch hazel-glycerin (TUCKS) pad Apply topically as needed for itching.   [DISCONTINUED] sertraline (ZOLOFT) 50 MG tablet Take 50 mg by mouth daily.   [DISCONTINUED] torsemide (DEMADEX) 20 MG tablet Take 1 tablet (20 mg total) by mouth daily. (Patient not taking: Reported on 03/27/2022)   No facility-administered encounter medications on file as of 03/27/2022.    Review of Systems  Constitutional:  Negative for activity change, appetite change, fatigue and unexpected weight change.  HENT:  Negative for congestion and hearing loss.   Eyes: Negative.   Respiratory:  Negative for cough and shortness of breath.    Cardiovascular:  Negative for chest pain, palpitations and leg swelling.  Gastrointestinal:  Negative for abdominal pain, constipation and diarrhea.  Genitourinary:  Negative for difficulty urinating and dysuria.  Musculoskeletal:  Negative for arthralgias and myalgias.  Skin:  Positive for wound. Negative for color change.  Neurological:  Negative for dizziness and weakness.  Psychiatric/Behavioral:  Negative for agitation, behavioral problems and confusion.        Low mood- reports more depressed since he has to accept moving into skilled care.     Immunization History  Administered Date(s) Administered   Influenza, High Dose Seasonal PF 01/25/2017, 03/04/2018, 02/21/2022   Influenza-Unspecified 03/14/2015, 02/21/2021   Moderna Sars-Covid-2 Vaccination 05/18/2019, 06/15/2019, 03/16/2022   Pneumococcal Conjugate-13 07/09/2014, 12/05/2015   Pneumococcal Polysaccharide-23 12/04/2016   Tdap 04/12/2018   Unspecified SARS-COV-2 Vaccination 10/03/2021   Pertinent  Health Maintenance Due  Topic Date Due   INFLUENZA VACCINE  Completed      02/26/2022  9:00 PM 02/27/2022    7:46 PM 02/28/2022    2:00 PM 02/28/2022    7:53 PM 03/01/2022    8:30 AM  Fall Risk  Patient Fall Risk Level High fall risk High fall risk High fall risk High fall risk High fall risk   Functional Status Survey:    Vitals:   03/27/22 0932  BP: 123/74  Pulse: 70  SpO2: 98%  Weight: 150 lb (68 kg)  Height: '5\' 9"'$  (1.753 m)   Body mass index is 22.15 kg/m. Physical Exam Constitutional:      General: He is not in acute distress.    Appearance: He is well-developed. He is not diaphoretic.  HENT:     Head: Normocephalic and atraumatic.     Right Ear: External ear normal.     Left Ear: External ear normal.     Mouth/Throat:     Pharynx: No oropharyngeal exudate.  Eyes:     Conjunctiva/sclera: Conjunctivae normal.     Pupils: Pupils are equal, round, and reactive to light.  Cardiovascular:      Rate and Rhythm: Normal rate and regular rhythm.     Heart sounds: Normal heart sounds.  Pulmonary:     Effort: Pulmonary effort is normal.     Breath sounds: Normal breath sounds.  Abdominal:     General: Bowel sounds are normal.     Palpations: Abdomen is soft.  Musculoskeletal:        General: No tenderness.     Cervical back: Normal range of motion and neck supple.     Right lower leg: Edema (trace) present.     Left lower leg: Edema (trace) present.  Skin:    General: Skin is warm and dry.  Neurological:     Mental Status: He is alert and oriented to person, place, and time.  Psychiatric:        Mood and Affect: Mood normal.     Labs reviewed: Recent Labs    02/23/22 0715 02/23/22 1359 02/25/22 0542 02/26/22 0653 02/28/22 0433 03/01/22 0625 03/26/22 0000  NA 126*   < > 129* 128* 127* 129* 140  K 4.1   < > 3.5 3.3* 3.7 3.3* 4.5  CL 96*   < > 99 94* 91* 90* 108  CO2 21*   < > '23 24 26 26 '$ 23*  GLUCOSE 101*   < > 109* 102* 100* 99  --   BUN 22   < > 20 18 28* 35* 39*  CREATININE 1.15   < > 1.20 1.30* 1.37* 1.49* 1.4*  CALCIUM 8.9   < > 8.2* 8.3* 8.0* 8.3* 8.7  MG 2.0   < > 1.9 1.8 1.8  --   --   PHOS 3.4  --   --   --   --   --   --    < > = values in this interval not displayed.   Recent Labs    02/23/22 0715 03/26/22 0000  AST 31 12*  ALT 29 12  ALKPHOS 90 74  BILITOT 0.9  --   PROT 7.2  --   ALBUMIN 3.7 6.0*   Recent Labs    02/23/22 0715 02/23/22 1359 02/24/22 0829 02/25/22 0542 02/26/22 0653 02/28/22 0433 03/26/22 0000  WBC 4.8   < > 6.3 4.8 4.9 5.8 4.9  NEUTROABS 3.6  --  5.0  --   --   --  3,023.00  HGB 10.8*   < > 10.3* 9.4*  10.0* 9.7* 12.5*  HCT 33.4*   < > 31.4* 27.7* 29.3* 28.9* 37*  MCV 87.0   < > 86.5 84.2 85.2 85.5  --   PLT 249   < > 250 237 234 226 203   < > = values in this interval not displayed.   Lab Results  Component Value Date   TSH 8.962 (H) 02/23/2022   No results found for: "HGBA1C" No results found for: "CHOL",  "HDL", "LDLCALC", "LDLDIRECT", "TRIG", "CHOLHDL"  Significant Diagnostic Results in last 30 days:  No results found.  Assessment/Plan 1. Protein deficiency ([place]) -continues on supplement.  2. Paroxysmal atrial fibrillation (HCC) -rate controled. Continues on eliquis for anticoagulation.   3. Primary hypertension -Blood pressure well controlled, goal bp <140/90 Continue current medications and dietary modifications follow metabolic panel  4. Stage 3b chronic kidney disease (HCC) -Chronic and stable Encourage proper hydration Follow metabolic panel Avoid nephrotoxic meds (NSAIDS)  5. Chronic systolic heart failure (HCC) -euvolemic at this time, reduced EF, continues on demadex, will follow up with heart failure clinic today.   6. Hypothyroidism, unspecified type -TSH slightly elevated on last labs, will monitor  7. Hyponatremia -resolved at thist ime.   8. Moderate episode of recurrent major depressive disorder ([place]) -worse due to recent move and changes in health. Continue with support of staff, family and zoloft 50 mg. Will monitor and adjust medication if needed  9. Impacted cerumen of right ear Debrox BID for 4 days and then to have staff flush ear  Mariaguadalupe Fialkowski K. [place], [facility] [phone]

## 2022-03-30 DIAGNOSIS — E871 Hypo-osmolality and hyponatremia: Secondary | ICD-10-CM | POA: Diagnosis not present

## 2022-03-30 DIAGNOSIS — I110 Hypertensive heart disease with heart failure: Secondary | ICD-10-CM | POA: Diagnosis not present

## 2022-03-30 DIAGNOSIS — R278 Other lack of coordination: Secondary | ICD-10-CM | POA: Diagnosis not present

## 2022-03-30 DIAGNOSIS — D649 Anemia, unspecified: Secondary | ICD-10-CM | POA: Diagnosis not present

## 2022-03-30 DIAGNOSIS — E039 Hypothyroidism, unspecified: Secondary | ICD-10-CM | POA: Diagnosis not present

## 2022-03-30 DIAGNOSIS — I480 Paroxysmal atrial fibrillation: Secondary | ICD-10-CM | POA: Diagnosis not present

## 2022-03-30 DIAGNOSIS — R2681 Unsteadiness on feet: Secondary | ICD-10-CM | POA: Diagnosis not present

## 2022-03-30 DIAGNOSIS — Z741 Need for assistance with personal care: Secondary | ICD-10-CM | POA: Diagnosis not present

## 2022-03-30 DIAGNOSIS — N1831 Chronic kidney disease, stage 3a: Secondary | ICD-10-CM | POA: Diagnosis not present

## 2022-03-30 DIAGNOSIS — I495 Sick sinus syndrome: Secondary | ICD-10-CM | POA: Diagnosis not present

## 2022-03-30 DIAGNOSIS — M6281 Muscle weakness (generalized): Secondary | ICD-10-CM | POA: Diagnosis not present

## 2022-03-30 DIAGNOSIS — I130 Hypertensive heart and chronic kidney disease with heart failure and stage 1 through stage 4 chronic kidney disease, or unspecified chronic kidney disease: Secondary | ICD-10-CM | POA: Diagnosis not present

## 2022-03-30 DIAGNOSIS — Z950 Presence of cardiac pacemaker: Secondary | ICD-10-CM | POA: Diagnosis not present

## 2022-03-30 DIAGNOSIS — F32A Depression, unspecified: Secondary | ICD-10-CM | POA: Diagnosis not present

## 2022-03-30 DIAGNOSIS — I5023 Acute on chronic systolic (congestive) heart failure: Secondary | ICD-10-CM | POA: Diagnosis not present

## 2022-03-30 DIAGNOSIS — R262 Difficulty in walking, not elsewhere classified: Secondary | ICD-10-CM | POA: Diagnosis not present

## 2022-03-31 DIAGNOSIS — R262 Difficulty in walking, not elsewhere classified: Secondary | ICD-10-CM | POA: Diagnosis not present

## 2022-03-31 DIAGNOSIS — N1831 Chronic kidney disease, stage 3a: Secondary | ICD-10-CM | POA: Diagnosis not present

## 2022-03-31 DIAGNOSIS — I130 Hypertensive heart and chronic kidney disease with heart failure and stage 1 through stage 4 chronic kidney disease, or unspecified chronic kidney disease: Secondary | ICD-10-CM | POA: Diagnosis not present

## 2022-03-31 DIAGNOSIS — E039 Hypothyroidism, unspecified: Secondary | ICD-10-CM | POA: Diagnosis not present

## 2022-03-31 DIAGNOSIS — M6281 Muscle weakness (generalized): Secondary | ICD-10-CM | POA: Diagnosis not present

## 2022-03-31 DIAGNOSIS — I110 Hypertensive heart disease with heart failure: Secondary | ICD-10-CM | POA: Diagnosis not present

## 2022-03-31 DIAGNOSIS — I480 Paroxysmal atrial fibrillation: Secondary | ICD-10-CM | POA: Diagnosis not present

## 2022-03-31 DIAGNOSIS — Z950 Presence of cardiac pacemaker: Secondary | ICD-10-CM | POA: Diagnosis not present

## 2022-03-31 DIAGNOSIS — I495 Sick sinus syndrome: Secondary | ICD-10-CM | POA: Diagnosis not present

## 2022-03-31 DIAGNOSIS — R2681 Unsteadiness on feet: Secondary | ICD-10-CM | POA: Diagnosis not present

## 2022-03-31 DIAGNOSIS — D649 Anemia, unspecified: Secondary | ICD-10-CM | POA: Diagnosis not present

## 2022-03-31 DIAGNOSIS — I5023 Acute on chronic systolic (congestive) heart failure: Secondary | ICD-10-CM | POA: Diagnosis not present

## 2022-03-31 DIAGNOSIS — F32A Depression, unspecified: Secondary | ICD-10-CM | POA: Diagnosis not present

## 2022-03-31 DIAGNOSIS — Z741 Need for assistance with personal care: Secondary | ICD-10-CM | POA: Diagnosis not present

## 2022-03-31 DIAGNOSIS — E871 Hypo-osmolality and hyponatremia: Secondary | ICD-10-CM | POA: Diagnosis not present

## 2022-03-31 DIAGNOSIS — R278 Other lack of coordination: Secondary | ICD-10-CM | POA: Diagnosis not present

## 2022-04-03 DIAGNOSIS — I110 Hypertensive heart disease with heart failure: Secondary | ICD-10-CM | POA: Diagnosis not present

## 2022-04-03 DIAGNOSIS — R262 Difficulty in walking, not elsewhere classified: Secondary | ICD-10-CM | POA: Diagnosis not present

## 2022-04-03 DIAGNOSIS — I5023 Acute on chronic systolic (congestive) heart failure: Secondary | ICD-10-CM | POA: Diagnosis not present

## 2022-04-03 DIAGNOSIS — F32A Depression, unspecified: Secondary | ICD-10-CM | POA: Diagnosis not present

## 2022-04-03 DIAGNOSIS — I480 Paroxysmal atrial fibrillation: Secondary | ICD-10-CM | POA: Diagnosis not present

## 2022-04-03 DIAGNOSIS — D649 Anemia, unspecified: Secondary | ICD-10-CM | POA: Diagnosis not present

## 2022-04-03 DIAGNOSIS — I130 Hypertensive heart and chronic kidney disease with heart failure and stage 1 through stage 4 chronic kidney disease, or unspecified chronic kidney disease: Secondary | ICD-10-CM | POA: Diagnosis not present

## 2022-04-03 DIAGNOSIS — Z950 Presence of cardiac pacemaker: Secondary | ICD-10-CM | POA: Diagnosis not present

## 2022-04-03 DIAGNOSIS — Z741 Need for assistance with personal care: Secondary | ICD-10-CM | POA: Diagnosis not present

## 2022-04-03 DIAGNOSIS — M6281 Muscle weakness (generalized): Secondary | ICD-10-CM | POA: Diagnosis not present

## 2022-04-03 DIAGNOSIS — E039 Hypothyroidism, unspecified: Secondary | ICD-10-CM | POA: Diagnosis not present

## 2022-04-03 DIAGNOSIS — R2681 Unsteadiness on feet: Secondary | ICD-10-CM | POA: Diagnosis not present

## 2022-04-03 DIAGNOSIS — E871 Hypo-osmolality and hyponatremia: Secondary | ICD-10-CM | POA: Diagnosis not present

## 2022-04-03 DIAGNOSIS — N1831 Chronic kidney disease, stage 3a: Secondary | ICD-10-CM | POA: Diagnosis not present

## 2022-04-03 DIAGNOSIS — I495 Sick sinus syndrome: Secondary | ICD-10-CM | POA: Diagnosis not present

## 2022-04-03 DIAGNOSIS — R278 Other lack of coordination: Secondary | ICD-10-CM | POA: Diagnosis not present

## 2022-04-04 DIAGNOSIS — R262 Difficulty in walking, not elsewhere classified: Secondary | ICD-10-CM | POA: Diagnosis not present

## 2022-04-04 DIAGNOSIS — I110 Hypertensive heart disease with heart failure: Secondary | ICD-10-CM | POA: Diagnosis not present

## 2022-04-04 DIAGNOSIS — F32A Depression, unspecified: Secondary | ICD-10-CM | POA: Diagnosis not present

## 2022-04-04 DIAGNOSIS — Z741 Need for assistance with personal care: Secondary | ICD-10-CM | POA: Diagnosis not present

## 2022-04-04 DIAGNOSIS — Z950 Presence of cardiac pacemaker: Secondary | ICD-10-CM | POA: Diagnosis not present

## 2022-04-04 DIAGNOSIS — D649 Anemia, unspecified: Secondary | ICD-10-CM | POA: Diagnosis not present

## 2022-04-04 DIAGNOSIS — E871 Hypo-osmolality and hyponatremia: Secondary | ICD-10-CM | POA: Diagnosis not present

## 2022-04-04 DIAGNOSIS — I130 Hypertensive heart and chronic kidney disease with heart failure and stage 1 through stage 4 chronic kidney disease, or unspecified chronic kidney disease: Secondary | ICD-10-CM | POA: Diagnosis not present

## 2022-04-04 DIAGNOSIS — R278 Other lack of coordination: Secondary | ICD-10-CM | POA: Diagnosis not present

## 2022-04-04 DIAGNOSIS — I495 Sick sinus syndrome: Secondary | ICD-10-CM | POA: Diagnosis not present

## 2022-04-04 DIAGNOSIS — M6281 Muscle weakness (generalized): Secondary | ICD-10-CM | POA: Diagnosis not present

## 2022-04-04 DIAGNOSIS — E039 Hypothyroidism, unspecified: Secondary | ICD-10-CM | POA: Diagnosis not present

## 2022-04-04 DIAGNOSIS — I480 Paroxysmal atrial fibrillation: Secondary | ICD-10-CM | POA: Diagnosis not present

## 2022-04-04 DIAGNOSIS — N1831 Chronic kidney disease, stage 3a: Secondary | ICD-10-CM | POA: Diagnosis not present

## 2022-04-04 DIAGNOSIS — I5023 Acute on chronic systolic (congestive) heart failure: Secondary | ICD-10-CM | POA: Diagnosis not present

## 2022-04-04 DIAGNOSIS — R2681 Unsteadiness on feet: Secondary | ICD-10-CM | POA: Diagnosis not present

## 2022-04-05 DIAGNOSIS — M6281 Muscle weakness (generalized): Secondary | ICD-10-CM | POA: Diagnosis not present

## 2022-04-05 DIAGNOSIS — E039 Hypothyroidism, unspecified: Secondary | ICD-10-CM | POA: Diagnosis not present

## 2022-04-05 DIAGNOSIS — R2681 Unsteadiness on feet: Secondary | ICD-10-CM | POA: Diagnosis not present

## 2022-04-05 DIAGNOSIS — D649 Anemia, unspecified: Secondary | ICD-10-CM | POA: Diagnosis not present

## 2022-04-05 DIAGNOSIS — N1831 Chronic kidney disease, stage 3a: Secondary | ICD-10-CM | POA: Diagnosis not present

## 2022-04-05 DIAGNOSIS — F32A Depression, unspecified: Secondary | ICD-10-CM | POA: Diagnosis not present

## 2022-04-05 DIAGNOSIS — E871 Hypo-osmolality and hyponatremia: Secondary | ICD-10-CM | POA: Diagnosis not present

## 2022-04-05 DIAGNOSIS — I110 Hypertensive heart disease with heart failure: Secondary | ICD-10-CM | POA: Diagnosis not present

## 2022-04-05 DIAGNOSIS — Z741 Need for assistance with personal care: Secondary | ICD-10-CM | POA: Diagnosis not present

## 2022-04-05 DIAGNOSIS — Z950 Presence of cardiac pacemaker: Secondary | ICD-10-CM | POA: Diagnosis not present

## 2022-04-05 DIAGNOSIS — R262 Difficulty in walking, not elsewhere classified: Secondary | ICD-10-CM | POA: Diagnosis not present

## 2022-04-05 DIAGNOSIS — I5023 Acute on chronic systolic (congestive) heart failure: Secondary | ICD-10-CM | POA: Diagnosis not present

## 2022-04-05 DIAGNOSIS — R278 Other lack of coordination: Secondary | ICD-10-CM | POA: Diagnosis not present

## 2022-04-05 DIAGNOSIS — I130 Hypertensive heart and chronic kidney disease with heart failure and stage 1 through stage 4 chronic kidney disease, or unspecified chronic kidney disease: Secondary | ICD-10-CM | POA: Diagnosis not present

## 2022-04-05 DIAGNOSIS — I480 Paroxysmal atrial fibrillation: Secondary | ICD-10-CM | POA: Diagnosis not present

## 2022-04-05 DIAGNOSIS — I495 Sick sinus syndrome: Secondary | ICD-10-CM | POA: Diagnosis not present

## 2022-04-07 DIAGNOSIS — R350 Frequency of micturition: Secondary | ICD-10-CM | POA: Diagnosis not present

## 2022-04-07 DIAGNOSIS — I130 Hypertensive heart and chronic kidney disease with heart failure and stage 1 through stage 4 chronic kidney disease, or unspecified chronic kidney disease: Secondary | ICD-10-CM | POA: Diagnosis not present

## 2022-04-07 DIAGNOSIS — E871 Hypo-osmolality and hyponatremia: Secondary | ICD-10-CM | POA: Diagnosis not present

## 2022-04-07 DIAGNOSIS — R3915 Urgency of urination: Secondary | ICD-10-CM | POA: Diagnosis not present

## 2022-04-07 DIAGNOSIS — N1831 Chronic kidney disease, stage 3a: Secondary | ICD-10-CM | POA: Diagnosis not present

## 2022-04-07 DIAGNOSIS — I110 Hypertensive heart disease with heart failure: Secondary | ICD-10-CM | POA: Diagnosis not present

## 2022-04-07 DIAGNOSIS — F32A Depression, unspecified: Secondary | ICD-10-CM | POA: Diagnosis not present

## 2022-04-07 DIAGNOSIS — R262 Difficulty in walking, not elsewhere classified: Secondary | ICD-10-CM | POA: Diagnosis not present

## 2022-04-07 DIAGNOSIS — R278 Other lack of coordination: Secondary | ICD-10-CM | POA: Diagnosis not present

## 2022-04-07 DIAGNOSIS — E039 Hypothyroidism, unspecified: Secondary | ICD-10-CM | POA: Diagnosis not present

## 2022-04-07 DIAGNOSIS — I5023 Acute on chronic systolic (congestive) heart failure: Secondary | ICD-10-CM | POA: Diagnosis not present

## 2022-04-07 DIAGNOSIS — R300 Dysuria: Secondary | ICD-10-CM | POA: Diagnosis not present

## 2022-04-07 DIAGNOSIS — Z741 Need for assistance with personal care: Secondary | ICD-10-CM | POA: Diagnosis not present

## 2022-04-07 DIAGNOSIS — R2681 Unsteadiness on feet: Secondary | ICD-10-CM | POA: Diagnosis not present

## 2022-04-07 DIAGNOSIS — D649 Anemia, unspecified: Secondary | ICD-10-CM | POA: Diagnosis not present

## 2022-04-07 DIAGNOSIS — I495 Sick sinus syndrome: Secondary | ICD-10-CM | POA: Diagnosis not present

## 2022-04-07 DIAGNOSIS — Z950 Presence of cardiac pacemaker: Secondary | ICD-10-CM | POA: Diagnosis not present

## 2022-04-07 DIAGNOSIS — M6281 Muscle weakness (generalized): Secondary | ICD-10-CM | POA: Diagnosis not present

## 2022-04-07 DIAGNOSIS — I480 Paroxysmal atrial fibrillation: Secondary | ICD-10-CM | POA: Diagnosis not present

## 2022-04-09 DIAGNOSIS — I130 Hypertensive heart and chronic kidney disease with heart failure and stage 1 through stage 4 chronic kidney disease, or unspecified chronic kidney disease: Secondary | ICD-10-CM | POA: Diagnosis not present

## 2022-04-09 DIAGNOSIS — F32A Depression, unspecified: Secondary | ICD-10-CM | POA: Diagnosis not present

## 2022-04-09 DIAGNOSIS — I495 Sick sinus syndrome: Secondary | ICD-10-CM | POA: Diagnosis not present

## 2022-04-09 DIAGNOSIS — Z741 Need for assistance with personal care: Secondary | ICD-10-CM | POA: Diagnosis not present

## 2022-04-09 DIAGNOSIS — D649 Anemia, unspecified: Secondary | ICD-10-CM | POA: Diagnosis not present

## 2022-04-09 DIAGNOSIS — I480 Paroxysmal atrial fibrillation: Secondary | ICD-10-CM | POA: Diagnosis not present

## 2022-04-09 DIAGNOSIS — M6281 Muscle weakness (generalized): Secondary | ICD-10-CM | POA: Diagnosis not present

## 2022-04-09 DIAGNOSIS — E039 Hypothyroidism, unspecified: Secondary | ICD-10-CM | POA: Diagnosis not present

## 2022-04-09 DIAGNOSIS — N1831 Chronic kidney disease, stage 3a: Secondary | ICD-10-CM | POA: Diagnosis not present

## 2022-04-09 DIAGNOSIS — R262 Difficulty in walking, not elsewhere classified: Secondary | ICD-10-CM | POA: Diagnosis not present

## 2022-04-09 DIAGNOSIS — I110 Hypertensive heart disease with heart failure: Secondary | ICD-10-CM | POA: Diagnosis not present

## 2022-04-09 DIAGNOSIS — I5023 Acute on chronic systolic (congestive) heart failure: Secondary | ICD-10-CM | POA: Diagnosis not present

## 2022-04-09 DIAGNOSIS — Z950 Presence of cardiac pacemaker: Secondary | ICD-10-CM | POA: Diagnosis not present

## 2022-04-09 DIAGNOSIS — R2681 Unsteadiness on feet: Secondary | ICD-10-CM | POA: Diagnosis not present

## 2022-04-09 DIAGNOSIS — E871 Hypo-osmolality and hyponatremia: Secondary | ICD-10-CM | POA: Diagnosis not present

## 2022-04-09 DIAGNOSIS — R278 Other lack of coordination: Secondary | ICD-10-CM | POA: Diagnosis not present

## 2022-04-10 DIAGNOSIS — R2681 Unsteadiness on feet: Secondary | ICD-10-CM | POA: Diagnosis not present

## 2022-04-10 DIAGNOSIS — D649 Anemia, unspecified: Secondary | ICD-10-CM | POA: Diagnosis not present

## 2022-04-10 DIAGNOSIS — I5023 Acute on chronic systolic (congestive) heart failure: Secondary | ICD-10-CM | POA: Diagnosis not present

## 2022-04-10 DIAGNOSIS — I495 Sick sinus syndrome: Secondary | ICD-10-CM | POA: Diagnosis not present

## 2022-04-10 DIAGNOSIS — R262 Difficulty in walking, not elsewhere classified: Secondary | ICD-10-CM | POA: Diagnosis not present

## 2022-04-10 DIAGNOSIS — N1831 Chronic kidney disease, stage 3a: Secondary | ICD-10-CM | POA: Diagnosis not present

## 2022-04-10 DIAGNOSIS — E039 Hypothyroidism, unspecified: Secondary | ICD-10-CM | POA: Diagnosis not present

## 2022-04-10 DIAGNOSIS — F32A Depression, unspecified: Secondary | ICD-10-CM | POA: Diagnosis not present

## 2022-04-10 DIAGNOSIS — E871 Hypo-osmolality and hyponatremia: Secondary | ICD-10-CM | POA: Diagnosis not present

## 2022-04-10 DIAGNOSIS — I110 Hypertensive heart disease with heart failure: Secondary | ICD-10-CM | POA: Diagnosis not present

## 2022-04-10 DIAGNOSIS — I130 Hypertensive heart and chronic kidney disease with heart failure and stage 1 through stage 4 chronic kidney disease, or unspecified chronic kidney disease: Secondary | ICD-10-CM | POA: Diagnosis not present

## 2022-04-10 DIAGNOSIS — R278 Other lack of coordination: Secondary | ICD-10-CM | POA: Diagnosis not present

## 2022-04-10 DIAGNOSIS — M6281 Muscle weakness (generalized): Secondary | ICD-10-CM | POA: Diagnosis not present

## 2022-04-10 DIAGNOSIS — Z950 Presence of cardiac pacemaker: Secondary | ICD-10-CM | POA: Diagnosis not present

## 2022-04-10 DIAGNOSIS — I480 Paroxysmal atrial fibrillation: Secondary | ICD-10-CM | POA: Diagnosis not present

## 2022-04-10 DIAGNOSIS — Z741 Need for assistance with personal care: Secondary | ICD-10-CM | POA: Diagnosis not present

## 2022-04-11 DIAGNOSIS — D649 Anemia, unspecified: Secondary | ICD-10-CM | POA: Diagnosis not present

## 2022-04-11 DIAGNOSIS — I110 Hypertensive heart disease with heart failure: Secondary | ICD-10-CM | POA: Diagnosis not present

## 2022-04-11 DIAGNOSIS — Z741 Need for assistance with personal care: Secondary | ICD-10-CM | POA: Diagnosis not present

## 2022-04-11 DIAGNOSIS — I480 Paroxysmal atrial fibrillation: Secondary | ICD-10-CM | POA: Diagnosis not present

## 2022-04-11 DIAGNOSIS — I5023 Acute on chronic systolic (congestive) heart failure: Secondary | ICD-10-CM | POA: Diagnosis not present

## 2022-04-11 DIAGNOSIS — R262 Difficulty in walking, not elsewhere classified: Secondary | ICD-10-CM | POA: Diagnosis not present

## 2022-04-11 DIAGNOSIS — N1831 Chronic kidney disease, stage 3a: Secondary | ICD-10-CM | POA: Diagnosis not present

## 2022-04-11 DIAGNOSIS — I495 Sick sinus syndrome: Secondary | ICD-10-CM | POA: Diagnosis not present

## 2022-04-11 DIAGNOSIS — E871 Hypo-osmolality and hyponatremia: Secondary | ICD-10-CM | POA: Diagnosis not present

## 2022-04-11 DIAGNOSIS — Z950 Presence of cardiac pacemaker: Secondary | ICD-10-CM | POA: Diagnosis not present

## 2022-04-11 DIAGNOSIS — R2681 Unsteadiness on feet: Secondary | ICD-10-CM | POA: Diagnosis not present

## 2022-04-11 DIAGNOSIS — I130 Hypertensive heart and chronic kidney disease with heart failure and stage 1 through stage 4 chronic kidney disease, or unspecified chronic kidney disease: Secondary | ICD-10-CM | POA: Diagnosis not present

## 2022-04-11 DIAGNOSIS — R278 Other lack of coordination: Secondary | ICD-10-CM | POA: Diagnosis not present

## 2022-04-11 DIAGNOSIS — F32A Depression, unspecified: Secondary | ICD-10-CM | POA: Diagnosis not present

## 2022-04-11 DIAGNOSIS — E039 Hypothyroidism, unspecified: Secondary | ICD-10-CM | POA: Diagnosis not present

## 2022-04-11 DIAGNOSIS — M6281 Muscle weakness (generalized): Secondary | ICD-10-CM | POA: Diagnosis not present

## 2022-04-12 DIAGNOSIS — E871 Hypo-osmolality and hyponatremia: Secondary | ICD-10-CM | POA: Diagnosis not present

## 2022-04-12 DIAGNOSIS — R278 Other lack of coordination: Secondary | ICD-10-CM | POA: Diagnosis not present

## 2022-04-12 DIAGNOSIS — I110 Hypertensive heart disease with heart failure: Secondary | ICD-10-CM | POA: Diagnosis not present

## 2022-04-12 DIAGNOSIS — I5023 Acute on chronic systolic (congestive) heart failure: Secondary | ICD-10-CM | POA: Diagnosis not present

## 2022-04-12 DIAGNOSIS — D649 Anemia, unspecified: Secondary | ICD-10-CM | POA: Diagnosis not present

## 2022-04-12 DIAGNOSIS — I130 Hypertensive heart and chronic kidney disease with heart failure and stage 1 through stage 4 chronic kidney disease, or unspecified chronic kidney disease: Secondary | ICD-10-CM | POA: Diagnosis not present

## 2022-04-12 DIAGNOSIS — R262 Difficulty in walking, not elsewhere classified: Secondary | ICD-10-CM | POA: Diagnosis not present

## 2022-04-12 DIAGNOSIS — E039 Hypothyroidism, unspecified: Secondary | ICD-10-CM | POA: Diagnosis not present

## 2022-04-12 DIAGNOSIS — I480 Paroxysmal atrial fibrillation: Secondary | ICD-10-CM | POA: Diagnosis not present

## 2022-04-12 DIAGNOSIS — N1831 Chronic kidney disease, stage 3a: Secondary | ICD-10-CM | POA: Diagnosis not present

## 2022-04-12 DIAGNOSIS — I495 Sick sinus syndrome: Secondary | ICD-10-CM | POA: Diagnosis not present

## 2022-04-12 DIAGNOSIS — Z950 Presence of cardiac pacemaker: Secondary | ICD-10-CM | POA: Diagnosis not present

## 2022-04-12 DIAGNOSIS — M6281 Muscle weakness (generalized): Secondary | ICD-10-CM | POA: Diagnosis not present

## 2022-04-12 DIAGNOSIS — F32A Depression, unspecified: Secondary | ICD-10-CM | POA: Diagnosis not present

## 2022-04-12 DIAGNOSIS — Z741 Need for assistance with personal care: Secondary | ICD-10-CM | POA: Diagnosis not present

## 2022-04-12 DIAGNOSIS — R2681 Unsteadiness on feet: Secondary | ICD-10-CM | POA: Diagnosis not present

## 2022-04-13 DIAGNOSIS — I5023 Acute on chronic systolic (congestive) heart failure: Secondary | ICD-10-CM | POA: Diagnosis not present

## 2022-04-13 DIAGNOSIS — N1831 Chronic kidney disease, stage 3a: Secondary | ICD-10-CM | POA: Diagnosis not present

## 2022-04-13 DIAGNOSIS — Z950 Presence of cardiac pacemaker: Secondary | ICD-10-CM | POA: Diagnosis not present

## 2022-04-13 DIAGNOSIS — E871 Hypo-osmolality and hyponatremia: Secondary | ICD-10-CM | POA: Diagnosis not present

## 2022-04-13 DIAGNOSIS — I480 Paroxysmal atrial fibrillation: Secondary | ICD-10-CM | POA: Diagnosis not present

## 2022-04-13 DIAGNOSIS — M6281 Muscle weakness (generalized): Secondary | ICD-10-CM | POA: Diagnosis not present

## 2022-04-13 DIAGNOSIS — I130 Hypertensive heart and chronic kidney disease with heart failure and stage 1 through stage 4 chronic kidney disease, or unspecified chronic kidney disease: Secondary | ICD-10-CM | POA: Diagnosis not present

## 2022-04-13 DIAGNOSIS — R2681 Unsteadiness on feet: Secondary | ICD-10-CM | POA: Diagnosis not present

## 2022-04-13 DIAGNOSIS — R278 Other lack of coordination: Secondary | ICD-10-CM | POA: Diagnosis not present

## 2022-04-13 DIAGNOSIS — F32A Depression, unspecified: Secondary | ICD-10-CM | POA: Diagnosis not present

## 2022-04-13 DIAGNOSIS — D649 Anemia, unspecified: Secondary | ICD-10-CM | POA: Diagnosis not present

## 2022-04-13 DIAGNOSIS — R262 Difficulty in walking, not elsewhere classified: Secondary | ICD-10-CM | POA: Diagnosis not present

## 2022-04-13 DIAGNOSIS — Z741 Need for assistance with personal care: Secondary | ICD-10-CM | POA: Diagnosis not present

## 2022-04-13 DIAGNOSIS — I495 Sick sinus syndrome: Secondary | ICD-10-CM | POA: Diagnosis not present

## 2022-04-13 DIAGNOSIS — I110 Hypertensive heart disease with heart failure: Secondary | ICD-10-CM | POA: Diagnosis not present

## 2022-04-13 DIAGNOSIS — E039 Hypothyroidism, unspecified: Secondary | ICD-10-CM | POA: Diagnosis not present

## 2022-04-14 DIAGNOSIS — Z741 Need for assistance with personal care: Secondary | ICD-10-CM | POA: Diagnosis not present

## 2022-04-14 DIAGNOSIS — N1831 Chronic kidney disease, stage 3a: Secondary | ICD-10-CM | POA: Diagnosis not present

## 2022-04-14 DIAGNOSIS — D649 Anemia, unspecified: Secondary | ICD-10-CM | POA: Diagnosis not present

## 2022-04-14 DIAGNOSIS — M6281 Muscle weakness (generalized): Secondary | ICD-10-CM | POA: Diagnosis not present

## 2022-04-14 DIAGNOSIS — E871 Hypo-osmolality and hyponatremia: Secondary | ICD-10-CM | POA: Diagnosis not present

## 2022-04-14 DIAGNOSIS — F32A Depression, unspecified: Secondary | ICD-10-CM | POA: Diagnosis not present

## 2022-04-14 DIAGNOSIS — R262 Difficulty in walking, not elsewhere classified: Secondary | ICD-10-CM | POA: Diagnosis not present

## 2022-04-14 DIAGNOSIS — I130 Hypertensive heart and chronic kidney disease with heart failure and stage 1 through stage 4 chronic kidney disease, or unspecified chronic kidney disease: Secondary | ICD-10-CM | POA: Diagnosis not present

## 2022-04-14 DIAGNOSIS — I480 Paroxysmal atrial fibrillation: Secondary | ICD-10-CM | POA: Diagnosis not present

## 2022-04-14 DIAGNOSIS — I5023 Acute on chronic systolic (congestive) heart failure: Secondary | ICD-10-CM | POA: Diagnosis not present

## 2022-04-14 DIAGNOSIS — I110 Hypertensive heart disease with heart failure: Secondary | ICD-10-CM | POA: Diagnosis not present

## 2022-04-14 DIAGNOSIS — R278 Other lack of coordination: Secondary | ICD-10-CM | POA: Diagnosis not present

## 2022-04-14 DIAGNOSIS — I495 Sick sinus syndrome: Secondary | ICD-10-CM | POA: Diagnosis not present

## 2022-04-14 DIAGNOSIS — R2681 Unsteadiness on feet: Secondary | ICD-10-CM | POA: Diagnosis not present

## 2022-04-14 DIAGNOSIS — Z950 Presence of cardiac pacemaker: Secondary | ICD-10-CM | POA: Diagnosis not present

## 2022-04-14 DIAGNOSIS — E039 Hypothyroidism, unspecified: Secondary | ICD-10-CM | POA: Diagnosis not present

## 2022-04-17 DIAGNOSIS — I495 Sick sinus syndrome: Secondary | ICD-10-CM | POA: Diagnosis not present

## 2022-04-17 DIAGNOSIS — F32A Depression, unspecified: Secondary | ICD-10-CM | POA: Diagnosis not present

## 2022-04-17 DIAGNOSIS — Z950 Presence of cardiac pacemaker: Secondary | ICD-10-CM | POA: Diagnosis not present

## 2022-04-17 DIAGNOSIS — R262 Difficulty in walking, not elsewhere classified: Secondary | ICD-10-CM | POA: Diagnosis not present

## 2022-04-17 DIAGNOSIS — E039 Hypothyroidism, unspecified: Secondary | ICD-10-CM | POA: Diagnosis not present

## 2022-04-17 DIAGNOSIS — I110 Hypertensive heart disease with heart failure: Secondary | ICD-10-CM | POA: Diagnosis not present

## 2022-04-17 DIAGNOSIS — R2681 Unsteadiness on feet: Secondary | ICD-10-CM | POA: Diagnosis not present

## 2022-04-17 DIAGNOSIS — Z741 Need for assistance with personal care: Secondary | ICD-10-CM | POA: Diagnosis not present

## 2022-04-17 DIAGNOSIS — N1831 Chronic kidney disease, stage 3a: Secondary | ICD-10-CM | POA: Diagnosis not present

## 2022-04-17 DIAGNOSIS — R278 Other lack of coordination: Secondary | ICD-10-CM | POA: Diagnosis not present

## 2022-04-17 DIAGNOSIS — I130 Hypertensive heart and chronic kidney disease with heart failure and stage 1 through stage 4 chronic kidney disease, or unspecified chronic kidney disease: Secondary | ICD-10-CM | POA: Diagnosis not present

## 2022-04-17 DIAGNOSIS — I480 Paroxysmal atrial fibrillation: Secondary | ICD-10-CM | POA: Diagnosis not present

## 2022-04-17 DIAGNOSIS — E871 Hypo-osmolality and hyponatremia: Secondary | ICD-10-CM | POA: Diagnosis not present

## 2022-04-17 DIAGNOSIS — M6281 Muscle weakness (generalized): Secondary | ICD-10-CM | POA: Diagnosis not present

## 2022-04-17 DIAGNOSIS — I5023 Acute on chronic systolic (congestive) heart failure: Secondary | ICD-10-CM | POA: Diagnosis not present

## 2022-04-17 DIAGNOSIS — D649 Anemia, unspecified: Secondary | ICD-10-CM | POA: Diagnosis not present

## 2022-04-19 DIAGNOSIS — I4892 Unspecified atrial flutter: Secondary | ICD-10-CM | POA: Diagnosis not present

## 2022-04-19 DIAGNOSIS — Z741 Need for assistance with personal care: Secondary | ICD-10-CM | POA: Diagnosis not present

## 2022-04-19 DIAGNOSIS — I5023 Acute on chronic systolic (congestive) heart failure: Secondary | ICD-10-CM | POA: Diagnosis not present

## 2022-04-19 DIAGNOSIS — I495 Sick sinus syndrome: Secondary | ICD-10-CM | POA: Diagnosis not present

## 2022-04-19 DIAGNOSIS — E039 Hypothyroidism, unspecified: Secondary | ICD-10-CM | POA: Diagnosis not present

## 2022-04-19 DIAGNOSIS — I480 Paroxysmal atrial fibrillation: Secondary | ICD-10-CM | POA: Diagnosis not present

## 2022-04-19 DIAGNOSIS — R262 Difficulty in walking, not elsewhere classified: Secondary | ICD-10-CM | POA: Diagnosis not present

## 2022-04-19 DIAGNOSIS — Z950 Presence of cardiac pacemaker: Secondary | ICD-10-CM | POA: Diagnosis not present

## 2022-04-19 DIAGNOSIS — I130 Hypertensive heart and chronic kidney disease with heart failure and stage 1 through stage 4 chronic kidney disease, or unspecified chronic kidney disease: Secondary | ICD-10-CM | POA: Diagnosis not present

## 2022-04-19 DIAGNOSIS — E871 Hypo-osmolality and hyponatremia: Secondary | ICD-10-CM | POA: Diagnosis not present

## 2022-04-19 DIAGNOSIS — R2681 Unsteadiness on feet: Secondary | ICD-10-CM | POA: Diagnosis not present

## 2022-04-19 DIAGNOSIS — E7800 Pure hypercholesterolemia, unspecified: Secondary | ICD-10-CM | POA: Diagnosis not present

## 2022-04-19 DIAGNOSIS — D649 Anemia, unspecified: Secondary | ICD-10-CM | POA: Diagnosis not present

## 2022-04-19 DIAGNOSIS — R278 Other lack of coordination: Secondary | ICD-10-CM | POA: Diagnosis not present

## 2022-04-19 DIAGNOSIS — I110 Hypertensive heart disease with heart failure: Secondary | ICD-10-CM | POA: Diagnosis not present

## 2022-04-19 DIAGNOSIS — F32A Depression, unspecified: Secondary | ICD-10-CM | POA: Diagnosis not present

## 2022-04-19 DIAGNOSIS — R001 Bradycardia, unspecified: Secondary | ICD-10-CM | POA: Diagnosis not present

## 2022-04-19 DIAGNOSIS — N1831 Chronic kidney disease, stage 3a: Secondary | ICD-10-CM | POA: Diagnosis not present

## 2022-04-19 DIAGNOSIS — M6281 Muscle weakness (generalized): Secondary | ICD-10-CM | POA: Diagnosis not present

## 2022-04-20 DIAGNOSIS — N1831 Chronic kidney disease, stage 3a: Secondary | ICD-10-CM | POA: Diagnosis not present

## 2022-04-20 DIAGNOSIS — Z741 Need for assistance with personal care: Secondary | ICD-10-CM | POA: Diagnosis not present

## 2022-04-20 DIAGNOSIS — R278 Other lack of coordination: Secondary | ICD-10-CM | POA: Diagnosis not present

## 2022-04-20 DIAGNOSIS — R2681 Unsteadiness on feet: Secondary | ICD-10-CM | POA: Diagnosis not present

## 2022-04-20 DIAGNOSIS — I495 Sick sinus syndrome: Secondary | ICD-10-CM | POA: Diagnosis not present

## 2022-04-20 DIAGNOSIS — I130 Hypertensive heart and chronic kidney disease with heart failure and stage 1 through stage 4 chronic kidney disease, or unspecified chronic kidney disease: Secondary | ICD-10-CM | POA: Diagnosis not present

## 2022-04-20 DIAGNOSIS — E871 Hypo-osmolality and hyponatremia: Secondary | ICD-10-CM | POA: Diagnosis not present

## 2022-04-20 DIAGNOSIS — F32A Depression, unspecified: Secondary | ICD-10-CM | POA: Diagnosis not present

## 2022-04-20 DIAGNOSIS — R262 Difficulty in walking, not elsewhere classified: Secondary | ICD-10-CM | POA: Diagnosis not present

## 2022-04-20 DIAGNOSIS — I5023 Acute on chronic systolic (congestive) heart failure: Secondary | ICD-10-CM | POA: Diagnosis not present

## 2022-04-20 DIAGNOSIS — I480 Paroxysmal atrial fibrillation: Secondary | ICD-10-CM | POA: Diagnosis not present

## 2022-04-20 DIAGNOSIS — M6281 Muscle weakness (generalized): Secondary | ICD-10-CM | POA: Diagnosis not present

## 2022-04-20 DIAGNOSIS — E039 Hypothyroidism, unspecified: Secondary | ICD-10-CM | POA: Diagnosis not present

## 2022-04-20 DIAGNOSIS — Z950 Presence of cardiac pacemaker: Secondary | ICD-10-CM | POA: Diagnosis not present

## 2022-04-20 DIAGNOSIS — I110 Hypertensive heart disease with heart failure: Secondary | ICD-10-CM | POA: Diagnosis not present

## 2022-04-20 DIAGNOSIS — D649 Anemia, unspecified: Secondary | ICD-10-CM | POA: Diagnosis not present

## 2022-04-21 DIAGNOSIS — D649 Anemia, unspecified: Secondary | ICD-10-CM | POA: Diagnosis not present

## 2022-04-21 DIAGNOSIS — I5023 Acute on chronic systolic (congestive) heart failure: Secondary | ICD-10-CM | POA: Diagnosis not present

## 2022-04-21 DIAGNOSIS — E871 Hypo-osmolality and hyponatremia: Secondary | ICD-10-CM | POA: Diagnosis not present

## 2022-04-21 DIAGNOSIS — R2681 Unsteadiness on feet: Secondary | ICD-10-CM | POA: Diagnosis not present

## 2022-04-21 DIAGNOSIS — R262 Difficulty in walking, not elsewhere classified: Secondary | ICD-10-CM | POA: Diagnosis not present

## 2022-04-21 DIAGNOSIS — I130 Hypertensive heart and chronic kidney disease with heart failure and stage 1 through stage 4 chronic kidney disease, or unspecified chronic kidney disease: Secondary | ICD-10-CM | POA: Diagnosis not present

## 2022-04-21 DIAGNOSIS — F32A Depression, unspecified: Secondary | ICD-10-CM | POA: Diagnosis not present

## 2022-04-21 DIAGNOSIS — I110 Hypertensive heart disease with heart failure: Secondary | ICD-10-CM | POA: Diagnosis not present

## 2022-04-21 DIAGNOSIS — Z950 Presence of cardiac pacemaker: Secondary | ICD-10-CM | POA: Diagnosis not present

## 2022-04-21 DIAGNOSIS — E039 Hypothyroidism, unspecified: Secondary | ICD-10-CM | POA: Diagnosis not present

## 2022-04-21 DIAGNOSIS — Z741 Need for assistance with personal care: Secondary | ICD-10-CM | POA: Diagnosis not present

## 2022-04-21 DIAGNOSIS — I495 Sick sinus syndrome: Secondary | ICD-10-CM | POA: Diagnosis not present

## 2022-04-21 DIAGNOSIS — R278 Other lack of coordination: Secondary | ICD-10-CM | POA: Diagnosis not present

## 2022-04-21 DIAGNOSIS — I480 Paroxysmal atrial fibrillation: Secondary | ICD-10-CM | POA: Diagnosis not present

## 2022-04-21 DIAGNOSIS — M6281 Muscle weakness (generalized): Secondary | ICD-10-CM | POA: Diagnosis not present

## 2022-04-21 DIAGNOSIS — N1831 Chronic kidney disease, stage 3a: Secondary | ICD-10-CM | POA: Diagnosis not present

## 2022-04-24 DIAGNOSIS — F32A Depression, unspecified: Secondary | ICD-10-CM | POA: Diagnosis not present

## 2022-04-24 DIAGNOSIS — Z741 Need for assistance with personal care: Secondary | ICD-10-CM | POA: Diagnosis not present

## 2022-04-24 DIAGNOSIS — E871 Hypo-osmolality and hyponatremia: Secondary | ICD-10-CM | POA: Diagnosis not present

## 2022-04-24 DIAGNOSIS — I110 Hypertensive heart disease with heart failure: Secondary | ICD-10-CM | POA: Diagnosis not present

## 2022-04-24 DIAGNOSIS — R2681 Unsteadiness on feet: Secondary | ICD-10-CM | POA: Diagnosis not present

## 2022-04-24 DIAGNOSIS — N1831 Chronic kidney disease, stage 3a: Secondary | ICD-10-CM | POA: Diagnosis not present

## 2022-04-24 DIAGNOSIS — I495 Sick sinus syndrome: Secondary | ICD-10-CM | POA: Diagnosis not present

## 2022-04-24 DIAGNOSIS — I480 Paroxysmal atrial fibrillation: Secondary | ICD-10-CM | POA: Diagnosis not present

## 2022-04-24 DIAGNOSIS — I5023 Acute on chronic systolic (congestive) heart failure: Secondary | ICD-10-CM | POA: Diagnosis not present

## 2022-04-24 DIAGNOSIS — R278 Other lack of coordination: Secondary | ICD-10-CM | POA: Diagnosis not present

## 2022-04-24 DIAGNOSIS — M6281 Muscle weakness (generalized): Secondary | ICD-10-CM | POA: Diagnosis not present

## 2022-04-24 DIAGNOSIS — R262 Difficulty in walking, not elsewhere classified: Secondary | ICD-10-CM | POA: Diagnosis not present

## 2022-04-24 DIAGNOSIS — E039 Hypothyroidism, unspecified: Secondary | ICD-10-CM | POA: Diagnosis not present

## 2022-04-24 DIAGNOSIS — I130 Hypertensive heart and chronic kidney disease with heart failure and stage 1 through stage 4 chronic kidney disease, or unspecified chronic kidney disease: Secondary | ICD-10-CM | POA: Diagnosis not present

## 2022-04-24 DIAGNOSIS — D649 Anemia, unspecified: Secondary | ICD-10-CM | POA: Diagnosis not present

## 2022-04-24 DIAGNOSIS — Z950 Presence of cardiac pacemaker: Secondary | ICD-10-CM | POA: Diagnosis not present

## 2022-04-25 DIAGNOSIS — M6281 Muscle weakness (generalized): Secondary | ICD-10-CM | POA: Diagnosis not present

## 2022-04-25 DIAGNOSIS — F32A Depression, unspecified: Secondary | ICD-10-CM | POA: Diagnosis not present

## 2022-04-25 DIAGNOSIS — Z950 Presence of cardiac pacemaker: Secondary | ICD-10-CM | POA: Diagnosis not present

## 2022-04-25 DIAGNOSIS — I480 Paroxysmal atrial fibrillation: Secondary | ICD-10-CM | POA: Diagnosis not present

## 2022-04-25 DIAGNOSIS — I110 Hypertensive heart disease with heart failure: Secondary | ICD-10-CM | POA: Diagnosis not present

## 2022-04-25 DIAGNOSIS — R262 Difficulty in walking, not elsewhere classified: Secondary | ICD-10-CM | POA: Diagnosis not present

## 2022-04-25 DIAGNOSIS — D649 Anemia, unspecified: Secondary | ICD-10-CM | POA: Diagnosis not present

## 2022-04-25 DIAGNOSIS — R2681 Unsteadiness on feet: Secondary | ICD-10-CM | POA: Diagnosis not present

## 2022-04-25 DIAGNOSIS — R278 Other lack of coordination: Secondary | ICD-10-CM | POA: Diagnosis not present

## 2022-04-25 DIAGNOSIS — Z741 Need for assistance with personal care: Secondary | ICD-10-CM | POA: Diagnosis not present

## 2022-04-25 DIAGNOSIS — E871 Hypo-osmolality and hyponatremia: Secondary | ICD-10-CM | POA: Diagnosis not present

## 2022-04-25 DIAGNOSIS — E039 Hypothyroidism, unspecified: Secondary | ICD-10-CM | POA: Diagnosis not present

## 2022-04-25 DIAGNOSIS — I130 Hypertensive heart and chronic kidney disease with heart failure and stage 1 through stage 4 chronic kidney disease, or unspecified chronic kidney disease: Secondary | ICD-10-CM | POA: Diagnosis not present

## 2022-04-25 DIAGNOSIS — N1831 Chronic kidney disease, stage 3a: Secondary | ICD-10-CM | POA: Diagnosis not present

## 2022-04-25 DIAGNOSIS — I5023 Acute on chronic systolic (congestive) heart failure: Secondary | ICD-10-CM | POA: Diagnosis not present

## 2022-04-25 DIAGNOSIS — I495 Sick sinus syndrome: Secondary | ICD-10-CM | POA: Diagnosis not present

## 2022-04-26 DIAGNOSIS — I130 Hypertensive heart and chronic kidney disease with heart failure and stage 1 through stage 4 chronic kidney disease, or unspecified chronic kidney disease: Secondary | ICD-10-CM | POA: Diagnosis not present

## 2022-04-26 DIAGNOSIS — R278 Other lack of coordination: Secondary | ICD-10-CM | POA: Diagnosis not present

## 2022-04-26 DIAGNOSIS — R2681 Unsteadiness on feet: Secondary | ICD-10-CM | POA: Diagnosis not present

## 2022-04-26 DIAGNOSIS — N1831 Chronic kidney disease, stage 3a: Secondary | ICD-10-CM | POA: Diagnosis not present

## 2022-04-26 DIAGNOSIS — I5023 Acute on chronic systolic (congestive) heart failure: Secondary | ICD-10-CM | POA: Diagnosis not present

## 2022-04-26 DIAGNOSIS — I480 Paroxysmal atrial fibrillation: Secondary | ICD-10-CM | POA: Diagnosis not present

## 2022-04-26 DIAGNOSIS — I509 Heart failure, unspecified: Secondary | ICD-10-CM | POA: Diagnosis not present

## 2022-04-26 DIAGNOSIS — D649 Anemia, unspecified: Secondary | ICD-10-CM | POA: Diagnosis not present

## 2022-04-26 DIAGNOSIS — F32A Depression, unspecified: Secondary | ICD-10-CM | POA: Diagnosis not present

## 2022-04-26 DIAGNOSIS — Z950 Presence of cardiac pacemaker: Secondary | ICD-10-CM | POA: Diagnosis not present

## 2022-04-26 DIAGNOSIS — I110 Hypertensive heart disease with heart failure: Secondary | ICD-10-CM | POA: Diagnosis not present

## 2022-04-26 DIAGNOSIS — E875 Hyperkalemia: Secondary | ICD-10-CM | POA: Diagnosis not present

## 2022-04-26 DIAGNOSIS — I495 Sick sinus syndrome: Secondary | ICD-10-CM | POA: Diagnosis not present

## 2022-04-26 DIAGNOSIS — M6281 Muscle weakness (generalized): Secondary | ICD-10-CM | POA: Diagnosis not present

## 2022-04-26 DIAGNOSIS — R5383 Other fatigue: Secondary | ICD-10-CM | POA: Diagnosis not present

## 2022-04-26 DIAGNOSIS — Z741 Need for assistance with personal care: Secondary | ICD-10-CM | POA: Diagnosis not present

## 2022-04-26 DIAGNOSIS — R262 Difficulty in walking, not elsewhere classified: Secondary | ICD-10-CM | POA: Diagnosis not present

## 2022-04-26 DIAGNOSIS — Z79899 Other long term (current) drug therapy: Secondary | ICD-10-CM | POA: Diagnosis not present

## 2022-04-26 DIAGNOSIS — E871 Hypo-osmolality and hyponatremia: Secondary | ICD-10-CM | POA: Diagnosis not present

## 2022-04-26 DIAGNOSIS — E039 Hypothyroidism, unspecified: Secondary | ICD-10-CM | POA: Diagnosis not present

## 2022-04-26 LAB — CBC AND DIFFERENTIAL
HCT: 38 — AB (ref 41–53)
Hemoglobin: 12.6 — AB (ref 13.5–17.5)
Neutrophils Absolute: 3240
Platelets: 262 10*3/uL (ref 150–400)
WBC: 5

## 2022-04-26 LAB — BASIC METABOLIC PANEL
BUN: 43 — AB (ref 4–21)
CO2: 26 — AB (ref 13–22)
Chloride: 102 (ref 99–108)
Creatinine: 1.8 — AB (ref 0.6–1.3)
Glucose: 80
Potassium: 5 mEq/L (ref 3.5–5.1)
Sodium: 136 — AB (ref 137–147)

## 2022-04-26 LAB — CBC: RBC: 4.28 (ref 3.87–5.11)

## 2022-04-26 LAB — COMPREHENSIVE METABOLIC PANEL
Calcium: 8.6 — AB (ref 8.7–10.7)
eGFR: 34

## 2022-04-27 DIAGNOSIS — I495 Sick sinus syndrome: Secondary | ICD-10-CM | POA: Diagnosis not present

## 2022-04-27 DIAGNOSIS — N1831 Chronic kidney disease, stage 3a: Secondary | ICD-10-CM | POA: Diagnosis not present

## 2022-04-27 DIAGNOSIS — I110 Hypertensive heart disease with heart failure: Secondary | ICD-10-CM | POA: Diagnosis not present

## 2022-04-27 DIAGNOSIS — I130 Hypertensive heart and chronic kidney disease with heart failure and stage 1 through stage 4 chronic kidney disease, or unspecified chronic kidney disease: Secondary | ICD-10-CM | POA: Diagnosis not present

## 2022-04-27 DIAGNOSIS — R278 Other lack of coordination: Secondary | ICD-10-CM | POA: Diagnosis not present

## 2022-04-27 DIAGNOSIS — R2681 Unsteadiness on feet: Secondary | ICD-10-CM | POA: Diagnosis not present

## 2022-04-27 DIAGNOSIS — D649 Anemia, unspecified: Secondary | ICD-10-CM | POA: Diagnosis not present

## 2022-04-27 DIAGNOSIS — E039 Hypothyroidism, unspecified: Secondary | ICD-10-CM | POA: Diagnosis not present

## 2022-04-27 DIAGNOSIS — E871 Hypo-osmolality and hyponatremia: Secondary | ICD-10-CM | POA: Diagnosis not present

## 2022-04-27 DIAGNOSIS — I5023 Acute on chronic systolic (congestive) heart failure: Secondary | ICD-10-CM | POA: Diagnosis not present

## 2022-04-27 DIAGNOSIS — R262 Difficulty in walking, not elsewhere classified: Secondary | ICD-10-CM | POA: Diagnosis not present

## 2022-04-27 DIAGNOSIS — Z741 Need for assistance with personal care: Secondary | ICD-10-CM | POA: Diagnosis not present

## 2022-04-27 DIAGNOSIS — F32A Depression, unspecified: Secondary | ICD-10-CM | POA: Diagnosis not present

## 2022-04-27 DIAGNOSIS — I480 Paroxysmal atrial fibrillation: Secondary | ICD-10-CM | POA: Diagnosis not present

## 2022-04-27 DIAGNOSIS — M6281 Muscle weakness (generalized): Secondary | ICD-10-CM | POA: Diagnosis not present

## 2022-04-27 DIAGNOSIS — Z950 Presence of cardiac pacemaker: Secondary | ICD-10-CM | POA: Diagnosis not present

## 2022-04-28 DIAGNOSIS — N1831 Chronic kidney disease, stage 3a: Secondary | ICD-10-CM | POA: Diagnosis not present

## 2022-04-28 DIAGNOSIS — I5023 Acute on chronic systolic (congestive) heart failure: Secondary | ICD-10-CM | POA: Diagnosis not present

## 2022-04-28 DIAGNOSIS — I480 Paroxysmal atrial fibrillation: Secondary | ICD-10-CM | POA: Diagnosis not present

## 2022-04-28 DIAGNOSIS — E039 Hypothyroidism, unspecified: Secondary | ICD-10-CM | POA: Diagnosis not present

## 2022-04-28 DIAGNOSIS — E871 Hypo-osmolality and hyponatremia: Secondary | ICD-10-CM | POA: Diagnosis not present

## 2022-04-28 DIAGNOSIS — I130 Hypertensive heart and chronic kidney disease with heart failure and stage 1 through stage 4 chronic kidney disease, or unspecified chronic kidney disease: Secondary | ICD-10-CM | POA: Diagnosis not present

## 2022-04-28 DIAGNOSIS — D649 Anemia, unspecified: Secondary | ICD-10-CM | POA: Diagnosis not present

## 2022-04-28 DIAGNOSIS — F32A Depression, unspecified: Secondary | ICD-10-CM | POA: Diagnosis not present

## 2022-04-28 DIAGNOSIS — R2681 Unsteadiness on feet: Secondary | ICD-10-CM | POA: Diagnosis not present

## 2022-04-28 DIAGNOSIS — Z741 Need for assistance with personal care: Secondary | ICD-10-CM | POA: Diagnosis not present

## 2022-04-28 DIAGNOSIS — R262 Difficulty in walking, not elsewhere classified: Secondary | ICD-10-CM | POA: Diagnosis not present

## 2022-04-28 DIAGNOSIS — R278 Other lack of coordination: Secondary | ICD-10-CM | POA: Diagnosis not present

## 2022-04-28 DIAGNOSIS — I495 Sick sinus syndrome: Secondary | ICD-10-CM | POA: Diagnosis not present

## 2022-04-28 DIAGNOSIS — I110 Hypertensive heart disease with heart failure: Secondary | ICD-10-CM | POA: Diagnosis not present

## 2022-04-28 DIAGNOSIS — M6281 Muscle weakness (generalized): Secondary | ICD-10-CM | POA: Diagnosis not present

## 2022-04-28 DIAGNOSIS — Z950 Presence of cardiac pacemaker: Secondary | ICD-10-CM | POA: Diagnosis not present

## 2022-05-02 DIAGNOSIS — I495 Sick sinus syndrome: Secondary | ICD-10-CM | POA: Diagnosis not present

## 2022-05-03 DIAGNOSIS — I480 Paroxysmal atrial fibrillation: Secondary | ICD-10-CM | POA: Diagnosis not present

## 2022-05-03 DIAGNOSIS — M6281 Muscle weakness (generalized): Secondary | ICD-10-CM | POA: Diagnosis not present

## 2022-05-03 DIAGNOSIS — Z741 Need for assistance with personal care: Secondary | ICD-10-CM | POA: Diagnosis not present

## 2022-05-03 DIAGNOSIS — E871 Hypo-osmolality and hyponatremia: Secondary | ICD-10-CM | POA: Diagnosis not present

## 2022-05-03 DIAGNOSIS — N1831 Chronic kidney disease, stage 3a: Secondary | ICD-10-CM | POA: Diagnosis not present

## 2022-05-03 DIAGNOSIS — R262 Difficulty in walking, not elsewhere classified: Secondary | ICD-10-CM | POA: Diagnosis not present

## 2022-05-03 DIAGNOSIS — I495 Sick sinus syndrome: Secondary | ICD-10-CM | POA: Diagnosis not present

## 2022-05-03 DIAGNOSIS — I130 Hypertensive heart and chronic kidney disease with heart failure and stage 1 through stage 4 chronic kidney disease, or unspecified chronic kidney disease: Secondary | ICD-10-CM | POA: Diagnosis not present

## 2022-05-03 DIAGNOSIS — R278 Other lack of coordination: Secondary | ICD-10-CM | POA: Diagnosis not present

## 2022-05-03 DIAGNOSIS — D649 Anemia, unspecified: Secondary | ICD-10-CM | POA: Diagnosis not present

## 2022-05-03 DIAGNOSIS — F32A Depression, unspecified: Secondary | ICD-10-CM | POA: Diagnosis not present

## 2022-05-03 DIAGNOSIS — I5023 Acute on chronic systolic (congestive) heart failure: Secondary | ICD-10-CM | POA: Diagnosis not present

## 2022-05-03 DIAGNOSIS — I110 Hypertensive heart disease with heart failure: Secondary | ICD-10-CM | POA: Diagnosis not present

## 2022-05-03 DIAGNOSIS — Z950 Presence of cardiac pacemaker: Secondary | ICD-10-CM | POA: Diagnosis not present

## 2022-05-03 DIAGNOSIS — R2681 Unsteadiness on feet: Secondary | ICD-10-CM | POA: Diagnosis not present

## 2022-05-03 DIAGNOSIS — E039 Hypothyroidism, unspecified: Secondary | ICD-10-CM | POA: Diagnosis not present

## 2022-05-04 ENCOUNTER — Non-Acute Institutional Stay (SKILLED_NURSING_FACILITY): Payer: Medicare Other | Admitting: Student

## 2022-05-04 ENCOUNTER — Encounter: Payer: Self-pay | Admitting: Student

## 2022-05-04 DIAGNOSIS — I480 Paroxysmal atrial fibrillation: Secondary | ICD-10-CM | POA: Diagnosis not present

## 2022-05-04 DIAGNOSIS — I130 Hypertensive heart and chronic kidney disease with heart failure and stage 1 through stage 4 chronic kidney disease, or unspecified chronic kidney disease: Secondary | ICD-10-CM | POA: Diagnosis not present

## 2022-05-04 DIAGNOSIS — E871 Hypo-osmolality and hyponatremia: Secondary | ICD-10-CM | POA: Diagnosis not present

## 2022-05-04 DIAGNOSIS — E039 Hypothyroidism, unspecified: Secondary | ICD-10-CM | POA: Diagnosis not present

## 2022-05-04 DIAGNOSIS — I509 Heart failure, unspecified: Secondary | ICD-10-CM

## 2022-05-04 DIAGNOSIS — I5023 Acute on chronic systolic (congestive) heart failure: Secondary | ICD-10-CM | POA: Diagnosis not present

## 2022-05-04 DIAGNOSIS — R296 Repeated falls: Secondary | ICD-10-CM

## 2022-05-04 DIAGNOSIS — I495 Sick sinus syndrome: Secondary | ICD-10-CM | POA: Diagnosis not present

## 2022-05-04 DIAGNOSIS — N1831 Chronic kidney disease, stage 3a: Secondary | ICD-10-CM | POA: Diagnosis not present

## 2022-05-04 DIAGNOSIS — Z741 Need for assistance with personal care: Secondary | ICD-10-CM | POA: Diagnosis not present

## 2022-05-04 DIAGNOSIS — I110 Hypertensive heart disease with heart failure: Secondary | ICD-10-CM | POA: Diagnosis not present

## 2022-05-04 DIAGNOSIS — F32A Depression, unspecified: Secondary | ICD-10-CM | POA: Diagnosis not present

## 2022-05-04 DIAGNOSIS — Z950 Presence of cardiac pacemaker: Secondary | ICD-10-CM | POA: Diagnosis not present

## 2022-05-04 DIAGNOSIS — R2681 Unsteadiness on feet: Secondary | ICD-10-CM | POA: Diagnosis not present

## 2022-05-04 DIAGNOSIS — R262 Difficulty in walking, not elsewhere classified: Secondary | ICD-10-CM | POA: Diagnosis not present

## 2022-05-04 DIAGNOSIS — M6281 Muscle weakness (generalized): Secondary | ICD-10-CM | POA: Diagnosis not present

## 2022-05-04 DIAGNOSIS — I951 Orthostatic hypotension: Secondary | ICD-10-CM | POA: Diagnosis not present

## 2022-05-04 DIAGNOSIS — R278 Other lack of coordination: Secondary | ICD-10-CM | POA: Diagnosis not present

## 2022-05-04 DIAGNOSIS — D649 Anemia, unspecified: Secondary | ICD-10-CM | POA: Diagnosis not present

## 2022-05-04 NOTE — Progress Notes (Signed)
Location:  [facility]  Nursing Home Room Number: [employer] Service:  SNF [phone]) Provider:  Dr. Amada Kingfisher, MD  Patient Care Team: Dewayne Shorter, MD as PCP - General (Family Medicine) Lauree Chandler, NP as Nurse Practitioner (Geriatric Medicine)  Extended Emergency Contact Information Primary Emergency Contact: [facility] Address: [place] [address] [name] of [place] Phone: [phone] Mobile Phone: [phone] Relation: Son Secondary Emergency Contact: Leanora Ivanoff States of [place] Phone: [phone] Mobile Phone: [phone] Relation: Son  Code Status:  DNR Goals of care: Advanced Directive information    03/27/2022    9:41 AM  Advanced Directives  Does Patient Have a Medical Advance Directive? Yes  Type of Advance Directive Out of facility DNR (pink MOST or yellow form);Living will  Does patient want to make changes to medical advance directive? No - Patient declined  Pre-existing out of facility DNR order (yellow form or pink MOST form) Yellow form placed in chart (order not valid for inpatient use);Pink MOST form placed in chart (order not valid for inpatient use)     Chief Complaint  Patient presents with   Acute Visit    Cough and Fall.     HPI:  Frank Bartlett is a 88 y.o. male seen today for an acute visit for   That shouldn't be categorized as a fall - he had the chair too high and he slipped. He sat on the floor. NO pains. Denies dizziness when going from sitting to standing. He goes sit to stand 10x and then rests and has no issues with dizziness.  Denies chest pain or shortness of breath. Everyone has a cough. He has had some runny nose -- continually.   Spoke with nursing and physical therapy who states the patient has denied dizziness with standing, however have had significant changes in blood pressure.  Orthostatic vital signs They got the sitting  and standing ones 112/72 sitting and 80/53 standing. I  Past Medical History:  Diagnosis Date   AAA (abdominal aortic aneurysm) ([place])    Actinic keratosis 07/22/2017   left lateral crown, midline crown, right of midline crown   Anemia    Aortic atherosclerosis (HCC)    Atrial fibrillation ([place]) 03/2016   brief spell   B12 deficiency    Basal cell carcinoma 10/30/2016   L lat crown   Bradycardia    Bradycardia 02/2017   Pacer placed   CHF (congestive heart failure) (HCC)    Chronic kidney disease (CKD), stage III (moderate) (HCC)    Depression    Hyperlipidemia    Hypertension    Hyponatremia    Hypothyroidism    Osteoarthritis    Prostate cancer ([place]) 2001   Rad tx's + seed implants   Renal cancer, left ([place]) 03/2016   Left Renal Nephrectomy   Sick sinus syndrome ([place])    Pacemaker   Past Surgical History:  Procedure Laterality Date   CATARACT EXTRACTION, BILATERAL     COLONOSCOPY     EXCISIONAL HEMORRHOIDECTOMY     INSERTION PROSTATE RADIATION SEED     LAPAROSCOPIC NEPHRECTOMY, HAND ASSISTED Left 03/08/2016   Procedure: HAND ASSISTED LAPAROSCOPIC NEPHRECTOMY;  Surgeon: Nickie Retort, MD;  Location: [facility] ORS;  Service: Urology;  Laterality: Left;   LAPAROTOMY N/A 03/13/2016   Procedure: EXPLORATORY LAPAROTOMY;  Surgeon: Festus Aloe, MD;  Location: [facility] ORS;  Service: Urology;  Laterality:  N/A;   PACEMAKER INSERTION N/A 02/20/2017   Procedure: INSERTION PACEMAKER;  Surgeon: Isaias Cowman, MD;  Location: [facility] ORS;  Service: Cardiovascular;  Laterality: N/A;    No Known Allergies  Outpatient Encounter Medications as of 05/04/2022  Medication Sig   acetaminophen (TYLENOL) 500 MG tablet Take 1,000 mg by mouth at bedtime.   acetaminophen (TYLENOL) 650 MG CR tablet Take 650 mg by mouth every 4 (four) hours as needed for pain.   Bismuth Subsalicylate (KAOPECTATE PO) Take 10 mLs by mouth daily as needed. For diarrhea   carbamide peroxide (DEBROX) 6.5 % OTIC  solution Place 5 drops into both ears as needed.   collagenase (SANTYL) 250 UNIT/GM ointment Apply to Bilateral hells topically every day shift for wound healing. Apply to bilateral heels 38m thickness then apply calcium alginate and cover with foam dressing.   Cyanocobalamin 1000 MCG TBCR Take 1,000 mcg by mouth daily.    dapagliflozin propanediol (FARXIGA) 5 MG TABS tablet Take 1 tablet (5 mg total) by mouth daily.   dextromethorphan-guaiFENesin (ROBITUSSIN-DM) 10-100 MG/5ML liquid Take 10 mLs by mouth every 4 (four) hours as needed for cough.   ELIQUIS 2.5 MG TABS tablet Take 2.5 mg by mouth 2 (two) times daily.   Ensure (ENSURE) Take 237 mLs by mouth 3 (three) times daily between meals.   ferrous sulfate 325 (65 FE) MG tablet Take 1 tablet (325 mg total) by mouth daily with breakfast.   levothyroxine (SYNTHROID, LEVOTHROID) 112 MCG tablet Take 112 mcg by mouth daily before breakfast.   magnesium hydroxide (MILK OF MAGNESIA) 400 MG/5ML suspension Take 30 mLs by mouth daily as needed for mild constipation.   Multiple Vitamin (MULTI-VITAMINS) TABS Take 1 tablet by mouth daily.   mupirocin ointment (BACTROBAN) 2 % 1 Application daily. Apply to scalp daily   nystatin (MYCOSTATIN/NYSTOP) powder Apply 1 Application topically as needed.   ondansetron (ZOFRAN) 4 MG tablet Take 4 mg by mouth as needed for nausea or vomiting. Up to three times a day   sertraline (ZOLOFT) 100 MG tablet Take 100 mg by mouth daily.   torsemide (DEMADEX) 20 MG tablet Take 20 mg by mouth daily.   witch hazel-glycerin (TUCKS) pad Apply topically as needed for itching.   Zinc Oxide (DESITIN) 13 % CREA Apply topically. Apply to rectum topically ever 2 hours  needed   [DISCONTINUED] sertraline (ZOLOFT) 50 MG tablet Take 50 mg by mouth daily.   No facility-administered encounter medications on file as of 05/04/2022.    Review of Systems  All other systems reviewed and are negative.   Immunization History  Administered  Date(s) Administered   Influenza, High Dose Seasonal PF 01/25/2017, 03/04/2018, 02/21/2022   Influenza-Unspecified 03/14/2015, 02/21/2021   Moderna Sars-Covid-2 Vaccination 05/18/2019, 06/15/2019, 03/16/2022   Pneumococcal Conjugate-13 07/09/2014, 12/05/2015   Pneumococcal Polysaccharide-23 12/04/2016   Tdap 04/12/2018   Unspecified SARS-COV-2 Vaccination 10/03/2021   Pertinent  Health Maintenance Due  Topic Date Due   INFLUENZA VACCINE  Completed      02/26/2022    9:00 PM 02/27/2022    7:46 PM 02/28/2022    2:00 PM 02/28/2022    7:53 PM 03/01/2022    8:30 AM  Fall Risk  Patient Fall Risk Level High fall risk High fall risk High fall risk High fall risk High fall risk   Functional Status Survey:    Vitals:   05/04/22 1039  BP: (!) 90/58  Pulse: 88  Resp: 18  Temp: (!) 97.3 F (36.3 C)  SpO2: 94%  Weight: 152 lb 6.4 oz (69.1 kg)  Height: '5\' 9"'$  (1.753 m)   Body mass index is 22.51 kg/m. Physical Exam Vitals reviewed.  Cardiovascular:     Rate and Rhythm: Normal rate.     Pulses: Normal pulses.  Pulmonary:     Effort: Pulmonary effort is normal.  Abdominal:     General: Bowel sounds are normal.     Palpations: Abdomen is soft.  Skin:    Capillary Refill: Capillary refill takes less than 2 seconds.  Neurological:     Mental Status: He is alert and oriented to person, place, and time.    Labs reviewed: Recent Labs    02/23/22 0715 02/23/22 1359 02/25/22 0542 02/26/22 0653 02/28/22 0433 03/01/22 0625 03/26/22 0000 04/26/22 0000  NA 126*   < > 129* 128* 127* 129* 140 136*  K 4.1   < > 3.5 3.3* 3.7 3.3* 4.5 5.0  CL 96*   < > 99 94* 91* 90* 108 102  CO2 21*   < > '23 24 26 26 '$ 23* 26*  GLUCOSE 101*   < > 109* 102* 100* 99  --   --   BUN 22   < > 20 18 28* 35* 39* 43*  CREATININE 1.15   < > 1.20 1.30* 1.37* 1.49* 1.4* 1.8*  CALCIUM 8.9   < > 8.2* 8.3* 8.0* 8.3* 8.7 8.6*  MG 2.0   < > 1.9 1.8 1.8  --   --   --   PHOS 3.4  --   --   --   --   --   --   --     < > = values in this interval not displayed.   Recent Labs    02/23/22 0715 03/26/22 0000  AST 31 12*  ALT 29 12  ALKPHOS 90 74  BILITOT 0.9  --   PROT 7.2  --   ALBUMIN 3.7 6.0*   Recent Labs    02/24/22 0829 02/25/22 0542 02/26/22 0653 02/28/22 0433 03/26/22 0000 04/26/22 0000  WBC 6.3 4.8 4.9 5.8 4.9 5.0  NEUTROABS 5.0  --   --   --  3,023.00 3,240.00  HGB 10.3* 9.4* 10.0* 9.7* 12.5* 12.6*  HCT 31.4* 27.7* 29.3* 28.9* 37* 38*  MCV 86.5 84.2 85.2 85.5  --   --   PLT 250 237 234 226 203 262   Lab Results  Component Value Date   TSH 8.962 (H) 02/23/2022   No results found for: "HGBA1C" No results found for: "CHOL", "HDL", "LDLCALC", "LDLDIRECT", "TRIG", "CHOLHDL"  Significant Diagnostic Results in last 30 days:  No results found.  Assessment/Plan Orthostatic hypotension  Frequent falls  Paroxysmal atrial fibrillation (HCC)  Sick sinus syndrome (HCC)  Chronic congestive heart failure, unspecified heart failure type ([place])  Stage 3a chronic kidney disease ([place]) Patient with history of frequent falls most recently "slid out of chair" earlier this week.  Concern that orthostatic hypotension is contributing to current falls.  Reviewed medication and found that diabetes medication could be contributing to his falls will to discontinue at this time.  Patient with history of atrial fibrillation, however rate well-controlled.  History of CHF which could lead to lower blood pressures, however no longer on diuretic at this time.  Orthostatic positive on exam today.  Discussed concern for contributing to recurrent falls, will trial low-dose of midodrine 2.5 mg twice daily.  Given patient's cardiac history discussed concern for potential side effects however patient  is in agreement with trial of this medication to see if this could help.  Will plan to check blood pressure daily for the next 7 days and reevaluate at that time.  Family/ staff Communication: Patient  Nursing  Labs/tests ordered:  none  I spent greater than 30 minutes for the care of this patient, chart review, documentation, medication review, patient education.   Tomasa Rand, MD, [facility] [phone]

## 2022-05-05 DIAGNOSIS — D649 Anemia, unspecified: Secondary | ICD-10-CM | POA: Diagnosis not present

## 2022-05-05 DIAGNOSIS — Z741 Need for assistance with personal care: Secondary | ICD-10-CM | POA: Diagnosis not present

## 2022-05-05 DIAGNOSIS — I110 Hypertensive heart disease with heart failure: Secondary | ICD-10-CM | POA: Diagnosis not present

## 2022-05-05 DIAGNOSIS — Z950 Presence of cardiac pacemaker: Secondary | ICD-10-CM | POA: Diagnosis not present

## 2022-05-05 DIAGNOSIS — E871 Hypo-osmolality and hyponatremia: Secondary | ICD-10-CM | POA: Diagnosis not present

## 2022-05-05 DIAGNOSIS — F32A Depression, unspecified: Secondary | ICD-10-CM | POA: Diagnosis not present

## 2022-05-05 DIAGNOSIS — E039 Hypothyroidism, unspecified: Secondary | ICD-10-CM | POA: Diagnosis not present

## 2022-05-05 DIAGNOSIS — R262 Difficulty in walking, not elsewhere classified: Secondary | ICD-10-CM | POA: Diagnosis not present

## 2022-05-05 DIAGNOSIS — M6281 Muscle weakness (generalized): Secondary | ICD-10-CM | POA: Diagnosis not present

## 2022-05-05 DIAGNOSIS — I480 Paroxysmal atrial fibrillation: Secondary | ICD-10-CM | POA: Diagnosis not present

## 2022-05-05 DIAGNOSIS — I130 Hypertensive heart and chronic kidney disease with heart failure and stage 1 through stage 4 chronic kidney disease, or unspecified chronic kidney disease: Secondary | ICD-10-CM | POA: Diagnosis not present

## 2022-05-05 DIAGNOSIS — I495 Sick sinus syndrome: Secondary | ICD-10-CM | POA: Diagnosis not present

## 2022-05-05 DIAGNOSIS — N1831 Chronic kidney disease, stage 3a: Secondary | ICD-10-CM | POA: Diagnosis not present

## 2022-05-05 DIAGNOSIS — R2681 Unsteadiness on feet: Secondary | ICD-10-CM | POA: Diagnosis not present

## 2022-05-05 DIAGNOSIS — I5023 Acute on chronic systolic (congestive) heart failure: Secondary | ICD-10-CM | POA: Diagnosis not present

## 2022-05-05 DIAGNOSIS — R278 Other lack of coordination: Secondary | ICD-10-CM | POA: Diagnosis not present

## 2022-05-09 DIAGNOSIS — Z741 Need for assistance with personal care: Secondary | ICD-10-CM | POA: Diagnosis not present

## 2022-05-09 DIAGNOSIS — I130 Hypertensive heart and chronic kidney disease with heart failure and stage 1 through stage 4 chronic kidney disease, or unspecified chronic kidney disease: Secondary | ICD-10-CM | POA: Diagnosis not present

## 2022-05-09 DIAGNOSIS — B351 Tinea unguium: Secondary | ICD-10-CM | POA: Diagnosis not present

## 2022-05-09 DIAGNOSIS — F32A Depression, unspecified: Secondary | ICD-10-CM | POA: Diagnosis not present

## 2022-05-09 DIAGNOSIS — R2681 Unsteadiness on feet: Secondary | ICD-10-CM | POA: Diagnosis not present

## 2022-05-09 DIAGNOSIS — M6281 Muscle weakness (generalized): Secondary | ICD-10-CM | POA: Diagnosis not present

## 2022-05-09 DIAGNOSIS — E871 Hypo-osmolality and hyponatremia: Secondary | ICD-10-CM | POA: Diagnosis not present

## 2022-05-09 DIAGNOSIS — R278 Other lack of coordination: Secondary | ICD-10-CM | POA: Diagnosis not present

## 2022-05-09 DIAGNOSIS — E039 Hypothyroidism, unspecified: Secondary | ICD-10-CM | POA: Diagnosis not present

## 2022-05-09 DIAGNOSIS — D649 Anemia, unspecified: Secondary | ICD-10-CM | POA: Diagnosis not present

## 2022-05-09 DIAGNOSIS — I495 Sick sinus syndrome: Secondary | ICD-10-CM | POA: Diagnosis not present

## 2022-05-09 DIAGNOSIS — R262 Difficulty in walking, not elsewhere classified: Secondary | ICD-10-CM | POA: Diagnosis not present

## 2022-05-09 DIAGNOSIS — I480 Paroxysmal atrial fibrillation: Secondary | ICD-10-CM | POA: Diagnosis not present

## 2022-05-09 DIAGNOSIS — N1831 Chronic kidney disease, stage 3a: Secondary | ICD-10-CM | POA: Diagnosis not present

## 2022-05-09 DIAGNOSIS — I110 Hypertensive heart disease with heart failure: Secondary | ICD-10-CM | POA: Diagnosis not present

## 2022-05-09 DIAGNOSIS — I7091 Generalized atherosclerosis: Secondary | ICD-10-CM | POA: Diagnosis not present

## 2022-05-09 DIAGNOSIS — I5023 Acute on chronic systolic (congestive) heart failure: Secondary | ICD-10-CM | POA: Diagnosis not present

## 2022-05-09 DIAGNOSIS — Z950 Presence of cardiac pacemaker: Secondary | ICD-10-CM | POA: Diagnosis not present

## 2022-05-10 DIAGNOSIS — R6889 Other general symptoms and signs: Secondary | ICD-10-CM | POA: Diagnosis not present

## 2022-05-11 DIAGNOSIS — F32A Depression, unspecified: Secondary | ICD-10-CM | POA: Diagnosis not present

## 2022-05-11 DIAGNOSIS — I130 Hypertensive heart and chronic kidney disease with heart failure and stage 1 through stage 4 chronic kidney disease, or unspecified chronic kidney disease: Secondary | ICD-10-CM | POA: Diagnosis not present

## 2022-05-11 DIAGNOSIS — M6281 Muscle weakness (generalized): Secondary | ICD-10-CM | POA: Diagnosis not present

## 2022-05-11 DIAGNOSIS — I480 Paroxysmal atrial fibrillation: Secondary | ICD-10-CM | POA: Diagnosis not present

## 2022-05-11 DIAGNOSIS — D649 Anemia, unspecified: Secondary | ICD-10-CM | POA: Diagnosis not present

## 2022-05-11 DIAGNOSIS — R2681 Unsteadiness on feet: Secondary | ICD-10-CM | POA: Diagnosis not present

## 2022-05-11 DIAGNOSIS — E039 Hypothyroidism, unspecified: Secondary | ICD-10-CM | POA: Diagnosis not present

## 2022-05-11 DIAGNOSIS — R262 Difficulty in walking, not elsewhere classified: Secondary | ICD-10-CM | POA: Diagnosis not present

## 2022-05-11 DIAGNOSIS — I5023 Acute on chronic systolic (congestive) heart failure: Secondary | ICD-10-CM | POA: Diagnosis not present

## 2022-05-11 DIAGNOSIS — N1831 Chronic kidney disease, stage 3a: Secondary | ICD-10-CM | POA: Diagnosis not present

## 2022-05-11 DIAGNOSIS — I495 Sick sinus syndrome: Secondary | ICD-10-CM | POA: Diagnosis not present

## 2022-05-11 DIAGNOSIS — Z741 Need for assistance with personal care: Secondary | ICD-10-CM | POA: Diagnosis not present

## 2022-05-11 DIAGNOSIS — Z950 Presence of cardiac pacemaker: Secondary | ICD-10-CM | POA: Diagnosis not present

## 2022-05-11 DIAGNOSIS — R278 Other lack of coordination: Secondary | ICD-10-CM | POA: Diagnosis not present

## 2022-05-11 DIAGNOSIS — E871 Hypo-osmolality and hyponatremia: Secondary | ICD-10-CM | POA: Diagnosis not present

## 2022-05-11 DIAGNOSIS — I110 Hypertensive heart disease with heart failure: Secondary | ICD-10-CM | POA: Diagnosis not present

## 2022-05-12 DIAGNOSIS — R278 Other lack of coordination: Secondary | ICD-10-CM | POA: Diagnosis not present

## 2022-05-12 DIAGNOSIS — E871 Hypo-osmolality and hyponatremia: Secondary | ICD-10-CM | POA: Diagnosis not present

## 2022-05-12 DIAGNOSIS — I480 Paroxysmal atrial fibrillation: Secondary | ICD-10-CM | POA: Diagnosis not present

## 2022-05-12 DIAGNOSIS — Z741 Need for assistance with personal care: Secondary | ICD-10-CM | POA: Diagnosis not present

## 2022-05-12 DIAGNOSIS — N1831 Chronic kidney disease, stage 3a: Secondary | ICD-10-CM | POA: Diagnosis not present

## 2022-05-12 DIAGNOSIS — R262 Difficulty in walking, not elsewhere classified: Secondary | ICD-10-CM | POA: Diagnosis not present

## 2022-05-12 DIAGNOSIS — I130 Hypertensive heart and chronic kidney disease with heart failure and stage 1 through stage 4 chronic kidney disease, or unspecified chronic kidney disease: Secondary | ICD-10-CM | POA: Diagnosis not present

## 2022-05-12 DIAGNOSIS — R2681 Unsteadiness on feet: Secondary | ICD-10-CM | POA: Diagnosis not present

## 2022-05-12 DIAGNOSIS — M6281 Muscle weakness (generalized): Secondary | ICD-10-CM | POA: Diagnosis not present

## 2022-05-12 DIAGNOSIS — E039 Hypothyroidism, unspecified: Secondary | ICD-10-CM | POA: Diagnosis not present

## 2022-05-12 DIAGNOSIS — Z950 Presence of cardiac pacemaker: Secondary | ICD-10-CM | POA: Diagnosis not present

## 2022-05-12 DIAGNOSIS — D649 Anemia, unspecified: Secondary | ICD-10-CM | POA: Diagnosis not present

## 2022-05-12 DIAGNOSIS — I110 Hypertensive heart disease with heart failure: Secondary | ICD-10-CM | POA: Diagnosis not present

## 2022-05-12 DIAGNOSIS — F32A Depression, unspecified: Secondary | ICD-10-CM | POA: Diagnosis not present

## 2022-05-12 DIAGNOSIS — I495 Sick sinus syndrome: Secondary | ICD-10-CM | POA: Diagnosis not present

## 2022-05-12 DIAGNOSIS — I5023 Acute on chronic systolic (congestive) heart failure: Secondary | ICD-10-CM | POA: Diagnosis not present

## 2022-05-15 DIAGNOSIS — Z950 Presence of cardiac pacemaker: Secondary | ICD-10-CM | POA: Diagnosis not present

## 2022-05-15 DIAGNOSIS — Z741 Need for assistance with personal care: Secondary | ICD-10-CM | POA: Diagnosis not present

## 2022-05-15 DIAGNOSIS — R2681 Unsteadiness on feet: Secondary | ICD-10-CM | POA: Diagnosis not present

## 2022-05-15 DIAGNOSIS — R262 Difficulty in walking, not elsewhere classified: Secondary | ICD-10-CM | POA: Diagnosis not present

## 2022-05-15 DIAGNOSIS — F32A Depression, unspecified: Secondary | ICD-10-CM | POA: Diagnosis not present

## 2022-05-15 DIAGNOSIS — N1831 Chronic kidney disease, stage 3a: Secondary | ICD-10-CM | POA: Diagnosis not present

## 2022-05-15 DIAGNOSIS — D649 Anemia, unspecified: Secondary | ICD-10-CM | POA: Diagnosis not present

## 2022-05-15 DIAGNOSIS — I5023 Acute on chronic systolic (congestive) heart failure: Secondary | ICD-10-CM | POA: Diagnosis not present

## 2022-05-15 DIAGNOSIS — R278 Other lack of coordination: Secondary | ICD-10-CM | POA: Diagnosis not present

## 2022-05-15 DIAGNOSIS — I110 Hypertensive heart disease with heart failure: Secondary | ICD-10-CM | POA: Diagnosis not present

## 2022-05-15 DIAGNOSIS — I480 Paroxysmal atrial fibrillation: Secondary | ICD-10-CM | POA: Diagnosis not present

## 2022-05-15 DIAGNOSIS — I495 Sick sinus syndrome: Secondary | ICD-10-CM | POA: Diagnosis not present

## 2022-05-15 DIAGNOSIS — E039 Hypothyroidism, unspecified: Secondary | ICD-10-CM | POA: Diagnosis not present

## 2022-05-15 DIAGNOSIS — E871 Hypo-osmolality and hyponatremia: Secondary | ICD-10-CM | POA: Diagnosis not present

## 2022-05-15 DIAGNOSIS — I130 Hypertensive heart and chronic kidney disease with heart failure and stage 1 through stage 4 chronic kidney disease, or unspecified chronic kidney disease: Secondary | ICD-10-CM | POA: Diagnosis not present

## 2022-05-15 DIAGNOSIS — M6281 Muscle weakness (generalized): Secondary | ICD-10-CM | POA: Diagnosis not present

## 2022-05-16 DIAGNOSIS — F32A Depression, unspecified: Secondary | ICD-10-CM | POA: Diagnosis not present

## 2022-05-16 DIAGNOSIS — I495 Sick sinus syndrome: Secondary | ICD-10-CM | POA: Diagnosis not present

## 2022-05-16 DIAGNOSIS — R262 Difficulty in walking, not elsewhere classified: Secondary | ICD-10-CM | POA: Diagnosis not present

## 2022-05-16 DIAGNOSIS — D649 Anemia, unspecified: Secondary | ICD-10-CM | POA: Diagnosis not present

## 2022-05-16 DIAGNOSIS — E039 Hypothyroidism, unspecified: Secondary | ICD-10-CM | POA: Diagnosis not present

## 2022-05-16 DIAGNOSIS — Z950 Presence of cardiac pacemaker: Secondary | ICD-10-CM | POA: Diagnosis not present

## 2022-05-16 DIAGNOSIS — I5023 Acute on chronic systolic (congestive) heart failure: Secondary | ICD-10-CM | POA: Diagnosis not present

## 2022-05-16 DIAGNOSIS — M6281 Muscle weakness (generalized): Secondary | ICD-10-CM | POA: Diagnosis not present

## 2022-05-16 DIAGNOSIS — E871 Hypo-osmolality and hyponatremia: Secondary | ICD-10-CM | POA: Diagnosis not present

## 2022-05-16 DIAGNOSIS — I480 Paroxysmal atrial fibrillation: Secondary | ICD-10-CM | POA: Diagnosis not present

## 2022-05-16 DIAGNOSIS — Z741 Need for assistance with personal care: Secondary | ICD-10-CM | POA: Diagnosis not present

## 2022-05-16 DIAGNOSIS — I130 Hypertensive heart and chronic kidney disease with heart failure and stage 1 through stage 4 chronic kidney disease, or unspecified chronic kidney disease: Secondary | ICD-10-CM | POA: Diagnosis not present

## 2022-05-16 DIAGNOSIS — I110 Hypertensive heart disease with heart failure: Secondary | ICD-10-CM | POA: Diagnosis not present

## 2022-05-16 DIAGNOSIS — R278 Other lack of coordination: Secondary | ICD-10-CM | POA: Diagnosis not present

## 2022-05-16 DIAGNOSIS — R2681 Unsteadiness on feet: Secondary | ICD-10-CM | POA: Diagnosis not present

## 2022-05-16 DIAGNOSIS — N1831 Chronic kidney disease, stage 3a: Secondary | ICD-10-CM | POA: Diagnosis not present

## 2022-05-17 DIAGNOSIS — R2681 Unsteadiness on feet: Secondary | ICD-10-CM | POA: Diagnosis not present

## 2022-05-17 DIAGNOSIS — F32A Depression, unspecified: Secondary | ICD-10-CM | POA: Diagnosis not present

## 2022-05-17 DIAGNOSIS — E039 Hypothyroidism, unspecified: Secondary | ICD-10-CM | POA: Diagnosis not present

## 2022-05-17 DIAGNOSIS — I5023 Acute on chronic systolic (congestive) heart failure: Secondary | ICD-10-CM | POA: Diagnosis not present

## 2022-05-17 DIAGNOSIS — Z950 Presence of cardiac pacemaker: Secondary | ICD-10-CM | POA: Diagnosis not present

## 2022-05-17 DIAGNOSIS — I130 Hypertensive heart and chronic kidney disease with heart failure and stage 1 through stage 4 chronic kidney disease, or unspecified chronic kidney disease: Secondary | ICD-10-CM | POA: Diagnosis not present

## 2022-05-17 DIAGNOSIS — R262 Difficulty in walking, not elsewhere classified: Secondary | ICD-10-CM | POA: Diagnosis not present

## 2022-05-17 DIAGNOSIS — I495 Sick sinus syndrome: Secondary | ICD-10-CM | POA: Diagnosis not present

## 2022-05-17 DIAGNOSIS — I480 Paroxysmal atrial fibrillation: Secondary | ICD-10-CM | POA: Diagnosis not present

## 2022-05-17 DIAGNOSIS — N1831 Chronic kidney disease, stage 3a: Secondary | ICD-10-CM | POA: Diagnosis not present

## 2022-05-17 DIAGNOSIS — I110 Hypertensive heart disease with heart failure: Secondary | ICD-10-CM | POA: Diagnosis not present

## 2022-05-17 DIAGNOSIS — D649 Anemia, unspecified: Secondary | ICD-10-CM | POA: Diagnosis not present

## 2022-05-17 DIAGNOSIS — E871 Hypo-osmolality and hyponatremia: Secondary | ICD-10-CM | POA: Diagnosis not present

## 2022-05-17 DIAGNOSIS — R278 Other lack of coordination: Secondary | ICD-10-CM | POA: Diagnosis not present

## 2022-05-17 DIAGNOSIS — Z741 Need for assistance with personal care: Secondary | ICD-10-CM | POA: Diagnosis not present

## 2022-05-17 DIAGNOSIS — M6281 Muscle weakness (generalized): Secondary | ICD-10-CM | POA: Diagnosis not present

## 2022-05-18 DIAGNOSIS — E039 Hypothyroidism, unspecified: Secondary | ICD-10-CM | POA: Diagnosis not present

## 2022-05-18 DIAGNOSIS — F32A Depression, unspecified: Secondary | ICD-10-CM | POA: Diagnosis not present

## 2022-05-18 DIAGNOSIS — Z741 Need for assistance with personal care: Secondary | ICD-10-CM | POA: Diagnosis not present

## 2022-05-18 DIAGNOSIS — I130 Hypertensive heart and chronic kidney disease with heart failure and stage 1 through stage 4 chronic kidney disease, or unspecified chronic kidney disease: Secondary | ICD-10-CM | POA: Diagnosis not present

## 2022-05-18 DIAGNOSIS — N1831 Chronic kidney disease, stage 3a: Secondary | ICD-10-CM | POA: Diagnosis not present

## 2022-05-18 DIAGNOSIS — M6281 Muscle weakness (generalized): Secondary | ICD-10-CM | POA: Diagnosis not present

## 2022-05-18 DIAGNOSIS — R2681 Unsteadiness on feet: Secondary | ICD-10-CM | POA: Diagnosis not present

## 2022-05-18 DIAGNOSIS — E871 Hypo-osmolality and hyponatremia: Secondary | ICD-10-CM | POA: Diagnosis not present

## 2022-05-18 DIAGNOSIS — I480 Paroxysmal atrial fibrillation: Secondary | ICD-10-CM | POA: Diagnosis not present

## 2022-05-18 DIAGNOSIS — R278 Other lack of coordination: Secondary | ICD-10-CM | POA: Diagnosis not present

## 2022-05-18 DIAGNOSIS — Z950 Presence of cardiac pacemaker: Secondary | ICD-10-CM | POA: Diagnosis not present

## 2022-05-18 DIAGNOSIS — D649 Anemia, unspecified: Secondary | ICD-10-CM | POA: Diagnosis not present

## 2022-05-18 DIAGNOSIS — I110 Hypertensive heart disease with heart failure: Secondary | ICD-10-CM | POA: Diagnosis not present

## 2022-05-18 DIAGNOSIS — I5023 Acute on chronic systolic (congestive) heart failure: Secondary | ICD-10-CM | POA: Diagnosis not present

## 2022-05-18 DIAGNOSIS — I495 Sick sinus syndrome: Secondary | ICD-10-CM | POA: Diagnosis not present

## 2022-05-18 DIAGNOSIS — R262 Difficulty in walking, not elsewhere classified: Secondary | ICD-10-CM | POA: Diagnosis not present

## 2022-05-19 DIAGNOSIS — M6281 Muscle weakness (generalized): Secondary | ICD-10-CM | POA: Diagnosis not present

## 2022-05-19 DIAGNOSIS — Z741 Need for assistance with personal care: Secondary | ICD-10-CM | POA: Diagnosis not present

## 2022-05-19 DIAGNOSIS — I495 Sick sinus syndrome: Secondary | ICD-10-CM | POA: Diagnosis not present

## 2022-05-19 DIAGNOSIS — R2681 Unsteadiness on feet: Secondary | ICD-10-CM | POA: Diagnosis not present

## 2022-05-19 DIAGNOSIS — F32A Depression, unspecified: Secondary | ICD-10-CM | POA: Diagnosis not present

## 2022-05-19 DIAGNOSIS — R262 Difficulty in walking, not elsewhere classified: Secondary | ICD-10-CM | POA: Diagnosis not present

## 2022-05-19 DIAGNOSIS — E039 Hypothyroidism, unspecified: Secondary | ICD-10-CM | POA: Diagnosis not present

## 2022-05-19 DIAGNOSIS — I130 Hypertensive heart and chronic kidney disease with heart failure and stage 1 through stage 4 chronic kidney disease, or unspecified chronic kidney disease: Secondary | ICD-10-CM | POA: Diagnosis not present

## 2022-05-19 DIAGNOSIS — I5023 Acute on chronic systolic (congestive) heart failure: Secondary | ICD-10-CM | POA: Diagnosis not present

## 2022-05-19 DIAGNOSIS — R278 Other lack of coordination: Secondary | ICD-10-CM | POA: Diagnosis not present

## 2022-05-19 DIAGNOSIS — I110 Hypertensive heart disease with heart failure: Secondary | ICD-10-CM | POA: Diagnosis not present

## 2022-05-19 DIAGNOSIS — D649 Anemia, unspecified: Secondary | ICD-10-CM | POA: Diagnosis not present

## 2022-05-19 DIAGNOSIS — Z950 Presence of cardiac pacemaker: Secondary | ICD-10-CM | POA: Diagnosis not present

## 2022-05-19 DIAGNOSIS — N1831 Chronic kidney disease, stage 3a: Secondary | ICD-10-CM | POA: Diagnosis not present

## 2022-05-19 DIAGNOSIS — I480 Paroxysmal atrial fibrillation: Secondary | ICD-10-CM | POA: Diagnosis not present

## 2022-05-19 DIAGNOSIS — E871 Hypo-osmolality and hyponatremia: Secondary | ICD-10-CM | POA: Diagnosis not present

## 2022-05-22 ENCOUNTER — Encounter: Payer: Self-pay | Admitting: Nurse Practitioner

## 2022-05-22 ENCOUNTER — Non-Acute Institutional Stay (SKILLED_NURSING_FACILITY): Payer: Medicare Other | Admitting: Nurse Practitioner

## 2022-05-22 DIAGNOSIS — I5023 Acute on chronic systolic (congestive) heart failure: Secondary | ICD-10-CM | POA: Diagnosis not present

## 2022-05-22 DIAGNOSIS — I495 Sick sinus syndrome: Secondary | ICD-10-CM | POA: Diagnosis not present

## 2022-05-22 DIAGNOSIS — E039 Hypothyroidism, unspecified: Secondary | ICD-10-CM | POA: Diagnosis not present

## 2022-05-22 DIAGNOSIS — R296 Repeated falls: Secondary | ICD-10-CM | POA: Diagnosis not present

## 2022-05-22 DIAGNOSIS — I110 Hypertensive heart disease with heart failure: Secondary | ICD-10-CM | POA: Diagnosis not present

## 2022-05-22 DIAGNOSIS — D649 Anemia, unspecified: Secondary | ICD-10-CM | POA: Diagnosis not present

## 2022-05-22 DIAGNOSIS — I10 Essential (primary) hypertension: Secondary | ICD-10-CM

## 2022-05-22 DIAGNOSIS — Z741 Need for assistance with personal care: Secondary | ICD-10-CM | POA: Diagnosis not present

## 2022-05-22 DIAGNOSIS — I509 Heart failure, unspecified: Secondary | ICD-10-CM | POA: Diagnosis not present

## 2022-05-22 DIAGNOSIS — R051 Acute cough: Secondary | ICD-10-CM | POA: Diagnosis not present

## 2022-05-22 DIAGNOSIS — I951 Orthostatic hypotension: Secondary | ICD-10-CM

## 2022-05-22 DIAGNOSIS — E871 Hypo-osmolality and hyponatremia: Secondary | ICD-10-CM | POA: Diagnosis not present

## 2022-05-22 DIAGNOSIS — R278 Other lack of coordination: Secondary | ICD-10-CM | POA: Diagnosis not present

## 2022-05-22 DIAGNOSIS — F331 Major depressive disorder, recurrent, moderate: Secondary | ICD-10-CM

## 2022-05-22 DIAGNOSIS — R2681 Unsteadiness on feet: Secondary | ICD-10-CM | POA: Diagnosis not present

## 2022-05-22 DIAGNOSIS — M6281 Muscle weakness (generalized): Secondary | ICD-10-CM | POA: Diagnosis not present

## 2022-05-22 DIAGNOSIS — R262 Difficulty in walking, not elsewhere classified: Secondary | ICD-10-CM | POA: Diagnosis not present

## 2022-05-22 DIAGNOSIS — I480 Paroxysmal atrial fibrillation: Secondary | ICD-10-CM | POA: Diagnosis not present

## 2022-05-22 DIAGNOSIS — N1832 Chronic kidney disease, stage 3b: Secondary | ICD-10-CM | POA: Diagnosis not present

## 2022-05-22 DIAGNOSIS — I130 Hypertensive heart and chronic kidney disease with heart failure and stage 1 through stage 4 chronic kidney disease, or unspecified chronic kidney disease: Secondary | ICD-10-CM | POA: Diagnosis not present

## 2022-05-22 DIAGNOSIS — Z950 Presence of cardiac pacemaker: Secondary | ICD-10-CM | POA: Diagnosis not present

## 2022-05-22 DIAGNOSIS — N1831 Chronic kidney disease, stage 3a: Secondary | ICD-10-CM | POA: Diagnosis not present

## 2022-05-22 DIAGNOSIS — F32A Depression, unspecified: Secondary | ICD-10-CM | POA: Diagnosis not present

## 2022-05-22 NOTE — Progress Notes (Signed)
Location:  Other Nursing Home Room Number: 208A Place of Service:  SNF (31)  Dewayne Shorter, MD  Patient Care Team: Dewayne Shorter, MD as PCP - General (Family Medicine) Lauree Chandler, NP as Nurse Practitioner (Geriatric Medicine)  Extended Emergency Contact Information Primary Emergency Contact: [facility] Address: [place] [address] [name] of [place] Phone: [phone] Mobile Phone: [phone] Relation: Son Secondary Emergency Contact: Leanora Ivanoff States of [place] Phone: [phone] Mobile Phone: [phone] Relation: Son  Goals of care: Advanced Directive information    05/22/2022    3:29 PM  Advanced Directives  Does Patient Have a Medical Advance Directive? Yes  Type of Paramedic of [place];Living will;Out of facility DNR (pink MOST or yellow form)  Does patient want to make changes to medical advance directive? No - Patient declined  Copy of [place] in Chart? Yes - validated most recent copy scanned in chart (See row information)  Pre-existing out of facility DNR order (yellow form or pink MOST form) Pink MOST form placed in chart (order not valid for inpatient use);Yellow form placed in chart (order not valid for inpatient use)     Chief Complaint  Patient presents with   Medical Management of Chronic Issues    Routine follow up   Immunizations    Shingrix vaccine and COVID vaccine due   Quality Metric Gaps    Medicare annual wellness due    HPI:  Pt is a 89 y.o. male seen today for medical management of chronic disease.  Staff reported this morning he had an episode of coughing and shortness of breath this morning.  Reports this improving but then this afternoon he was working on getting files organized and had another episode.  He continues to do therapy and normally does well with this without significant shortness of breath.  He did  not do therapy today. No shortness of breath currently.  He has some chronic LE edema which has been well controlled.   Continues to get treatment on his heels for deep tissue injury- continues with pressure reduction.   Has incontinence of bowels due to lack of control of sphincter  TSH elevated on last lab     OA_ got a supportive sleeve for his knee.     Past Medical History:  Diagnosis Date   AAA (abdominal aortic aneurysm) ([place])    Actinic keratosis 07/22/2017   left lateral crown, midline crown, right of midline crown   Anemia    Aortic atherosclerosis (HCC)    Atrial fibrillation ([place]) 03/2016   brief spell   B12 deficiency    Basal cell carcinoma 10/30/2016   L lat crown   Bradycardia    Bradycardia 02/2017   Pacer placed   CHF (congestive heart failure) (HCC)    Chronic kidney disease (CKD), stage III (moderate) (HCC)    Depression    Hyperlipidemia    Hypertension    Hyponatremia    Hypothyroidism    Osteoarthritis    Prostate cancer ([place]) 2001   Rad tx's + seed implants   Renal cancer, left ([place]) 03/2016   Left Renal Nephrectomy   Sick sinus syndrome ([place])    Pacemaker   Past Surgical History:  Procedure Laterality Date   CATARACT EXTRACTION, BILATERAL     COLONOSCOPY     EXCISIONAL HEMORRHOIDECTOMY     INSERTION PROSTATE RADIATION SEED  LAPAROSCOPIC NEPHRECTOMY, HAND ASSISTED Left 03/08/2016   Procedure: HAND ASSISTED LAPAROSCOPIC NEPHRECTOMY;  Surgeon: Nickie Retort, MD;  Location: [facility] ORS;  Service: Urology;  Laterality: Left;   LAPAROTOMY N/A 03/13/2016   Procedure: EXPLORATORY LAPAROTOMY;  Surgeon: Festus Aloe, MD;  Location: [facility] ORS;  Service: Urology;  Laterality: N/A;   PACEMAKER INSERTION N/A 02/20/2017   Procedure: INSERTION PACEMAKER;  Surgeon: Isaias Cowman, MD;  Location: [facility] ORS;  Service: Cardiovascular;  Laterality: N/A;    No Known Allergies  Outpatient Encounter Medications as of 05/22/2022  Medication Sig    acetaminophen (TYLENOL) 500 MG tablet Take 1,000 mg by mouth at bedtime.   acetaminophen (TYLENOL) 650 MG CR tablet Take 650 mg by mouth every 4 (four) hours as needed for pain.   Bismuth Subsalicylate (KAOPECTATE PO) Take 10 mLs by mouth daily as needed. For diarrhea   carbamide peroxide (DEBROX) 6.5 % OTIC solution Place 5 drops into both ears as needed.   collagenase (SANTYL) 250 UNIT/GM ointment Apply to Bilateral hells topically every day shift for wound healing. Apply to bilateral heels 63m thickness then apply calcium alginate and cover with foam dressing.   Cyanocobalamin 1000 MCG TBCR Take 1,000 mcg by mouth daily.    dextromethorphan-guaiFENesin (ROBITUSSIN-DM) 10-100 MG/5ML liquid Take 10 mLs by mouth every 4 (four) hours as needed for cough.   ELIQUIS 2.5 MG TABS tablet Take 2.5 mg by mouth 2 (two) times daily.   Ensure (ENSURE) Take 237 mLs by mouth 3 (three) times daily between meals.   ferrous sulfate 325 (65 FE) MG tablet Take 1 tablet (325 mg total) by mouth daily with breakfast.   levothyroxine (SYNTHROID, LEVOTHROID) 112 MCG tablet Take 112 mcg by mouth daily before breakfast.   magnesium hydroxide (MILK OF MAGNESIA) 400 MG/5ML suspension Take 30 mLs by mouth daily as needed for mild constipation.   midodrine (PROAMATINE) 2.5 MG tablet Take 2.5 mg by mouth in the morning and at bedtime.   Multiple Vitamin (MULTI-VITAMINS) TABS Take 1 tablet by mouth daily.   mupirocin ointment (BACTROBAN) 2 % 1 Application daily. Apply to scalp daily   nystatin (MYCOSTATIN/NYSTOP) powder Apply 1 Application topically as needed.   ondansetron (ZOFRAN) 4 MG tablet Take 4 mg by mouth as needed for nausea or vomiting. Up to three times a day   sertraline (ZOLOFT) 100 MG tablet Take 100 mg by mouth daily.   torsemide (DEMADEX) 20 MG tablet Take 20 mg by mouth daily.   witch hazel-glycerin (TUCKS) pad Apply topically as needed for itching.   Zinc Oxide (DESITIN) 13 % CREA Apply topically. Apply to  rectum topically ever 2 hours  needed   No facility-administered encounter medications on file as of 05/22/2022.    Review of Systems  Constitutional:  Negative for activity change, appetite change, fatigue and unexpected weight change.  HENT:  Negative for congestion and hearing loss.   Eyes: Negative.   Respiratory:  Positive for cough (dry cough) and shortness of breath (with activity).   Cardiovascular:  Negative for chest pain, palpitations and leg swelling.  Gastrointestinal:  Negative for abdominal pain, constipation and diarrhea.  Genitourinary:  Negative for difficulty urinating and dysuria.  Musculoskeletal:  Negative for arthralgias and myalgias.  Skin:  Negative for color change and wound.  Neurological:  Negative for dizziness and weakness.  Psychiatric/Behavioral:  Positive for dysphoric mood. Negative for agitation, behavioral problems and confusion.      Immunization History  Administered Date(s) Administered   Influenza, High  Dose Seasonal PF 01/25/2017, 03/04/2018, 02/21/2022   Influenza-Unspecified 03/14/2015, 02/21/2021   Moderna Sars-Covid-2 Vaccination 05/18/2019, 06/15/2019, 03/16/2022   Pneumococcal Conjugate-13 07/09/2014, 12/05/2015   Pneumococcal Polysaccharide-23 12/04/2016   Tdap 04/12/2018   Unspecified SARS-COV-2 Vaccination 10/03/2021   Pertinent  Health Maintenance Due  Topic Date Due   INFLUENZA VACCINE  Completed      02/26/2022    9:00 PM 02/27/2022    7:46 PM 02/28/2022    2:00 PM 02/28/2022    7:53 PM 03/01/2022    8:30 AM  Fall Risk  (RETIRED) Patient Fall Risk Level High fall risk High fall risk High fall risk High fall risk High fall risk   Functional Status Survey:    Vitals:   05/22/22 1527  BP: (!) 133/95  Pulse: 68  Resp: 18  Temp: (!) 97.1 F (36.2 C)  SpO2: 94%  Weight: 159 lb (72.1 kg)  Height: '5\' 9"'$  (1.753 m)   Body mass index is 23.48 kg/m. Physical Exam Constitutional:      General: He is not in acute  distress.    Appearance: He is well-developed. He is not diaphoretic.  HENT:     Head: Normocephalic and atraumatic.     Right Ear: External ear normal.     Left Ear: External ear normal.     Mouth/Throat:     Pharynx: No oropharyngeal exudate.  Eyes:     Conjunctiva/sclera: Conjunctivae normal.     Pupils: Pupils are equal, round, and reactive to light.  Cardiovascular:     Rate and Rhythm: Normal rate and regular rhythm.     Heart sounds: Normal heart sounds.  Pulmonary:     Effort: Pulmonary effort is normal.     Breath sounds: Normal breath sounds.  Abdominal:     General: Bowel sounds are normal.     Palpations: Abdomen is soft.  Musculoskeletal:        General: No tenderness.     Cervical back: Normal range of motion and neck supple.     Right lower leg: No edema.     Left lower leg: No edema.  Skin:    General: Skin is warm and dry.     Comments: Dressing to bilateral heels  Neurological:     Mental Status: He is alert and oriented to person, place, and time.     Labs reviewed: Recent Labs    02/23/22 0715 02/23/22 1359 02/25/22 0542 02/26/22 0653 02/28/22 0433 03/01/22 0625 03/26/22 0000 04/26/22 0000  NA 126*   < > 129* 128* 127* 129* 140 136*  K 4.1   < > 3.5 3.3* 3.7 3.3* 4.5 5.0  CL 96*   < > 99 94* 91* 90* 108 102  CO2 21*   < > '23 24 26 26 '$ 23* 26*  GLUCOSE 101*   < > 109* 102* 100* 99  --   --   BUN 22   < > 20 18 28* 35* 39* 43*  CREATININE 1.15   < > 1.20 1.30* 1.37* 1.49* 1.4* 1.8*  CALCIUM 8.9   < > 8.2* 8.3* 8.0* 8.3* 8.7 8.6*  MG 2.0   < > 1.9 1.8 1.8  --   --   --   PHOS 3.4  --   --   --   --   --   --   --    < > = values in this interval not displayed.   Recent Labs    02/23/22 0715  03/26/22 0000  AST 31 12*  ALT 29 12  ALKPHOS 90 74  BILITOT 0.9  --   PROT 7.2  --   ALBUMIN 3.7 6.0*   Recent Labs    02/24/22 0829 02/25/22 0542 02/26/22 0653 02/28/22 0433 03/26/22 0000 04/26/22 0000  WBC 6.3 4.8 4.9 5.8 4.9 5.0   NEUTROABS 5.0  --   --   --  3,023.00 3,240.00  HGB 10.3* 9.4* 10.0* 9.7* 12.5* 12.6*  HCT 31.4* 27.7* 29.3* 28.9* 37* 38*  MCV 86.5 84.2 85.2 85.5  --   --   PLT 250 237 234 226 203 262   Lab Results  Component Value Date   TSH 8.962 (H) 02/23/2022   No results found for: "HGBA1C" No results found for: "CHOL", "HDL", "LDLCALC", "LDLDIRECT", "TRIG", "CHOLHDL"  Significant Diagnostic Results in last 30 days:  No results found.  Assessment/Plan 1. Hypothyroidism, unspecified type -mildly elevated on last lab- will follow up TSH  2. Paroxysmal atrial fibrillation (HCC) Rate controlled,  continues on eliquis for anticoagulation.   3. Chronic congestive heart failure, unspecified heart failure type ([place]) -stable continues on demadex, without worsening LE edema, weight gain, shortness of breath.   4. Orthostatic hypotension -controlled on midodrine   5. Stage 3b chronic kidney disease (HCC) -Chronic and stable Encourage proper hydration Follow metabolic panel Avoid nephrotoxic meds (NSAIDS)  6. Moderate episode of recurrent major depressive disorder ([place]) -ongoing, reports he has good and bad days. Still wants to maintain independence.   7. Acute cough Nonproductive at this time, reports cough baseline for him. He is without shortness of breath, congestion, fever or chills or myalgias. Will have staff monitor and notify as needed.   8. Frequent falls Discussed fall precautions, he still is trying to maintain his independence and does not call for help when he should. Reinforced fall prevention.   Carlos American. [place], [facility] [phone]

## 2022-05-23 DIAGNOSIS — E039 Hypothyroidism, unspecified: Secondary | ICD-10-CM | POA: Diagnosis not present

## 2022-05-23 DIAGNOSIS — M6281 Muscle weakness (generalized): Secondary | ICD-10-CM | POA: Diagnosis not present

## 2022-05-23 DIAGNOSIS — R2681 Unsteadiness on feet: Secondary | ICD-10-CM | POA: Diagnosis not present

## 2022-05-23 DIAGNOSIS — N1831 Chronic kidney disease, stage 3a: Secondary | ICD-10-CM | POA: Diagnosis not present

## 2022-05-23 DIAGNOSIS — I480 Paroxysmal atrial fibrillation: Secondary | ICD-10-CM | POA: Diagnosis not present

## 2022-05-23 DIAGNOSIS — I130 Hypertensive heart and chronic kidney disease with heart failure and stage 1 through stage 4 chronic kidney disease, or unspecified chronic kidney disease: Secondary | ICD-10-CM | POA: Diagnosis not present

## 2022-05-23 DIAGNOSIS — F32A Depression, unspecified: Secondary | ICD-10-CM | POA: Diagnosis not present

## 2022-05-23 DIAGNOSIS — D649 Anemia, unspecified: Secondary | ICD-10-CM | POA: Diagnosis not present

## 2022-05-23 DIAGNOSIS — Z741 Need for assistance with personal care: Secondary | ICD-10-CM | POA: Diagnosis not present

## 2022-05-23 DIAGNOSIS — I110 Hypertensive heart disease with heart failure: Secondary | ICD-10-CM | POA: Diagnosis not present

## 2022-05-23 DIAGNOSIS — R278 Other lack of coordination: Secondary | ICD-10-CM | POA: Diagnosis not present

## 2022-05-23 DIAGNOSIS — I495 Sick sinus syndrome: Secondary | ICD-10-CM | POA: Diagnosis not present

## 2022-05-23 DIAGNOSIS — Z950 Presence of cardiac pacemaker: Secondary | ICD-10-CM | POA: Diagnosis not present

## 2022-05-23 DIAGNOSIS — R262 Difficulty in walking, not elsewhere classified: Secondary | ICD-10-CM | POA: Diagnosis not present

## 2022-05-23 DIAGNOSIS — I5023 Acute on chronic systolic (congestive) heart failure: Secondary | ICD-10-CM | POA: Diagnosis not present

## 2022-05-23 DIAGNOSIS — E871 Hypo-osmolality and hyponatremia: Secondary | ICD-10-CM | POA: Diagnosis not present

## 2022-05-24 DIAGNOSIS — I5023 Acute on chronic systolic (congestive) heart failure: Secondary | ICD-10-CM | POA: Diagnosis not present

## 2022-05-24 DIAGNOSIS — I110 Hypertensive heart disease with heart failure: Secondary | ICD-10-CM | POA: Diagnosis not present

## 2022-05-24 DIAGNOSIS — R278 Other lack of coordination: Secondary | ICD-10-CM | POA: Diagnosis not present

## 2022-05-24 DIAGNOSIS — I495 Sick sinus syndrome: Secondary | ICD-10-CM | POA: Diagnosis not present

## 2022-05-24 DIAGNOSIS — R262 Difficulty in walking, not elsewhere classified: Secondary | ICD-10-CM | POA: Diagnosis not present

## 2022-05-24 DIAGNOSIS — E039 Hypothyroidism, unspecified: Secondary | ICD-10-CM | POA: Diagnosis not present

## 2022-05-24 DIAGNOSIS — N1831 Chronic kidney disease, stage 3a: Secondary | ICD-10-CM | POA: Diagnosis not present

## 2022-05-24 DIAGNOSIS — Z741 Need for assistance with personal care: Secondary | ICD-10-CM | POA: Diagnosis not present

## 2022-05-24 DIAGNOSIS — F32A Depression, unspecified: Secondary | ICD-10-CM | POA: Diagnosis not present

## 2022-05-24 DIAGNOSIS — I480 Paroxysmal atrial fibrillation: Secondary | ICD-10-CM | POA: Diagnosis not present

## 2022-05-24 DIAGNOSIS — D649 Anemia, unspecified: Secondary | ICD-10-CM | POA: Diagnosis not present

## 2022-05-24 DIAGNOSIS — I130 Hypertensive heart and chronic kidney disease with heart failure and stage 1 through stage 4 chronic kidney disease, or unspecified chronic kidney disease: Secondary | ICD-10-CM | POA: Diagnosis not present

## 2022-05-24 DIAGNOSIS — Z950 Presence of cardiac pacemaker: Secondary | ICD-10-CM | POA: Diagnosis not present

## 2022-05-24 DIAGNOSIS — M6281 Muscle weakness (generalized): Secondary | ICD-10-CM | POA: Diagnosis not present

## 2022-05-24 DIAGNOSIS — R2681 Unsteadiness on feet: Secondary | ICD-10-CM | POA: Diagnosis not present

## 2022-05-24 DIAGNOSIS — E871 Hypo-osmolality and hyponatremia: Secondary | ICD-10-CM | POA: Diagnosis not present

## 2022-05-25 DIAGNOSIS — E039 Hypothyroidism, unspecified: Secondary | ICD-10-CM | POA: Diagnosis not present

## 2022-05-25 DIAGNOSIS — R262 Difficulty in walking, not elsewhere classified: Secondary | ICD-10-CM | POA: Diagnosis not present

## 2022-05-25 DIAGNOSIS — I5023 Acute on chronic systolic (congestive) heart failure: Secondary | ICD-10-CM | POA: Diagnosis not present

## 2022-05-25 DIAGNOSIS — I110 Hypertensive heart disease with heart failure: Secondary | ICD-10-CM | POA: Diagnosis not present

## 2022-05-25 DIAGNOSIS — M6281 Muscle weakness (generalized): Secondary | ICD-10-CM | POA: Diagnosis not present

## 2022-05-25 DIAGNOSIS — R2681 Unsteadiness on feet: Secondary | ICD-10-CM | POA: Diagnosis not present

## 2022-05-25 DIAGNOSIS — Z950 Presence of cardiac pacemaker: Secondary | ICD-10-CM | POA: Diagnosis not present

## 2022-05-25 DIAGNOSIS — I130 Hypertensive heart and chronic kidney disease with heart failure and stage 1 through stage 4 chronic kidney disease, or unspecified chronic kidney disease: Secondary | ICD-10-CM | POA: Diagnosis not present

## 2022-05-25 DIAGNOSIS — F32A Depression, unspecified: Secondary | ICD-10-CM | POA: Diagnosis not present

## 2022-05-25 DIAGNOSIS — D649 Anemia, unspecified: Secondary | ICD-10-CM | POA: Diagnosis not present

## 2022-05-25 DIAGNOSIS — Z741 Need for assistance with personal care: Secondary | ICD-10-CM | POA: Diagnosis not present

## 2022-05-25 DIAGNOSIS — R278 Other lack of coordination: Secondary | ICD-10-CM | POA: Diagnosis not present

## 2022-05-25 DIAGNOSIS — I480 Paroxysmal atrial fibrillation: Secondary | ICD-10-CM | POA: Diagnosis not present

## 2022-05-25 DIAGNOSIS — I495 Sick sinus syndrome: Secondary | ICD-10-CM | POA: Diagnosis not present

## 2022-05-25 DIAGNOSIS — N1831 Chronic kidney disease, stage 3a: Secondary | ICD-10-CM | POA: Diagnosis not present

## 2022-05-25 DIAGNOSIS — E871 Hypo-osmolality and hyponatremia: Secondary | ICD-10-CM | POA: Diagnosis not present

## 2022-05-28 ENCOUNTER — Encounter: Payer: Medicare Other | Admitting: Family

## 2022-05-29 ENCOUNTER — Encounter: Payer: Self-pay | Admitting: Family

## 2022-05-29 ENCOUNTER — Ambulatory Visit: Payer: Medicare Other | Attending: Family | Admitting: Family

## 2022-05-29 VITALS — BP 130/78 | HR 81 | Resp 18 | Wt 162.5 lb

## 2022-05-29 DIAGNOSIS — E785 Hyperlipidemia, unspecified: Secondary | ICD-10-CM | POA: Insufficient documentation

## 2022-05-29 DIAGNOSIS — Z7901 Long term (current) use of anticoagulants: Secondary | ICD-10-CM | POA: Diagnosis not present

## 2022-05-29 DIAGNOSIS — Z85528 Personal history of other malignant neoplasm of kidney: Secondary | ICD-10-CM | POA: Diagnosis not present

## 2022-05-29 DIAGNOSIS — R0602 Shortness of breath: Secondary | ICD-10-CM | POA: Insufficient documentation

## 2022-05-29 DIAGNOSIS — M1990 Unspecified osteoarthritis, unspecified site: Secondary | ICD-10-CM | POA: Insufficient documentation

## 2022-05-29 DIAGNOSIS — I5022 Chronic systolic (congestive) heart failure: Secondary | ICD-10-CM

## 2022-05-29 DIAGNOSIS — M6281 Muscle weakness (generalized): Secondary | ICD-10-CM | POA: Diagnosis not present

## 2022-05-29 DIAGNOSIS — I110 Hypertensive heart disease with heart failure: Secondary | ICD-10-CM | POA: Diagnosis not present

## 2022-05-29 DIAGNOSIS — R2681 Unsteadiness on feet: Secondary | ICD-10-CM | POA: Diagnosis not present

## 2022-05-29 DIAGNOSIS — I495 Sick sinus syndrome: Secondary | ICD-10-CM | POA: Diagnosis not present

## 2022-05-29 DIAGNOSIS — I130 Hypertensive heart and chronic kidney disease with heart failure and stage 1 through stage 4 chronic kidney disease, or unspecified chronic kidney disease: Secondary | ICD-10-CM | POA: Diagnosis not present

## 2022-05-29 DIAGNOSIS — I5023 Acute on chronic systolic (congestive) heart failure: Secondary | ICD-10-CM | POA: Diagnosis not present

## 2022-05-29 DIAGNOSIS — R278 Other lack of coordination: Secondary | ICD-10-CM | POA: Diagnosis not present

## 2022-05-29 DIAGNOSIS — D631 Anemia in chronic kidney disease: Secondary | ICD-10-CM | POA: Insufficient documentation

## 2022-05-29 DIAGNOSIS — R262 Difficulty in walking, not elsewhere classified: Secondary | ICD-10-CM | POA: Diagnosis not present

## 2022-05-29 DIAGNOSIS — Z85828 Personal history of other malignant neoplasm of skin: Secondary | ICD-10-CM | POA: Insufficient documentation

## 2022-05-29 DIAGNOSIS — E871 Hypo-osmolality and hyponatremia: Secondary | ICD-10-CM | POA: Insufficient documentation

## 2022-05-29 DIAGNOSIS — Z950 Presence of cardiac pacemaker: Secondary | ICD-10-CM | POA: Diagnosis not present

## 2022-05-29 DIAGNOSIS — I959 Hypotension, unspecified: Secondary | ICD-10-CM | POA: Insufficient documentation

## 2022-05-29 DIAGNOSIS — Z8679 Personal history of other diseases of the circulatory system: Secondary | ICD-10-CM | POA: Insufficient documentation

## 2022-05-29 DIAGNOSIS — E039 Hypothyroidism, unspecified: Secondary | ICD-10-CM | POA: Insufficient documentation

## 2022-05-29 DIAGNOSIS — I10 Essential (primary) hypertension: Secondary | ICD-10-CM | POA: Diagnosis not present

## 2022-05-29 DIAGNOSIS — Z8546 Personal history of malignant neoplasm of prostate: Secondary | ICD-10-CM | POA: Insufficient documentation

## 2022-05-29 DIAGNOSIS — N1831 Chronic kidney disease, stage 3a: Secondary | ICD-10-CM | POA: Diagnosis not present

## 2022-05-29 DIAGNOSIS — I480 Paroxysmal atrial fibrillation: Secondary | ICD-10-CM | POA: Diagnosis not present

## 2022-05-29 DIAGNOSIS — N183 Chronic kidney disease, stage 3 unspecified: Secondary | ICD-10-CM | POA: Diagnosis not present

## 2022-05-29 DIAGNOSIS — Z741 Need for assistance with personal care: Secondary | ICD-10-CM | POA: Diagnosis not present

## 2022-05-29 DIAGNOSIS — R42 Dizziness and giddiness: Secondary | ICD-10-CM | POA: Diagnosis not present

## 2022-05-29 DIAGNOSIS — F32A Depression, unspecified: Secondary | ICD-10-CM | POA: Diagnosis not present

## 2022-05-29 DIAGNOSIS — D649 Anemia, unspecified: Secondary | ICD-10-CM | POA: Diagnosis not present

## 2022-05-29 NOTE — Progress Notes (Signed)
Patient ID: Frank Bartlett, male    DOB: 29-Feb-1926, 89 y.o.   MRN: [id number]  HPI  Mr Colclough is a 89 y/o male with a history of AAA, prostate/ renal cancer, hyperlipidemia, HTN, CKD, thyroid disease, anemia, PAF, depression, hyponatremia, SSS with pacemaker and chronic heart failure.   Echo report from 02/24/22 reviewed and showed an EF of 30-35% along with mild LVH, severe LAE/RAE, mild AR and moderate MR/TR.   Admitted 02/23/22 due to leg edema and abnormal labs. CXR showed pulmonary edemal and BNP elevated. Initially given IV lasix. Patient refused meds and wanted comfort care. Then started feeling better and wanted treatment. Palliative care consult obtained. Restarted diuretics. Hyponatremia likely due to SIADH. OT/ speech/ PT evaluations done. Discharged after 6 days.   He presents today for a follow-up visit with a chief complaint of minimal fatigue upon moderate exertion. Describes this as chronic in nature. Has associated shortness of breath, intermittent dizziness and gradual weight gain along with this. Denies any difficulty sleeping, abdominal distention, palpitations, pedal edema, chest pain or cough.   Was unable to tolerate farxiga due to hypotension. Also now taking torsemide PRN.   Past Medical History:  Diagnosis Date   AAA (abdominal aortic aneurysm) ([place])    Actinic keratosis 07/22/2017   left lateral crown, midline crown, right of midline crown   Anemia    Aortic atherosclerosis (HCC)    Atrial fibrillation ([place]) 03/2016   brief spell   B12 deficiency    Basal cell carcinoma 10/30/2016   L lat crown   Bradycardia    Bradycardia 02/2017   Pacer placed   CHF (congestive heart failure) (HCC)    Chronic kidney disease (CKD), stage III (moderate) (HCC)    Depression    Hyperlipidemia    Hypertension    Hyponatremia    Hypothyroidism    Osteoarthritis    Prostate cancer ([place]) 2001   Rad tx's + seed implants   Renal cancer, left ([place]) 03/2016   Left Renal  Nephrectomy   Sick sinus syndrome ([place])    Pacemaker   Past Surgical History:  Procedure Laterality Date   CATARACT EXTRACTION, BILATERAL     COLONOSCOPY     EXCISIONAL HEMORRHOIDECTOMY     INSERTION PROSTATE RADIATION SEED     LAPAROSCOPIC NEPHRECTOMY, HAND ASSISTED Left 03/08/2016   Procedure: HAND ASSISTED LAPAROSCOPIC NEPHRECTOMY;  Surgeon: Nickie Retort, MD;  Location: [facility];  Service: Urology;  Laterality: Left;   LAPAROTOMY N/A 03/13/2016   Procedure: EXPLORATORY LAPAROTOMY;  Surgeon: Festus Aloe, MD;  Location: [facility];  Service: Urology;  Laterality: N/A;   PACEMAKER INSERTION N/A 02/20/2017   Procedure: INSERTION PACEMAKER;  Surgeon: Isaias Cowman, MD;  Location: [facility];  Service: Cardiovascular;  Laterality: N/A;   Family History  Problem Relation Age of Onset   Hypertension Mother    Breast cancer Mother    Colon cancer Father    Prostate cancer Neg Hx    Bladder Cancer Neg Hx    Kidney cancer Neg Hx    Social History   Tobacco Use   Smoking status: Never   Smokeless tobacco: Never  Substance Use Topics   Alcohol use: No    Alcohol/week: 0.0 standard drinks of alcohol    Comment: rare wine   No Known Allergies  Prior to Admission medications   Medication Sig Start Date End Date Taking? Authorizing Provider  acetaminophen (TYLENOL) 650 MG CR tablet Take 650 mg by mouth every 4 (  four) hours as needed for pain.   Yes [provider]  Bismuth Subsalicylate (KAOPECTATE PO) Take 10 mLs by mouth daily as needed. For diarrhea   Yes [provider]  carbamide peroxide (DEBROX) 6.5 % OTIC solution Place 5 drops into both ears as needed.   Yes [provider]  collagenase (SANTYL) 250 UNIT/GM ointment Apply to Bilateral hells topically every day shift for wound healing. Apply to bilateral heels 5m thickness then apply calcium alginate and cover with foam dressing.   Yes [provider]  Cyanocobalamin 1000 MCG TBCR  Take 1,000 mcg by mouth daily.    Yes [provider]  dextromethorphan-guaiFENesin (ROBITUSSIN-DM) 10-100 MG/5ML liquid Take 10 mLs by mouth every 4 (four) hours as needed for cough.   Yes [provider]  ELIQUIS 2.5 MG TABS tablet Take 2.5 mg by mouth 2 (two) times daily. 11/04/20  Yes [provider]  ferrous sulfate 325 (65 FE) MG tablet Take 1 tablet (325 mg total) by mouth daily with breakfast. 03/02/22  Yes Wieting, Richard, MD  levothyroxine (SYNTHROID, LEVOTHROID) 112 MCG tablet Take 112 mcg by mouth daily before breakfast.   Yes [provider]  magnesium hydroxide (MILK OF MAGNESIA) 400 MG/5ML suspension Take 30 mLs by mouth daily as needed for mild constipation.   Yes [provider]  midodrine (PROAMATINE) 2.5 MG tablet Take 2.5 mg by mouth daily.   Yes [provider]  Multiple Vitamin (MULTI-VITAMINS) TABS Take 1 tablet by mouth daily.   Yes [provider]  mupirocin ointment (BACTROBAN) 2 % 1 Application daily. Apply to scalp daily   Yes [provider]  nystatin (MYCOSTATIN/NYSTOP) powder Apply 1 Application topically as needed.   Yes [provider]  ondansetron (ZOFRAN) 4 MG tablet Take 4 mg by mouth as needed for nausea or vomiting. Up to three times a day   Yes [provider]  sertraline (ZOLOFT) 100 MG tablet Take 100 mg by mouth daily.   Yes [provider]  torsemide (DEMADEX) 20 MG tablet Take 20 mg by mouth as needed (wt gain 3 lbs overnight or 5 lbs in seven days).   Yes [provider]  witch hazel-glycerin (TUCKS) pad Apply topically as needed for itching. 03/01/22  Yes Wieting, Richard, MD  Zinc Oxide (DESITIN) 13 % CREA Apply topically. Apply to rectum topically ever 2 hours  needed   Yes [provider]  Ensure (ENSURE) Take 237 mLs by mouth 3 (three) times daily between meals.    [provider]    Review of Systems  Constitutional:   Positive for appetite change (decreased) and fatigue.  HENT:  Negative for congestion, postnasal drip and sore throat.   Eyes: Negative.   Respiratory:  Positive for shortness of breath. Negative for cough and chest tightness.   Cardiovascular:  Negative for chest pain, palpitations and leg swelling.  Gastrointestinal:  Negative for abdominal distention and abdominal pain.  Endocrine: Negative.   Genitourinary: Negative.   Musculoskeletal:  Positive for arthralgias (feet pain). Negative for back pain.  Skin:  Positive for wound (bottom of both heels).  Allergic/Immunologic: Negative.   Neurological:  Positive for dizziness (at times). Negative for light-headedness.  Hematological:  Negative for adenopathy. Does not bruise/bleed easily.  Psychiatric/Behavioral:  Negative for dysphoric mood and sleep disturbance (sleeping on 2 pillows). The patient is not nervous/anxious.    Vitals:   05/29/22 1312  BP: 130/78  Pulse: 81  Resp: 18  SpO2:  96%  Weight: 162 lb 8 oz (73.7 kg)   Wt Readings from Last 3 Encounters:  05/29/22 162 lb 8 oz (73.7 kg)  05/22/22 159 lb (72.1 kg)  05/04/22 152 lb 6.4 oz (69.1 kg)   Lab Results  Component Value Date   CREATININE 1.8 (A) 04/26/2022   CREATININE 1.4 (A) 03/26/2022   CREATININE 1.49 (H) 03/01/2022   Physical Exam Vitals and nursing note reviewed.  Constitutional:      Appearance: Normal appearance.  HENT:     Head: Normocephalic and atraumatic.  Cardiovascular:     Rate and Rhythm: Normal rate. Rhythm irregular.  Pulmonary:     Effort: Pulmonary effort is normal. No respiratory distress.     Breath sounds: No wheezing or rales.  Abdominal:     General: There is no distension.     Palpations: Abdomen is soft.     Tenderness: There is no abdominal tenderness.     Hernia: A hernia is present.  Musculoskeletal:        General: No tenderness.     Cervical back: Normal range of motion and neck supple.     Right lower leg: No edema.      Left lower leg: No edema.  Skin:    General: Skin is warm and dry.     Comments: Reports wounds on bottom of both heels  Neurological:     General: No focal deficit present.     Mental Status: He is alert and oriented to person, place, and time.  Psychiatric:        Mood and Affect: Mood normal.        Behavior: Behavior normal.        Judgment: Judgment normal.   Assessment & Plan:  1: Chronic heart failure with reduced ejection fraction- - NYHA class II - euvolemic today - being weighed [facility]; reminded to call for an overnight weight gain of > 2 pounds or a weekly weight gain of > 5 pounds - weight up 9 pounds from last visit here 2 months ago - not adding salt to his food - currently receiving daily PT - unable to tolerate GDMT due to hypotension; had to stop taking farxiga - torsemide now PRN - BNP 02/25/22 was 1301.2  2: HTN with CKD- - BP 130/78  - currently seeing PCP at SNF  - BMP 04/26/22 reviewed and showed sodium 136, potassium 5.0, creatinine 1.8 & GFR 34  3: PAF- - saw cardiology ([facility]) 04/19/22 - currently taking apixaban   Facility medication list reviewed.   Due to HF stability, will not make a return appt at this time. Advised patient that he could call back at anytime for questions or to make another appt and he was comfortable with this plan.

## 2022-05-29 NOTE — Patient Instructions (Signed)
Continue weighing daily and call for an overnight weight gain of 3 pounds or more or a weekly weight gain of more than 5 pounds.   If you have voicemail, please make sure your mailbox is cleaned out so that we may leave a message and please make sure to listen to any voicemails.     

## 2022-05-30 ENCOUNTER — Ambulatory Visit: Payer: Medicare Other | Admitting: Dermatology

## 2022-05-30 ENCOUNTER — Encounter: Payer: Self-pay | Admitting: Pharmacy Technician

## 2022-05-30 VITALS — BP 124/76

## 2022-05-30 DIAGNOSIS — L578 Other skin changes due to chronic exposure to nonionizing radiation: Secondary | ICD-10-CM | POA: Diagnosis not present

## 2022-05-30 DIAGNOSIS — Z7189 Other specified counseling: Secondary | ICD-10-CM | POA: Diagnosis not present

## 2022-05-30 DIAGNOSIS — L570 Actinic keratosis: Secondary | ICD-10-CM | POA: Diagnosis not present

## 2022-05-30 MED ORDER — MUPIROCIN 2 % EX OINT: 1.0000 | TOPICAL_OINTMENT | Freq: Every day | CUTANEOUS | 0 refills | Status: AC

## 2022-05-30 NOTE — Patient Instructions (Signed)
Cryotherapy Aftercare  Wash gently with soap and water everyday.   Apply Vaseline and Band-Aid daily until healed.     Due to recent changes in healthcare laws, you may see results of your pathology and/or laboratory studies on MyChart before the doctors have had a chance to review them. We understand that in some cases there may be results that are confusing or concerning to you. Please understand that not all results are received at the same time and often the doctors may need to interpret multiple results in order to provide you with the best plan of care or course of treatment. Therefore, we ask that you please give us 2 business days to thoroughly review all your results before contacting the office for clarification. Should we see a critical lab result, you will be contacted sooner.   If You Need Anything After Your Visit  If you have any questions or concerns for your doctor, please call our main line at [phone] and press option 4 to reach your doctor's medical assistant. If no one answers, please leave a voicemail as directed and we will return your call as soon as possible. Messages left after 4 pm will be answered the following business day.   You may also send us a message via MyChart. We typically respond to MyChart messages within 1-2 business days.  For prescription refills, please ask your pharmacy to contact our office. Our fax number is [phone].  If you have an urgent issue when the clinic is closed that cannot wait until the next business day, you can page your doctor at the number below.    Please note that while we do our best to be available for urgent issues outside of office hours, we are not available 24/7.   If you have an urgent issue and are unable to reach us, you may choose to seek medical care at your doctor's office, retail clinic, urgent care center, or emergency room.  If you have a medical emergency, please immediately call 911 or go to the  emergency department.  Pager Numbers  - Dr. Kowalski: [phone]  - Dr. Moye: [phone]  - Dr. Stewart: [phone]  In the event of inclement weather, please call our main line at [phone] for an update on the status of any delays or closures.  Dermatology Medication Tips: Please keep the boxes that topical medications come in in order to help keep track of the instructions about where and how to use these. Pharmacies typically print the medication instructions only on the boxes and not directly on the medication tubes.   If your medication is too expensive, please contact our office at [phone] option 4 or send us a message through MyChart.   We are unable to tell what your co-pay for medications will be in advance as this is different depending on your insurance coverage. However, we may be able to find a substitute medication at lower cost or fill out paperwork to get insurance to cover a needed medication.   If a prior authorization is required to get your medication covered by your insurance company, please allow us 1-2 business days to complete this process.  Drug prices often vary depending on where the prescription is filled and some pharmacies may offer cheaper prices.  The website www.goodrx.com contains coupons for medications through different pharmacies. The prices here do not account for what the cost may be with help from insurance (it may be cheaper with your insurance), but the website can   give you the price if you did not use any insurance.  - You can print the associated coupon and take it with your prescription to the pharmacy.  - You may also stop by our office during regular business hours and pick up a GoodRx coupon card.  - If you need your prescription sent electronically to a different pharmacy, notify our office through [facility] MyChart or by phone at [phone] option 4.     Si Usted Necesita Algo Despus de Su Visita  Tambin puede  enviarnos un mensaje a travs de MyChart. Por lo general respondemos a los mensajes de MyChart en el transcurso de 1 a 2 das hbiles.  Para renovar recetas, por favor pida a su farmacia que se ponga en contacto con nuestra oficina. Nuestro nmero de fax es el [phone].  Si tiene un asunto urgente cuando la clnica est cerrada y que no puede esperar hasta el siguiente da hbil, puede llamar/localizar a su doctor(a) al nmero que aparece a continuacin.   Por favor, tenga en cuenta que aunque hacemos todo lo posible para estar disponibles para asuntos urgentes fuera del horario de oficina, no estamos disponibles las 24 horas del da, los 7 das de la semana.   Si tiene un problema urgente y no puede comunicarse con nosotros, puede optar por buscar atencin mdica  en el consultorio de su doctor(a), en una clnica privada, en un centro de atencin urgente o en una sala de emergencias.  Si tiene una emergencia mdica, por favor llame inmediatamente al 911 o vaya a la sala de emergencias.  Nmeros de bper  - Dr. Kowalski: [phone]  - Dra. Moye: [phone]  - Dra. Stewart: [phone]  En caso de inclemencias del tiempo, por favor llame a nuestra lnea principal al [phone] para una actualizacin sobre el estado de cualquier retraso o cierre.  Consejos para la medicacin en dermatologa: Por favor, guarde las cajas en las que vienen los medicamentos de uso tpico para ayudarle a seguir las instrucciones sobre dnde y cmo usarlos. Las farmacias generalmente imprimen las instrucciones del medicamento slo en las cajas y no directamente en los tubos del medicamento.   Si su medicamento es muy caro, por favor, pngase en contacto con nuestra oficina llamando al [phone] y presione la opcin 4 o envenos un mensaje a travs de MyChart.   No podemos decirle cul ser su copago por los medicamentos por adelantado ya que esto es diferente dependiendo de la cobertura de su seguro.  Sin embargo, es posible que podamos encontrar un medicamento sustituto a menor costo o llenar un formulario para que el seguro cubra el medicamento que se considera necesario.   Si se requiere una autorizacin previa para que su compaa de seguros cubra su medicamento, por favor permtanos de 1 a 2 das hbiles para completar este proceso.  Los precios de los medicamentos varan con frecuencia dependiendo del lugar de dnde se surte la receta y alguna farmacias pueden ofrecer precios ms baratos.  El sitio web www.goodrx.com tiene cupones para medicamentos de diferentes farmacias. Los precios aqu no tienen en cuenta lo que podra costar con la ayuda del seguro (puede ser ms barato con su seguro), pero el sitio web puede darle el precio si no utiliz ningn seguro.  - Puede imprimir el cupn correspondiente y llevarlo con su receta a la farmacia.  - Tambin puede pasar por nuestra oficina durante el horario de atencin regular y recoger una tarjeta de cupones de GoodRx.  -   Si necesita que su receta se enve electrnicamente a una farmacia diferente, informe a nuestra oficina a travs de MyChart de [facility] o por telfono llamando al [phone] y presione la opcin 4.  

## 2022-05-30 NOTE — Progress Notes (Signed)
[place] - PHARMACIST COUNSELING NOTE  Guideline-Directed Medical Therapy/Evidence Based Medicine  ACE/ARB/ARNI:  None Beta Blocker:  None Aldosterone Antagonist:  None Diuretic: Torsemide 20 mg daily SGLT2i:  None  Adherence Assessment  Do you ever forget to take your medication? '[]'$ Yes '[x]'$ No  Do you ever skip doses due to side effects? '[]'$ Yes '[x]'$ No  Do you have trouble affording your medicines? '[]'$ Yes '[x]'$ No  Are you ever unable to pick up your medication due to transportation difficulties? '[]'$ Yes '[x]'$ No  Do you ever stop taking your medications because you don't believe they are helping? '[]'$ Yes '[x]'$ No  Do you check your weight daily? '[x]'$ Yes '[]'$ No   Adherence strategy: Resident at [facility] who administers his medications  Barriers to obtaining medications: None  Vital signs: HR 81, BP 130/78, weight (pounds) 162 ECHO: Date 02/2022, EF 30-35%     Latest Ref Rng & Units 04/26/2022   12:00 AM 03/26/2022   12:00 AM 03/01/2022    6:25 AM  BMP  Glucose 70 - 99 mg/dL   99   BUN 4 - 21 43     39     35   Creatinine 0.6 - 1.3 1.8     1.4     1.49   Sodium 137 - 147 136     140     129   Potassium 3.5 - 5.1 mEq/L 5.0     4.5     3.3   Chloride 99 - 108 102     108     90   CO2 13 - '22 26     23     26   '$ Calcium 8.7 - 10.7 8.6     8.7     8.3      This result is from an external source.    Past Medical History:  Diagnosis Date   AAA (abdominal aortic aneurysm) ([place])    Actinic keratosis 07/22/2017   left lateral crown, midline crown, right of midline crown   Anemia    Aortic atherosclerosis (HCC)    Atrial fibrillation ([place]) 03/2016   brief spell   B12 deficiency    Basal cell carcinoma 10/30/2016   L lat crown   Bradycardia    Bradycardia 02/2017   Pacer placed   CHF (congestive heart failure) (HCC)    Chronic kidney disease (CKD), stage III (moderate) (HCC)    Depression    Hyperlipidemia    Hypertension     Hyponatremia    Hypothyroidism    Osteoarthritis    Prostate cancer ([place]) 2001   Rad tx's + seed implants   Renal cancer, left ([place]) 03/2016   Left Renal Nephrectomy   Sick sinus syndrome [facility])    Pacemaker    ASSESSMENT 89 year old male with PMH HTN, Afib, SSS, AAA, CKD3a, BPH, HLD, h/o falls who presents to the HF clinic for follow-up. Most recent ECHO in 02/2022 shows EF 30-35%. Regarding GDMT, patient was on Farxiga 5 mg daily but this was discontinued due to ongoing falls. Patient does take torsemide 20 mg as needed for a weight gain 3 lbs overnight or 5 lbs in seven days.  Recent ED Visit (past 6 months): Date - 02/2022, CC - Leg swelling  PLAN Reconciled medications using medication list from [facility].  CHF/HTN Continue torsemide 20 mg as needed for a weight gain 3 lbs overnight or 5 lbs in seven days. Low blood pressure and h/o  falls precludes onboarding of GDMT. Per discussion with [facility], no further heart failure needs at this point. This will likely be the last appointment at the heart failure clinic with this patient.  Time spent: 5 minutes  Will M. Ouida Sills, PharmD PGY-1 Pharmacy Resident 05/30/2022 7:22 AM   Current Outpatient Medications:    acetaminophen (TYLENOL) 650 MG CR tablet, Take 650 mg by mouth every 4 (four) hours as needed for pain., Disp: , Rfl:    Bismuth Subsalicylate (KAOPECTATE PO), Take 10 mLs by mouth daily as needed. For diarrhea, Disp: , Rfl:    carbamide peroxide (DEBROX) 6.5 % OTIC solution, Place 5 drops into both ears as needed., Disp: , Rfl:    collagenase (SANTYL) 250 UNIT/GM ointment, Apply to Bilateral hells topically every day shift for wound healing. Apply to bilateral heels 53m thickness then apply calcium alginate and cover with foam dressing., Disp: , Rfl:    Cyanocobalamin 1000 MCG TBCR, Take 1,000 mcg by mouth daily. , Disp: , Rfl:    dextromethorphan-guaiFENesin (ROBITUSSIN-DM) 10-100 MG/5ML liquid, Take 10 mLs by mouth  every 4 (four) hours as needed for cough., Disp: , Rfl:    ELIQUIS 2.5 MG TABS tablet, Take 2.5 mg by mouth 2 (two) times daily., Disp: , Rfl:    Ensure (ENSURE), Take 237 mLs by mouth 3 (three) times daily between meals., Disp: , Rfl:    ferrous sulfate 325 (65 FE) MG tablet, Take 1 tablet (325 mg total) by mouth daily with breakfast., Disp: 30 tablet, Rfl: 0   levothyroxine (SYNTHROID, LEVOTHROID) 112 MCG tablet, Take 112 mcg by mouth daily before breakfast., Disp: , Rfl:    magnesium hydroxide (MILK OF MAGNESIA) 400 MG/5ML suspension, Take 30 mLs by mouth daily as needed for mild constipation., Disp: , Rfl:    midodrine (PROAMATINE) 2.5 MG tablet, Take 2.5 mg by mouth daily., Disp: , Rfl:    Multiple Vitamin (MULTI-VITAMINS) TABS, Take 1 tablet by mouth daily., Disp: , Rfl:    mupirocin ointment (BACTROBAN) 2 %, 1 Application daily. Apply to scalp daily, Disp: , Rfl:    nystatin (MYCOSTATIN/NYSTOP) powder, Apply 1 Application topically as needed., Disp: , Rfl:    ondansetron (ZOFRAN) 4 MG tablet, Take 4 mg by mouth as needed for nausea or vomiting. Up to three times a day, Disp: , Rfl:    sertraline (ZOLOFT) 100 MG tablet, Take 100 mg by mouth daily., Disp: , Rfl:    torsemide (DEMADEX) 20 MG tablet, Take 20 mg by mouth as needed (wt gain 3 lbs overnight or 5 lbs in seven days)., Disp: , Rfl:    witch hazel-glycerin (TUCKS) pad, Apply topically as needed for itching., Disp: 40 each, Rfl: 0   Zinc Oxide (DESITIN) 13 % CREA, Apply topically. Apply to rectum topically ever 2 hours  needed, Disp: , Rfl:   DRUGS TO CAUTION IN HEART FAILURE  Drug or Class Mechanism  Analgesics NSAIDs COX-2 inhibitors Glucocorticoids  Sodium and water retention, increased systemic vascular resistance, decreased response to diuretics   Diabetes Medications Metformin Thiazolidinediones Rosiglitazone (Avandia) Pioglitazone (Actos) DPP4 Inhibitors Saxagliptin (Onglyza) Sitagliptin (Januvia)   Lactic  acidosis Possible calcium channel blockade   Unknown  Antiarrhythmics Class I  Flecainide Disopyramide Class III Sotalol Other Dronedarone  Negative inotrope, proarrhythmic   Proarrhythmic, beta blockade  Negative inotrope  Antihypertensives Alpha Blockers Doxazosin Calcium Channel Blockers Diltiazem Verapamil Nifedipine Central Alpha Adrenergics Moxonidine Peripheral Vasodilators Minoxidil  Increases renin and aldosterone  Negative inotrope  Possible sympathetic withdrawal  Unknown  Anti-infective Itraconazole Amphotericin B  Negative inotrope Unknown  Hematologic Anagrelide Cilostazol   Possible inhibition of PD IV Inhibition of PD III causing arrhythmias  Neurologic/Psychiatric Stimulants Anti-Seizure Drugs Carbamazepine Pregabalin Antidepressants Tricyclics Citalopram Parkinsons Bromocriptine Pergolide Pramipexole Antipsychotics Clozapine Antimigraine Ergotamine Methysergide Appetite suppressants Bipolar Lithium  Peripheral alpha and beta agonist activity  Negative inotrope and chronotrope Calcium channel blockade  Negative inotrope, proarrhythmic Dose-dependent QT prolongation  Excessive serotonin activity/valvular damage Excessive serotonin activity/valvular damage Unknown  IgE mediated hypersensitivy, calcium channel blockade  Excessive serotonin activity/valvular damage Excessive serotonin activity/valvular damage Valvular damage  Direct myofibrillar degeneration, adrenergic stimulation  Antimalarials Chloroquine Hydroxychloroquine Intracellular inhibition of lysosomal enzymes  Urologic Agents Alpha Blockers Doxazosin Prazosin Tamsulosin Terazosin  Increased renin and aldosterone  Adapted from Page Carleene Overlie, et al. "Drugs That May Cause or Exacerbate Heart Failure: A Scientific Statement from the American Heart  Association." Circulation 2016; 134:e32-e69. DOI: 10.1161/CIR.0000000000000426   MEDICATION  ADHERENCES TIPS AND STRATEGIES Taking medication as prescribed improves patient outcomes in heart failure (reduces hospitalizations, improves symptoms, increases survival) Side effects of medications can be managed by decreasing doses, switching agents, stopping drugs, or adding additional therapy. Please let someone in the [facility] know if you have having bothersome side effects so we can modify your regimen. Do not alter your medication regimen without talking to [place].  Medication reminders can help patients remember to take drugs on time. If you are missing or forgetting doses you can try linking behaviors, using pill boxes, or an electronic reminder like an alarm on your phone or an app. Some people can also get automated phone calls as medication reminders.

## 2022-05-30 NOTE — Progress Notes (Signed)
   Follow-Up Visit   Subjective  Frank Bartlett is a 89 y.o. male who presents for the following: Actinic Keratosis (7 month follow up - face, scalp and ears treated with LN2/). The patient has spots, moles and lesions to be evaluated, some may be new or changing and the patient has concerns that these could be cancer.  Accompanied by care giver  The following portions of the chart were reviewed this encounter and updated as appropriate:   Tobacco  Allergies  Meds  Problems  Med Hx  Surg Hx  Fam Hx     Review of Systems:  No other skin or systemic complaints except as noted in HPI or Assessment and Plan.  Objective  Well appearing patient in no apparent distress; mood and affect are within normal limits.  A focused examination was performed including scalp, face. Relevant physical exam findings are noted in the Assessment and Plan.  Right of midline middle scalp x 1, left crown x 1, left ear x 1 (3) 2.0 cm hyperkeratotic papule of tight of midline middle scalp. 2.5 cm hyperkeratotic papule of left crown. 0.6 cm hyperkeratotic papule of left ear.    Assessment & Plan   Actinic Damage - chronic, secondary to cumulative UV radiation exposure/sun exposure over time - diffuse scaly erythematous macules with underlying dyspigmentation - Recommend daily broad spectrum sunscreen SPF 30+ to sun-exposed areas, reapply every 2 hours as needed.  - Recommend staying in the shade or wearing long sleeves, sun glasses (UVA+UVB protection) and wide brim hats (4-inch brim around the entire circumference of the hat). - Call for new or changing lesions.  AK (actinic keratosis) (3) Right of midline middle scalp x 1, left crown x 1, left ear x 1 Hypertrophic Aks  Wash with soap and water and apply mupirocin daily until healed. May consider topical treatment vs biopsy on follow up if persistent.  mupirocin ointment (BACTROBAN) 2 % - Right of midline middle scalp x 1, left crown x 1, left  ear x 1 Apply 1 Application topically daily. To scalp and left ear until healed  Destruction of lesion - Right of midline middle scalp x 1, left crown x 1, left ear x 1 Complexity: simple   Destruction method: cryotherapy   Informed consent: discussed and consent obtained   Timeout:  patient name, date of birth, surgical site, and procedure verified Lesion destroyed using liquid nitrogen: Yes   Region frozen until ice ball extended beyond lesion: Yes   Outcome: patient tolerated procedure well with no complications   Post-procedure details: wound care instructions given    Return in about 2 months (around 07/29/2022) for AK follow up.  I, Ashok Cordia, CMA, am acting as scribe for Sarina Ser, MD . Documentation: I have reviewed the above documentation for accuracy and completeness, and I agree with the above.  Sarina Ser, MD

## 2022-05-31 ENCOUNTER — Non-Acute Institutional Stay (SKILLED_NURSING_FACILITY): Payer: Medicare Other | Admitting: Adult Health

## 2022-05-31 ENCOUNTER — Encounter: Payer: Self-pay | Admitting: Adult Health

## 2022-05-31 DIAGNOSIS — I509 Heart failure, unspecified: Secondary | ICD-10-CM | POA: Diagnosis not present

## 2022-05-31 DIAGNOSIS — E039 Hypothyroidism, unspecified: Secondary | ICD-10-CM | POA: Diagnosis not present

## 2022-05-31 DIAGNOSIS — I480 Paroxysmal atrial fibrillation: Secondary | ICD-10-CM | POA: Diagnosis not present

## 2022-05-31 DIAGNOSIS — R531 Weakness: Secondary | ICD-10-CM

## 2022-05-31 DIAGNOSIS — R2681 Unsteadiness on feet: Secondary | ICD-10-CM | POA: Diagnosis not present

## 2022-05-31 DIAGNOSIS — N1831 Chronic kidney disease, stage 3a: Secondary | ICD-10-CM | POA: Diagnosis not present

## 2022-05-31 DIAGNOSIS — F32A Depression, unspecified: Secondary | ICD-10-CM | POA: Diagnosis not present

## 2022-05-31 DIAGNOSIS — Z950 Presence of cardiac pacemaker: Secondary | ICD-10-CM | POA: Diagnosis not present

## 2022-05-31 DIAGNOSIS — M6281 Muscle weakness (generalized): Secondary | ICD-10-CM | POA: Diagnosis not present

## 2022-05-31 DIAGNOSIS — D649 Anemia, unspecified: Secondary | ICD-10-CM | POA: Diagnosis not present

## 2022-05-31 DIAGNOSIS — I110 Hypertensive heart disease with heart failure: Secondary | ICD-10-CM | POA: Diagnosis not present

## 2022-05-31 DIAGNOSIS — J9601 Acute respiratory failure with hypoxia: Secondary | ICD-10-CM | POA: Diagnosis not present

## 2022-05-31 DIAGNOSIS — R278 Other lack of coordination: Secondary | ICD-10-CM | POA: Diagnosis not present

## 2022-05-31 DIAGNOSIS — Z741 Need for assistance with personal care: Secondary | ICD-10-CM | POA: Diagnosis not present

## 2022-05-31 DIAGNOSIS — I951 Orthostatic hypotension: Secondary | ICD-10-CM | POA: Diagnosis not present

## 2022-05-31 DIAGNOSIS — I130 Hypertensive heart and chronic kidney disease with heart failure and stage 1 through stage 4 chronic kidney disease, or unspecified chronic kidney disease: Secondary | ICD-10-CM | POA: Diagnosis not present

## 2022-05-31 DIAGNOSIS — I495 Sick sinus syndrome: Secondary | ICD-10-CM | POA: Diagnosis not present

## 2022-05-31 DIAGNOSIS — I5023 Acute on chronic systolic (congestive) heart failure: Secondary | ICD-10-CM | POA: Diagnosis not present

## 2022-05-31 DIAGNOSIS — R262 Difficulty in walking, not elsewhere classified: Secondary | ICD-10-CM | POA: Diagnosis not present

## 2022-05-31 DIAGNOSIS — E871 Hypo-osmolality and hyponatremia: Secondary | ICD-10-CM | POA: Diagnosis not present

## 2022-05-31 DIAGNOSIS — R0602 Shortness of breath: Secondary | ICD-10-CM | POA: Diagnosis not present

## 2022-05-31 LAB — CBC AND DIFFERENTIAL
HCT: 40 — AB (ref 41–53)
Hemoglobin: 13.3 — AB (ref 13.5–17.5)
Platelets: 236 10*3/uL (ref 150–400)
WBC: 6.1

## 2022-05-31 LAB — BASIC METABOLIC PANEL
BUN: 36 — AB (ref 4–21)
CO2: 19 (ref 13–22)
Chloride: 109 — AB (ref 99–108)
Creatinine: 1.3 (ref 0.6–1.3)
Glucose: 138
Potassium: 4.5 mEq/L (ref 3.5–5.1)
Sodium: 141 (ref 137–147)

## 2022-05-31 LAB — COMPREHENSIVE METABOLIC PANEL
Calcium: 9.6 (ref 8.7–10.7)
eGFR: 50

## 2022-05-31 LAB — CBC: RBC: 4.22 (ref 3.87–5.11)

## 2022-05-31 NOTE — Progress Notes (Signed)
Location:   [facility] Room Number: 208A Place of Service:  SNF ([phone]) Provider:  Durenda Age, NP  Dewayne Shorter, MD  Patient Care Team: Dewayne Shorter, MD as PCP - General (Family Medicine) Lauree Chandler, NP as Nurse Practitioner (Geriatric Medicine)  Extended Emergency Contact Information Primary Emergency Contact: [facility] Address: [place] [address] [name] of [place] Phone: [phone] Mobile Phone: [phone] Relation: Son Secondary Emergency Contact: Leanora Ivanoff [place] of [place] Phone: [phone] Mobile Phone: [phone] Relation: Son  Code Status:  DNR Goals of care: Advanced Directive information    05/31/2022   12:38 PM  Advanced Directives  Does Patient Have a Medical Advance Directive? Yes  Type of Paramedic of [place];Living will;Out of facility DNR (pink MOST or yellow form)  Does patient want to make changes to medical advance directive? No - Patient declined  Copy of [place] in Chart? Yes - validated most recent copy scanned in chart (See row information)  Pre-existing out of facility DNR order (yellow form or pink MOST form) Pink MOST form placed in chart (order not valid for inpatient use);Yellow form placed in chart (order not valid for inpatient use)     Chief Complaint  Patient presents with   Acute Visit    Shortness of breath    HPI:  Pt is a 89 y.o. male seen today for an acute visit for shortness of breath. He is currently having PT and OT, and was noted to have 88% O2 sat after exercise. However, after resting, his O2 sat went up to 96% on room air. He complained of having productive cough and has tan colored phlegm.  He takes Midodrine 2.5 mg daily for Hypotension. Today BP 133/90.   Past Medical History:  Diagnosis Date   AAA (abdominal aortic aneurysm) ([place])    Actinic  keratosis 07/22/2017   left lateral crown, midline crown, right of midline crown   Anemia    Aortic atherosclerosis (HCC)    Atrial fibrillation ([place]) 03/2016   brief spell   B12 deficiency    Basal cell carcinoma 10/30/2016   L lat crown   Bradycardia    Bradycardia 02/2017   Pacer placed   CHF (congestive heart failure) (HCC)    Chronic kidney disease (CKD), stage III (moderate) (HCC)    Depression    Hyperlipidemia    Hypertension    Hyponatremia    Hypothyroidism    Osteoarthritis    Prostate cancer ([place]) 2001   Rad tx's + seed implants   Renal cancer, left ([place]) 03/2016   Left Renal Nephrectomy   Sick sinus syndrome ([place])    Pacemaker   Past Surgical History:  Procedure Laterality Date   CATARACT EXTRACTION, BILATERAL     COLONOSCOPY     EXCISIONAL HEMORRHOIDECTOMY     INSERTION PROSTATE RADIATION SEED     LAPAROSCOPIC NEPHRECTOMY, HAND ASSISTED Left 03/08/2016   Procedure: HAND ASSISTED LAPAROSCOPIC NEPHRECTOMY;  Surgeon: Nickie Retort, MD;  Location: [facility] ORS;  Service: Urology;  Laterality: Left;   LAPAROTOMY N/A 03/13/2016   Procedure: EXPLORATORY LAPAROTOMY;  Surgeon: Festus Aloe, MD;  Location: [facility] ORS;  Service: Urology;  Laterality: N/A;   PACEMAKER INSERTION N/A 02/20/2017   Procedure: INSERTION PACEMAKER;  Surgeon: Isaias Cowman, MD;  Location: [facility] ORS;  Service: Cardiovascular;  Laterality: N/A;    No Known Allergies  Allergies as of 05/31/2022   No Known Allergies      Medication List        Accurate as of May 31, 2022  5:11 PM. If you have any questions, ask your nurse or doctor.          acetaminophen 650 MG CR tablet Commonly known as: TYLENOL Take 650 mg by mouth every 4 (four) hours as needed for pain.   acetaminophen 500 MG tablet Commonly known as: TYLENOL Take 1,000 mg by mouth at bedtime.   carbamide peroxide 6.5 % OTIC solution Commonly known as: DEBROX Place 5 drops into both ears as needed.    collagenase 250 UNIT/GM ointment Commonly known as: SANTYL Apply to Bilateral hells topically every day shift for wound healing. Apply to bilateral heels 77m thickness then apply calcium alginate and cover with foam dressing.   Cyanocobalamin 1000 MCG Tbcr Take 1,000 mcg by mouth daily.   Desitin 13 % Crea Generic drug: Zinc Oxide Apply topically. Apply to rectum topically ever 2 hours  needed   dextromethorphan-guaiFENesin 10-100 MG/5ML liquid Commonly known as: ROBITUSSIN-DM Take 10 mLs by mouth every 4 (four) hours as needed for cough.   Eliquis 2.5 MG Tabs tablet Generic drug: apixaban Take 2.5 mg by mouth 2 (two) times daily.   Ensure Take 237 mLs by mouth 3 (three) times daily between meals.   ferrous sulfate 325 (65 FE) MG tablet Take 1 tablet (325 mg total) by mouth daily with breakfast.   KAOPECTATE PO Take 10 mLs by mouth daily as needed. For diarrhea   levothyroxine 112 MCG tablet Commonly known as: SYNTHROID Take 112 mcg by mouth daily before breakfast.   midodrine 2.5 MG tablet Commonly known as: PROAMATINE Take 2.5 mg by mouth daily.   Milk of Magnesia 400 MG/5ML suspension Generic drug: magnesium hydroxide Take 30 mLs by mouth daily as needed for mild constipation.   Multi-Vitamins Tabs Take 1 tablet by mouth daily.   mupirocin ointment 2 % Commonly known as: BACTROBAN 1 Application daily. Apply to scalp daily   mupirocin ointment 2 % Commonly known as: BACTROBAN Apply 1 Application topically daily. To scalp and left ear until healed   nystatin powder Commonly known as: MYCOSTATIN/NYSTOP Apply 1 Application topically as needed.   ondansetron 4 MG tablet Commonly known as: ZOFRAN Take 4 mg by mouth as needed for nausea or vomiting. Up to three times a day   sertraline 100 MG tablet Commonly known as: ZOLOFT Take 100 mg by mouth daily.   torsemide 20 MG tablet Commonly known as: DEMADEX Take 20 mg by mouth as needed (wt gain 3 lbs  overnight or 5 lbs in seven days).   witch hazel-glycerin pad Commonly known as: TUCKS Apply topically as needed for itching.        Review of Systems  Constitutional:  Negative for activity change, appetite change and fever.  HENT:  Negative for sore throat.   Eyes: Negative.   Cardiovascular:  Negative for chest pain and leg swelling.  Gastrointestinal:  Negative for abdominal distention, diarrhea and vomiting.  Genitourinary:  Negative for dysuria, frequency and urgency.  Skin:  Negative for color change.  Neurological:  Negative for dizziness and headaches.  Psychiatric/Behavioral:  Negative for behavioral problems and sleep disturbance. The patient is not nervous/anxious.     Immunization History  Administered Date(s) Administered   Influenza, High Dose Seasonal PF 01/25/2017, 03/04/2018, 02/21/2022   Influenza-Unspecified 03/14/2015, 02/21/2021   Moderna Sars-Covid-2 Vaccination 05/18/2019,  06/15/2019, 03/16/2022   Pneumococcal Conjugate-13 07/09/2014, 12/05/2015   Pneumococcal Polysaccharide-23 12/04/2016   Tdap 04/12/2018   Unspecified SARS-COV-2 Vaccination 10/03/2021   Pertinent  Health Maintenance Due  Topic Date Due   INFLUENZA VACCINE  Completed      02/26/2022    9:00 PM 02/27/2022    7:46 PM 02/28/2022    2:00 PM 02/28/2022    7:53 PM 03/01/2022    8:30 AM  Fall Risk  (RETIRED) Patient Fall Risk Level High fall risk High fall risk High fall risk High fall risk High fall risk   Functional Status Survey:    Vitals:   05/31/22 1230  BP: (!) 133/90  Pulse: 74  Resp: 20  Temp: (!) 97.3 F (36.3 C)  SpO2: 98%  Weight: 159 lb 3.2 oz (72.2 kg)  Height: '5\' 9"'$  (1.753 m)   Body mass index is 23.51 kg/m. Physical Exam Constitutional:      Appearance: Normal appearance.  HENT:     Head: Normocephalic and atraumatic.     Mouth/Throat:     Mouth: Mucous membranes are moist.  Eyes:     Conjunctiva/sclera: Conjunctivae normal.  Cardiovascular:      Rate and Rhythm: Normal rate and regular rhythm.     Pulses: Normal pulses.     Heart sounds: Normal heart sounds.     Comments: Left chest pacemaker Pulmonary:     Effort: Pulmonary effort is normal.     Breath sounds: Normal breath sounds.  Abdominal:     General: Bowel sounds are normal.     Palpations: Abdomen is soft.  Musculoskeletal:        General: No swelling.     Cervical back: Normal range of motion.     Comments: Right knee with brace  Skin:    General: Skin is warm and dry.  Neurological:     General: No focal deficit present.     Mental Status: He is alert and oriented to person, place, and time.  Psychiatric:        Mood and Affect: Mood normal.        Behavior: Behavior normal.        Thought Content: Thought content normal.        Judgment: Judgment normal.     Labs reviewed: Recent Labs    02/23/22 0715 02/23/22 1359 02/25/22 0542 02/26/22 0653 02/28/22 0433 03/01/22 0625 03/26/22 0000 04/26/22 0000  NA 126*   < > 129* 128* 127* 129* 140 136*  K 4.1   < > 3.5 3.3* 3.7 3.3* 4.5 5.0  CL 96*   < > 99 94* 91* 90* 108 102  CO2 21*   < > '23 24 26 26 '$ 23* 26*  GLUCOSE 101*   < > 109* 102* 100* 99  --   --   BUN 22   < > 20 18 28* 35* 39* 43*  CREATININE 1.15   < > 1.20 1.30* 1.37* 1.49* 1.4* 1.8*  CALCIUM 8.9   < > 8.2* 8.3* 8.0* 8.3* 8.7 8.6*  MG 2.0   < > 1.9 1.8 1.8  --   --   --   PHOS 3.4  --   --   --   --   --   --   --    < > = values in this interval not displayed.   Recent Labs    02/23/22 0715 03/26/22 0000  AST 31 12*  ALT 29 12  ALKPHOS  90 74  BILITOT 0.9  --   PROT 7.2  --   ALBUMIN 3.7 6.0*   Recent Labs    02/24/22 0829 02/25/22 0542 02/26/22 0653 02/28/22 0433 03/26/22 0000 04/26/22 0000  WBC 6.3 4.8 4.9 5.8 4.9 5.0  NEUTROABS 5.0  --   --   --  3,023.00 3,240.00  HGB 10.3* 9.4* 10.0* 9.7* 12.5* 12.6*  HCT 31.4* 27.7* 29.3* 28.9* 37* 38*  MCV 86.5 84.2 85.2 85.5  --   --   PLT 250 237 234 226 203 262   Lab Results   Component Value Date   TSH 8.962 (H) 02/23/2022   No results found for: "HGBA1C" No results found for: "CHOL", "HDL", "LDLCALC", "LDLDIRECT", "TRIG", "CHOLHDL"  Significant Diagnostic Results in last 30 days:  No results found.  Assessment/Plan  1. Acute respiratory failure with hypoxia (HCC) -  O2 sat dropped to 88% after exertion -  chest x-ray to rule out pneumonia                                                                      2. Weakness -  continue PT and OT -  fall precautions  3. Orthostatic hypotension -  continue Midodrine -  monitor BPs   Family/ staff Communication: Discussed plan of care with resident and charge news.   Labs/tests ordered:   COVID-19 test, chest x-ray test

## 2022-06-01 ENCOUNTER — Other Ambulatory Visit: Payer: Self-pay | Admitting: Student

## 2022-06-01 ENCOUNTER — Encounter: Payer: Self-pay | Admitting: Student

## 2022-06-01 ENCOUNTER — Non-Acute Institutional Stay (SKILLED_NURSING_FACILITY): Payer: Medicare Other | Admitting: Student

## 2022-06-01 DIAGNOSIS — I509 Heart failure, unspecified: Secondary | ICD-10-CM | POA: Diagnosis not present

## 2022-06-01 DIAGNOSIS — F331 Major depressive disorder, recurrent, moderate: Secondary | ICD-10-CM | POA: Diagnosis not present

## 2022-06-01 DIAGNOSIS — Z515 Encounter for palliative care: Secondary | ICD-10-CM

## 2022-06-01 DIAGNOSIS — Z66 Do not resuscitate: Secondary | ICD-10-CM

## 2022-06-01 DIAGNOSIS — I951 Orthostatic hypotension: Secondary | ICD-10-CM

## 2022-06-01 DIAGNOSIS — I480 Paroxysmal atrial fibrillation: Secondary | ICD-10-CM

## 2022-06-01 MED ORDER — MORPHINE SULFATE (CONCENTRATE) 20 MG/ML PO SOLN: 4.0000 mg | ORAL | 0 refills | Status: DC | PRN

## 2022-06-01 NOTE — Progress Notes (Signed)
Patient transitioning to hopsice status.

## 2022-06-01 NOTE — Progress Notes (Unsigned)
Location:  Other [facility].  Nursing Home Room Number: [place] of Service:  SNF [phone]) Provider:  Dewayne Shorter, MD  Patient Care Team: Dewayne Shorter, MD as PCP - General (Family Medicine) Lauree Chandler, NP as Nurse Practitioner (Geriatric Medicine)  Extended Emergency Contact Information Primary Emergency Contact: [facility] Address: [place] [address] [name] of [place] Phone: [phone] Mobile Phone: [phone] Relation: Son Secondary Emergency Contact: Leanora Ivanoff States of [place] Phone: [phone] Mobile Phone: [phone] Relation: Son  Code Status:  DNR Goals of care: Advanced Directive information    06/01/2022   11:11 AM  Advanced Directives  Does Patient Have a Medical Advance Directive? Yes  Type of Paramedic of [place];Out of facility DNR (pink MOST or yellow form);Living will  Does patient want to make changes to medical advance directive? No - Patient declined  Copy of [place] in Chart? No - copy requested     Chief Complaint  Patient presents with   Acute Visit    SOB    HPI:  Pt is a 89 y.o. male seen today for an acute visit for changes in breathing and coughing. Nursing had audio video recording (w/ patient permission) of breathing and coughing early in the morning. Typically when APP or physician have come to visit, patient has adequately recovered and is no longer in distress.   The last few days he has had a lot of coughing, difficulty breathing. He feels like it's in and around his lungs and partially his voice box. It doesn't feel the same as a cold with phlegm. When he eats and drinks too fast he stumbles around and has some trouble with couhging. He has been hallucinating recently. Last night he had the impression he went to a group meeting in this room and there were 4-5 people present. Sometimes his vision is  deceiving him with depth erception.   He has had other things that have been making him anxious and overwhelming and it "drives you crazy."   He has bowel movements 5x per day but they are formed.    Past Medical History:  Diagnosis Date   AAA (abdominal aortic aneurysm) ([place])    Actinic keratosis 07/22/2017   left lateral crown, midline crown, right of midline crown   Anemia    Aortic atherosclerosis (HCC)    Atrial fibrillation ([place]) 03/2016   brief spell   B12 deficiency    Basal cell carcinoma 10/30/2016   L lat crown   Bradycardia    Bradycardia 02/2017   Pacer placed   CHF (congestive heart failure) (HCC)    Chronic kidney disease (CKD), stage III (moderate) (HCC)    Depression    Hyperlipidemia    Hypertension    Hyponatremia    Hypothyroidism    Osteoarthritis    Prostate cancer ([place]) 2001   Rad tx's + seed implants   Renal cancer, left ([place]) 03/2016   Left Renal Nephrectomy   Sick sinus syndrome ([place])    Pacemaker   Past Surgical History:  Procedure Laterality Date   CATARACT EXTRACTION, BILATERAL     COLONOSCOPY     EXCISIONAL HEMORRHOIDECTOMY     INSERTION PROSTATE RADIATION SEED     LAPAROSCOPIC NEPHRECTOMY, HAND ASSISTED Left 03/08/2016   Procedure: HAND ASSISTED LAPAROSCOPIC NEPHRECTOMY;  Surgeon: Nickie Retort, MD;  Location: [facility] ORS;  Service: Urology;  Laterality: Left;   LAPAROTOMY N/A 03/13/2016   Procedure: EXPLORATORY LAPAROTOMY;  Surgeon: Festus Aloe, MD;  Location: [facility] ORS;  Service: Urology;  Laterality: N/A;   PACEMAKER INSERTION N/A 02/20/2017   Procedure: INSERTION PACEMAKER;  Surgeon: Isaias Cowman, MD;  Location: [facility] ORS;  Service: Cardiovascular;  Laterality: N/A;    No Known Allergies  Outpatient Encounter Medications as of 06/01/2022  Medication Sig   acetaminophen (TYLENOL) 500 MG tablet Take 1,000 mg by mouth at bedtime.   acetaminophen (TYLENOL) 650 MG CR tablet Take 650 mg by mouth every 4 (four) hours as  needed for pain.   Bismuth Subsalicylate (KAOPECTATE PO) Take 10 mLs by mouth daily as needed. For diarrhea   carbamide peroxide (DEBROX) 6.5 % OTIC solution Place 5 drops into both ears as needed.   collagenase (SANTYL) 250 UNIT/GM ointment Apply to Bilateral hells topically every day shift for wound healing. Apply to bilateral heels 45m thickness then apply calcium alginate and cover with foam dressing.   Cyanocobalamin 1000 MCG TBCR Take 1,000 mcg by mouth daily.    dextromethorphan-guaiFENesin (ROBITUSSIN-DM) 10-100 MG/5ML liquid Take 10 mLs by mouth every 4 (four) hours as needed for cough.   ELIQUIS 2.5 MG TABS tablet Take 2.5 mg by mouth 2 (two) times daily.   Ensure (ENSURE) Take 237 mLs by mouth 3 (three) times daily between meals.   ferrous sulfate 325 (65 FE) MG tablet Take 1 tablet (325 mg total) by mouth daily with breakfast.   levothyroxine (SYNTHROID, LEVOTHROID) 112 MCG tablet Take 112 mcg by mouth daily before breakfast.   magnesium hydroxide (MILK OF MAGNESIA) 400 MG/5ML suspension Take 30 mLs by mouth daily as needed for mild constipation.   midodrine (PROAMATINE) 2.5 MG tablet Take 2.5 mg by mouth daily.   Multiple Vitamin (MULTI-VITAMINS) TABS Take 1 tablet by mouth daily.   mupirocin ointment (BACTROBAN) 2 % 1 Application daily. Apply to scalp daily   mupirocin ointment (BACTROBAN) 2 % Apply 1 Application topically daily. To scalp and left ear until healed   nystatin (MYCOSTATIN/NYSTOP) powder Apply 1 Application topically as needed.   ondansetron (ZOFRAN) 4 MG tablet Take 4 mg by mouth as needed for nausea or vomiting. Up to three times a day   sertraline (ZOLOFT) 100 MG tablet Take 100 mg by mouth daily.   torsemide (DEMADEX) 20 MG tablet Take 20 mg by mouth as needed (wt gain 3 lbs overnight or 5 lbs in seven days).   witch hazel-glycerin (TUCKS) pad Apply topically as needed for itching.   Zinc Oxide (DESITIN) 13 % CREA Apply topically. Apply to rectum topically ever 2  hours  needed   morphine (ROXANOL) 20 MG/ML concentrated solution Take 0.2 mLs (4 mg total) by mouth every 2 (two) hours as needed for severe pain.   No facility-administered encounter medications on file as of 06/01/2022.    Review of Systems  Immunization History  Administered Date(s) Administered   Influenza, High Dose Seasonal PF 01/25/2017, 03/04/2018, 02/21/2022   Influenza-Unspecified 03/14/2015, 02/21/2021, 02/21/2022   Moderna Sars-Covid-2 Vaccination 05/18/2019, 06/15/2019, 03/16/2022   Pneumococcal Conjugate-13 07/09/2014, 12/05/2015   Pneumococcal Polysaccharide-23 12/04/2016   Tdap 04/12/2018   Unspecified SARS-COV-2 Vaccination 10/03/2021, 03/16/2022   Pertinent  Health Maintenance Due  Topic Date Due   INFLUENZA VACCINE  Completed      02/26/2022    9:00 PM 02/27/2022    7:46 PM 02/28/2022    2:00 PM 02/28/2022    7:53 PM 03/01/2022  8:30 AM  Fall Risk  (RETIRED) Patient Fall Risk Level High fall risk High fall risk High fall risk High fall risk High fall risk   Functional Status Survey:    Vitals:   06/01/22 1057  BP: (!) 133/90  Pulse: 74  Resp: 20  Temp: (!) 97.3 F (36.3 C)  SpO2: 98%  Weight: 159 lb 3.2 oz (72.2 kg)  Height: '5\' 9"'$  (1.753 m)   Body mass index is 23.51 kg/m. Physical Exam Constitutional:      Appearance: Normal appearance.  Cardiovascular:     Rate and Rhythm: Normal rate.     Pulses: Normal pulses.  Pulmonary:     Effort: Pulmonary effort is normal.     Breath sounds: Normal breath sounds.  Abdominal:     General: Bowel sounds are normal.     Palpations: Abdomen is soft.  Skin:    General: Skin is warm and dry.  Neurological:     Mental Status: He is alert and oriented to person, place, and time. Mental status is at baseline.     Labs reviewed: Recent Labs    02/23/22 0715 02/23/22 1359 02/25/22 0542 02/26/22 0653 02/28/22 0433 03/01/22 0625 03/26/22 0000 04/26/22 0000 05/31/22 0000  NA 126*   < > 129*  128* 127* 129* 140 136* 141  K 4.1   < > 3.5 3.3* 3.7 3.3* 4.5 5.0 4.5  CL 96*   < > 99 94* 91* 90* 108 102 109*  CO2 21*   < > '23 24 26 26 '$ 23* 26* 19  GLUCOSE 101*   < > 109* 102* 100* 99  --   --   --   BUN 22   < > 20 18 28* 35* 39* 43* 36*  CREATININE 1.15   < > 1.20 1.30* 1.37* 1.49* 1.4* 1.8* 1.3  CALCIUM 8.9   < > 8.2* 8.3* 8.0* 8.3* 8.7 8.6* 9.6  MG 2.0   < > 1.9 1.8 1.8  --   --   --   --   PHOS 3.4  --   --   --   --   --   --   --   --    < > = values in this interval not displayed.   Recent Labs    02/23/22 0715 03/26/22 0000  AST 31 12*  ALT 29 12  ALKPHOS 90 74  BILITOT 0.9  --   PROT 7.2  --   ALBUMIN 3.7 6.0*   Recent Labs    02/24/22 0829 02/25/22 0542 02/26/22 0653 02/28/22 0433 03/26/22 0000 04/26/22 0000 05/31/22 0000  WBC 6.3 4.8 4.9 5.8 4.9 5.0 6.1  NEUTROABS 5.0  --   --   --  3,023.00 3,240.00  --   HGB 10.3* 9.4* 10.0* 9.7* 12.5* 12.6* 13.3*  HCT 31.4* 27.7* 29.3* 28.9* 37* 38* 40*  MCV 86.5 84.2 85.2 85.5  --   --   --   PLT 250 237 234 226 203 262 236   Lab Results  Component Value Date   TSH 8.962 (H) 02/23/2022   No results found for: "HGBA1C" No results found for: "CHOL", "HDL", "LDLCALC", "LDLDIRECT", "TRIG", "CHOLHDL"  Significant Diagnostic Results in last 30 days:  No results found.  Assessment/Plan Hospice care  Chronic congestive heart failure, unspecified heart failure type (HCC)  Paroxysmal atrial fibrillation (HCC)  Moderate episode of recurrent major depressive disorder (HCC)  Orthostatic hypotension  Do not resuscitate Patient with CHF.  Labs yesterday within normal limits for BMP, CBC. CXR also unremarkable. Discussed concern with patient the symptoms are likely reflection of progression of his cardiac decline.  Chest x-ray showed some vascular congestion which could be reflection of increased volume despite clear lungs on auscultation.  Discussed with patient that often times when patients start to have symptoms  and we are not seeing leaking.  Of measures it becomes appropriate to start medications that can support to the symptoms such as morphine.  Patient stated he would like to try something that could help with his symptoms.  Discussed with patient this likely means he will transition to hospice level of care given his desires to sit remain comfortable and he endorsed understanding.  At this time we will plan to order morphine 4 mg every 4 hours as needed for shortness of breath and dyspnea.  Will order out Lasix 40 mg for diuresis in hopes of improving patient's breathing and cough.  Also will plan to discontinue midodrine given patient has had high blood pressure in the morning for the last 3 weeks since starting the medication.  Discussed this could be a risk factor of the medicine and he is in agreement for discontinuing this trial. Attempted to call [place], left VM on 1/27 AM.    Family/ staff Communication: Nursing  Labs/tests ordered:  none

## 2022-06-02 DIAGNOSIS — I5023 Acute on chronic systolic (congestive) heart failure: Secondary | ICD-10-CM | POA: Diagnosis not present

## 2022-06-02 DIAGNOSIS — R262 Difficulty in walking, not elsewhere classified: Secondary | ICD-10-CM | POA: Diagnosis not present

## 2022-06-02 DIAGNOSIS — F32A Depression, unspecified: Secondary | ICD-10-CM | POA: Diagnosis not present

## 2022-06-02 DIAGNOSIS — E871 Hypo-osmolality and hyponatremia: Secondary | ICD-10-CM | POA: Diagnosis not present

## 2022-06-02 DIAGNOSIS — N1831 Chronic kidney disease, stage 3a: Secondary | ICD-10-CM | POA: Diagnosis not present

## 2022-06-02 DIAGNOSIS — R278 Other lack of coordination: Secondary | ICD-10-CM | POA: Diagnosis not present

## 2022-06-02 DIAGNOSIS — Z950 Presence of cardiac pacemaker: Secondary | ICD-10-CM | POA: Diagnosis not present

## 2022-06-02 DIAGNOSIS — M6281 Muscle weakness (generalized): Secondary | ICD-10-CM | POA: Diagnosis not present

## 2022-06-02 DIAGNOSIS — R2681 Unsteadiness on feet: Secondary | ICD-10-CM | POA: Diagnosis not present

## 2022-06-02 DIAGNOSIS — I495 Sick sinus syndrome: Secondary | ICD-10-CM | POA: Diagnosis not present

## 2022-06-02 DIAGNOSIS — Z741 Need for assistance with personal care: Secondary | ICD-10-CM | POA: Diagnosis not present

## 2022-06-02 DIAGNOSIS — E039 Hypothyroidism, unspecified: Secondary | ICD-10-CM | POA: Diagnosis not present

## 2022-06-02 DIAGNOSIS — I110 Hypertensive heart disease with heart failure: Secondary | ICD-10-CM | POA: Diagnosis not present

## 2022-06-02 DIAGNOSIS — I480 Paroxysmal atrial fibrillation: Secondary | ICD-10-CM | POA: Diagnosis not present

## 2022-06-02 DIAGNOSIS — D649 Anemia, unspecified: Secondary | ICD-10-CM | POA: Diagnosis not present

## 2022-06-02 DIAGNOSIS — I130 Hypertensive heart and chronic kidney disease with heart failure and stage 1 through stage 4 chronic kidney disease, or unspecified chronic kidney disease: Secondary | ICD-10-CM | POA: Diagnosis not present

## 2022-06-04 DIAGNOSIS — I130 Hypertensive heart and chronic kidney disease with heart failure and stage 1 through stage 4 chronic kidney disease, or unspecified chronic kidney disease: Secondary | ICD-10-CM | POA: Diagnosis not present

## 2022-06-04 DIAGNOSIS — E871 Hypo-osmolality and hyponatremia: Secondary | ICD-10-CM | POA: Diagnosis not present

## 2022-06-04 DIAGNOSIS — Z950 Presence of cardiac pacemaker: Secondary | ICD-10-CM | POA: Diagnosis not present

## 2022-06-04 DIAGNOSIS — M6281 Muscle weakness (generalized): Secondary | ICD-10-CM | POA: Diagnosis not present

## 2022-06-04 DIAGNOSIS — I5023 Acute on chronic systolic (congestive) heart failure: Secondary | ICD-10-CM | POA: Diagnosis not present

## 2022-06-04 DIAGNOSIS — R278 Other lack of coordination: Secondary | ICD-10-CM | POA: Diagnosis not present

## 2022-06-04 DIAGNOSIS — R262 Difficulty in walking, not elsewhere classified: Secondary | ICD-10-CM | POA: Diagnosis not present

## 2022-06-04 DIAGNOSIS — E039 Hypothyroidism, unspecified: Secondary | ICD-10-CM | POA: Diagnosis not present

## 2022-06-04 DIAGNOSIS — I110 Hypertensive heart disease with heart failure: Secondary | ICD-10-CM | POA: Diagnosis not present

## 2022-06-04 DIAGNOSIS — I495 Sick sinus syndrome: Secondary | ICD-10-CM | POA: Diagnosis not present

## 2022-06-04 DIAGNOSIS — I480 Paroxysmal atrial fibrillation: Secondary | ICD-10-CM | POA: Diagnosis not present

## 2022-06-04 DIAGNOSIS — F32A Depression, unspecified: Secondary | ICD-10-CM | POA: Diagnosis not present

## 2022-06-04 DIAGNOSIS — R2681 Unsteadiness on feet: Secondary | ICD-10-CM | POA: Diagnosis not present

## 2022-06-04 DIAGNOSIS — N1831 Chronic kidney disease, stage 3a: Secondary | ICD-10-CM | POA: Diagnosis not present

## 2022-06-04 DIAGNOSIS — D649 Anemia, unspecified: Secondary | ICD-10-CM | POA: Diagnosis not present

## 2022-06-04 DIAGNOSIS — Z741 Need for assistance with personal care: Secondary | ICD-10-CM | POA: Diagnosis not present

## 2022-06-06 ENCOUNTER — Ambulatory Visit: Payer: Medicare Other | Admitting: Student

## 2022-06-06 ENCOUNTER — Non-Acute Institutional Stay (SKILLED_NURSING_FACILITY): Payer: Medicare Other | Admitting: Student

## 2022-06-06 ENCOUNTER — Encounter: Payer: Self-pay | Admitting: Student

## 2022-06-06 DIAGNOSIS — R278 Other lack of coordination: Secondary | ICD-10-CM | POA: Diagnosis not present

## 2022-06-06 DIAGNOSIS — E871 Hypo-osmolality and hyponatremia: Secondary | ICD-10-CM | POA: Diagnosis not present

## 2022-06-06 DIAGNOSIS — R262 Difficulty in walking, not elsewhere classified: Secondary | ICD-10-CM | POA: Diagnosis not present

## 2022-06-06 DIAGNOSIS — R051 Acute cough: Secondary | ICD-10-CM | POA: Diagnosis not present

## 2022-06-06 DIAGNOSIS — F32A Depression, unspecified: Secondary | ICD-10-CM | POA: Diagnosis not present

## 2022-06-06 DIAGNOSIS — Z950 Presence of cardiac pacemaker: Secondary | ICD-10-CM | POA: Diagnosis not present

## 2022-06-06 DIAGNOSIS — M6281 Muscle weakness (generalized): Secondary | ICD-10-CM | POA: Diagnosis not present

## 2022-06-06 DIAGNOSIS — I5023 Acute on chronic systolic (congestive) heart failure: Secondary | ICD-10-CM | POA: Diagnosis not present

## 2022-06-06 DIAGNOSIS — I130 Hypertensive heart and chronic kidney disease with heart failure and stage 1 through stage 4 chronic kidney disease, or unspecified chronic kidney disease: Secondary | ICD-10-CM | POA: Diagnosis not present

## 2022-06-06 DIAGNOSIS — I110 Hypertensive heart disease with heart failure: Secondary | ICD-10-CM | POA: Diagnosis not present

## 2022-06-06 DIAGNOSIS — D649 Anemia, unspecified: Secondary | ICD-10-CM | POA: Diagnosis not present

## 2022-06-06 DIAGNOSIS — I480 Paroxysmal atrial fibrillation: Secondary | ICD-10-CM | POA: Diagnosis not present

## 2022-06-06 DIAGNOSIS — Z741 Need for assistance with personal care: Secondary | ICD-10-CM | POA: Diagnosis not present

## 2022-06-06 DIAGNOSIS — N1831 Chronic kidney disease, stage 3a: Secondary | ICD-10-CM | POA: Diagnosis not present

## 2022-06-06 DIAGNOSIS — R2681 Unsteadiness on feet: Secondary | ICD-10-CM | POA: Diagnosis not present

## 2022-06-06 DIAGNOSIS — I495 Sick sinus syndrome: Secondary | ICD-10-CM | POA: Diagnosis not present

## 2022-06-06 DIAGNOSIS — E039 Hypothyroidism, unspecified: Secondary | ICD-10-CM | POA: Diagnosis not present

## 2022-06-06 NOTE — Progress Notes (Unsigned)
Location:  Other [facility].  Nursing Home Room Number: [place] of Service:  SNF [phone]) Provider:  Dewayne Shorter, MD  Patient Care Team: Dewayne Shorter, MD as PCP - General (Family Medicine) Lauree Chandler, NP as Nurse Practitioner (Geriatric Medicine)  Extended Emergency Contact Information Primary Emergency Contact: [facility] Address: [place] [address] [name] of [place] Phone: [phone] Mobile Phone: [phone] Relation: Son Secondary Emergency Contact: Leanora Ivanoff States of [place] Phone: [phone] Mobile Phone: [phone] Relation: Son  Code Status:  DNR Goals of care: Advanced Directive information    06/06/2022   11:08 AM  Advanced Directives  Does Patient Have a Medical Advance Directive? Yes  Type of Paramedic of [place];Out of facility DNR (pink MOST or yellow form);Living will  Does patient want to make changes to medical advance directive? No - Patient declined  Copy of [place] in Chart? Yes - validated most recent copy scanned in chart (See row information)     Chief Complaint  Patient presents with   Acute Visit    Cough    HPI:  Pt is a 89 y.o. male seen today for an acute visit for cough. Patient has continued to have cough. Yesterday he had a big day where he walked to two activities. He called his daughter last night to let her know he was doing so well. However, today he has been too tired to get up and do more acitivities. He has coughing "spells" that he feels start from the mid abdomen.    Past Medical History:  Diagnosis Date   AAA (abdominal aortic aneurysm) ([place])    Actinic keratosis 07/22/2017   left lateral crown, midline crown, right of midline crown   Anemia    Aortic atherosclerosis (HCC)    Atrial fibrillation ([place]) 03/2016   brief spell   B12 deficiency    Basal cell carcinoma 10/30/2016   L  lat crown   Bradycardia    Bradycardia 02/2017   Pacer placed   CHF (congestive heart failure) (HCC)    Chronic kidney disease (CKD), stage III (moderate) (HCC)    Depression    Hyperlipidemia    Hypertension    Hyponatremia    Hypothyroidism    Osteoarthritis    Prostate cancer ([place]) 2001   Rad tx's + seed implants   Renal cancer, left ([place]) 03/2016   Left Renal Nephrectomy   Sick sinus syndrome ([place])    Pacemaker   Past Surgical History:  Procedure Laterality Date   CATARACT EXTRACTION, BILATERAL     COLONOSCOPY     EXCISIONAL HEMORRHOIDECTOMY     INSERTION PROSTATE RADIATION SEED     LAPAROSCOPIC NEPHRECTOMY, HAND ASSISTED Left 03/08/2016   Procedure: HAND ASSISTED LAPAROSCOPIC NEPHRECTOMY;  Surgeon: Nickie Retort, MD;  Location: [facility] ORS;  Service: Urology;  Laterality: Left;   LAPAROTOMY N/A 03/13/2016   Procedure: EXPLORATORY LAPAROTOMY;  Surgeon: Festus Aloe, MD;  Location: [facility] ORS;  Service: Urology;  Laterality: N/A;   PACEMAKER INSERTION N/A 02/20/2017   Procedure: INSERTION PACEMAKER;  Surgeon: Isaias Cowman, MD;  Location: [facility] ORS;  Service: Cardiovascular;  Laterality: N/A;    No Known Allergies  Outpatient Encounter Medications as of 06/06/2022  Medication Sig   acetaminophen (TYLENOL) 500 MG tablet Take 1,000 mg by mouth at bedtime.   acetaminophen (TYLENOL) 650 MG CR tablet Take 650 mg  by mouth every 4 (four) hours as needed for pain.   Bismuth Subsalicylate (KAOPECTATE PO) Take 10 mLs by mouth daily as needed. For diarrhea   carbamide peroxide (DEBROX) 6.5 % OTIC solution Place 5 drops into both ears as needed.   collagenase (SANTYL) 250 UNIT/GM ointment Apply to Bilateral hells topically every day shift for wound healing. Apply to bilateral heels 9m thickness then apply calcium alginate and cover with foam dressing.   Cyanocobalamin 1000 MCG TBCR Take 1,000 mcg by mouth daily.    dextromethorphan-guaiFENesin (ROBITUSSIN-DM) 10-100 MG/5ML  liquid Take 10 mLs by mouth every 4 (four) hours as needed for cough.   ELIQUIS 2.5 MG TABS tablet Take 2.5 mg by mouth 2 (two) times daily.   Ensure (ENSURE) Take 237 mLs by mouth 3 (three) times daily between meals.   ferrous sulfate 325 (65 FE) MG tablet Take 1 tablet (325 mg total) by mouth daily with breakfast.   levothyroxine (SYNTHROID, LEVOTHROID) 112 MCG tablet Take 112 mcg by mouth daily before breakfast.   magnesium hydroxide (MILK OF MAGNESIA) 400 MG/5ML suspension Take 30 mLs by mouth daily as needed for mild constipation.   morphine (ROXANOL) 20 MG/ML concentrated solution Take 0.2 mLs (4 mg total) by mouth every 2 (two) hours as needed for severe pain.   Multiple Vitamin (MULTI-VITAMINS) TABS Take 1 tablet by mouth daily.   mupirocin ointment (BACTROBAN) 2 % 1 Application daily. Apply to scalp daily   mupirocin ointment (BACTROBAN) 2 % Apply 1 Application topically daily. To scalp and left ear until healed   nystatin (MYCOSTATIN/NYSTOP) powder Apply 1 Application topically as needed.   ondansetron (ZOFRAN) 4 MG tablet Take 4 mg by mouth as needed for nausea or vomiting. Up to three times a day   sertraline (ZOLOFT) 100 MG tablet Take 100 mg by mouth daily.   torsemide (DEMADEX) 20 MG tablet Take 20 mg by mouth as needed (wt gain 3 lbs overnight or 5 lbs in seven days).   witch hazel-glycerin (TUCKS) pad Apply topically as needed for itching.   Zinc Oxide (DESITIN) 13 % CREA Apply topically. Apply to rectum topically ever 2 hours  needed   [DISCONTINUED] midodrine (PROAMATINE) 2.5 MG tablet Take 2.5 mg by mouth daily.   No facility-administered encounter medications on file as of 06/06/2022.    Review of Systems  Immunization History  Administered Date(s) Administered   Influenza, High Dose Seasonal PF 01/25/2017, 03/04/2018, 02/21/2022   Influenza-Unspecified 03/14/2015, 02/21/2021, 02/21/2022   Moderna Sars-Covid-2 Vaccination 05/18/2019, 06/15/2019, 03/18/2020, 09/23/2020,  03/16/2022   Pneumococcal Conjugate-13 07/09/2014, 12/05/2015   Pneumococcal Polysaccharide-23 12/04/2016   Tdap 04/12/2018   Unspecified SARS-COV-2 Vaccination 01/27/2021, 10/03/2021, 03/16/2022   Pertinent  Health Maintenance Due  Topic Date Due   INFLUENZA VACCINE  Completed      02/26/2022    9:00 PM 02/27/2022    7:46 PM 02/28/2022    2:00 PM 02/28/2022    7:53 PM 03/01/2022    8:30 AM  Fall Risk  (RETIRED) Patient Fall Risk Level High fall risk High fall risk High fall risk High fall risk High fall risk   Functional Status Survey:    Vitals:   06/06/22 1101  BP: 125/83  Pulse: 61  Resp: 18  Temp: (!) 97.5 F (36.4 C)  SpO2: 98%  Weight: 159 lb 3.2 oz (72.2 kg)  Height: '5\' 9"'$  (1.753 m)   Body mass index is 23.51 kg/m. Physical Exam Constitutional:      Appearance: Normal  appearance.  Cardiovascular:     Rate and Rhythm: Normal rate.  Pulmonary:     Effort: Pulmonary effort is normal.  Abdominal:     General: Abdomen is flat.     Palpations: Abdomen is soft.  Neurological:     Mental Status: He is alert and oriented to person, place, and time.     Labs reviewed: Recent Labs    02/23/22 0715 02/23/22 1359 02/25/22 0542 02/26/22 0653 02/28/22 0433 03/01/22 0625 03/26/22 0000 04/26/22 0000 05/31/22 0000  NA 126*   < > 129* 128* 127* 129* 140 136* 141  K 4.1   < > 3.5 3.3* 3.7 3.3* 4.5 5.0 4.5  CL 96*   < > 99 94* 91* 90* 108 102 109*  CO2 21*   < > '23 24 26 26 '$ 23* 26* 19  GLUCOSE 101*   < > 109* 102* 100* 99  --   --   --   BUN 22   < > 20 18 28* 35* 39* 43* 36*  CREATININE 1.15   < > 1.20 1.30* 1.37* 1.49* 1.4* 1.8* 1.3  CALCIUM 8.9   < > 8.2* 8.3* 8.0* 8.3* 8.7 8.6* 9.6  MG 2.0   < > 1.9 1.8 1.8  --   --   --   --   PHOS 3.4  --   --   --   --   --   --   --   --    < > = values in this interval not displayed.   Recent Labs    02/23/22 0715 03/26/22 0000  AST 31 12*  ALT 29 12  ALKPHOS 90 74  BILITOT 0.9  --   PROT 7.2  --    ALBUMIN 3.7 6.0*   Recent Labs    02/24/22 0829 02/25/22 0542 02/26/22 0653 02/28/22 0433 03/26/22 0000 04/26/22 0000 05/31/22 0000  WBC 6.3 4.8 4.9 5.8 4.9 5.0 6.1  NEUTROABS 5.0  --   --   --  3,023.00 3,240.00  --   HGB 10.3* 9.4* 10.0* 9.7* 12.5* 12.6* 13.3*  HCT 31.4* 27.7* 29.3* 28.9* 37* 38* 40*  MCV 86.5 84.2 85.2 85.5  --   --   --   PLT 250 237 234 226 203 262 236   Lab Results  Component Value Date   TSH 8.962 (H) 02/23/2022   No results found for: "HGBA1C" No results found for: "CHOL", "HDL", "LDLCALC", "LDLDIRECT", "TRIG", "CHOLHDL"  Significant Diagnostic Results in last 30 days:  No results found.  Assessment/Plan 1. Acute cough Patient continues to have cough. Continue PRN morphine and lasix. CTM.    Family/ staff Communication: nurses  Labs/tests ordered:  none

## 2022-06-07 ENCOUNTER — Encounter: Payer: Self-pay | Admitting: Dermatology

## 2022-06-08 ENCOUNTER — Encounter: Payer: Self-pay | Admitting: Student

## 2022-06-08 ENCOUNTER — Non-Acute Institutional Stay (SKILLED_NURSING_FACILITY): Payer: Medicare Other | Admitting: Student

## 2022-06-08 DIAGNOSIS — F419 Anxiety disorder, unspecified: Secondary | ICD-10-CM

## 2022-06-08 DIAGNOSIS — Z515 Encounter for palliative care: Secondary | ICD-10-CM | POA: Diagnosis not present

## 2022-06-08 DIAGNOSIS — R441 Visual hallucinations: Secondary | ICD-10-CM

## 2022-06-08 DIAGNOSIS — R0602 Shortness of breath: Secondary | ICD-10-CM | POA: Diagnosis not present

## 2022-06-08 DIAGNOSIS — I509 Heart failure, unspecified: Secondary | ICD-10-CM

## 2022-06-08 DIAGNOSIS — R051 Acute cough: Secondary | ICD-10-CM | POA: Diagnosis not present

## 2022-06-08 NOTE — Progress Notes (Unsigned)
Location:   [place] Room Number: 208A Place of Service:  SNF [phone]) Provider:  Dewayne Shorter, MD  Dewayne Shorter, MD  Patient Care Team: Dewayne Shorter, MD as PCP - General (Family Medicine) Lauree Chandler, NP as Nurse Practitioner (Geriatric Medicine)  Extended Emergency Contact Information Primary Emergency Contact: [facility] Address: [place] [address] [name] of [place] Phone: [phone] Mobile Phone: [phone] Relation: Son Secondary Emergency Contact: Leanora Ivanoff States of [place] Phone: [phone] Mobile Phone: [phone] Relation: Son  Code Status:  DNR Goals of care: Advanced Directive information    06/08/2022   11:21 AM  Advanced Directives  Does Patient Have a Medical Advance Directive? Yes  Type of Paramedic of [place];Living will;Out of facility DNR (pink MOST or yellow form)  Does patient want to make changes to medical advance directive? No - Patient declined  Copy of [place] in Chart? Yes - validated most recent copy scanned in chart (See row information)  Pre-existing out of facility DNR order (yellow form or pink MOST form) Yellow form placed in chart (order not valid for inpatient use);Pink MOST form placed in chart (order not valid for inpatient use)     Chief Complaint  Patient presents with   Acute Visit    Hallucinations    HPI:  Pt is a 89 y.o. male seen today for an acute visit for   He has had hallucinations about spiders and webs in the room. He has seen people around him. He has felt his wife's presence more often. Having dreams about her and that she is nearby. He saw a new medication on the table that was out of reach and when he tried ot get it there was nothing there.   He has started to see his reading come to reality.   He has felt more anxious and is worries a lot. He  didn't go to breakfast because he has had the feeling he is going to be sick to his stomach.   Spoke with daughter Frank Bartlett and informed concern that these are signs of patient's demise and he may only have weeks to live. They are planning to have family visit more in the upcoming weeks.   Past Medical History:  Diagnosis Date   AAA (abdominal aortic aneurysm) ([place])    Actinic keratosis 07/22/2017   left lateral crown, midline crown, right of midline crown   Anemia    Aortic atherosclerosis (HCC)    Atrial fibrillation ([place]) 03/2016   brief spell   B12 deficiency    Basal cell carcinoma 10/30/2016   L lat crown   Bradycardia    Bradycardia 02/2017   Pacer placed   CHF (congestive heart failure) (HCC)    Chronic kidney disease (CKD), stage III (moderate) (HCC)    Depression    Hyperlipidemia    Hypertension    Hyponatremia    Hypothyroidism    Osteoarthritis    Prostate cancer ([place]) 2001   Rad tx's + seed implants   Renal cancer, left ([place]) 03/2016   Left Renal Nephrectomy   Sick sinus syndrome (HCC)    Pacemaker   Past Surgical History:  Procedure Laterality Date   CATARACT EXTRACTION, BILATERAL     COLONOSCOPY     EXCISIONAL HEMORRHOIDECTOMY     INSERTION PROSTATE RADIATION SEED     LAPAROSCOPIC NEPHRECTOMY, HAND  ASSISTED Left 03/08/2016   Procedure: HAND ASSISTED LAPAROSCOPIC NEPHRECTOMY;  Surgeon: Nickie Retort, MD;  Location: [facility] ORS;  Service: Urology;  Laterality: Left;   LAPAROTOMY N/A 03/13/2016   Procedure: EXPLORATORY LAPAROTOMY;  Surgeon: Festus Aloe, MD;  Location: [facility] ORS;  Service: Urology;  Laterality: N/A;   PACEMAKER INSERTION N/A 02/20/2017   Procedure: INSERTION PACEMAKER;  Surgeon: Isaias Cowman, MD;  Location: [facility] ORS;  Service: Cardiovascular;  Laterality: N/A;    No Known Allergies  Allergies as of 06/08/2022   No Known Allergies      Medication List        Accurate as of June 08, 2022 11:59 PM. If you  have any questions, ask your nurse or doctor.          acetaminophen 650 MG CR tablet Commonly known as: TYLENOL Take 650 mg by mouth every 4 (four) hours as needed for pain.   acetaminophen 500 MG tablet Commonly known as: TYLENOL Take 1,000 mg by mouth at bedtime.   carbamide peroxide 6.5 % OTIC solution Commonly known as: DEBROX Place 5 drops into both ears as needed.   collagenase 250 UNIT/GM ointment Commonly known as: SANTYL Apply to Bilateral hells topically every day shift for wound healing. Apply to bilateral heels 59m thickness then apply calcium alginate and cover with foam dressing.   Cyanocobalamin 1000 MCG Tbcr Take 1,000 mcg by mouth daily.   Desitin 13 % Crea Generic drug: Zinc Oxide Apply topically. Apply to rectum topically ever 2 hours  needed   dextromethorphan-guaiFENesin 10-100 MG/5ML liquid Commonly known as: ROBITUSSIN-DM Take 10 mLs by mouth every 4 (four) hours as needed for cough.   Eliquis 2.5 MG Tabs tablet Generic drug: apixaban Take 2.5 mg by mouth 2 (two) times daily.   Ensure Take 237 mLs by mouth 3 (three) times daily between meals.   ferrous sulfate 325 (65 FE) MG tablet Take 1 tablet (325 mg total) by mouth daily with breakfast.   KAOPECTATE PO Take 10 mLs by mouth daily as needed. For diarrhea   levothyroxine 112 MCG tablet Commonly known as: SYNTHROID Take 112 mcg by mouth daily before breakfast.   Milk of Magnesia 400 MG/5ML suspension Generic drug: magnesium hydroxide Take 30 mLs by mouth daily as needed for mild constipation.   morphine 20 MG/ML concentrated solution Commonly known as: ROXANOL Take 0.2 mLs (4 mg total) by mouth every 2 (two) hours as needed for severe pain.   Multi-Vitamins Tabs Take 1 tablet by mouth daily.   mupirocin ointment 2 % Commonly known as: BACTROBAN Apply 1 Application topically daily. To scalp and left ear until healed What changed: Another medication with the same name was removed.  Continue taking this medication, and follow the directions you see here. Changed by: VDewayne Shorter MD   nystatin powder Commonly known as: MYCOSTATIN/NYSTOP Apply 1 Application topically as needed.   ondansetron 4 MG tablet Commonly known as: ZOFRAN Take 4 mg by mouth as needed for nausea or vomiting. Up to three times a day   sertraline 100 MG tablet Commonly known as: ZOLOFT Take 100 mg by mouth daily.   torsemide 20 MG tablet Commonly known as: DEMADEX Take 20 mg by mouth as needed (wt gain 3 lbs overnight or 5 lbs in seven days).   witch hazel-glycerin pad Commonly known as: TUCKS Apply topically as needed for itching.        Review of Systems  Immunization History  Administered Date(s) Administered  Influenza, High Dose Seasonal PF 01/25/2017, 03/04/2018, 02/21/2022   Influenza-Unspecified 03/14/2015, 02/21/2021, 02/21/2022   Moderna Sars-Covid-2 Vaccination 05/18/2019, 06/15/2019, 03/18/2020, 09/23/2020, 03/16/2022   Pneumococcal Conjugate-13 07/09/2014, 12/05/2015   Pneumococcal Polysaccharide-23 12/04/2016   Tdap 04/12/2018   Unspecified SARS-COV-2 Vaccination 01/27/2021, 10/03/2021, 03/16/2022   Pertinent  Health Maintenance Due  Topic Date Due   INFLUENZA VACCINE  Completed      02/26/2022    9:00 PM 02/27/2022    7:46 PM 02/28/2022    2:00 PM 02/28/2022    7:53 PM 03/01/2022    8:30 AM  Fall Risk  (RETIRED) Patient Fall Risk Level High fall risk High fall risk High fall risk High fall risk High fall risk   Functional Status Survey:    Vitals:   06/08/22 1120  BP: 134/86  Pulse: 76  Resp: 18  Temp: (!) 97.3 F (36.3 C)  SpO2: 98%  Weight: 154 lb (69.9 kg)  Height: '5\' 9"'$  (1.753 m)   Body mass index is 22.74 kg/m. Physical Exam Cardiovascular:     Rate and Rhythm: Normal rate and regular rhythm.  Pulmonary:     Effort: Pulmonary effort is normal.     Breath sounds: Normal breath sounds.  Musculoskeletal:        General: No  swelling or tenderness.  Neurological:     Mental Status: He is alert.     Comments: Oriented, but having active visual hallucinations     Labs reviewed: Recent Labs    02/23/22 0715 02/23/22 1359 02/25/22 0542 02/26/22 0653 02/28/22 0433 03/01/22 0625 03/26/22 0000 04/26/22 0000 05/31/22 0000  NA 126*   < > 129* 128* 127* 129* 140 136* 141  K 4.1   < > 3.5 3.3* 3.7 3.3* 4.5 5.0 4.5  CL 96*   < > 99 94* 91* 90* 108 102 109*  CO2 21*   < > '23 24 26 26 '$ 23* 26* 19  GLUCOSE 101*   < > 109* 102* 100* 99  --   --   --   BUN 22   < > 20 18 28* 35* 39* 43* 36*  CREATININE 1.15   < > 1.20 1.30* 1.37* 1.49* 1.4* 1.8* 1.3  CALCIUM 8.9   < > 8.2* 8.3* 8.0* 8.3* 8.7 8.6* 9.6  MG 2.0   < > 1.9 1.8 1.8  --   --   --   --   PHOS 3.4  --   --   --   --   --   --   --   --    < > = values in this interval not displayed.   Recent Labs    02/23/22 0715 03/26/22 0000  AST 31 12*  ALT 29 12  ALKPHOS 90 74  BILITOT 0.9  --   PROT 7.2  --   ALBUMIN 3.7 6.0*   Recent Labs    02/24/22 0829 02/25/22 0542 02/26/22 0653 02/28/22 0433 03/26/22 0000 04/26/22 0000 05/31/22 0000  WBC 6.3 4.8 4.9 5.8 4.9 5.0 6.1  NEUTROABS 5.0  --   --   --  3,023.00 3,240.00  --   HGB 10.3* 9.4* 10.0* 9.7* 12.5* 12.6* 13.3*  HCT 31.4* 27.7* 29.3* 28.9* 37* 38* 40*  MCV 86.5 84.2 85.2 85.5  --   --   --   PLT 250 237 234 226 203 262 236   Lab Results  Component Value Date   TSH 8.962 (H) 02/23/2022   No results  found for: "HGBA1C" No results found for: "CHOL", "HDL", "LDLCALC", "LDLDIRECT", "TRIG", "CHOLHDL"  Significant Diagnostic Results in last 30 days:  No results found.  Assessment/Plan Visual hallucinations  Chronic congestive heart failure, unspecified heart failure type (HCC)  Shortness of breath  Acute cough  Hospice care Patient's symptoms are not improving despite additional diuresis. Discussed concern that the development of hallucinations is likely terminal delirium. He is  comforted by the wife, however, had some concerning hallucinations about war. Plan to start Seroquel 25 mg nightly and ativan 0.'5mg'$  TID prn for anxiety. Continue morphine prn for dyspnea.    Family/ staff Communication: family, nurses  Labs/tests ordered:

## 2022-06-09 DIAGNOSIS — E039 Hypothyroidism, unspecified: Secondary | ICD-10-CM | POA: Diagnosis not present

## 2022-06-09 DIAGNOSIS — Z741 Need for assistance with personal care: Secondary | ICD-10-CM | POA: Diagnosis not present

## 2022-06-09 DIAGNOSIS — N1831 Chronic kidney disease, stage 3a: Secondary | ICD-10-CM | POA: Diagnosis not present

## 2022-06-09 DIAGNOSIS — F32A Depression, unspecified: Secondary | ICD-10-CM | POA: Diagnosis not present

## 2022-06-09 DIAGNOSIS — I480 Paroxysmal atrial fibrillation: Secondary | ICD-10-CM | POA: Diagnosis not present

## 2022-06-09 DIAGNOSIS — I5023 Acute on chronic systolic (congestive) heart failure: Secondary | ICD-10-CM | POA: Diagnosis not present

## 2022-06-09 DIAGNOSIS — R278 Other lack of coordination: Secondary | ICD-10-CM | POA: Diagnosis not present

## 2022-06-09 DIAGNOSIS — M6281 Muscle weakness (generalized): Secondary | ICD-10-CM | POA: Diagnosis not present

## 2022-06-09 DIAGNOSIS — R2681 Unsteadiness on feet: Secondary | ICD-10-CM | POA: Diagnosis not present

## 2022-06-09 DIAGNOSIS — D649 Anemia, unspecified: Secondary | ICD-10-CM | POA: Diagnosis not present

## 2022-06-09 DIAGNOSIS — Z950 Presence of cardiac pacemaker: Secondary | ICD-10-CM | POA: Diagnosis not present

## 2022-06-09 DIAGNOSIS — I495 Sick sinus syndrome: Secondary | ICD-10-CM | POA: Diagnosis not present

## 2022-06-09 DIAGNOSIS — I110 Hypertensive heart disease with heart failure: Secondary | ICD-10-CM | POA: Diagnosis not present

## 2022-06-09 DIAGNOSIS — I130 Hypertensive heart and chronic kidney disease with heart failure and stage 1 through stage 4 chronic kidney disease, or unspecified chronic kidney disease: Secondary | ICD-10-CM | POA: Diagnosis not present

## 2022-06-09 DIAGNOSIS — E871 Hypo-osmolality and hyponatremia: Secondary | ICD-10-CM | POA: Diagnosis not present

## 2022-06-09 DIAGNOSIS — R262 Difficulty in walking, not elsewhere classified: Secondary | ICD-10-CM | POA: Diagnosis not present

## 2022-06-09 MED ORDER — LORAZEPAM 0.5 MG PO TABS: 0.5000 mg | ORAL_TABLET | Freq: Three times a day (TID) | ORAL | 0 refills | Status: DC | PRN

## 2022-06-12 DIAGNOSIS — R278 Other lack of coordination: Secondary | ICD-10-CM | POA: Diagnosis not present

## 2022-06-12 DIAGNOSIS — N1831 Chronic kidney disease, stage 3a: Secondary | ICD-10-CM | POA: Diagnosis not present

## 2022-06-12 DIAGNOSIS — I5023 Acute on chronic systolic (congestive) heart failure: Secondary | ICD-10-CM | POA: Diagnosis not present

## 2022-06-12 DIAGNOSIS — I130 Hypertensive heart and chronic kidney disease with heart failure and stage 1 through stage 4 chronic kidney disease, or unspecified chronic kidney disease: Secondary | ICD-10-CM | POA: Diagnosis not present

## 2022-06-12 DIAGNOSIS — Z950 Presence of cardiac pacemaker: Secondary | ICD-10-CM | POA: Diagnosis not present

## 2022-06-12 DIAGNOSIS — R262 Difficulty in walking, not elsewhere classified: Secondary | ICD-10-CM | POA: Diagnosis not present

## 2022-06-12 DIAGNOSIS — I495 Sick sinus syndrome: Secondary | ICD-10-CM | POA: Diagnosis not present

## 2022-06-12 DIAGNOSIS — D649 Anemia, unspecified: Secondary | ICD-10-CM | POA: Diagnosis not present

## 2022-06-12 DIAGNOSIS — E039 Hypothyroidism, unspecified: Secondary | ICD-10-CM | POA: Diagnosis not present

## 2022-06-12 DIAGNOSIS — M6281 Muscle weakness (generalized): Secondary | ICD-10-CM | POA: Diagnosis not present

## 2022-06-12 DIAGNOSIS — I110 Hypertensive heart disease with heart failure: Secondary | ICD-10-CM | POA: Diagnosis not present

## 2022-06-12 DIAGNOSIS — Z741 Need for assistance with personal care: Secondary | ICD-10-CM | POA: Diagnosis not present

## 2022-06-12 DIAGNOSIS — R2681 Unsteadiness on feet: Secondary | ICD-10-CM | POA: Diagnosis not present

## 2022-06-12 DIAGNOSIS — F32A Depression, unspecified: Secondary | ICD-10-CM | POA: Diagnosis not present

## 2022-06-12 DIAGNOSIS — I480 Paroxysmal atrial fibrillation: Secondary | ICD-10-CM | POA: Diagnosis not present

## 2022-06-12 DIAGNOSIS — E871 Hypo-osmolality and hyponatremia: Secondary | ICD-10-CM | POA: Diagnosis not present

## 2022-06-14 DIAGNOSIS — R262 Difficulty in walking, not elsewhere classified: Secondary | ICD-10-CM | POA: Diagnosis not present

## 2022-06-14 DIAGNOSIS — F32A Depression, unspecified: Secondary | ICD-10-CM | POA: Diagnosis not present

## 2022-06-14 DIAGNOSIS — N1831 Chronic kidney disease, stage 3a: Secondary | ICD-10-CM | POA: Diagnosis not present

## 2022-06-14 DIAGNOSIS — I495 Sick sinus syndrome: Secondary | ICD-10-CM | POA: Diagnosis not present

## 2022-06-14 DIAGNOSIS — E039 Hypothyroidism, unspecified: Secondary | ICD-10-CM | POA: Diagnosis not present

## 2022-06-14 DIAGNOSIS — Z950 Presence of cardiac pacemaker: Secondary | ICD-10-CM | POA: Diagnosis not present

## 2022-06-14 DIAGNOSIS — I480 Paroxysmal atrial fibrillation: Secondary | ICD-10-CM | POA: Diagnosis not present

## 2022-06-14 DIAGNOSIS — I5023 Acute on chronic systolic (congestive) heart failure: Secondary | ICD-10-CM | POA: Diagnosis not present

## 2022-06-14 DIAGNOSIS — R2681 Unsteadiness on feet: Secondary | ICD-10-CM | POA: Diagnosis not present

## 2022-06-14 DIAGNOSIS — D649 Anemia, unspecified: Secondary | ICD-10-CM | POA: Diagnosis not present

## 2022-06-14 DIAGNOSIS — I110 Hypertensive heart disease with heart failure: Secondary | ICD-10-CM | POA: Diagnosis not present

## 2022-06-14 DIAGNOSIS — I130 Hypertensive heart and chronic kidney disease with heart failure and stage 1 through stage 4 chronic kidney disease, or unspecified chronic kidney disease: Secondary | ICD-10-CM | POA: Diagnosis not present

## 2022-06-14 DIAGNOSIS — R278 Other lack of coordination: Secondary | ICD-10-CM | POA: Diagnosis not present

## 2022-06-14 DIAGNOSIS — M6281 Muscle weakness (generalized): Secondary | ICD-10-CM | POA: Diagnosis not present

## 2022-06-14 DIAGNOSIS — E871 Hypo-osmolality and hyponatremia: Secondary | ICD-10-CM | POA: Diagnosis not present

## 2022-06-14 DIAGNOSIS — Z741 Need for assistance with personal care: Secondary | ICD-10-CM | POA: Diagnosis not present

## 2022-06-15 ENCOUNTER — Non-Acute Institutional Stay (SKILLED_NURSING_FACILITY): Payer: Medicare Other | Admitting: Student

## 2022-06-15 ENCOUNTER — Encounter: Payer: Self-pay | Admitting: Student

## 2022-06-15 DIAGNOSIS — T50991A Poisoning by other drugs, medicaments and biological substances, accidental (unintentional), initial encounter: Secondary | ICD-10-CM

## 2022-06-15 DIAGNOSIS — I5023 Acute on chronic systolic (congestive) heart failure: Secondary | ICD-10-CM | POA: Diagnosis not present

## 2022-06-15 DIAGNOSIS — Z515 Encounter for palliative care: Secondary | ICD-10-CM

## 2022-06-15 NOTE — Progress Notes (Unsigned)
Location:  Other [place].  Nursing Home Room Number: Reserve of Service:  SNF [phone]) Provider:  Dewayne Shorter, MD  Patient Care Team: Dewayne Shorter, MD as PCP - General (Family Medicine) Lauree Chandler, NP as Nurse Practitioner (Geriatric Medicine)  Extended Emergency Contact Information Primary Emergency Contact: [facility] Address: [place] [address] [name] of [place] Phone: [phone] Mobile Phone: [phone] Relation: Son Secondary Emergency Contact: Leanora Ivanoff States of [place] Phone: [phone] Mobile Phone: [phone] Relation: Son  Code Status:  DNR Goals of care: Advanced Directive information    06/15/2022    9:54 AM  Advanced Directives  Does Patient Have a Medical Advance Directive? Yes  Type of Paramedic of [place];Living will;Out of facility DNR (pink MOST or yellow form)  Does patient want to make changes to medical advance directive? No - Patient declined  Copy of [place] in Chart? Yes - validated most recent copy scanned in chart (See row information)     Chief Complaint  Patient presents with   Acute Visit    Hospice Care    HPI:  Pt is a 89 y.o. male seen today for an acute visit for shaking and twitching since starting medication. Patient has been more confused and disoriented. He usually can name this author by name, however, calls by a different name. He is sitting in the shared living area because nursing is concerned about his safety without monitoring. He is doing taxes. He says today is better than yesterday. Asked about his children taking over finances, and he says, "well that would be nice." Denies shortness of breath.    Past Medical History:  Diagnosis Date   AAA (abdominal aortic aneurysm) ([place])    Actinic keratosis 07/22/2017   left lateral crown, midline crown, right of midline crown   Anemia     Aortic atherosclerosis (HCC)    Atrial fibrillation ([place]) 03/2016   brief spell   B12 deficiency    Basal cell carcinoma 10/30/2016   L lat crown   Bradycardia    Bradycardia 02/2017   Pacer placed   CHF (congestive heart failure) (HCC)    Chronic kidney disease (CKD), stage III (moderate) (HCC)    Depression    Hyperlipidemia    Hypertension    Hyponatremia    Hypothyroidism    Osteoarthritis    Prostate cancer ([place]) 2001   Rad tx's + seed implants   Renal cancer, left ([place]) 03/2016   Left Renal Nephrectomy   Sick sinus syndrome ([place])    Pacemaker   Past Surgical History:  Procedure Laterality Date   CATARACT EXTRACTION, BILATERAL     COLONOSCOPY     EXCISIONAL HEMORRHOIDECTOMY     INSERTION PROSTATE RADIATION SEED     LAPAROSCOPIC NEPHRECTOMY, HAND ASSISTED Left 03/08/2016   Procedure: HAND ASSISTED LAPAROSCOPIC NEPHRECTOMY;  Surgeon: Nickie Retort, MD;  Location: [facility] ORS;  Service: Urology;  Laterality: Left;   LAPAROTOMY N/A 03/13/2016   Procedure: EXPLORATORY LAPAROTOMY;  Surgeon: Festus Aloe, MD;  Location: [facility] ORS;  Service: Urology;  Laterality: N/A;   PACEMAKER INSERTION N/A 02/20/2017   Procedure: INSERTION PACEMAKER;  Surgeon: Isaias Cowman, MD;  Location: [facility] ORS;  Service: Cardiovascular;  Laterality: N/A;    No Known Allergies  Outpatient Encounter Medications as of 06/15/2022  Medication Sig   acetaminophen (TYLENOL) 500 MG tablet Take 1,000  mg by mouth at bedtime.   acetaminophen (TYLENOL) 650 MG CR tablet Take 650 mg by mouth every 4 (four) hours as needed for pain.   Bismuth Subsalicylate (KAOPECTATE PO) Take 10 mLs by mouth daily as needed. For diarrhea   carbamide peroxide (DEBROX) 6.5 % OTIC solution Place 5 drops into both ears as needed.   collagenase (SANTYL) 250 UNIT/GM ointment Apply to Bilateral hells topically every day shift for wound healing. Apply to bilateral heels 62m thickness then apply calcium alginate and cover with  foam dressing.   Cyanocobalamin 1000 MCG TBCR Take 1,000 mcg by mouth daily.    dextromethorphan-guaiFENesin (ROBITUSSIN-DM) 10-100 MG/5ML liquid Take 10 mLs by mouth every 4 (four) hours as needed for cough.   ELIQUIS 2.5 MG TABS tablet Take 2.5 mg by mouth 2 (two) times daily.   Ensure (ENSURE) Take 237 mLs by mouth 3 (three) times daily between meals.   ferrous sulfate 325 (65 FE) MG tablet Take 1 tablet (325 mg total) by mouth daily with breakfast.   levothyroxine (SYNTHROID, LEVOTHROID) 112 MCG tablet Take 112 mcg by mouth daily before breakfast.   LORazepam (ATIVAN) 0.5 MG tablet Take 0.5 mg by mouth daily. Give 0.270mby mouth every 8 hours as needed   magnesium hydroxide (MILK OF MAGNESIA) 400 MG/5ML suspension Take 30 mLs by mouth daily as needed for mild constipation.   morphine (ROXANOL) 20 MG/ML concentrated solution Take 0.2 mLs (4 mg total) by mouth every 2 (two) hours as needed for severe pain.   Multiple Vitamin (MULTI-VITAMINS) TABS Take 1 tablet by mouth daily.   mupirocin ointment (BACTROBAN) 2 % Apply 1 Application topically daily. To scalp and left ear until healed   nystatin (MYCOSTATIN/NYSTOP) powder Apply 1 Application topically as needed.   ondansetron (ZOFRAN) 4 MG tablet Take 4 mg by mouth every 8 (eight) hours as needed for nausea or vomiting. Up to three times a day   sertraline (ZOLOFT) 50 MG tablet Take 50 mg by mouth daily.   sodium hypochlorite (DAKIN'S 1/2 STRENGTH) external solution Apply to bilateral heels topically every day shift for wound care, cleanse wounds prior to dressing change.   torsemide (DEMADEX) 20 MG tablet Take 20 mg by mouth as needed (wt gain 3 lbs overnight or 5 lbs in seven days).   witch hazel-glycerin (TUCKS) pad Apply topically as needed for itching.   Zinc Oxide (DESITIN) 13 % CREA Apply topically. Apply to rectum topically ever 2 hours  needed   [DISCONTINUED] LORazepam (ATIVAN) 0.5 MG tablet Take 1 tablet (0.5 mg total) by mouth every 8  (eight) hours as needed for anxiety.   [DISCONTINUED] sertraline (ZOLOFT) 100 MG tablet Take 100 mg by mouth daily.   No facility-administered encounter medications on file as of 06/15/2022.    Review of Systems  Immunization History  Administered Date(s) Administered   Influenza, High Dose Seasonal PF 01/25/2017, 03/04/2018, 02/21/2022   Influenza-Unspecified 03/14/2015, 02/21/2021, 02/21/2022   Moderna Sars-Covid-2 Vaccination 05/18/2019, 06/15/2019, 03/18/2020, 09/23/2020, 03/16/2022   Pneumococcal Conjugate-13 07/09/2014, 12/05/2015   Pneumococcal Polysaccharide-23 12/04/2016   Tdap 04/12/2018   Unspecified SARS-COV-2 Vaccination 01/27/2021, 10/03/2021, 03/16/2022   Pertinent  Health Maintenance Due  Topic Date Due   INFLUENZA VACCINE  Completed      02/26/2022    9:00 PM 02/27/2022    7:46 PM 02/28/2022    2:00 PM 02/28/2022    7:53 PM 03/01/2022    8:30 AM  Fall Risk  (RETIRED) Patient Fall Risk Level High  fall risk High fall risk High fall risk High fall risk High fall risk   Functional Status Survey:    Vitals:   06/15/22 0943  BP: 127/79  Pulse: 85  Resp: (!) 22  Temp: (!) 97.1 F (36.2 C)  SpO2: 94%  Weight: 159 lb 9.6 oz (72.4 kg)  Height: 5' 9"$  (1.753 m)   Body mass index is 23.57 kg/m. Physical Exam Cardiovascular:     Rate and Rhythm: Normal rate.     Pulses: Normal pulses.  Abdominal:     General: Abdomen is flat.  Neurological:     Mental Status: He is alert.     Comments: Patient with uncontrollable movements at this time, jerking periodically. Oriented to self, location.      Labs reviewed: Recent Labs    02/23/22 0715 02/23/22 1359 02/25/22 0542 02/26/22 0653 02/28/22 0433 03/01/22 0625 03/26/22 0000 04/26/22 0000 05/31/22 0000  NA 126*   < > 129* 128* 127* 129* 140 136* 141  K 4.1   < > 3.5 3.3* 3.7 3.3* 4.5 5.0 4.5  CL 96*   < > 99 94* 91* 90* 108 102 109*  CO2 21*   < > 23 24 26 26 $ 23* 26* 19  GLUCOSE 101*   < > 109* 102*  100* 99  --   --   --   BUN 22   < > 20 18 28* 35* 39* 43* 36*  CREATININE 1.15   < > 1.20 1.30* 1.37* 1.49* 1.4* 1.8* 1.3  CALCIUM 8.9   < > 8.2* 8.3* 8.0* 8.3* 8.7 8.6* 9.6  MG 2.0   < > 1.9 1.8 1.8  --   --   --   --   PHOS 3.4  --   --   --   --   --   --   --   --    < > = values in this interval not displayed.   Recent Labs    02/23/22 0715 03/26/22 0000  AST 31 12*  ALT 29 12  ALKPHOS 90 74  BILITOT 0.9  --   PROT 7.2  --   ALBUMIN 3.7 6.0*   Recent Labs    02/24/22 0829 02/25/22 0542 02/26/22 0653 02/28/22 0433 03/26/22 0000 04/26/22 0000 05/31/22 0000  WBC 6.3 4.8 4.9 5.8 4.9 5.0 6.1  NEUTROABS 5.0  --   --   --  3,023.00 3,240.00  --   HGB 10.3* 9.4* 10.0* 9.7* 12.5* 12.6* 13.3*  HCT 31.4* 27.7* 29.3* 28.9* 37* 38* 40*  MCV 86.5 84.2 85.2 85.5  --   --   --   PLT 250 237 234 226 203 262 236   Lab Results  Component Value Date   TSH 8.962 (H) 02/23/2022   No results found for: "HGBA1C" No results found for: "CHOL", "HDL", "LDLCALC", "LDLDIRECT", "TRIG", "CHOLHDL"  Significant Diagnostic Results in last 30 days:  No results found.  Assessment/Plan Hospice care  Serotonin neurotoxicity, accidental or unintentional, initial encounter  Acute on chronic systolic CHF (congestive heart failure) ([place]) Patient with CHF and is likely in his end stages of life given recent increase in hallucinations. Discussed concern with nursing that patient may have neurotoxicity due to combination of sertraline and seroquel. Plan to discontinue seroquel. Decrease dose of sertraline. OTO for PRN ativan, and continue q8hours PRN. Advised nursing to provide ativan for symptoms.    Family/ staff Communication: nursing  Labs/tests ordered:  none

## 2022-06-16 DIAGNOSIS — M6281 Muscle weakness (generalized): Secondary | ICD-10-CM | POA: Diagnosis not present

## 2022-06-16 DIAGNOSIS — F32A Depression, unspecified: Secondary | ICD-10-CM | POA: Diagnosis not present

## 2022-06-16 DIAGNOSIS — R262 Difficulty in walking, not elsewhere classified: Secondary | ICD-10-CM | POA: Diagnosis not present

## 2022-06-16 DIAGNOSIS — I5023 Acute on chronic systolic (congestive) heart failure: Secondary | ICD-10-CM | POA: Diagnosis not present

## 2022-06-16 DIAGNOSIS — E871 Hypo-osmolality and hyponatremia: Secondary | ICD-10-CM | POA: Diagnosis not present

## 2022-06-16 DIAGNOSIS — Z950 Presence of cardiac pacemaker: Secondary | ICD-10-CM | POA: Diagnosis not present

## 2022-06-16 DIAGNOSIS — R2681 Unsteadiness on feet: Secondary | ICD-10-CM | POA: Diagnosis not present

## 2022-06-16 DIAGNOSIS — Z741 Need for assistance with personal care: Secondary | ICD-10-CM | POA: Diagnosis not present

## 2022-06-16 DIAGNOSIS — I110 Hypertensive heart disease with heart failure: Secondary | ICD-10-CM | POA: Diagnosis not present

## 2022-06-16 DIAGNOSIS — I130 Hypertensive heart and chronic kidney disease with heart failure and stage 1 through stage 4 chronic kidney disease, or unspecified chronic kidney disease: Secondary | ICD-10-CM | POA: Diagnosis not present

## 2022-06-16 DIAGNOSIS — I495 Sick sinus syndrome: Secondary | ICD-10-CM | POA: Diagnosis not present

## 2022-06-16 DIAGNOSIS — E039 Hypothyroidism, unspecified: Secondary | ICD-10-CM | POA: Diagnosis not present

## 2022-06-16 DIAGNOSIS — D649 Anemia, unspecified: Secondary | ICD-10-CM | POA: Diagnosis not present

## 2022-06-16 DIAGNOSIS — I480 Paroxysmal atrial fibrillation: Secondary | ICD-10-CM | POA: Diagnosis not present

## 2022-06-16 DIAGNOSIS — N1831 Chronic kidney disease, stage 3a: Secondary | ICD-10-CM | POA: Diagnosis not present

## 2022-06-16 DIAGNOSIS — R278 Other lack of coordination: Secondary | ICD-10-CM | POA: Diagnosis not present

## 2022-06-19 DIAGNOSIS — I130 Hypertensive heart and chronic kidney disease with heart failure and stage 1 through stage 4 chronic kidney disease, or unspecified chronic kidney disease: Secondary | ICD-10-CM | POA: Diagnosis not present

## 2022-06-19 DIAGNOSIS — R2681 Unsteadiness on feet: Secondary | ICD-10-CM | POA: Diagnosis not present

## 2022-06-19 DIAGNOSIS — I110 Hypertensive heart disease with heart failure: Secondary | ICD-10-CM | POA: Diagnosis not present

## 2022-06-19 DIAGNOSIS — E871 Hypo-osmolality and hyponatremia: Secondary | ICD-10-CM | POA: Diagnosis not present

## 2022-06-19 DIAGNOSIS — Z950 Presence of cardiac pacemaker: Secondary | ICD-10-CM | POA: Diagnosis not present

## 2022-06-19 DIAGNOSIS — I480 Paroxysmal atrial fibrillation: Secondary | ICD-10-CM | POA: Diagnosis not present

## 2022-06-19 DIAGNOSIS — D649 Anemia, unspecified: Secondary | ICD-10-CM | POA: Diagnosis not present

## 2022-06-19 DIAGNOSIS — M6281 Muscle weakness (generalized): Secondary | ICD-10-CM | POA: Diagnosis not present

## 2022-06-19 DIAGNOSIS — I5023 Acute on chronic systolic (congestive) heart failure: Secondary | ICD-10-CM | POA: Diagnosis not present

## 2022-06-19 DIAGNOSIS — R262 Difficulty in walking, not elsewhere classified: Secondary | ICD-10-CM | POA: Diagnosis not present

## 2022-06-19 DIAGNOSIS — Z741 Need for assistance with personal care: Secondary | ICD-10-CM | POA: Diagnosis not present

## 2022-06-19 DIAGNOSIS — F32A Depression, unspecified: Secondary | ICD-10-CM | POA: Diagnosis not present

## 2022-06-19 DIAGNOSIS — I495 Sick sinus syndrome: Secondary | ICD-10-CM | POA: Diagnosis not present

## 2022-06-19 DIAGNOSIS — N1831 Chronic kidney disease, stage 3a: Secondary | ICD-10-CM | POA: Diagnosis not present

## 2022-06-19 DIAGNOSIS — R278 Other lack of coordination: Secondary | ICD-10-CM | POA: Diagnosis not present

## 2022-06-19 DIAGNOSIS — E039 Hypothyroidism, unspecified: Secondary | ICD-10-CM | POA: Diagnosis not present

## 2022-06-21 DIAGNOSIS — I480 Paroxysmal atrial fibrillation: Secondary | ICD-10-CM | POA: Diagnosis not present

## 2022-06-21 DIAGNOSIS — I110 Hypertensive heart disease with heart failure: Secondary | ICD-10-CM | POA: Diagnosis not present

## 2022-06-21 DIAGNOSIS — D649 Anemia, unspecified: Secondary | ICD-10-CM | POA: Diagnosis not present

## 2022-06-21 DIAGNOSIS — R262 Difficulty in walking, not elsewhere classified: Secondary | ICD-10-CM | POA: Diagnosis not present

## 2022-06-21 DIAGNOSIS — N1831 Chronic kidney disease, stage 3a: Secondary | ICD-10-CM | POA: Diagnosis not present

## 2022-06-21 DIAGNOSIS — R278 Other lack of coordination: Secondary | ICD-10-CM | POA: Diagnosis not present

## 2022-06-21 DIAGNOSIS — I130 Hypertensive heart and chronic kidney disease with heart failure and stage 1 through stage 4 chronic kidney disease, or unspecified chronic kidney disease: Secondary | ICD-10-CM | POA: Diagnosis not present

## 2022-06-21 DIAGNOSIS — E871 Hypo-osmolality and hyponatremia: Secondary | ICD-10-CM | POA: Diagnosis not present

## 2022-06-21 DIAGNOSIS — I5023 Acute on chronic systolic (congestive) heart failure: Secondary | ICD-10-CM | POA: Diagnosis not present

## 2022-06-21 DIAGNOSIS — Z741 Need for assistance with personal care: Secondary | ICD-10-CM | POA: Diagnosis not present

## 2022-06-21 DIAGNOSIS — F32A Depression, unspecified: Secondary | ICD-10-CM | POA: Diagnosis not present

## 2022-06-21 DIAGNOSIS — R2681 Unsteadiness on feet: Secondary | ICD-10-CM | POA: Diagnosis not present

## 2022-06-21 DIAGNOSIS — I495 Sick sinus syndrome: Secondary | ICD-10-CM | POA: Diagnosis not present

## 2022-06-21 DIAGNOSIS — M6281 Muscle weakness (generalized): Secondary | ICD-10-CM | POA: Diagnosis not present

## 2022-06-21 DIAGNOSIS — Z950 Presence of cardiac pacemaker: Secondary | ICD-10-CM | POA: Diagnosis not present

## 2022-06-21 DIAGNOSIS — E039 Hypothyroidism, unspecified: Secondary | ICD-10-CM | POA: Diagnosis not present

## 2022-06-23 DIAGNOSIS — Z950 Presence of cardiac pacemaker: Secondary | ICD-10-CM | POA: Diagnosis not present

## 2022-06-23 DIAGNOSIS — I130 Hypertensive heart and chronic kidney disease with heart failure and stage 1 through stage 4 chronic kidney disease, or unspecified chronic kidney disease: Secondary | ICD-10-CM | POA: Diagnosis not present

## 2022-06-23 DIAGNOSIS — E039 Hypothyroidism, unspecified: Secondary | ICD-10-CM | POA: Diagnosis not present

## 2022-06-23 DIAGNOSIS — I495 Sick sinus syndrome: Secondary | ICD-10-CM | POA: Diagnosis not present

## 2022-06-23 DIAGNOSIS — R2681 Unsteadiness on feet: Secondary | ICD-10-CM | POA: Diagnosis not present

## 2022-06-23 DIAGNOSIS — I110 Hypertensive heart disease with heart failure: Secondary | ICD-10-CM | POA: Diagnosis not present

## 2022-06-23 DIAGNOSIS — N1831 Chronic kidney disease, stage 3a: Secondary | ICD-10-CM | POA: Diagnosis not present

## 2022-06-23 DIAGNOSIS — R262 Difficulty in walking, not elsewhere classified: Secondary | ICD-10-CM | POA: Diagnosis not present

## 2022-06-23 DIAGNOSIS — Z741 Need for assistance with personal care: Secondary | ICD-10-CM | POA: Diagnosis not present

## 2022-06-23 DIAGNOSIS — I5023 Acute on chronic systolic (congestive) heart failure: Secondary | ICD-10-CM | POA: Diagnosis not present

## 2022-06-23 DIAGNOSIS — E871 Hypo-osmolality and hyponatremia: Secondary | ICD-10-CM | POA: Diagnosis not present

## 2022-06-23 DIAGNOSIS — R278 Other lack of coordination: Secondary | ICD-10-CM | POA: Diagnosis not present

## 2022-06-23 DIAGNOSIS — I480 Paroxysmal atrial fibrillation: Secondary | ICD-10-CM | POA: Diagnosis not present

## 2022-06-23 DIAGNOSIS — F32A Depression, unspecified: Secondary | ICD-10-CM | POA: Diagnosis not present

## 2022-06-23 DIAGNOSIS — D649 Anemia, unspecified: Secondary | ICD-10-CM | POA: Diagnosis not present

## 2022-06-23 DIAGNOSIS — M6281 Muscle weakness (generalized): Secondary | ICD-10-CM | POA: Diagnosis not present

## 2022-06-26 DIAGNOSIS — Z950 Presence of cardiac pacemaker: Secondary | ICD-10-CM | POA: Diagnosis not present

## 2022-06-26 DIAGNOSIS — I5023 Acute on chronic systolic (congestive) heart failure: Secondary | ICD-10-CM | POA: Diagnosis not present

## 2022-06-26 DIAGNOSIS — M6281 Muscle weakness (generalized): Secondary | ICD-10-CM | POA: Diagnosis not present

## 2022-06-26 DIAGNOSIS — F32A Depression, unspecified: Secondary | ICD-10-CM | POA: Diagnosis not present

## 2022-06-26 DIAGNOSIS — R262 Difficulty in walking, not elsewhere classified: Secondary | ICD-10-CM | POA: Diagnosis not present

## 2022-06-26 DIAGNOSIS — R278 Other lack of coordination: Secondary | ICD-10-CM | POA: Diagnosis not present

## 2022-06-26 DIAGNOSIS — I480 Paroxysmal atrial fibrillation: Secondary | ICD-10-CM | POA: Diagnosis not present

## 2022-06-26 DIAGNOSIS — E039 Hypothyroidism, unspecified: Secondary | ICD-10-CM | POA: Diagnosis not present

## 2022-06-26 DIAGNOSIS — N1831 Chronic kidney disease, stage 3a: Secondary | ICD-10-CM | POA: Diagnosis not present

## 2022-06-26 DIAGNOSIS — D649 Anemia, unspecified: Secondary | ICD-10-CM | POA: Diagnosis not present

## 2022-06-26 DIAGNOSIS — R2681 Unsteadiness on feet: Secondary | ICD-10-CM | POA: Diagnosis not present

## 2022-06-26 DIAGNOSIS — I110 Hypertensive heart disease with heart failure: Secondary | ICD-10-CM | POA: Diagnosis not present

## 2022-06-26 DIAGNOSIS — Z741 Need for assistance with personal care: Secondary | ICD-10-CM | POA: Diagnosis not present

## 2022-06-26 DIAGNOSIS — E871 Hypo-osmolality and hyponatremia: Secondary | ICD-10-CM | POA: Diagnosis not present

## 2022-06-26 DIAGNOSIS — I495 Sick sinus syndrome: Secondary | ICD-10-CM | POA: Diagnosis not present

## 2022-06-26 DIAGNOSIS — I130 Hypertensive heart and chronic kidney disease with heart failure and stage 1 through stage 4 chronic kidney disease, or unspecified chronic kidney disease: Secondary | ICD-10-CM | POA: Diagnosis not present

## 2022-06-27 ENCOUNTER — Encounter: Payer: Self-pay | Admitting: Student

## 2022-06-27 ENCOUNTER — Non-Acute Institutional Stay (SKILLED_NURSING_FACILITY): Admitting: Student

## 2022-06-27 DIAGNOSIS — Z515 Encounter for palliative care: Secondary | ICD-10-CM

## 2022-06-27 DIAGNOSIS — I5023 Acute on chronic systolic (congestive) heart failure: Secondary | ICD-10-CM | POA: Diagnosis not present

## 2022-06-27 MED ORDER — HALOPERIDOL LACTATE 2 MG/ML PO CONC: 5.0000 mg | Freq: Four times a day (QID) | ORAL | 0 refills | Status: AC

## 2022-06-27 NOTE — Progress Notes (Signed)
Location:  Other [place] [facility].  Nursing Home Room Number: [place] of Service:  SNF [phone]) Provider:  Dewayne Shorter, MD  Patient Care Team: Dewayne Shorter, MD as PCP - General (Family Medicine) Lauree Chandler, NP as Nurse Practitioner (Geriatric Medicine)  Extended Emergency Contact Information Primary Emergency Contact: [facility] Address: [place] [address] [name] of [place] Phone: [phone] Mobile Phone: [phone] Relation: Son Secondary Emergency Contact: Leanora Ivanoff [place] of [place] Phone: [phone] Mobile Phone: [phone] Relation: Son  Code Status:  DNR Goals of care: Advanced Directive information    06/27/2022    3:00 PM  Advanced Directives  Does Patient Have a Medical Advance Directive? Yes  Type of Paramedic of [place];Out of facility DNR (pink MOST or yellow form);Living will  Does patient want to make changes to medical advance directive? No - Patient declined  Copy of [place] in Chart? Yes - validated most recent copy scanned in chart (See row information)     Chief Complaint  Patient presents with   Acute Visit    Hospice Care.     HPI:  Pt is a 89 y.o. male seen today for an acute visit for hospice symptom management. Nursing with concern that patient has had significant decline in the last 4 hours since lunch. He has been difficult to keep comfortable despite comfort medications.   Called daughter to inform of patient's decline. She was able to speak to him. She still plans to come to [place] on Saturday.   Contacted hospice attending to aid with symptom management since treatments have not been adequate for symptoms management.    Past Medical History:  Diagnosis Date   AAA (abdominal aortic aneurysm) ([place])    Actinic keratosis 07/22/2017   left lateral crown, midline crown, right of midline crown   Anemia     Aortic atherosclerosis (HCC)    Atrial fibrillation ([place]) 03/2016   brief spell   B12 deficiency    Basal cell carcinoma 10/30/2016   L lat crown   Bradycardia    Bradycardia 02/2017   Pacer placed   CHF (congestive heart failure) (HCC)    Chronic kidney disease (CKD), stage III (moderate) (HCC)    Depression    Hyperlipidemia    Hypertension    Hyponatremia    Hypothyroidism    Osteoarthritis    Prostate cancer ([place]) 2001   Rad tx's + seed implants   Renal cancer, left ([place]) 03/2016   Left Renal Nephrectomy   Sick sinus syndrome ([place])    Pacemaker   Past Surgical History:  Procedure Laterality Date   CATARACT EXTRACTION, BILATERAL     COLONOSCOPY     EXCISIONAL HEMORRHOIDECTOMY     INSERTION PROSTATE RADIATION SEED     LAPAROSCOPIC NEPHRECTOMY, HAND ASSISTED Left 03/08/2016   Procedure: HAND ASSISTED LAPAROSCOPIC NEPHRECTOMY;  Surgeon: Nickie Retort, MD;  Location: [facility] ORS;  Service: Urology;  Laterality: Left;   LAPAROTOMY N/A 03/13/2016   Procedure: EXPLORATORY LAPAROTOMY;  Surgeon: Festus Aloe, MD;  Location: [facility] OR[facility];  Service: Urology;  Laterality: N/A;   PACEMAKER INSERTION N/A 02/20/2017   Procedure: INSERTION PACEMAKER;  Surgeon: Isaias Cowman, MD;  Location: [facility] OR[facility];  Service: Cardiovascular;  Laterality: N/A;    No Known Allergies  Outpatient Encounter Medications as of 06/27/2022  Medication Sig   acetaminophen (TYLENOL) 500 MG tablet Take 1,000  mg by mouth at bedtime.   acetaminophen (TYLENOL) 650 MG CR tablet Take 650 mg by mouth every 4 (four) hours as needed for pain.   Bismuth Subsalicylate (KAOPECTATE PO) Take 10 mLs by mouth daily as needed. For diarrhea   carbamide peroxide (DEBROX) 6.5 % OTIC solution Place 5 drops into both ears as needed.   collagenase (SANTYL) 250 UNIT/GM ointment Apply to Bilateral hells topically every day shift for wound healing. Apply to bilateral heels 63m thickness then apply calcium alginate and cover with  foam dressing.   Cyanocobalamin 1000 MCG TBCR Take 1,000 mcg by mouth daily.    dextromethorphan-guaiFENesin (ROBITUSSIN-DM) 10-100 MG/5ML liquid Take 10 mLs by mouth every 4 (four) hours as needed for cough.   ELIQUIS 2.5 MG TABS tablet Take 2.5 mg by mouth 2 (two) times daily.   Ensure (ENSURE) Take 237 mLs by mouth 3 (three) times daily between meals.   ferrous sulfate 325 (65 FE) MG tablet Take 1 tablet (325 mg total) by mouth daily with breakfast.   levothyroxine (SYNTHROID, LEVOTHROID) 112 MCG tablet Take 112 mcg by mouth daily before breakfast.   LORazepam (ATIVAN) 0.5 MG tablet Take 0.5 mg by mouth every 8 (eight) hours as needed for anxiety.   magnesium hydroxide (MILK OF MAGNESIA) 400 MG/5ML suspension Take 30 mLs by mouth daily as needed for mild constipation.   Morphine Sulfate (MORPHINE CONCENTRATE) 10 mg / 0.5 ml concentrated solution Take 0.2 mLs by mouth every 4 (four) hours as needed for severe pain.   Multiple Vitamin (MULTI-VITAMINS) TABS Take 1 tablet by mouth daily.   mupirocin ointment (BACTROBAN) 2 % Apply 1 Application topically daily. To scalp and left ear until healed   nystatin (MYCOSTATIN/NYSTOP) powder Apply 1 Application topically as needed.   ondansetron (ZOFRAN) 4 MG tablet Take 4 mg by mouth every 8 (eight) hours as needed for nausea or vomiting. Up to three times a day   sertraline (ZOLOFT) 50 MG tablet Take 50 mg by mouth daily.   sodium hypochlorite (DAKIN'S 1/2 STRENGTH) external solution Apply to bilateral heels topically every day shift for wound care, cleanse wounds prior to dressing change.   torsemide (DEMADEX) 20 MG tablet Take 20 mg by mouth as needed (wt gain 3 lbs overnight or 5 lbs in seven days).   witch hazel-glycerin (TUCKS) pad Apply topically as needed for itching.   Zinc Oxide (DESITIN) 13 % CREA Apply topically. Apply to rectum topically ever 2 hours  needed   [DISCONTINUED] LORazepam (ATIVAN) 0.5 MG tablet Take 0.5 mg by mouth daily. Give  0.234mby mouth every 8 hours as needed   [DISCONTINUED] morphine (ROXANOL) 20 MG/ML concentrated solution Take 0.2 mLs (4 mg total) by mouth every 2 (two) hours as needed for severe pain.   No facility-administered encounter medications on file as of 06/27/2022.    Review of Systems  All other systems reviewed and are negative.   Immunization History  Administered Date(s) Administered   Influenza, High Dose Seasonal PF 01/25/2017, 03/04/2018, 02/21/2022   Influenza-Unspecified 03/14/2015, 02/21/2021, 02/21/2022   Moderna Sars-Covid-2 Vaccination 05/18/2019, 06/15/2019, 03/18/2020, 09/23/2020, 03/16/2022   Pneumococcal Conjugate-13 07/09/2014, 12/05/2015   Pneumococcal Polysaccharide-23 12/04/2016   Tdap 04/12/2018   Unspecified SARS-COV-2 Vaccination 01/27/2021, 10/03/2021, 03/16/2022   Pertinent  Health Maintenance Due  Topic Date Due   INFLUENZA VACCINE  Completed      02/26/2022    9:00 PM 02/27/2022    7:46 PM 02/28/2022    2:00 PM 02/28/2022  7:53 PM 03/01/2022    8:30 AM  Fall Risk  (RETIRED) Patient Fall Risk Level High fall risk High fall risk High fall risk High fall risk High fall risk   Functional Status Survey:    Vitals:   06/27/22 1450 06/27/22 1503  BP: (!) 131/91 132/78  Pulse: 85   Resp: 18   Temp: (!) 97.4 F (36.3 C)   SpO2: 93%   Weight: 162 lb (73.5 kg)   Height: 5' 9"$  (1.753 m)    Body mass index is 23.92 kg/m. Physical Exam Vitals reviewed.  Constitutional:      Comments: Patient is groaning with his eyes closed.   Cardiovascular:     Rate and Rhythm: Normal rate.  Pulmonary:     Comments: Intermittent increased work of breathing and panting Abdominal:     General: Bowel sounds are normal.     Palpations: Abdomen is soft.  Musculoskeletal:        General: No swelling.  Skin:    Comments: Extremities cold, bilateral feet and hands cyanotic     Labs reviewed: Recent Labs    02/23/22 0715 02/23/22 1359 02/25/22 0542  02/26/22 0653 02/28/22 0433 03/01/22 0625 03/26/22 0000 04/26/22 0000 05/31/22 0000  NA 126*   < > 129* 128* 127* 129* 140 136* 141  K 4.1   < > 3.5 3.3* 3.7 3.3* 4.5 5.0 4.5  CL 96*   < > 99 94* 91* 90* 108 102 109*  CO2 21*   < > 23 24 26 26 $ 23* 26* 19  GLUCOSE 101*   < > 109* 102* 100* 99  --   --   --   BUN 22   < > 20 18 28* 35* 39* 43* 36*  CREATININE 1.15   < > 1.20 1.30* 1.37* 1.49* 1.4* 1.8* 1.3  CALCIUM 8.9   < > 8.2* 8.3* 8.0* 8.3* 8.7 8.6* 9.6  MG 2.0   < > 1.9 1.8 1.8  --   --   --   --   PHOS 3.4  --   --   --   --   --   --   --   --    < > = values in this interval not displayed.   Recent Labs    02/23/22 0715 03/26/22 0000  AST 31 12*  ALT 29 12  ALKPHOS 90 74  BILITOT 0.9  --   PROT 7.2  --   ALBUMIN 3.7 6.0*   Recent Labs    02/24/22 0829 02/25/22 0542 02/26/22 0653 02/28/22 0433 03/26/22 0000 04/26/22 0000 05/31/22 0000  WBC 6.3 4.8 4.9 5.8 4.9 5.0 6.1  NEUTROABS 5.0  --   --   --  3,023.00 3,240.00  --   HGB 10.3* 9.4* 10.0* 9.7* 12.5* 12.6* 13.3*  HCT 31.4* 27.7* 29.3* 28.9* 37* 38* 40*  MCV 86.5 84.2 85.2 85.5  --   --   --   PLT 250 237 234 226 203 262 236   Lab Results  Component Value Date   TSH 8.962 (H) 02/23/2022   No results found for: "HGBA1C" No results found for: "CHOL", "HDL", "LDLCALC", "LDLDIRECT", "TRIG", "CHOLHDL"  Significant Diagnostic Results in last 30 days:  No results found.  Assessment/Plan Acute on chronic systolic CHF (congestive heart failure) ([place]) - Plan: Ambulatory referral to Hospice, haloperidol (HALDOL) 2 MG/ML solution  Hospice care Patient seen for acute concern of discomfort in the setting of CHF exacerbation likely  terminal in nature based on goals of care to only have comfort. Patient did not desire hospice care, however, his symptoms have been inadequately controlled with morphine and ativan alone. Plan to start schedule haldol 5 mg q6hr. If inadequate, hospice nurse is able to call physician on  call. Recommendation for second line is Phenobarbitol for palliative sedation under the circumstances. Updated provider who will be present in the upcoming days. Notified family of eminent passing. Chaplain also notified to help with aiding in comfort. Playing patient's favorite music.   Family/ staff Communication: Nursing, Hospice nursing/physician, daughter and HCPOA   Labs/tests ordered:  none   I spent >45 minutes for the care of this patient with consultation, care coordination, and face to face time.

## 2022-07-06 DEATH — deceased

## 2022-08-01 ENCOUNTER — Ambulatory Visit: Payer: Medicare Other | Admitting: Dermatology
